# Patient Record
Sex: Male | Born: 1935 | Race: White | Hispanic: No | Smoking: Former smoker
Health system: Southern US, Community
[De-identification: ages and names within clinical notes are randomized; demographics above are authoritative.]

## PROBLEM LIST (undated history)

## (undated) DIAGNOSIS — IMO0001 Reserved for inherently not codable concepts without codable children: Secondary | ICD-10-CM

## (undated) DIAGNOSIS — M545 Low back pain, unspecified: Secondary | ICD-10-CM

## (undated) DIAGNOSIS — F419 Anxiety disorder, unspecified: Secondary | ICD-10-CM

## (undated) DIAGNOSIS — M549 Dorsalgia, unspecified: Secondary | ICD-10-CM

## (undated) DIAGNOSIS — R296 Repeated falls: Secondary | ICD-10-CM

## (undated) DIAGNOSIS — J189 Pneumonia, unspecified organism: Secondary | ICD-10-CM

## (undated) DIAGNOSIS — Z9289 Personal history of other medical treatment: Secondary | ICD-10-CM

## (undated) DIAGNOSIS — E119 Type 2 diabetes mellitus without complications: Secondary | ICD-10-CM

## (undated) DIAGNOSIS — Z7901 Long term (current) use of anticoagulants: Secondary | ICD-10-CM

## (undated) DIAGNOSIS — N183 Chronic kidney disease, stage 3 unspecified: Secondary | ICD-10-CM

## (undated) DIAGNOSIS — H9190 Unspecified hearing loss, unspecified ear: Secondary | ICD-10-CM

## (undated) DIAGNOSIS — R351 Nocturia: Secondary | ICD-10-CM

## (undated) DIAGNOSIS — E785 Hyperlipidemia, unspecified: Secondary | ICD-10-CM

## (undated) DIAGNOSIS — R202 Paresthesia of skin: Secondary | ICD-10-CM

## (undated) DIAGNOSIS — I2510 Atherosclerotic heart disease of native coronary artery without angina pectoris: Secondary | ICD-10-CM

## (undated) DIAGNOSIS — I480 Paroxysmal atrial fibrillation: Secondary | ICD-10-CM

## (undated) DIAGNOSIS — R3915 Urgency of urination: Secondary | ICD-10-CM

## (undated) DIAGNOSIS — J984 Other disorders of lung: Secondary | ICD-10-CM

## (undated) DIAGNOSIS — Z86718 Personal history of other venous thrombosis and embolism: Secondary | ICD-10-CM

## (undated) DIAGNOSIS — W19XXXA Unspecified fall, initial encounter: Secondary | ICD-10-CM

## (undated) DIAGNOSIS — R200 Anesthesia of skin: Secondary | ICD-10-CM

## (undated) DIAGNOSIS — C61 Malignant neoplasm of prostate: Secondary | ICD-10-CM

## (undated) DIAGNOSIS — M79605 Pain in left leg: Secondary | ICD-10-CM

## (undated) DIAGNOSIS — I340 Nonrheumatic mitral (valve) insufficiency: Secondary | ICD-10-CM

## (undated) DIAGNOSIS — R011 Cardiac murmur, unspecified: Secondary | ICD-10-CM

## (undated) DIAGNOSIS — L97909 Non-pressure chronic ulcer of unspecified part of unspecified lower leg with unspecified severity: Secondary | ICD-10-CM

## (undated) DIAGNOSIS — J449 Chronic obstructive pulmonary disease, unspecified: Secondary | ICD-10-CM

## (undated) DIAGNOSIS — M1990 Unspecified osteoarthritis, unspecified site: Secondary | ICD-10-CM

## (undated) DIAGNOSIS — K219 Gastro-esophageal reflux disease without esophagitis: Secondary | ICD-10-CM

## (undated) DIAGNOSIS — I739 Peripheral vascular disease, unspecified: Secondary | ICD-10-CM

## (undated) DIAGNOSIS — G8929 Other chronic pain: Secondary | ICD-10-CM

## (undated) DIAGNOSIS — J8410 Pulmonary fibrosis, unspecified: Secondary | ICD-10-CM

## (undated) DIAGNOSIS — M5430 Sciatica, unspecified side: Secondary | ICD-10-CM

## (undated) DIAGNOSIS — M546 Pain in thoracic spine: Secondary | ICD-10-CM

## (undated) HISTORY — PX: PROSTATE CRYOABLATION: SUR358

## (undated) HISTORY — DX: Chronic kidney disease, stage 3 unspecified: N18.30

## (undated) HISTORY — PX: CARDIAC CATHETERIZATION: SHX172

## (undated) HISTORY — DX: Personal history of other venous thrombosis and embolism: Z86.718

## (undated) HISTORY — DX: Repeated falls: R29.6

## (undated) HISTORY — DX: Chronic kidney disease, stage 3 (moderate): N18.3

## (undated) HISTORY — DX: Unspecified fall, initial encounter: W19.XXXA

## (undated) HISTORY — PX: CATARACT EXTRACTION W/ INTRAOCULAR LENS  IMPLANT, BILATERAL: SHX1307

## (undated) HISTORY — PX: CORONARY ANGIOPLASTY WITH STENT PLACEMENT: SHX49

---

## 1996-01-12 HISTORY — PX: CARDIAC CATHETERIZATION: SHX172

## 1998-03-31 ENCOUNTER — Encounter: Admission: RE | Admit: 1998-03-31 | Discharge: 1998-04-28 | Payer: Self-pay | Admitting: Internal Medicine

## 2001-02-01 ENCOUNTER — Encounter: Admission: RE | Admit: 2001-02-01 | Discharge: 2001-02-01 | Payer: Self-pay | Admitting: Cardiology

## 2001-02-01 ENCOUNTER — Encounter: Payer: Self-pay | Admitting: Cardiology

## 2001-02-07 ENCOUNTER — Ambulatory Visit (HOSPITAL_COMMUNITY): Admission: RE | Admit: 2001-02-07 | Discharge: 2001-02-07 | Payer: Self-pay | Admitting: Cardiology

## 2001-09-05 ENCOUNTER — Encounter: Payer: Self-pay | Admitting: Internal Medicine

## 2001-09-05 ENCOUNTER — Encounter: Admission: RE | Admit: 2001-09-05 | Discharge: 2001-09-05 | Payer: Self-pay | Admitting: Internal Medicine

## 2002-05-11 ENCOUNTER — Ambulatory Visit (HOSPITAL_COMMUNITY): Admission: RE | Admit: 2002-05-11 | Discharge: 2002-05-11 | Payer: Self-pay | Admitting: Gastroenterology

## 2002-05-11 ENCOUNTER — Encounter (INDEPENDENT_AMBULATORY_CARE_PROVIDER_SITE_OTHER): Payer: Self-pay | Admitting: Specialist

## 2003-01-28 ENCOUNTER — Encounter: Admission: RE | Admit: 2003-01-28 | Discharge: 2003-01-28 | Payer: Self-pay | Admitting: Internal Medicine

## 2003-08-12 ENCOUNTER — Encounter: Admission: RE | Admit: 2003-08-12 | Discharge: 2003-08-12 | Payer: Self-pay | Admitting: Cardiology

## 2004-04-09 ENCOUNTER — Ambulatory Visit: Admission: RE | Admit: 2004-04-09 | Discharge: 2004-04-21 | Payer: Self-pay | Admitting: Radiation Oncology

## 2004-06-03 ENCOUNTER — Ambulatory Visit: Admission: RE | Admit: 2004-06-03 | Discharge: 2004-09-11 | Payer: Self-pay | Admitting: Radiation Oncology

## 2004-08-03 ENCOUNTER — Ambulatory Visit (HOSPITAL_BASED_OUTPATIENT_CLINIC_OR_DEPARTMENT_OTHER): Admission: RE | Admit: 2004-08-03 | Discharge: 2004-08-03 | Payer: Self-pay | Admitting: Urology

## 2004-08-03 ENCOUNTER — Ambulatory Visit (HOSPITAL_COMMUNITY): Admission: RE | Admit: 2004-08-03 | Discharge: 2004-08-03 | Payer: Self-pay | Admitting: Urology

## 2004-08-03 HISTORY — PX: INSERTION PROSTATE RADIATION SEED: SUR718

## 2004-10-05 ENCOUNTER — Encounter: Admission: RE | Admit: 2004-10-05 | Discharge: 2004-10-05 | Payer: Self-pay | Admitting: Cardiology

## 2004-10-09 ENCOUNTER — Ambulatory Visit (HOSPITAL_COMMUNITY): Admission: RE | Admit: 2004-10-09 | Discharge: 2004-10-10 | Payer: Self-pay | Admitting: Cardiology

## 2005-07-07 ENCOUNTER — Inpatient Hospital Stay (HOSPITAL_COMMUNITY): Admission: AD | Admit: 2005-07-07 | Discharge: 2005-07-09 | Payer: Self-pay | Admitting: Cardiology

## 2005-09-15 ENCOUNTER — Ambulatory Visit (HOSPITAL_COMMUNITY): Admission: RE | Admit: 2005-09-15 | Discharge: 2005-09-15 | Payer: Self-pay | Admitting: Cardiology

## 2005-09-15 HISTORY — PX: CARDIOVERSION: SHX1299

## 2006-01-13 ENCOUNTER — Encounter: Admission: RE | Admit: 2006-01-13 | Discharge: 2006-01-13 | Payer: Self-pay | Admitting: Family Medicine

## 2006-01-20 ENCOUNTER — Encounter: Admission: RE | Admit: 2006-01-20 | Discharge: 2006-01-20 | Payer: Self-pay | Admitting: Family Medicine

## 2006-02-03 ENCOUNTER — Encounter: Admission: RE | Admit: 2006-02-03 | Discharge: 2006-02-03 | Payer: Self-pay | Admitting: Family Medicine

## 2006-02-17 ENCOUNTER — Encounter: Admission: RE | Admit: 2006-02-17 | Discharge: 2006-02-17 | Payer: Self-pay | Admitting: Family Medicine

## 2006-04-05 ENCOUNTER — Encounter: Admission: RE | Admit: 2006-04-05 | Discharge: 2006-04-05 | Payer: Self-pay | Admitting: Cardiology

## 2006-05-05 ENCOUNTER — Encounter: Admission: RE | Admit: 2006-05-05 | Discharge: 2006-05-05 | Payer: Self-pay | Admitting: Internal Medicine

## 2006-06-01 ENCOUNTER — Encounter: Admission: RE | Admit: 2006-06-01 | Discharge: 2006-06-01 | Payer: Self-pay | Admitting: Orthopedic Surgery

## 2007-06-14 ENCOUNTER — Ambulatory Visit (HOSPITAL_COMMUNITY): Admission: RE | Admit: 2007-06-14 | Discharge: 2007-06-14 | Payer: Self-pay | Admitting: Gastroenterology

## 2007-06-14 ENCOUNTER — Encounter (INDEPENDENT_AMBULATORY_CARE_PROVIDER_SITE_OTHER): Payer: Self-pay | Admitting: Gastroenterology

## 2007-07-26 HISTORY — PX: TRANSTHORACIC ECHOCARDIOGRAM: SHX275

## 2007-11-14 ENCOUNTER — Encounter: Admission: RE | Admit: 2007-11-14 | Discharge: 2007-11-14 | Payer: Self-pay | Admitting: Internal Medicine

## 2008-05-23 ENCOUNTER — Encounter: Admission: RE | Admit: 2008-05-23 | Discharge: 2008-05-23 | Payer: Self-pay | Admitting: Cardiology

## 2008-06-03 HISTORY — PX: CARDIOVASCULAR STRESS TEST: SHX262

## 2008-08-05 ENCOUNTER — Encounter: Admission: RE | Admit: 2008-08-05 | Discharge: 2008-08-05 | Payer: Self-pay | Admitting: Neurosurgery

## 2008-08-06 ENCOUNTER — Ambulatory Visit (HOSPITAL_COMMUNITY): Admission: RE | Admit: 2008-08-06 | Discharge: 2008-08-06 | Payer: Self-pay | Admitting: General Surgery

## 2008-08-06 ENCOUNTER — Encounter (INDEPENDENT_AMBULATORY_CARE_PROVIDER_SITE_OTHER): Payer: Self-pay | Admitting: General Surgery

## 2008-08-06 HISTORY — PX: OTHER SURGICAL HISTORY: SHX169

## 2008-10-28 ENCOUNTER — Ambulatory Visit (HOSPITAL_COMMUNITY): Admission: RE | Admit: 2008-10-28 | Discharge: 2008-10-28 | Payer: Self-pay | Admitting: Neurosurgery

## 2008-10-28 HISTORY — PX: LAMINECTOMY: SHX219

## 2010-03-03 ENCOUNTER — Other Ambulatory Visit: Payer: Self-pay | Admitting: Internal Medicine

## 2010-03-03 ENCOUNTER — Ambulatory Visit
Admission: RE | Admit: 2010-03-03 | Discharge: 2010-03-03 | Disposition: A | Discharge status: Home / Self Care | Payer: Medicare Other | Source: Ambulatory Visit | Attending: Internal Medicine | Admitting: Internal Medicine

## 2010-03-03 DIAGNOSIS — R0602 Shortness of breath: Secondary | ICD-10-CM

## 2010-04-16 LAB — BASIC METABOLIC PANEL
BUN: 14 mg/dL (ref 6–23)
CO2: 23 mEq/L (ref 19–32)
Chloride: 105 mEq/L (ref 96–112)
Glucose, Bld: 175 mg/dL — ABNORMAL HIGH (ref 70–99)
Sodium: 138 mEq/L (ref 135–145)

## 2010-04-16 LAB — DIFFERENTIAL
Basophils Relative: 1 % (ref 0–1)
Eosinophils Absolute: 0.2 10*3/uL (ref 0.0–0.7)
Lymphocytes Relative: 25 % (ref 12–46)
Neutro Abs: 4.7 10*3/uL (ref 1.7–7.7)

## 2010-04-16 LAB — CBC
HCT: 37.8 % — ABNORMAL LOW (ref 39.0–52.0)
RBC: 3.78 MIL/uL — ABNORMAL LOW (ref 4.22–5.81)
RDW: 12.7 % (ref 11.5–15.5)

## 2010-04-16 LAB — TYPE AND SCREEN
ABO/RH(D): A NEG
Antibody Screen: NEGATIVE

## 2010-04-16 LAB — PROTIME-INR
INR: 2.66 — ABNORMAL HIGH (ref 0.00–1.49)
Prothrombin Time: 28.1 seconds — ABNORMAL HIGH (ref 11.6–15.2)

## 2010-04-16 LAB — APTT: aPTT: 45 seconds — ABNORMAL HIGH (ref 24–37)

## 2010-04-16 LAB — ABO/RH: ABO/RH(D): A NEG

## 2010-04-19 LAB — COMPREHENSIVE METABOLIC PANEL
Alkaline Phosphatase: 74 U/L (ref 39–117)
BUN: 21 mg/dL (ref 6–23)
CO2: 24 mEq/L (ref 19–32)
Chloride: 107 mEq/L (ref 96–112)
GFR calc non Af Amer: 55 mL/min — ABNORMAL LOW (ref >60)
Glucose, Bld: 138 mg/dL — ABNORMAL HIGH (ref 70–99)
Potassium: 3.9 mEq/L (ref 3.5–5.1)
Total Bilirubin: 0.7 mg/dL (ref 0.3–1.2)

## 2010-04-19 LAB — CBC
HCT: 38.3 % — ABNORMAL LOW (ref 39.0–52.0)
Hemoglobin: 13.3 g/dL (ref 13.0–17.0)
MCV: 98.5 fL (ref 78.0–100.0)
WBC: 6.6 10*3/uL (ref 4.0–10.5)

## 2010-04-19 LAB — DIFFERENTIAL
Basophils Absolute: 0 10*3/uL (ref 0.0–0.1)
Basophils Relative: 0 % (ref 0–1)
Monocytes Absolute: 0.6 10*3/uL (ref 0.1–1.0)
Neutro Abs: 4.4 10*3/uL (ref 1.7–7.7)
Neutrophils Relative %: 66 % (ref 43–77)

## 2010-04-19 LAB — PROTIME-INR
INR: 2 — ABNORMAL HIGH (ref 0.00–1.49)
Prothrombin Time: 15 seconds (ref 11.6–15.2)
Prothrombin Time: 23.9 seconds — ABNORMAL HIGH (ref 11.6–15.2)

## 2010-05-26 NOTE — Op Note (Signed)
Greg Booker, Greg Booker                ACCOUNT NO.:  [id number]   MEDICAL RECORD NO.:  [id number]          PATIENT TYPE:  AMB   LOCATION:  DAY                          FACILITY:  [facility]   PHYSICIAN:  Adolph Pollack, M.D.DATE OF BIRTH:  05-22-1929   DATE OF PROCEDURE:  08/06/2008  DATE OF DISCHARGE:                               OPERATIVE REPORT   PREOPERATIVE DIAGNOSIS:  Left breast mass.   POSTOPERATIVE DIAGNOSIS:  Left breast mass.   PROCEDURE:  Excision of left breast mass (left partial mastectomy).   SURGEON:  Adolph Pollack, M.D.   ANESTHESIA:  Local (mixture of lidocaine and Marcaine) with MAC.   INDICATIONS:  [name] is a 77 year old male with a left breast mass.  Very likely it is gynecomastia as he had a similar area in his right  breast removed.  However, he does have a sister who has left breast  cancer.  The mass is painful and measures at least 3.3 cm.  It is based  in the superior to the aspect of the breast and the nipple areolar  complex.  He now presents for excision.  We discussed the procedure and  risks preoperatively.  He had been on Coumadin, but he had held this,  and his INR is normal.   TECHNIQUE:  He was seen in the holding area and the left chest wall  marked with my initials.  He was then brought to the operating room,  placed supine on the operating table and intravenous sedation was given.  The hair on the left chest was clipped, and the left chest/breast was  sterilely prepped and draped.  Local anesthetic was infiltrated in the  circumareolar area beginning at approximately the 7 o'clock position and  going clockwise in the 5 o'clock position and radially from the nipple  areolar complex.  I then made circumareolar incision beginning at about  8 o'clock position and going around to the 4 o'clock position.  I raised  skin flaps superiorly and laterally.  I then began dissecting breast  tissue off of the chest wall with electrocautery.  I then  separated  breast tissue from the nipple areolar complex.  I took this down to the  chest wall as well.  I then excised the rest of the mass  off of the  chest wall using electrocautery.  This turned into a partial mastectomy.  I excised normal tissue around the area of the mass.   The specimen sent to pathology.  The wound was inspected, and bleeding  points were controlled with electrocautery.  Once hemostasis was  adequate, I reapproximated the subcutaneous tissues with interrupted 3-0  Vicryl suture.  The skin was closed with a 4-0 Monocryl running  subcuticular stitch.  Steri-Strips and sterile dressings were applied.   He tolerated the procedure well without any apparent complications and  was taken to recovery in satisfactory condition.      Adolph Pollack, M.D.  Electronically Signed     TJR/MEDQ  D:  08/06/2008  T:  08/06/2008  Job:  [id number]

## 2010-05-26 NOTE — Op Note (Signed)
NAME:  Greg Booker, Greg Booker                ACCOUNT NO.:  [id number]   MEDICAL RECORD NO.:  [id number]          PATIENT TYPE:  AMB   LOCATION:  ENDO                         FACILITY:  [facility]   PHYSICIAN:  Anselmo Rod, M.D.  DATE OF BIRTH:  1929-01-16   DATE OF PROCEDURE:  06/14/2007  DATE OF DISCHARGE:                               OPERATIVE REPORT   PROCEDURE PERFORMED:  Colonoscopy with multiple cold biopsies.   ENDOSCOPIST:  Anselmo Rod, M.D.   INSTRUMENT USED:  Pentax video colonoscope.   INDICATIONS FOR PROCEDURE:  A 77 year old white male with multiple  medical problems undergoing colonoscopy.  The patient has a history of  adenomatous polyps removed in 2004, rule out recurrent polyps, masses,  etc.   PREPROCEDURE PREPARATION:  Informed consent was procured from the  patient.  The patient was fasted for 8 hours prior to the procedure and  prepped with a bottle of magnesium citrate and a gallon of NuLYTELY the  night prior to the procedure.  The patient's Coumadin was held for a  week prior to the procedure after procuring permission from his  cardiologist, Dr. Julieanne Manson, to do so.  Risks and benefits of the  procedure including a 10% miss rate of cancer and polyp were discussed  with the patient as well.   PREPROCEDURE PHYSICAL:  VITAL SIGNS:  The patient had stable vital  signs.  The patient had irregular regular rhythm and has a history of  atrial fibrillation.  NECK:  Supple.  CHEST:  Clear to auscultation.  CARDIAC:  S1 and S2 irregular.  ABDOMEN:  Soft with normal bowel sounds.   DESCRIPTION OF PROCEDURE:  The patient was placed in the left lateral  decubitus position and sedated with 75 mcg of Fentanyl and 6 mg of  Versed given intravenously in slow incremental doses. Once the patient  was adequately sedated and maintained on low-flow oxygen, the Pentax  video colonoscope was advanced from the rectum to the cecum and terminal  ileum without difficulty.  There  was some residual stool in the colon.  Multiple washes were done.  Three small sessile polyps were removed by  cold biopsies from the cecum and the appendiceal orifice and ileocecal  valve were clearly visualized and photographed.  The terminal ileum  appeared healthy and without lesions.  Two small sessile polyps were  removed by cold biopsies from the mid right colon.  The transverse colon  and the left colon appeared normal as did the rectosigmoid colon and the  rectum.  Retroflexion in the rectum revealed small internal hemorrhoids.  The patient tolerated the procedure well without immediate  complications.   IMPRESSION:  1. Three small sessile polyps removed by cold biopsies from the cecum      and two small sessile polyps removed by cold biopsies from the mid      right colon.  2. Normal terminal ileum.  3. No evidence of diverticulosis.  4. Small internal hemorrhoids seen on retroflexion.   RECOMMENDATIONS:  1. Await pathology results.  2. Repeat colonoscopy depending on pathology results.  3.  Avoid Coumadin for the next one week and restart Coumadin      thereafter, on day 8.  4. Continue high fiber diet with liberal fluid intake.  5. Outpatient follow-up as need arises in the future.      Anselmo Rod, M.D.  Electronically Signed     JNM/MEDQ  D:  06/14/2007  T:  06/14/2007  Job:  [id number]   cc:   Merlene Laughter. Renae Gloss, M.D.  Fax: [phone]   Thereasa Solo. Little, M.D.  Fax: [phone]

## 2010-05-29 NOTE — Op Note (Signed)
NAME:  CHASON, MCIVER                          ACCOUNT NO.:  [id number]   MEDICAL RECORD NO.:  [id number]                   PATIENT TYPE:  AMB   LOCATION:  ENDO                                 FACILITY:  [facility]   PHYSICIAN:  Anselmo Rod, M.D.               DATE OF BIRTH:  1929/10/11   DATE OF PROCEDURE:  05/11/2002  DATE OF DISCHARGE:                                 OPERATIVE REPORT   PROCEDURE:  Colonoscopy with snare polypectomy x2.   ENDOSCOPIST:  Anselmo Rod, M.D.   INSTRUMENT USED:  Olympus video colonoscope.   INDICATION FOR PROCEDURE:  A 77 year old white gentleman with a history of  guaiac-positive stools.  Rule out colonic polyps, masses, etc.   PREPROCEDURE PREPARATION:  Informed consent was procured from the patient.  The patient fasted for eight hours prior to the procedure and prepped with a  bottle of magnesium citrate and a gallon of GoLYTELY the night prior to the  procedure.   PREPROCEDURE PHYSICAL:  VITAL SIGNS:  The patient had stable vital signs.  NECK:  Supple.  CHEST:  Clear to auscultation.  S1, S2 regular.  ABDOMEN:  Soft with normal bowel sounds.   DESCRIPTION OF PROCEDURE:  The patient was placed in the left lateral  decubitus position and sedated with 60 mg of Demerol and 6 mg of Versed  intravenously.  Once the patient was adequately sedate and maintained on low-  flow oxygen and continuous cardiac monitoring, the Olympus video colonoscope  was advanced from the rectum to the cecum with difficulty.  There was a  large amount of residual stool in the right colon.  Multiple washes were  done, the patient's position was changed from the left lateral to the supine  position to adequately visualize the cecal base.  In spite of that, there  was some residual stool in the cecum.  A small sessile polyp was snared from  the cecum that was lost in stool and could not be retrieved.  A 5-6 mm  sessile polyp was snared from the proximal right colon and  was retrieved.  There was streaky erythema with bleeding in the right colon, probably from  application of pressure while advancing the scope from the left colon to the  right colon; however, no large masses or polyps were seen.  Retroflexion in  the rectum revealed small, nonbleeding internal hemorrhoids.   IMPRESSION:  1. Small, nonbleeding internal hemorrhoid.  2. Small sessile polyp snared from cecum (lost in stool).  3. A 5-6 mm sessile polyp snared from the proximal right colon.  4. Streaky erythema with minimal hemorrhage in the right colon from possible     application of pressure during advancement of the scope.    RECOMMENDATIONS:  1. Await pathology results.  2. Avoid all nonsteroidals, including aspirin, for the next four weeks.  3. Outpatient follow-up in the next two weeks for  further recommendations.                                               Anselmo Rod, M.D.    JNM/MEDQ  D:  05/11/2002  T:  05/11/2002  Job:  [id number]   cc:   Merlene Laughter. Renae Gloss, M.D.  [address]  [address]  [place]  [place] [address]  Fax: [phone]

## 2010-05-29 NOTE — Cardiovascular Report (Signed)
Greg Booker, Greg Booker                ACCOUNT NO.:  [id number]   MEDICAL RECORD NO.:  [id number]          PATIENT TYPE:  OIB   LOCATION:  2854                         FACILITY:  [facility]   PHYSICIAN:  Thereasa Solo. Little, M.D. DATE OF BIRTH:  [birth year]   DATE OF PROCEDURE:  10/09/2004  DATE OF DISCHARGE:                              CARDIAC CATHETERIZATION   INDICATIONS FOR TEST:  This 77 year old male has a history of coronary  disease dating back to 1997; at that time, a stent was placed into his RCA.  He has chronic paroxysmal atrial fibrillation, but has been in a sinus  rhythm lately.   He was diagnosed with prostate cancer the first of 2006 and presented to my  office on September 24, 2004 complaining of increasing tiredness and  fatigue, no stamina.  His EKG shows more lateral T wave changes and because  of these complaints associated with EKG changes, he is brought in for  outpatient cardiac catheterization.  He stopped his Coumadin as an  outpatient.   DESCRIPTION OF PROCEDURE:  After obtaining informed consent, the patient was  prepped and draped in the usual sterile fashion, exposing the right groin.  Following local anesthetic with 1% Xylocaine, the Seldinger technique was  employed and a 5-French introducer sheath was placed into the right femoral  artery.  Left and right coronary arteriography and ventriculography in the  RAO projection were performed.   COMPLICATIONS:  None.   EQUIPMENT:  The 5-French Judkins configurations.  It should be pointed out  that the x-ray equipment before the ventriculogram and immediately after the  ventriculogram went down as result to what appears to be a power surge.  The data was retrieved.   TOTAL CONTRAST:  110 mL.   HEMODYNAMIC MONITORING:  Central aortic pressure was 168/76.  Left  ventricular pressure was 167/8.  At the time of pullback. there was no  significant gradient noted.   VENTRICULOGRAPHY:  Ventriculography in the RAO  projection revealed an  ejection fraction of approximately 50%.  There was posterior basilar segment  hypokinesis; +2 mitral regurgitation was seen.  The left ventricular end-  diastolic pressure was 13.   CORONARY ARTERIOGRAPHY:  On fluoroscopy, dense calcification was seen in the  proximal portion of the LAD and in the left main.  1.  Left main normal; it bifurcated.  2.  LAD:  The LAD extended down and crossed the apex of the heart.  It gave      rise to a large left-to-right collateral bed.  The proximal LAD had      minor irregularities of less than 40%.  The ostium of the first diagonal      had minor narrowing.  The remainder showed no significant disease.  3.  Circumflex:  The circumflex gave rise to 1 large OM vessel and the OM      vessel itself was free of disease.  Proximal to the OM vessel was a      focal area of narrowing that was less than 50%.  4.  Right coronary artery:  On fluoroscopy, a  stent was noted in what would      be the midportion of the RCA.  The vessel, however, was occluded 100%      proximally.  There were left-to-right collaterals filling the entire      posterior descending artery.   CONCLUSION:  1.  Ejection fraction 50% with posterobasilar hypokinesis.  2.  Mitral regurgitation, +2.  3.  One hundred percent occlusion of the right coronary artery with mild      disease in both the left anterior descending and circumflex, but no high-      grade flow-limiting obstructions.   I cannot clearly explain his fatigue based on his catheterization.  There  are no high-grade stenoses and large collaterals to the right coronary  artery should not substantially affect his stamina.  His left ventricular  function at 50% should be adequate.  I plan to add low-dose ACE inhibitors  to his current medications and because his paroxysmal atrial fibrillation,  it was plan to restart his Coumadin on October 10, 2004.  He will be  checked in the office on October 12, 2004 and at that time the ACE inhibitor  will be started (lisinopril 10 mg).           ______________________________  Thereasa Solo Little, M.D.     ABL/MEDQ  D:  10/09/2004  T:  10/09/2004  Job:  [id number]   cc:   Merlene Laughter. Renae Gloss, M.D.  Fax: [phone]   [facility]

## 2010-05-29 NOTE — Cardiovascular Report (Signed)
[facility]  Patient:    Greg Booker, Greg Booker Visit Number: [id number] MRN: [id number]          Service Type: CAT Location: [facility] 2855 [id number] Attending Physician:  Loreli Dollar Dictated by:   Julieanne Manson, M.D. Proc. Date: 02/07/01 Admit Date:  02/07/2001   CC:         Cala Bradford R. Renae Gloss, M.D.             Cardiac Catheterization Lab                        Cardiac Catheterization  INDICATIONS:  [name] is a 77 year old male who had a stent placed to his RCA in 1997, and had in-stent restenosis and basically subtotal occlusion of the entire RCA (documented by catheterization in 1998).  He has had intermittent paroxysmal atrial fibrillation and of recent has had more atrial fibrillation.  He underwent a stress Cardiolite study because of increasing dyspnea on exertion, that showed inferior ischemia; because of this is brought to the catheterization lab for outpatient cardiac catheterization.  DESCRIPTION OF PROCEDURE:  The patient was prepped and draped in the usual sterile fashion, exposing the right groin.  Following local anesthetic with 1% Xylocaine, the Seldinger technique was employed and a 5-French introducer sheath was placed into the right femoral artery.  Selective left and right coronary arteriography and ventriculography in the RAO projection was performed.  RESULTS:  HEMODYNAMIC MONITORING: 1. Central aortic pressure:  163/69. 2. Left ventricular pressure:  163/53, with no significant aortic valve    gradient noted at time of pullback.  VENTRICULOGRAPHY:  Performed in the RAO projection, revealed normal left ventricular systolic function.  There was mitral regurgitation -- mild.  The end-diastolic pressure was 20.  CORONARY ARTERIOGRAPHY:  On fluoroscopy there was dense calcification in the left main and proximal LAD. 1. LEFT MAIN:  Normal. 2. LEFT ANTERIOR DESCENDING:  Had mild to moderate proximal irregularities,    all  less than 50%.  The distal vessel was small, with mild irregularities.    There was a small first diagonal branch. 3. CIRCUMFLEX:  Gave rise to one large OM vessel, with mild irregularities.    The AV groove circumflex was small. 4. RIGHT CORONARY ARTERY:  Had a long 80-90% area of obstruction from the mid    to the distal portion of the vessel, with bidirectional flow of the distal    portion of the vessel.  There was a large left-to-right collateral bed    noted.  ASSESSMENT:  In comparison to the 1998 cardiac catheterization, the RCA has not changed.  There is slightly worsening irregularities in the LAD, but nothing that is high grade and that warrants intervention at this time.Dictated by:   Julieanne Manson, M.D. Attending Physician:  Loreli Dollar DD:  02/07/01 TD:  02/07/01 Job: [id number]

## 2010-05-29 NOTE — Op Note (Signed)
Greg Booker, Greg Booker                ACCOUNT NO.:  [id number]   MEDICAL RECORD NO.:  [id number]          PATIENT TYPE:  AMB   LOCATION:  [facility]                         FACILITY:  [facility]   PHYSICIAN:  Bertram Millard. Dahlstedt, M.D.DATE OF BIRTH:  [birth year]   DATE OF PROCEDURE:  08/03/2004  DATE OF DISCHARGE:                                 OPERATIVE REPORT   PREOPERATIVE DIAGNOSIS:  Adenocarcinoma the prostate,  clinical stage TIC,  Gleason score 4+4.   POSTOPERATIVE DIAGNOSIS:  Adenocarcinoma the prostate,  clinical stage TIC,  Gleason score 4+4.   PROCEDURE:  Placement of I-125 seeds within prostate.   SURGEON:  Bertram Millard. Dahlstedt, M.D.   RADIATION ONCOLOGIST:  Artist Pais. Kathrynn Running, M.D.   ANESTHESIA:  General.   COMPLICATIONS:  None.   TOTAL NUMBER OF NEEDLES:  19   TOTAL NUMBER OF SEEDS:  61.   ACTIVITY:  0.443 mCi per seed.   BRIEF HISTORY:  This 77 year old gentleman presents at this time for  brachytherapy. He was diagnosed this spring with adenocarcinoma of the  prostate. He had a PSA that rose from 3.2 to over 7 in a period of a year.  Biopsy was performed which revealed a Gleason's 4+4, and 40% of bilateral  biopsies were positive for adenocarcinoma.   He has received [facility] agonist, and has completed a course of external beam  radiotherapy as part of his combination treatment. He presents at this time  for completion of radiation therapy with brachytherapy. He is aware of the  risks and complications of the procedure including but not limited to  retention, blood in the urine, blood in the stool, secondary malignancies,  among others. He consented to proceed.   DESCRIPTION OF PROCEDURE:  The patient was administered preoperative IV  antibiotics and was taken to the operating room where general anesthetic was  administered. He was placed in the dorsal lithotomy position. The Foley  catheter was placed in the bladder with contrast in the balloon. His  perineum was then  prepped and draped. The transrectal ultrasound probe was  then introduced. The prostate was scanned by the Nucletron device. The grid  was placed against the patient's perineum, hooked to the Nucletron device.  Fixation needles were placed in prechosen locations. The urethra and rectum  were carefully measured, and the prostate also measured with the program.  Urethra and rectum were omitted from the treatment. Based on the planned  grid designed by the software, the prostate seeds were then placed. Again,  19 needles were used to deliver 61 seeds using ultrasonographic guidance.  Following placement of all seeds, the catheter was removed. Cystoscopic view  of the urethra and bladder revealed no seeds evident. At this point a Foley  catheter was replaced.   At this point, the procedure was terminated. The patient was awakened and  taken to PACU in stable condition.   He will follow with doctors Kathrynn Running and Dahlstedt in three weeks. He was  discharged on Keflex 500 mg one p.o. t.i.d. for 5 days and Vicodin one p.o.  q.4 h p.r.n. pain. He was given  instructions on catheter removal.       SMD/MEDQ  D:  08/03/2004  T:  08/03/2004  Job:  [id number]   cc:   Merlene Laughter. Renae Gloss, M.D.  [address]  [address]  [place]  [place] [address]  Fax: [phone]   Artist Pais. Kathrynn Running, M.D.  [address] [facility]  [place]  [place] [address]  Fax: [phone]

## 2010-05-29 NOTE — Discharge Summary (Signed)
Greg Booker, Greg Booker                ACCOUNT NO.:  [id number]   MEDICAL RECORD NO.:  [id number]          PATIENT TYPE:  INP   LOCATION:  2018                         FACILITY:  [facility]   PHYSICIAN:  Thereasa Solo. Little, M.D. DATE OF BIRTH:  1929/07/25   DATE OF ADMISSION:  07/07/2005  DATE OF DISCHARGE:  07/09/2005                                 DISCHARGE SUMMARY   DISCHARGE DIAGNOSES:  1.  Paroxysmal atrial fibrillation, admitted for institution of sotalol      therapy, now in sinus rhythm at the time of discharge.  2.  Coumadin anticoagulation therapy - therapeutic.  3.  Known coronary artery disease, with last catheterization in September      2006 with normal ejection fraction.  4.  Hypertension.  5.  Hyperlipidemia with STATIN intolerance.   HPI/HOSPITAL COURSE:  This is a 77 year old gentleman, patient of Dr.  Clarene Duke, who has known history of paroxysmal atrial fibrillation that dates  back at least 15 years, and he was taking quinidine for rhythm control and  had only occasional breakthrough episodes, but last Saturday prior to his  admission the patient experienced severe episode of atrial palpitations when  he had felt like his heart beating exceptionally fast, and he had some chest  discomfort with radiation to both arms, which required taking nitroglycerin.  Eventually, the pain dissipated when his rate subsided.   Dr. Clarene Duke admitted the patient to [facility] for initiation of  sotalol therapy.  His Coumadin was discontinued, and we started him on  sotalol 40 mg twice a day.   His INR remained therapeutic.  At the time of discharge, was 2.3.  Digitalis  level was 2.5.  Hemoglobin 11.4, hematocrit 33.3, white blood cell count  5.5, platelets 196.  Sodium 138, potassium 4.6, BUN 25, creatinine 1.4.   On the day of discharge, Dr. Clarene Duke saw patient, and he was stable from  cardiovascular standpoint, maintained sinus rhythm.  Vital signs were  normal.   DISCHARGE  MEDICATIONS:  1.  Lisinopril 5 mg daily.  2.  Zoloft 50 mg daily.  3.  Coumadin 5 mg daily, except 2.5 Monday, Wednesday, Friday.  4.  Imdur 60 mg daily.  5.  Protonix 40 mg daily.  6.  Flomax 0.4 mg daily.  7.  Betapace 40 mg b.i.d.   DISCHARGE INSTRUCTIONS:  The patient will have blood work to check pro time,  INR in 2 weeks after discharge, and he was also instructed to discontinue  Lanoxin.   DISCHARGE FOLLOWUP:  Dr. Clarene Duke will see patient in the office on July 30, 2005, at 12 p.m.      Raymon Mutton, P.A.    ______________________________  Thereasa Solo. Little, M.D.    MK/MEDQ  D:  07/09/2005  T:  07/09/2005  Job:  [id number]   cc:   Merlene Laughter. Renae Gloss, M.D.  Fax: [phone]

## 2010-05-29 NOTE — Cardiovascular Report (Signed)
Greg Booker, Greg Booker                ACCOUNT NO.:  [id number]   MEDICAL RECORD NO.:  [id number]          PATIENT TYPE:  OIB   LOCATION:  2899                         FACILITY:  [facility]   PHYSICIAN:  Thereasa Solo. Little, M.D. DATE OF BIRTH:  1929-11-28   DATE OF PROCEDURE:  09/15/2005  DATE OF DISCHARGE:                              CARDIAC CATHETERIZATION   CARDIOVERSION NOTE:   HISTORY OF PRESENT ILLNESS:  This 77 year old male has a history of PAF.  He  had been on quinidine for years and was brought in in June and switched over  from quinidine to Betapace.  On the Betapace, he felt dramatically better,  but in July presented back in atrial fibrillation.  He has maintained a  therapeutic INR and has had well-controlled heart rates of less than 100 on  Betapace.  He is currently on 80 mg a day.  He has an INR of 3.8 and a  potassium of 4.1.  His left atrial dimension on an echo 2 years ago was 5.5,  making the chances of cardioversion being successful approximately 50/50.   After obtaining informed consent, the patient was brought to [facility]  for outpatient elective cardioversion.  After receiving 325 mg of IV  Pentothal by anesthesia, an appropriate level of anesthesia was obtained.  He was initially cardioverted with 100 watt seconds.  He went from atrial  fibrillation to sinus rhythm for about a minute and then converted back into  atrial fibrillation.  Three additional attempts at cardioversion using 200  watt seconds were unsuccessful.  We even repositioned the pads in an attempt  to have better success in conversion.  Despite this, he was never able to  maintain sinus rhythm and is in atrial fibrillation after the end of the  procedure.   He will be discharged home later this morning, and I will plan to see him  later this week in the office, and his medications will remain the same.   CONCLUSION:  Unsuccessful elective cardioversion.     ______________________________  Thereasa Solo Little, M.D.     ABL/MEDQ  D:  09/15/2005  T:  09/15/2005  Job:  [id number]   cc:   Merlene Laughter. Renae Gloss, M.D.

## 2010-08-20 ENCOUNTER — Other Ambulatory Visit: Payer: Self-pay | Admitting: Neurosurgery

## 2010-08-20 DIAGNOSIS — M545 Low back pain: Secondary | ICD-10-CM

## 2010-08-21 ENCOUNTER — Ambulatory Visit
Admission: RE | Admit: 2010-08-21 | Discharge: 2010-08-21 | Disposition: A | Discharge status: Home / Self Care | Payer: Medicare Other | Source: Ambulatory Visit | Attending: Neurosurgery | Admitting: Neurosurgery

## 2010-08-21 DIAGNOSIS — M545 Low back pain: Secondary | ICD-10-CM

## 2010-08-21 MED ORDER — GADOBENATE DIMEGLUMINE 529 MG/ML IV SOLN
18.0000 mL | Freq: Once | INTRAVENOUS | Status: AC | PRN
  Administered 2010-08-21: 18 mL via INTRAVENOUS

## 2010-12-22 ENCOUNTER — Other Ambulatory Visit: Payer: Self-pay | Admitting: Urology

## 2010-12-22 DIAGNOSIS — C61 Malignant neoplasm of prostate: Secondary | ICD-10-CM

## 2011-01-01 ENCOUNTER — Encounter (HOSPITAL_COMMUNITY)
Admission: RE | Admit: 2011-01-01 | Discharge: 2011-01-01 | Disposition: A | Discharge status: Home / Self Care | Payer: Medicare Other | Source: Ambulatory Visit | Attending: Urology | Admitting: Urology

## 2011-01-01 DIAGNOSIS — C61 Malignant neoplasm of prostate: Secondary | ICD-10-CM | POA: Insufficient documentation

## 2011-01-01 MED ORDER — TECHNETIUM TC 99M MEDRONATE IV KIT
26.0000 | PACK | Freq: Once | INTRAVENOUS | Status: AC | PRN
  Administered 2011-01-01: 26 via INTRAVENOUS

## 2011-02-01 ENCOUNTER — Other Ambulatory Visit: Payer: Self-pay | Admitting: Urology

## 2011-02-23 ENCOUNTER — Encounter (HOSPITAL_BASED_OUTPATIENT_CLINIC_OR_DEPARTMENT_OTHER): Payer: Self-pay | Admitting: *Deleted

## 2011-02-23 NOTE — Progress Notes (Signed)
NPO AFTER MN. ARRIVES AT 0830. NEEDS PT/PTT, HH, BMET.  CURRENT EKG, NOTE, STRESS TEST AND ECHO TO BE FAXED FROM DR AL LITTLE.  CURRENT CXR 03-03-2010 IN EPIC AND IN CHART. WILL TAKE METOPROLOL, ZANTAC, IMDUR, DILTIAZEM AM OF SURG. W/ SIPS OF WATER. PT TO DO LABS ON Friday 02-26-11 AT [facility]. STOPPED COUMADIN 02-22-11.

## 2011-02-26 ENCOUNTER — Encounter (HOSPITAL_BASED_OUTPATIENT_CLINIC_OR_DEPARTMENT_OTHER): Payer: Self-pay | Admitting: *Deleted

## 2011-02-26 LAB — PROTIME-INR: Prothrombin Time: 16.3 seconds — ABNORMAL HIGH (ref 11.6–15.2)

## 2011-02-26 LAB — BASIC METABOLIC PANEL
BUN: 33 mg/dL — ABNORMAL HIGH (ref 6–23)
Calcium: 9.4 mg/dL (ref 8.4–10.5)
Creatinine, Ser: 1.53 mg/dL — ABNORMAL HIGH (ref 0.50–1.35)
GFR calc non Af Amer: 42 mL/min — ABNORMAL LOW (ref >90)
Glucose, Bld: 147 mg/dL — ABNORMAL HIGH (ref 70–99)
Potassium: 4.3 mEq/L (ref 3.5–5.1)

## 2011-02-26 NOTE — Progress Notes (Signed)
Labs done at [facility]  Results pending

## 2011-03-01 ENCOUNTER — Encounter (HOSPITAL_BASED_OUTPATIENT_CLINIC_OR_DEPARTMENT_OTHER): Payer: Self-pay | Admitting: *Deleted

## 2011-03-01 ENCOUNTER — Encounter (HOSPITAL_BASED_OUTPATIENT_CLINIC_OR_DEPARTMENT_OTHER): Payer: Self-pay | Admitting: Anesthesiology

## 2011-03-01 ENCOUNTER — Ambulatory Visit (HOSPITAL_BASED_OUTPATIENT_CLINIC_OR_DEPARTMENT_OTHER)
Admission: RE | Admit: 2011-03-01 | Discharge: 2011-03-01 | Disposition: A | Discharge status: Home / Self Care | Payer: Medicare Other | Source: Ambulatory Visit | Attending: Urology | Admitting: Urology

## 2011-03-01 ENCOUNTER — Encounter (HOSPITAL_BASED_OUTPATIENT_CLINIC_OR_DEPARTMENT_OTHER)
Admission: RE | Disposition: A | Discharge status: Home / Self Care | Payer: Self-pay | Source: Ambulatory Visit | Attending: Urology

## 2011-03-01 ENCOUNTER — Ambulatory Visit (HOSPITAL_BASED_OUTPATIENT_CLINIC_OR_DEPARTMENT_OTHER): Payer: Medicare Other | Admitting: Anesthesiology

## 2011-03-01 DIAGNOSIS — C61 Malignant neoplasm of prostate: Secondary | ICD-10-CM | POA: Diagnosis present

## 2011-03-01 DIAGNOSIS — Z79899 Other long term (current) drug therapy: Secondary | ICD-10-CM | POA: Insufficient documentation

## 2011-03-01 DIAGNOSIS — Z7901 Long term (current) use of anticoagulants: Secondary | ICD-10-CM | POA: Insufficient documentation

## 2011-03-01 DIAGNOSIS — Z01812 Encounter for preprocedural laboratory examination: Secondary | ICD-10-CM | POA: Insufficient documentation

## 2011-03-01 DIAGNOSIS — I519 Heart disease, unspecified: Secondary | ICD-10-CM | POA: Insufficient documentation

## 2011-03-01 DIAGNOSIS — K219 Gastro-esophageal reflux disease without esophagitis: Secondary | ICD-10-CM | POA: Insufficient documentation

## 2011-03-01 DIAGNOSIS — G4730 Sleep apnea, unspecified: Secondary | ICD-10-CM | POA: Insufficient documentation

## 2011-03-01 DIAGNOSIS — N529 Male erectile dysfunction, unspecified: Secondary | ICD-10-CM | POA: Insufficient documentation

## 2011-03-01 HISTORY — DX: Reserved for inherently not codable concepts without codable children: IMO0001

## 2011-03-01 HISTORY — DX: Long term (current) use of anticoagulants: Z79.01

## 2011-03-01 HISTORY — DX: Low back pain: M54.5

## 2011-03-01 HISTORY — DX: Paresthesia of skin: R20.2

## 2011-03-01 HISTORY — PX: CRYOABLATION: SHX1415

## 2011-03-01 HISTORY — DX: Hyperlipidemia, unspecified: E78.5

## 2011-03-01 HISTORY — DX: Unspecified hearing loss, unspecified ear: H91.90

## 2011-03-01 HISTORY — DX: Nonrheumatic mitral (valve) insufficiency: I34.0

## 2011-03-01 HISTORY — DX: Paroxysmal atrial fibrillation: I48.0

## 2011-03-01 HISTORY — DX: Nocturia: R35.1

## 2011-03-01 HISTORY — DX: Pulmonary fibrosis, unspecified: J84.10

## 2011-03-01 HISTORY — DX: Pain in left leg: M79.605

## 2011-03-01 HISTORY — DX: Gastro-esophageal reflux disease without esophagitis: K21.9

## 2011-03-01 HISTORY — DX: Atherosclerotic heart disease of native coronary artery without angina pectoris: I25.10

## 2011-03-01 HISTORY — DX: Sciatica, unspecified side: M54.30

## 2011-03-01 HISTORY — DX: Paresthesia of skin: R20.0

## 2011-03-01 HISTORY — DX: Low back pain, unspecified: M54.50

## 2011-03-01 HISTORY — DX: Malignant neoplasm of prostate: C61

## 2011-03-01 HISTORY — DX: Other disorders of lung: J98.4

## 2011-03-01 SURGERY — CRYOABLATION, PROSTATE
Anesthesia: General | Site: Prostate | Wound class: Clean Contaminated

## 2011-03-01 MED ORDER — ONDANSETRON HCL 4 MG/2ML IJ SOLN
INTRAMUSCULAR | Status: DC | PRN
  Administered 2011-03-01: 4 mg via INTRAVENOUS

## 2011-03-01 MED ORDER — GLYCOPYRROLATE 0.2 MG/ML IJ SOLN
INTRAMUSCULAR | Status: DC | PRN
  Administered 2011-03-01: 0.2 mg via INTRAVENOUS

## 2011-03-01 MED ORDER — PROPOFOL 10 MG/ML IV EMUL
INTRAVENOUS | Status: DC | PRN
  Administered 2011-03-01: 50 mg via INTRAVENOUS
  Administered 2011-03-01: 200 mg via INTRAVENOUS

## 2011-03-01 MED ORDER — FENTANYL CITRATE 0.05 MG/ML IJ SOLN: 25.0000 ug | INTRAMUSCULAR | Status: DC | PRN

## 2011-03-01 MED ORDER — STERILE WATER FOR IRRIGATION IR SOLN
Status: DC | PRN
  Administered 2011-03-01: 6000 mL

## 2011-03-01 MED ORDER — DEXAMETHASONE SODIUM PHOSPHATE 4 MG/ML IJ SOLN
INTRAMUSCULAR | Status: DC | PRN
  Administered 2011-03-01: 4 mg via INTRAVENOUS

## 2011-03-01 MED ORDER — ACETAMINOPHEN 10 MG/ML IV SOLN
1000.0000 mg | Freq: Four times a day (QID) | INTRAVENOUS | Status: DC
  Administered 2011-03-01: 1000 mg via INTRAVENOUS

## 2011-03-01 MED ORDER — CIPROFLOXACIN HCL 500 MG PO TABS: 500.0000 mg | ORAL_TABLET | Freq: Two times a day (BID) | ORAL | Status: DC

## 2011-03-01 MED ORDER — BELLADONNA ALKALOIDS-OPIUM 16.2-60 MG RE SUPP
RECTAL | Status: DC | PRN
Start: 2011-03-01 — End: 2011-03-01
  Administered 2011-03-01: 1 via RECTAL

## 2011-03-01 MED ORDER — CEFAZOLIN SODIUM 1-5 GM-% IV SOLN
INTRAVENOUS | Status: DC | PRN
  Administered 2011-03-01: 2 g via INTRAVENOUS

## 2011-03-01 MED ORDER — CEFAZOLIN SODIUM-DEXTROSE 2-3 GM-% IV SOLR: 2.0000 g | INTRAVENOUS | Status: DC

## 2011-03-01 MED ORDER — TRIMETHOPRIM 100 MG PO TABS: 100.0000 mg | ORAL_TABLET | ORAL | Status: AC

## 2011-03-01 MED ORDER — KETOROLAC TROMETHAMINE 30 MG/ML IJ SOLN
INTRAMUSCULAR | Status: DC | PRN
  Administered 2011-03-01: 30 mg via INTRAVENOUS

## 2011-03-01 MED ORDER — URELLE 81 MG PO TABS: 1.0000 | ORAL_TABLET | Freq: Three times a day (TID) | ORAL | Status: DC

## 2011-03-01 MED ORDER — FENTANYL CITRATE 0.05 MG/ML IJ SOLN
INTRAMUSCULAR | Status: DC | PRN
  Administered 2011-03-01: 50 ug via INTRAVENOUS
  Administered 2011-03-01: 25 ug via INTRAVENOUS
  Administered 2011-03-01 (×2): 50 ug via INTRAVENOUS
  Administered 2011-03-01 (×9): 25 ug via INTRAVENOUS

## 2011-03-01 MED ORDER — LIDOCAINE HCL (CARDIAC) 20 MG/ML IV SOLN
INTRAVENOUS | Status: DC | PRN
  Administered 2011-03-01: 80 mg via INTRAVENOUS

## 2011-03-01 MED ORDER — LACTATED RINGERS IV SOLN
INTRAVENOUS | Status: DC | PRN
  Administered 2011-03-01 (×2): via INTRAVENOUS

## 2011-03-01 MED ORDER — PROMETHAZINE HCL 25 MG/ML IJ SOLN: 6.2500 mg | INTRAMUSCULAR | Status: DC | PRN

## 2011-03-01 MED ORDER — CEFAZOLIN SODIUM 1-5 GM-% IV SOLN: 1.0000 g | INTRAVENOUS | Status: DC

## 2011-03-01 MED ORDER — LACTATED RINGERS IV SOLN
INTRAVENOUS | Status: DC
  Administered 2011-03-01: 100 mL/h via INTRAVENOUS

## 2011-03-01 MED ORDER — HYDROCODONE-ACETAMINOPHEN 7.5-650 MG PO TABS: 1.0000 | ORAL_TABLET | Freq: Four times a day (QID) | ORAL | Status: DC | PRN

## 2011-03-01 SURGICAL SUPPLY — 37 items
BAG DRN ANRFLXCHMBR STRAP LEK (BAG)
BAG URINE DRAINAGE (UROLOGICAL SUPPLIES) IMPLANT
BAG URINE LEG 19OZ MD ST LTX (BAG) IMPLANT
BANDAGE CONFORM 3  STR LF (GAUZE/BANDAGES/DRESSINGS) IMPLANT
BLADE SURG ROTATE 9660 (MISCELLANEOUS) ×2 IMPLANT
BOOTIES KNEE HIGH SLOAN (MISCELLANEOUS) ×1 IMPLANT
CANISTER SUCTION 2500CC (MISCELLANEOUS) IMPLANT
CATH FOLEY 2WAY SLVR  5CC 18FR (CATHETERS) ×1
CATH FOLEY 2WAY SLVR 5CC 18FR (CATHETERS) ×1 IMPLANT
CHARGE TECH PROCEDURE ONCURA (LABOR (TRAVEL & OVERTIME)) ×2 IMPLANT
CLOTH BEACON ORANGE TIMEOUT ST (SAFETY) ×2 IMPLANT
COVER MAYO STAND STRL (DRAPES) ×2 IMPLANT
DRAPE CAMERA CLOSED 9X96 (DRAPES) ×2 IMPLANT
DRAPE INCISE IOBAN 66X45 STRL (DRAPES) ×2 IMPLANT
DRAPE UNDERBUTTOCKS STRL (DRAPE) ×2 IMPLANT
DRSG TEGADERM 4X4.75 (GAUZE/BANDAGES/DRESSINGS) ×2 IMPLANT
DRSG TEGADERM 8X12 (GAUZE/BANDAGES/DRESSINGS) ×2 IMPLANT
GAS ARGON HIGH PRESSURE (MEDICAL GASES) ×2 IMPLANT
GAS HELIUM HIGH PRESSURE (MEDICAL GASES) ×1 IMPLANT
GLOVE BIO SURGEON STRL SZ7.5 (GLOVE) ×4 IMPLANT
GLOVE BIOGEL M 6.5 STRL (GLOVE) ×1 IMPLANT
GLOVE ECLIPSE 6.0 STRL STRAW (GLOVE) ×2 IMPLANT
GOWN SURGICAL LARGE (GOWNS) ×1 IMPLANT
GOWN SURGICAL XLG (GOWNS) ×1 IMPLANT
GUIDEWIRE SUPER STIFF (WIRE) ×2 IMPLANT
HOLDER FOLEY CATH W/STRAP (MISCELLANEOUS) ×2 IMPLANT
ICEROD HE-THAW PROSTATE CRYOABLATION KIT ×1 IMPLANT
KIT PROSTATE PRESICE I (KITS) IMPLANT
NDL SPNL 18GX3.5 QUINCKE PK (NEEDLE) IMPLANT
NEEDLE SPNL 18GX3.5 QUINCKE PK (NEEDLE) IMPLANT
PACK CYSTOSCOPY (CUSTOM PROCEDURE TRAY) ×2 IMPLANT
PLUG CATH AND CAP STER (CATHETERS) ×2 IMPLANT
SPONGE GAUZE 4X4 12PLY (GAUZE/BANDAGES/DRESSINGS) ×1 IMPLANT
SYRINGE 10CC LL (SYRINGE) ×2 IMPLANT
SYRINGE IRR TOOMEY STRL 70CC (SYRINGE) IMPLANT
UNDERPAD 30X30 INCONTINENT (UNDERPADS AND DIAPERS) ×2 IMPLANT
WATER STERILE IRR 500ML POUR (IV SOLUTION) ×2 IMPLANT

## 2011-03-01 NOTE — Anesthesia Postprocedure Evaluation (Signed)
  Anesthesia Post-op Note  Patient: Greg Booker  Procedure(s) Performed: Procedure(s) (LRB): CRYO ABLATION PROSTATE (N/A)  Patient Location: PACU  Anesthesia Type: General  Level of Consciousness: awake and alert   Airway and Oxygen Therapy: Patient Spontanous Breathing  Post-op Pain: mild  Post-op Assessment: Post-op Vital signs reviewed, Patient's Cardiovascular Status Stable, Respiratory Function Stable, Patent Airway and No signs of Nausea or vomiting  Post-op Vital Signs: stable  Complications: No apparent anesthesia complications

## 2011-03-01 NOTE — Op Note (Signed)
Pre-operative diagnosis: Localized, Castrate-resistantT2 a, Gleason 7 adenocarcinoma prostate, recurrent,  post I-125 seed Rx and hormone therapy.   Postop diagnosis: Same  Operation: Cryotherapy of T2a recurrent prostate cancer, now CRPC,post radiation and hormone therapy.  Surgeon: Doshia Dalia  Anesthesia: LMA  Complications: None  Preparation: After appropriate preanesthesia, the patient was brought to the operating room, and placed upon the operating table in the dorsal supine position, where general LMA anesthesia was introduced. He was then replaced in the dorsal lithotomy position, where the pubis was prepped with Betadine solution and draped in usual fashion. Foley catheter was placed, and the scrotum was taped to the abdominal wall. "Timeout" was observed. The patient had been given IV antibiotic,  and IV Toradol. History: 78 yo male post I-125 seed therapy in 2006 for stage IIa, Nx Mx CaP, with pretreatment PSA=7.1, and Gleason grade 4+4=8.  He had PSA recurrence 2 yrs later, and was treated with Trelstar, complicated by ED. Currently, he has psa 1.21, PUS=42cc gland, and Pbx showing recurrent Gleason 4+3=7 prostate cancer, in L apex biopsies. Cysto negative, and bone scan and CT evaluation negative. He was counseled regarding treatment alternatives, and has chosen cryotherapy for treatment of his recurrent, CRPC.   Procedure: The patient underwent 2 cycles of cryotherapy, with active thaw on the first freeze/thaw cycle, and passive thaw on the second freeze/thaw cycle. Cystoscopy at the beginning of the procedure was accomplished, and showed normal urethra, with no evidence of needles within the prostate, the urethra, or the bladder. The prostate was 2+ enlarged, measuring 41 cc by ultrasound volume. The bladder neck was normal. The bladder had no trabeculation, no cellules, no bladder stones, tumors, or diverticula. Clear reflux was seen from both cortices. A guidewire was placed in the  bladder, and the cystoscope removed, and a urethral warming catheter was placed, and left in place for the entire procedure, and not removed for 20 minutes after the second fall was begun. At that point, the catheter was removed, and a Foley catheter was placed to straight drainage. The patient was given IV Toradol at the end of the procedure. He was awakened and taken to recovery room in good condition. He was given a B&O suppository.

## 2011-03-01 NOTE — Transfer of Care (Signed)
Immediate Anesthesia Transfer of Care Note Immediate Anesthesia Transfer of Care Note  Patient: Greg Booker  Procedure(s) Performed: Procedure(s) (LRB): CRYO ABLATION PROSTATE (N/A)  Patient Location: PACU  Anesthesia Type: General  Level of Consciousness: awake, sedated, patient cooperative and responds to stimulation  Airway & Oxygen Therapy: Patient Spontanous Breathing and Patient connected to face mask oxygen  Post-op Assessment: Report given to PACU RN, Post -op Vital signs reviewed and stable and Patient moving all extremities  Post vital signs: Reviewed and stable  Complications: No apparent anesthesia complications

## 2011-03-01 NOTE — Anesthesia Procedure Notes (Signed)
Procedure Name: LMA Insertion Date/Time: 03/01/2011 10:08 AM Performed by: Iline Oven Pre-anesthesia Checklist: Patient identified, Emergency Drugs available, Suction available and Patient being monitored Patient Re-evaluated:Patient Re-evaluated prior to inductionOxygen Delivery Method: Circle System Utilized Preoxygenation: Pre-oxygenation with 100% oxygen Intubation Type: IV induction Ventilation: Mask ventilation without difficulty LMA: LMA inserted LMA Size: 4.0 Number of attempts: 1 Airway Equipment and Method: bite block Placement Confirmation: positive ETCO2 Tube secured with: Tape Dental Injury: Teeth and Oropharynx as per pre-operative assessment

## 2011-03-01 NOTE — Discharge Instructions (Addendum)
Brachytherapy for Prostate Cancer. Cryotherapy also.  Brachytherapy for prostate cancer is radiation treatment. It uses ultrasound to guide the insertion of tiny radioactive seeds into the prostate. This allows additional radiation to be given to the tumor and less radiation to the surrounding normal tissues. The seeds are about the thickness of a pencil lead and just over ? inch long. Brachytherapy is associated with fewer side effects than external beam radiation therapy alone. Usually, there is a delay of about 2 months after the seed implants before external radiation is begun, if external radiation is also used. Cryotherapy freezed the prostatic tissue, while not affecting the urethra. It is often used for recurrent prostate cancer which fails Radiation. LET YOUR CAREGIVER KNOW ABOUT:   Any allergies.   All medicines you are taking including vitamins, herbs, eyedrops, and over-the-counter medicines and creams.   Use of steroids (through mouth or as creams).   Previous problems with medicines that make a person sleep (general anesthetics) or numbing medicines.   History of bleeding or blood problems.   Previous surgery.   Any health problems.  RISKS AND COMPLICATIONS   Erectile dysfunction (impotence).   Bleeding.   Infection.   Reaction to anesthesia.   Involuntary leakage of urine (urinary incontinence).  BEFORE THE PROCEDURE  Ask your caregiver about changing or stopping your regular medicines. PROCEDURE   The procedure is performed under a general or spinal anesthetic.   An ultrasound is used to guide the long, thin metal tubes into the prostate gland. These tubes help place the seeds.   The metal tubes are then removed.   The seeds can be left in place permanently.  AFTER THE PROCEDURE   You will have a catheter in your bladder. The catheter will be removed later in your urologist's office.   Avoid being around children and pregnant women. Over time, the radiation  will decrease and become inactive.   X-rays and CT scans may be taken to show the location of the seeds.   A specialist can calculate the exact amount of radiation received.  What to expect:  A little blood in the urine for a couple days is normal.   Your urine stream may be weaker for a couple weeks.  Document Released: 06/07/2005 Document Revised: 09/09/2010 Document Reviewed: 06/21/2005 [employer] Patient Information 2012 Greg Booker Anesthetic, Adult A doctor specialized in giving anesthesia (anesthesiologist) or a nurse specialized in giving anesthesia (nurse anesthetist) gives medicine that makes you sleep while a procedure is performed (general anesthetic). Once the general anesthetic has been administered, you will be in a sleeplike state in which you feel no pain. After having a general anestheticyou may feel:   Dizzy.   Weak.   Drowsy.   Confused.  These feelings are normal and can be expected to last for up to 24 hours after the procedure is completed.  LET YOUR CAREGIVER KNOW ABOUT:  Allergies you have.   Medications you are taking, including herbs, eye drops, over the counter medications, dietary supplements, and creams.   Previous problems you have had with anesthetics or numbing medicines.   Use of cigarettes, alcohol, or illicit drugs.   Possibility of pregnancy, if this applies.   History of bleeding or blood disorders, including blood clots and clotting disorders.   Previous surgeries you have had and types of anesthetics you have received.   Family medical history, especially anesthetic problems.   Other health problems.  BEFORE THE PROCEDURE  You may brush your teeth  on the morning of surgery but you should have no solid food or non-clear liquids for a minimum of 8 hours prior to your procedure. Clear liquids (water, apple juice, black coffee, and tea) are acceptable in small amounts until 2 hours prior to your procedure.   You may take  your regular medications the morning of your procedure unless your caregiver indicates otherwise.  AFTER THE PROCEDURE  After surgery, you will be taken to the recovery area where a nurse will monitor your progress. You will be allowed to go home when you are awake, stable, taking fluids well, and without serious pain or complications.   For the first 24 hours following an anesthetic:   Have a responsible person with you.   Do not drive a car. If you are alone, do not take public transportation.   Do not engage in strenuous activity. You may usually resume normal activities the next day, or as advised by your caregiver.   Do not drink alcohol.   Do not take medicine that has not been prescribed by your caregiver.   Do not sign important papers or make important decisions as your judgement may be impaired.   You may resume a normal diet as directed.   Change bandages (dressings) as directed.   Only take over-the-counter or prescription medicines for pain, discomfort, or fever as directed by your caregiver.  If you have questions or problems that seem related to the anesthetic, call the hospital and ask for the anesthetist, anesthesiologist, or anesthesia department. SEEK IMMEDIATE MEDICAL CARE IF:   You develop a rash.   You have difficulty breathing.   You have chest pain.   You have allergic problems.   You have uncontrolled nausea.   You have uncontrolled vomiting.   You develop any serious bleeding, especially from the incision site.  Document Released: 04/06/2007 Document Revised: 09/09/2010 Document Reviewed: 04/30/2010 [facility] Patient Information 2012 [place], [place].

## 2011-03-01 NOTE — H&P (Signed)
Active Problems Problems  1. Balanitis 607.1 2. 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 Prostate Cancer 185  History of Present Illness  78 yo married male, patient of Dr. Lenoria Chime,. With recent prostate biopsy on 12/14/10 with pathology showing Gleason 4+3 involving 5% of one core in the Lt apex area.  All other areas showed BPH with radiation atypia.  Patient had a CT & Bone scan on 01/01/11 which both were negative for mets. PUS=42.57cc gland.  10/29/10  PSA - 1.21, 06/19/10  PSA - 0.75 and 03/26/09  PSA - 0.06  He was originally diagnosed in 2006. He had a clinical stage TII A. NX MX, with a pretreatment PSA of 7.1. Gleason score 4+4. He was treated with combination therapy of I-125 seeds on 08/03/04 and 2 years of Trelstar , with his last injection being in January of 2008.  He has moderate erectile dysfunction. He used Levitra last year, but it really didn't help. He is not really interested in more these medications.             Past Medical History Problems  1. History of  Adult Sleep Apnea 780.57 2. History of  Anxiety (Symptom) 300.00 3. History of  Arthritis V13.4 4. History of  Atrial Fibrillation 427.31 5. History of  Cath Stent Placement 6. History of  Esophageal Reflux 530.81 7. History of  Heart Disease 429.9 8. History of  Reported Prior Back Trouble V13.5  Surgical History Problems  1. History of  Back Surgery 2. History of  Colonoscopy (Fiberoptic) 3. History of  Knee Surgery 4. History of  Prostate Surgery  Current Meds 1. Acetaminophen 500 MG Oral Tablet; Therapy: (Recorded:18Oct2012) to 2. Coumadin 5 MG Oral Tablet; Therapy: (Recorded:17Jan2008) to 3. Digoxin 0.125 MG Oral Tablet; Therapy: (Recorded:18Oct2012) to 4. Diltiazem HCl ER Coated Beads 240 MG Oral Capsule Extended Release 24 Hour; Therapy:  (Recorded:18Oct2012) to 5. Fish Oil CAPS; Therapy: (Recorded:18Oct2012) to 6. Furosemide 40 MG Oral Tablet; Therapy: (Recorded:18Oct2012)  to 7. Glucosamine Chondr 1500 Complx CAPS; Therapy: (Recorded:18Oct2012) to 8. Isosorbide Mononitrate ER 60 MG Oral Tablet Extended Release 24 Hour; Therapy:  (Recorded:18Oct2012) to 9. Lisinopril 10 MG Oral Tablet; Therapy: (Recorded:18Oct2012) to 10. LORazepam 0.5 MG Oral Tablet; Therapy: (Recorded:18Oct2012) to 11. Metoprolol Succinate ER 50 MG Oral Tablet Extended Release 24 Hour; Therapy:   (Recorded:18Oct2012) to 12. Niacin 500 MG Oral Tablet; Therapy: (Recorded:18Oct2012) to 13. Super B Complex TABS; Therapy: (Recorded:18Oct2012) to 14. Tamsulosin HCl 0.4 MG Oral Capsule; Therapy: (Recorded:18Oct2012) to 15. Vitamin C TABS; Therapy: (Recorded:12May2010) to 16. Vitamin D TABS; Therapy: (Recorded:12May2010) to  Allergies Medication  1. Codeine Derivatives  Family History Problems  1. Paternal history of  Death In The Family Father Heart ProblemsAge of death was 108 2. Maternal history of  Death In The Family Mother Lenn Cal of death was 74 3. Paternal history of  Reported Previous Cardiac Problems  Social History Problems  1. Caffeine Use 4 cups 2. Former Smoker V15.82 smoked 1 ppd for 25 yrs & quit in 1976 3. Marital History - Widowed 4. Retired From Work Denied  5. History of  Alcohol Use  Review of Systems Genitourinary, constitutional, skin, eye, otolaryngeal, hematologic/lymphatic, cardiovascular, pulmonary, endocrine, musculoskeletal, gastrointestinal, neurological and psychiatric system(s) were reviewed and pertinent findings if present are noted.    Vitals Vital Signs [Data Includes: Last 1 Day]  25Jan2013 11:21AM  Blood Pressure: 109 / 60 Temperature: 97.9 F Heart Rate: 92  Results/Data  05 Feb 2011 10:14  AM   UA With REFLEX       COLOR YELLOW       APPEARANCE CLEAR       SPECIFIC GRAVITY 1.010       pH 5.0       GLUCOSE NEG       BILIRUBIN NEG       KETONE NEG       BLOOD NEG       PROTEIN NEG       UROBILINOGEN 0.2       NITRITE NEG        LEUKOCYTE ESTERASE NEG      Procedure  PVR - 75.92cc   Procedure: Cystoscopy  Indication: Lower Urinary Tract Symptoms. Prostate cancer.  Informed Consent: Risks, benefits, and potential adverse events were discussed and informed consent was obtained from the patient.  Prep: The patient was prepped with betadine.  Anesthesia:. Local anesthesia was administered intraurethrally with 2% lidocaine jelly.  Antibiotic prophylaxis: Ciprofloxacin.  Procedure Note:  Urethral meatus:. No abnormalities.  Anterior urethra: No abnormalities.  Prostatic urethra: No abnormalities.  Bladder: Visulization was clear. The ureteral orifices were in the normal anatomic position bilaterally and had clear efflux of urine. A systematic survey of the bladder demonstrated no bladder tumors or stones. The mucosa was smooth without abnormalities. The patient tolerated the procedure well.  Complications: None.    Antibiotic prophylaxis: Ciprofloxacin     Assessment Assessed  1. 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 Prostate Cancer 185   Recurrent CaP, with cysto showing no median lobe. He is a good candidate for cryotherapy.   Plan  Prostate Cancer (185)  1. Complex Uroflowmetry  Done: 25Jan2013 2. Cysto  Done: 25Jan2013 3. PVR U/S  Done: 25Jan2013  UA With REFLEX  Status: Resulted - Requires Verification  Done: 01Jan0001 12:00AM Ordered Today; For: Health Maintenance (V70.0); Ordered By: Jethro Bolus  Due: 27Jan2013 Marked Important; Last Updated By: Marlene Bast   Amendment  Plan for surgery: cryotherapy.  Amended By: Jethro Bolus; 02/19/2011 4:07 PMEST   Signatures Electronically signed by : Jethro Bolus, M.D.; Feb 19 2011  4:07PM

## 2011-03-01 NOTE — Addendum Note (Signed)
Addendum  created 03/01/11 1227 by Iline Oven, CRNA   Modules edited:Anesthesia Medication Administration

## 2011-03-01 NOTE — Addendum Note (Signed)
Addendum  created 03/01/11 1227 by Nicoya Friel, CRNA   Modules edited:Anesthesia Medication Administration    

## 2011-03-01 NOTE — Interval H&P Note (Signed)
History and Physical Interval Note:  03/01/2011 9:52 AM  Greg Booker  has presented today for surgery, with the diagnosis of PROSTATE CANCER  The various methods of treatment have been discussed with the patient and family. After consideration of risks, benefits and other options for treatment, the patient has consented to  Procedure(s) (LRB): CRYO ABLATION PROSTATE (N/A) as a surgical intervention .  The patients' history has been reviewed, patient examined, no change in status, stable for surgery.  I have reviewed the patients' chart and labs.  Questions were answered to the patient's satisfaction.     Jethro Bolus I

## 2011-03-01 NOTE — Anesthesia Preprocedure Evaluation (Addendum)
Anesthesia Evaluation  Patient identified by MRN, date of birth, ID band Patient awake    Reviewed: Allergy & Precautions, H&P , NPO status , Patient's Chart, lab work & pertinent test results  Airway Mallampati: II TM Distance: <3 FB Neck ROM: Full    Dental No notable dental hx.    Pulmonary neg pulmonary ROS,  clear to auscultation  Pulmonary exam normal       Cardiovascular + CAD and + Cardiac Stents + dysrhythmias Atrial Fibrillation Irregular Normal    Neuro/Psych Negative Neurological ROS  Negative Psych ROS   GI/Hepatic Neg liver ROS, GERD-  Medicated,  Endo/Other  Negative Endocrine ROS  Renal/GU negative Renal ROS  Genitourinary negative   Musculoskeletal negative musculoskeletal ROS (+)   Abdominal   Peds negative pediatric ROS (+)  Hematology negative hematology ROS (+)   Anesthesia Other Findings   Reproductive/Obstetrics negative OB ROS                          Anesthesia Physical Anesthesia Plan  ASA: III  Anesthesia Plan: General   Post-op Pain Management:    Induction: Intravenous  Airway Management Planned: LMA  Additional Equipment:   Intra-op Plan:   Post-operative Plan:   Informed Consent: I have reviewed the patients History and Physical, chart, labs and discussed the procedure including the risks, benefits and alternatives for the proposed anesthesia with the patient or authorized representative who has indicated his/her understanding and acceptance.   Dental advisory given  Plan Discussed with: CRNA  Anesthesia Plan Comments:         Anesthesia Quick Evaluation

## 2011-03-01 NOTE — Progress Notes (Signed)
DENTURES RETURNED TO PT 

## 2011-03-01 NOTE — Addendum Note (Signed)
Addendum  created 03/01/11 1217 by Riesa Pope, MD   Modules edited:Orders, PRL Based Order Sets

## 2011-03-08 ENCOUNTER — Ambulatory Visit (HOSPITAL_COMMUNITY)
Admission: RE | Admit: 2011-03-08 | Discharge: 2011-03-08 | Disposition: A | Discharge status: Home / Self Care | Payer: Medicare Other | Source: Ambulatory Visit | Attending: Urology | Admitting: Urology

## 2011-03-08 ENCOUNTER — Encounter (HOSPITAL_COMMUNITY): Payer: Self-pay | Admitting: *Deleted

## 2011-03-08 ENCOUNTER — Emergency Department (HOSPITAL_COMMUNITY)
Admission: EM | Admit: 2011-03-08 | Discharge: 2011-03-08 | Disposition: A | Discharge status: Home Health | Payer: Medicare Other | Attending: Emergency Medicine | Admitting: Emergency Medicine

## 2011-03-08 DIAGNOSIS — M79609 Pain in unspecified limb: Secondary | ICD-10-CM | POA: Insufficient documentation

## 2011-03-08 DIAGNOSIS — Z9889 Other specified postprocedural states: Secondary | ICD-10-CM | POA: Insufficient documentation

## 2011-03-08 DIAGNOSIS — K219 Gastro-esophageal reflux disease without esophagitis: Secondary | ICD-10-CM | POA: Insufficient documentation

## 2011-03-08 DIAGNOSIS — Z7901 Long term (current) use of anticoagulants: Secondary | ICD-10-CM | POA: Insufficient documentation

## 2011-03-08 DIAGNOSIS — R52 Pain, unspecified: Secondary | ICD-10-CM

## 2011-03-08 DIAGNOSIS — Z8546 Personal history of malignant neoplasm of prostate: Secondary | ICD-10-CM | POA: Insufficient documentation

## 2011-03-08 DIAGNOSIS — I4891 Unspecified atrial fibrillation: Secondary | ICD-10-CM | POA: Insufficient documentation

## 2011-03-08 DIAGNOSIS — I2510 Atherosclerotic heart disease of native coronary artery without angina pectoris: Secondary | ICD-10-CM | POA: Insufficient documentation

## 2011-03-08 DIAGNOSIS — Z79899 Other long term (current) drug therapy: Secondary | ICD-10-CM | POA: Insufficient documentation

## 2011-03-08 DIAGNOSIS — I82409 Acute embolism and thrombosis of unspecified deep veins of unspecified lower extremity: Secondary | ICD-10-CM

## 2011-03-08 DIAGNOSIS — E785 Hyperlipidemia, unspecified: Secondary | ICD-10-CM | POA: Insufficient documentation

## 2011-03-08 LAB — PROTIME-INR
INR: 1.67 — ABNORMAL HIGH (ref 0.00–1.49)
Prothrombin Time: 20 seconds — ABNORMAL HIGH (ref 11.6–15.2)

## 2011-03-08 MED ORDER — TRAMADOL HCL 50 MG PO TABS: 50.0000 mg | ORAL_TABLET | Freq: Four times a day (QID) | ORAL | Status: AC | PRN

## 2011-03-08 MED ORDER — ENOXAPARIN SODIUM 100 MG/ML ~~LOC~~ SOLN: 80.0000 mg | Freq: Two times a day (BID) | SUBCUTANEOUS | Status: DC

## 2011-03-08 MED ORDER — ENOXAPARIN SODIUM 80 MG/0.8ML ~~LOC~~ SOLN
80.0000 mg | Freq: Once | SUBCUTANEOUS | Status: AC
  Administered 2011-03-08: 80 mg via SUBCUTANEOUS
  Filled 2011-03-08: qty 0.8

## 2011-03-08 NOTE — ED Provider Notes (Signed)
Medical screening examination/treatment/procedure(s) were performed by non-physician practitioner and as supervising physician I was immediately available for consultation/collaboration.   Gwyneth Sprout, MD 03/08/11 [phone]

## 2011-03-08 NOTE — Progress Notes (Signed)
Left lower extremity venous duplex completed.  Preliminary report is positive for DVT in the mid posterior calf.

## 2011-03-08 NOTE — ED Provider Notes (Addendum)
History     CSN: [id number]  Arrival date & time 03/08/11  1538   First MD Initiated Contact with Patient 03/08/11 1617      Chief Complaint  Patient presents with  . DVT    (Consider location/radiation/quality/duration/timing/severity/associated sxs/prior treatment) HPI History provided by pt.   Pt had a prostate procedure 7 days ago.  Had to hold his coumadin, which he takes for atrial fibrillation, for 5 days and restarted it the day after the procedure.  Has been compliant with it since then. Has had mild, non-radiating pain in his left calf since the procedure and acutely worsened 2 days ago.  No associated edema, chest pain or shortness of breath.  No trauma.  Pt was sent for venous duplex this morning and positive for DVT of unknown vein at left mid-calf.  Referred to ED by his PCP for treatment because there were no available appointments.    Past Medical History  Diagnosis Date  . GERD (gastroesophageal reflux disease)   . Coronary artery disease CARDIOLOGIST - DR LITTLE-- LAST VISIT 2 MON AGO    DENIES S & S  . Hyperlipidemia   . PAF (paroxysmal atrial fibrillation)   . S/P primary angioplasty with coronary stent   . Calcified granuloma of lung RIGHT LUNG BASE - STABLE PER CXR 03-03-2010  . Mitral regurgitation   . Chronic back pain   . Sciatic nerve pain LEFT LEG PAIN--  EPI INJECTIONS  . Low back pain radiating to left leg   . Numbness and tingling of left leg   . Impaired hearing BILATERAL AIDS  . Nocturia   . Prostate cancer RECUR    S/P RADIATIVE SEEDS 2006  . Anticoagulated on Coumadin     Past Surgical History  Procedure Date  . Laminectomy 10-28-2008    L3 - 4  . Excision benign breast mass 08-06-2008    LEFT  . Cardioversion 09-15-2005    UNSUCCESSFUL  . Radioactive seed implant 08-03-2004    PROSTATE  . Coronary angioplasty with stent placement 1997- DR AL LITTLE    X1 STENT RCA  . Cardiac catheterization 1998    IN-STENT RESTENOSIS-- LARGE  COLLATERALS  . Cardiac catheterization 2003  &  2006    2006 ---  MITRAL REGURG/ 100% OCCULSION RCA WITH LARGE COLLATERALS/ MILD DISEASE LAD & CIRCUMFLEX  . Cardiovascular stress test 06-03-2008    NORMAL STUDY/ LV NORMAL / COMPARED TO 12-26-2000 INFERIOR WALL ISCHEMIA IS NO LONGER PRESENT.  . Transthoracic echocardiogram 07-26-2007    LVSF NORMAL/ EF 55%/ LEFT ATRIUM 4.5CM MILD TO MOD. DILATED/ MILD  MITRAL & TRICUSPID REGURG    History reviewed. No pertinent family history.  History  Substance Use Topics  . Smoking status: Former Smoker -- 25 years    Types: Cigarettes, Pipe    Quit date: 02/23/1972  . Smokeless tobacco: Never Used  . Alcohol Use: No      Review of Systems  All other systems reviewed and are negative.    Allergies  Lovastatin and Codeine  Home Medications   Current Outpatient Rx  Name Route Sig Dispense Refill  . ACETAMINOPHEN 500 MG PO TABS Oral Take 500 mg by mouth every 6 (six) hours as needed.    . B COMPLEX PO TABS Oral Take 1 tablet by mouth daily.    Marland Kitchen VITAMIN D3 1000 UNITS PO CAPS Oral Take 1 capsule by mouth daily.    Marland Kitchen CIPROFLOXACIN HCL 500 MG PO TABS Oral Take  500 mg by mouth 2 (two) times daily. Start date 03/01/11-03/11/11    . DIGOXIN 0.125 MG PO TABS Oral Take 125 mcg by mouth every evening.    Marland Kitchen DILTIAZEM HCL ER 240 MG PO CP24 Oral Take 240 mg by mouth every morning.    Marland Kitchen OMEGA-3 FATTY ACIDS 1000 MG PO CAPS Oral Take 1 g by mouth daily.    . FUROSEMIDE 40 MG PO TABS Oral Take 40 mg by mouth daily.    Marland Kitchen GLUCOSAMINE-CHONDROITIN 500-400 MG PO TABS Oral Take 1 tablet by mouth daily.    Marland Kitchen HYDROCODONE-ACETAMINOPHEN 7.5-650 MG PO TABS Oral Take 1 tablet by mouth every 6 (six) hours as needed. For pain    . ISOSORBIDE MONONITRATE ER 60 MG PO TB24 Oral Take 60 mg by mouth every morning.    Marland Kitchen LISINOPRIL 10 MG PO TABS Oral Take 10 mg by mouth daily.    Marland Kitchen METOPROLOL SUCCINATE ER 50 MG PO TB24 Oral Take 50 mg by mouth every morning. Take with or  immediately following a meal.    . NIACIN 500 MG PO TABS Oral Take 500 mg by mouth daily with breakfast.    . ZANTAC PO Oral Take by mouth every morning.    Marland Kitchen TAMSULOSIN HCL 0.4 MG PO CAPS Oral Take 0.4 mg by mouth daily after supper.    Marland Kitchen TRIMETHOPRIM 100 MG PO TABS Oral Take 1 tablet (100 mg total) by mouth 1 day or 1 dose. 30 tablet 1  . URELLE 81 MG PO TABS Oral Take 1 tablet (81 mg total) by mouth 3 (three) times daily. 30 each 2  . VITAMIN C 500 MG PO TABS Oral Take 500 mg by mouth daily.    . WARFARIN SODIUM 5 MG PO TABS Oral Take 5 mg by mouth every evening.      BP 118/55  Pulse 77  Temp(Src) 97.4 F (36.3 C) (Oral)  Resp 18  SpO2 96%  Physical Exam  Nursing note and vitals reviewed. Constitutional: He is oriented to person, place, and time. He appears well-developed and well-nourished. No distress.  HENT:  Head: Normocephalic and atraumatic.  Eyes:       Normal appearance  Neck: Normal range of motion.  Cardiovascular: Normal rate and intact distal pulses.        Irregular rhythm  Pulmonary/Chest: Effort normal and breath sounds normal. No respiratory distress.  Musculoskeletal:       LLE w/out edema or skin changes.  Mild tenderness of left calf.   Neurological: He is alert and oriented to person, place, and time.  Skin: Skin is warm and dry. No rash noted.  Psychiatric: He has a normal mood and affect. His behavior is normal.    ED Course  Procedures (including critical care time)  Labs Reviewed  PROTIME-INR - Abnormal; Notable for the following:    Prothrombin Time 20.0 (*)    INR 1.67 (*)    All other components within normal limits   No results found.   1. DVT (deep venous thrombosis)       MDM  Pt referred to ED for treatment of DVT LLE diagnosed via venous duplex this morning.  He is on chronic coumadin therapy but was off of medication for 5 days for recent prostate procedure.  No s/sx concerning for PE.  INR 1.67 today.  Consulted Bayard Hugger,  Pharm D and she recommends that pt continue coumadin but take 1mg /kg/d of lovenox until INR therapeutic.  Pt  received 80mg  lovenox in ED and d/c'd home w/ prescription for same.  Return precautions discussed.         Otilio Miu, PA 03/08/11 1841  Otilio Miu, PA 03/08/11 [phone]

## 2011-03-08 NOTE — ED Notes (Signed)
Pt sent from vascular lab for treatment of dvt. Pt states his left lower leg has been painful for the past 2 days.

## 2011-03-10 ENCOUNTER — Encounter (HOSPITAL_BASED_OUTPATIENT_CLINIC_OR_DEPARTMENT_OTHER): Payer: Self-pay | Admitting: Urology

## 2011-03-11 NOTE — ED Provider Notes (Signed)
Medical screening examination/treatment/procedure(s) were performed by non-physician practitioner and as supervising physician I was immediately available for consultation/collaboration.   Gwyneth Sprout, MD 03/11/11 [phone]

## 2011-07-30 ENCOUNTER — Other Ambulatory Visit: Payer: Self-pay | Admitting: Orthopaedic Surgery

## 2011-07-30 DIAGNOSIS — M549 Dorsalgia, unspecified: Secondary | ICD-10-CM

## 2011-08-05 ENCOUNTER — Ambulatory Visit
Admission: RE | Admit: 2011-08-05 | Discharge: 2011-08-05 | Disposition: A | Discharge status: Home / Self Care | Payer: Medicare Other | Source: Ambulatory Visit | Attending: Orthopaedic Surgery | Admitting: Orthopaedic Surgery

## 2011-08-05 DIAGNOSIS — M549 Dorsalgia, unspecified: Secondary | ICD-10-CM

## 2011-09-14 ENCOUNTER — Other Ambulatory Visit: Payer: Self-pay | Admitting: Neurosurgery

## 2011-09-14 DIAGNOSIS — M25559 Pain in unspecified hip: Secondary | ICD-10-CM

## 2011-09-17 ENCOUNTER — Ambulatory Visit
Admission: RE | Admit: 2011-09-17 | Discharge: 2011-09-17 | Disposition: A | Discharge status: Home / Self Care | Payer: Medicare Other | Source: Ambulatory Visit | Attending: Neurosurgery | Admitting: Neurosurgery

## 2011-09-17 DIAGNOSIS — M25559 Pain in unspecified hip: Secondary | ICD-10-CM

## 2011-09-27 ENCOUNTER — Other Ambulatory Visit: Payer: Self-pay | Admitting: Orthopaedic Surgery

## 2011-09-27 DIAGNOSIS — M25552 Pain in left hip: Secondary | ICD-10-CM

## 2011-10-01 ENCOUNTER — Ambulatory Visit
Admission: RE | Admit: 2011-10-01 | Discharge: 2011-10-01 | Disposition: A | Discharge status: Home / Self Care | Payer: Medicare Other | Source: Ambulatory Visit | Attending: Orthopaedic Surgery | Admitting: Orthopaedic Surgery

## 2011-10-01 DIAGNOSIS — M25552 Pain in left hip: Secondary | ICD-10-CM

## 2011-10-01 MED ORDER — IOHEXOL 180 MG/ML  SOLN
2.0000 mL | Freq: Once | INTRAMUSCULAR | Status: AC | PRN
  Administered 2011-10-01: 2 mL via INTRA_ARTICULAR

## 2011-10-04 LAB — BODY FLUID CULTURE
Gram Stain: NONE SEEN
Gram Stain: NONE SEEN
Gram Stain: NONE SEEN

## 2011-10-06 LAB — ANAEROBIC CULTURE: Gram Stain: NONE SEEN

## 2011-10-20 ENCOUNTER — Other Ambulatory Visit: Payer: Self-pay | Admitting: Orthopaedic Surgery

## 2011-10-20 ENCOUNTER — Ambulatory Visit: Payer: Medicare Other

## 2011-10-20 ENCOUNTER — Ambulatory Visit
Admission: RE | Admit: 2011-10-20 | Discharge: 2011-10-20 | Disposition: A | Discharge status: Home / Self Care | Payer: Medicare Other | Source: Ambulatory Visit | Attending: Orthopaedic Surgery | Admitting: Orthopaedic Surgery

## 2011-10-20 DIAGNOSIS — M8700 Idiopathic aseptic necrosis of unspecified bone: Secondary | ICD-10-CM

## 2011-10-28 ENCOUNTER — Encounter (HOSPITAL_COMMUNITY): Payer: Self-pay | Admitting: Pharmacy Technician

## 2011-10-28 DIAGNOSIS — M87059 Idiopathic aseptic necrosis of unspecified femur: Secondary | ICD-10-CM | POA: Diagnosis present

## 2011-10-28 DIAGNOSIS — I482 Chronic atrial fibrillation, unspecified: Secondary | ICD-10-CM | POA: Diagnosis present

## 2011-10-28 NOTE — Pre-Procedure Instructions (Addendum)
[address] [place]  10/28/2011   Your procedure is scheduled on:  Tuesday, October 22  Report to [facility] at 0815 AM.  Call this number if you have problems the morning of surgery: [phone]   Remember:   Do not eat food or drink liquids After Midnight.      Take these medicines the morning of surgery with A SIP OF WATER: *Digoxin,Diltiazem,Isosorbide,Metoprolol,Pantoprazole,Oxybutynin,  hydrocodone  if needed**   Do not wear jewelry, .  Do not wear lotions, powders, or perfumes.   Do not shave 48 hours prior to surgery. Men may shave face and neck.  Do not bring valuables to the hospital.  Contacts, dentures or bridgework may not be worn into surgery.  Leave suitcase in the car. After surgery it may be brought to your room.  For patients admitted to the hospital, checkout time is 11:00 AM the day of discharge.     Special Instructions: Incentive Spirometry - Practice and bring it with you on the day of surgery. Shower using CHG 2 nights before surgery and the night before surgery.  If you shower the day of surgery use CHG.  Use special wash - you have one bottle of CHG for all showers.  You should use approximately 1/3 of the bottle for each shower.   Please read over the following fact sheets that you were given: Pain Booklet, Coughing and Deep Breathing, Blood Transfusion Information, Total Joint Packet, MRSA Information and Surgical Site Infection Prevention

## 2011-10-29 ENCOUNTER — Encounter (HOSPITAL_COMMUNITY): Payer: Self-pay

## 2011-10-29 ENCOUNTER — Encounter (HOSPITAL_COMMUNITY)
Admission: RE | Admit: 2011-10-29 | Discharge: 2011-10-29 | Disposition: A | Discharge status: Home / Self Care | Payer: Medicare Other | Source: Ambulatory Visit | Attending: Orthopaedic Surgery | Admitting: Orthopaedic Surgery

## 2011-10-29 ENCOUNTER — Encounter (HOSPITAL_COMMUNITY)
Admission: RE | Admit: 2011-10-29 | Discharge: 2011-10-29 | Disposition: A | Discharge status: Home / Self Care | Payer: Medicare Other | Source: Ambulatory Visit | Attending: Orthopedic Surgery | Admitting: Orthopedic Surgery

## 2011-10-29 HISTORY — DX: Cardiac murmur, unspecified: R01.1

## 2011-10-29 HISTORY — DX: Unspecified osteoarthritis, unspecified site: M19.90

## 2011-10-29 LAB — URINALYSIS, ROUTINE W REFLEX MICROSCOPIC
Nitrite: NEGATIVE
Protein, ur: NEGATIVE mg/dL
Specific Gravity, Urine: 1.009 (ref 1.005–1.030)
Urobilinogen, UA: 0.2 mg/dL (ref 0.0–1.0)

## 2011-10-29 LAB — COMPREHENSIVE METABOLIC PANEL
ALT: 16 U/L (ref 0–53)
AST: 17 U/L (ref 0–37)
Albumin: 3.8 g/dL (ref 3.5–5.2)
Alkaline Phosphatase: 81 U/L (ref 39–117)
Glucose, Bld: 144 mg/dL — ABNORMAL HIGH (ref 70–99)
Potassium: 3.8 mEq/L (ref 3.5–5.1)
Sodium: 140 mEq/L (ref 135–145)
Total Protein: 7.9 g/dL (ref 6.0–8.3)

## 2011-10-29 LAB — CBC
Hemoglobin: 12.6 g/dL — ABNORMAL LOW (ref 13.0–17.0)
MCHC: 34.6 g/dL (ref 30.0–36.0)
Platelets: 253 10*3/uL (ref 150–400)
RDW: 13.4 % (ref 11.5–15.5)

## 2011-10-29 LAB — TYPE AND SCREEN
ABO/RH(D): A NEG
Antibody Screen: NEGATIVE

## 2011-10-29 LAB — SURGICAL PCR SCREEN: Staphylococcus aureus: NEGATIVE

## 2011-10-29 LAB — APTT: aPTT: 48 seconds — ABNORMAL HIGH (ref 24–37)

## 2011-10-29 MED ORDER — CHLORHEXIDINE GLUCONATE 4 % EX LIQD: 60.0000 mL | Freq: Every day | CUTANEOUS | Status: DC

## 2011-10-29 MED ORDER — CHLORHEXIDINE GLUCONATE 4 % EX LIQD: 60.0000 mL | Freq: Once | CUTANEOUS | Status: DC

## 2011-10-29 NOTE — Progress Notes (Addendum)
req'd notes, tests from sehv recent. Coumadin level coags to be done 10/21 13  req'd results Cath 07 epic Chart left for anesthesia review.

## 2011-10-30 LAB — URINE CULTURE

## 2011-11-01 MED ORDER — DEXTROSE 5 % IV SOLN
3.0000 g | INTRAVENOUS | Status: AC
  Administered 2011-11-02: 2 g via INTRAVENOUS
  Filled 2011-11-01: qty 3000

## 2011-11-01 NOTE — Consult Note (Signed)
Anesthesia chart review: Patient is an 78 year old male scheduled for left total hip replacement by Dr. Cleophas Dunker on 11/02/2011. History includes former smoker, CAD status post RCA stent '97, chronic atrial fibrillation s/p unsuccessful cardioversion 09/2005, DVT, hyperlipidemia, HLD, arthritis, GERD, recurrent prostate cancer status post radiation, hormone therapy and is s/p cryotherapy on 03/01/11.    He was seen by Cardiologist Dr. Zoila Shutter on 10/06/11.  EKG then showed afib @ 68 bpm, cannot rule out anterior infarct (age undetermined), inferolateral ST/T wave abnormality.  By notes, he was felt to be "intermediate risk for surgery."  He did recommend a heparin/Lovenox bridge while off Coumadin since patient had a previous DVT when his INR was subtherapeutic.  Nuclear stress test on 06/03/08 [facility]) showed: Normal myocardial perfusion.  Echo on 07/26/07 [facility]) showed mild concentric LVH, EF greater than 55%, left ventricular systolic function was normal, LAE is mild to moderately dilated, RA is mildly dilated, mild mitral regurgitation, mild tricuspid regurgitation, trace aortic regurgitation.  Cardiac cath on 10/09/04 showed:  VENTRICULOGRAPHY: Ventriculography in the RAO projection revealed an ejection fraction of approximately 50%. There was posterior basilar segment hypokinesis; +2 mitral regurgitation was seen. The left ventricular end-  diastolic pressure was 13.  CORONARY ARTERIOGRAPHY: On fluoroscopy, dense calcification was seen in the proximal portion of the LAD and in the left main.  1. Left main normal; it bifurcated.  2. LAD: The LAD extended down and crossed the apex of the heart. It gave rise to a large left-to-right collateral bed. The proximal LAD had minor irregularities of less than 40%. The ostium of the first diagonal had minor narrowing. The remainder showed no significant disease.  3. Circumflex: The circumflex gave rise to 1 large OM vessel and the OM vessel itself was free of  disease. Proximal to the OM vessel was a focal area of narrowing that was less than 50%.  4. Right coronary artery: On fluoroscopy, a stent was noted in what would be the midportion of the RCA. The vessel, however, was occluded 100% proximally. There were left-to-right collaterals filling the entire posterior descending artery.   CXR on 10/29/2011 showed stable mild chronic bronchitic changes. Stable calcified granulomata.  No acute abnormality.  Labs noted.  BUN/Cr 30/1.64 (33/1.53 on 02/26/11).  Glucose 144.  H/H 12.6/36.4.  PT/PTT elevated.  (He just stopped his Coumadin on 10/28/11.)  Reportedly he is getting these repeated at [facility] on 11/01/11.  If results remain elevated, he will need them repeated on the day of surgery.  Shonna Chock, PA-C

## 2011-11-01 NOTE — Progress Notes (Signed)
Notified pt of time change;to arrive at 0730 and to follow all other previous instructions given

## 2011-11-02 ENCOUNTER — Encounter (HOSPITAL_COMMUNITY)
Admission: RE | Disposition: A | Discharge status: Skilled Nursing Facility | Payer: Self-pay | Source: Ambulatory Visit | Attending: Orthopaedic Surgery

## 2011-11-02 ENCOUNTER — Encounter (HOSPITAL_COMMUNITY): Payer: Self-pay | Admitting: Vascular Surgery

## 2011-11-02 ENCOUNTER — Inpatient Hospital Stay (HOSPITAL_COMMUNITY): Payer: Medicare Other

## 2011-11-02 ENCOUNTER — Encounter (HOSPITAL_COMMUNITY): Payer: Self-pay | Admitting: *Deleted

## 2011-11-02 ENCOUNTER — Inpatient Hospital Stay (HOSPITAL_COMMUNITY)
Admission: RE | Admit: 2011-11-02 | Discharge: 2011-11-04 | DRG: 470 | Disposition: A | Discharge status: Skilled Nursing Facility | Payer: Medicare Other | Source: Ambulatory Visit | Attending: Orthopaedic Surgery | Admitting: Orthopaedic Surgery

## 2011-11-02 ENCOUNTER — Inpatient Hospital Stay (HOSPITAL_COMMUNITY): Payer: Medicare Other | Admitting: Vascular Surgery

## 2011-11-02 DIAGNOSIS — I482 Chronic atrial fibrillation, unspecified: Secondary | ICD-10-CM | POA: Diagnosis present

## 2011-11-02 DIAGNOSIS — Z79899 Other long term (current) drug therapy: Secondary | ICD-10-CM

## 2011-11-02 DIAGNOSIS — Z87891 Personal history of nicotine dependence: Secondary | ICD-10-CM

## 2011-11-02 DIAGNOSIS — I2510 Atherosclerotic heart disease of native coronary artery without angina pectoris: Secondary | ICD-10-CM | POA: Diagnosis present

## 2011-11-02 DIAGNOSIS — Z86718 Personal history of other venous thrombosis and embolism: Secondary | ICD-10-CM

## 2011-11-02 DIAGNOSIS — I129 Hypertensive chronic kidney disease with stage 1 through stage 4 chronic kidney disease, or unspecified chronic kidney disease: Secondary | ICD-10-CM | POA: Diagnosis present

## 2011-11-02 DIAGNOSIS — Z7901 Long term (current) use of anticoagulants: Secondary | ICD-10-CM

## 2011-11-02 DIAGNOSIS — I10 Essential (primary) hypertension: Secondary | ICD-10-CM | POA: Diagnosis present

## 2011-11-02 DIAGNOSIS — D62 Acute posthemorrhagic anemia: Secondary | ICD-10-CM | POA: Diagnosis not present

## 2011-11-02 DIAGNOSIS — Z8546 Personal history of malignant neoplasm of prostate: Secondary | ICD-10-CM

## 2011-11-02 DIAGNOSIS — R7309 Other abnormal glucose: Secondary | ICD-10-CM | POA: Diagnosis present

## 2011-11-02 DIAGNOSIS — I82409 Acute embolism and thrombosis of unspecified deep veins of unspecified lower extremity: Secondary | ICD-10-CM | POA: Diagnosis present

## 2011-11-02 DIAGNOSIS — Z9861 Coronary angioplasty status: Secondary | ICD-10-CM

## 2011-11-02 DIAGNOSIS — N183 Chronic kidney disease, stage 3 unspecified: Secondary | ICD-10-CM | POA: Diagnosis present

## 2011-11-02 DIAGNOSIS — E785 Hyperlipidemia, unspecified: Secondary | ICD-10-CM | POA: Diagnosis present

## 2011-11-02 DIAGNOSIS — M87059 Idiopathic aseptic necrosis of unspecified femur: Principal | ICD-10-CM | POA: Diagnosis present

## 2011-11-02 DIAGNOSIS — E876 Hypokalemia: Secondary | ICD-10-CM | POA: Diagnosis not present

## 2011-11-02 DIAGNOSIS — I4891 Unspecified atrial fibrillation: Secondary | ICD-10-CM | POA: Diagnosis present

## 2011-11-02 DIAGNOSIS — N189 Chronic kidney disease, unspecified: Secondary | ICD-10-CM | POA: Diagnosis present

## 2011-11-02 DIAGNOSIS — C61 Malignant neoplasm of prostate: Secondary | ICD-10-CM | POA: Diagnosis present

## 2011-11-02 DIAGNOSIS — K219 Gastro-esophageal reflux disease without esophagitis: Secondary | ICD-10-CM | POA: Diagnosis present

## 2011-11-02 HISTORY — PX: TOTAL HIP ARTHROPLASTY: SHX124

## 2011-11-02 LAB — PROTIME-INR: Prothrombin Time: 17.6 seconds — ABNORMAL HIGH (ref 11.6–15.2)

## 2011-11-02 LAB — APTT: aPTT: 36 seconds (ref 24–37)

## 2011-11-02 SURGERY — ARTHROPLASTY, HIP, TOTAL,POSTERIOR APPROACH
Anesthesia: General | Site: Hip | Laterality: Left | Wound class: Clean

## 2011-11-02 MED ORDER — FENTANYL CITRATE 0.05 MG/ML IJ SOLN
INTRAMUSCULAR | Status: DC | PRN
  Administered 2011-11-02 (×2): 50 ug via INTRAVENOUS
  Administered 2011-11-02: 150 ug via INTRAVENOUS

## 2011-11-02 MED ORDER — WARFARIN - PHARMACIST DOSING INPATIENT
Freq: Every day | Status: DC
  Administered 2011-11-02: 19:00:00

## 2011-11-02 MED ORDER — ACETAMINOPHEN 10 MG/ML IV SOLN
1000.0000 mg | Freq: Four times a day (QID) | INTRAVENOUS | Status: DC
  Administered 2011-11-02: 1000 mg via INTRAVENOUS

## 2011-11-02 MED ORDER — BISACODYL 10 MG RE SUPP: 10.0000 mg | Freq: Every day | RECTAL | Status: DC | PRN

## 2011-11-02 MED ORDER — BUPIVACAINE-EPINEPHRINE PF 0.25-1:200000 % IJ SOLN
INTRAMUSCULAR | Status: DC | PRN
  Administered 2011-11-02: 30 mL

## 2011-11-02 MED ORDER — HYDROMORPHONE HCL PF 1 MG/ML IJ SOLN
INTRAMUSCULAR | Status: AC
  Filled 2011-11-02: qty 1

## 2011-11-02 MED ORDER — METOCLOPRAMIDE HCL 10 MG PO TABS: 5.0000 mg | ORAL_TABLET | Freq: Three times a day (TID) | ORAL | Status: DC | PRN

## 2011-11-02 MED ORDER — PANTOPRAZOLE SODIUM 40 MG PO TBEC
40.0000 mg | DELAYED_RELEASE_TABLET | Freq: Every day | ORAL | Status: DC
  Administered 2011-11-03 – 2011-11-04 (×2): 40 mg via ORAL
  Filled 2011-11-02 (×2): qty 1

## 2011-11-02 MED ORDER — ALUM & MAG HYDROXIDE-SIMETH 200-200-20 MG/5ML PO SUSP: 30.0000 mL | ORAL | Status: DC | PRN

## 2011-11-02 MED ORDER — EPHEDRINE SULFATE 50 MG/ML IJ SOLN
INTRAMUSCULAR | Status: DC | PRN
  Administered 2011-11-02 (×2): 10 mg via INTRAVENOUS

## 2011-11-02 MED ORDER — LACTATED RINGERS IV SOLN
INTRAVENOUS | Status: DC | PRN
  Administered 2011-11-02 (×3): via INTRAVENOUS

## 2011-11-02 MED ORDER — ENOXAPARIN SODIUM 30 MG/0.3ML ~~LOC~~ SOLN
30.0000 mg | Freq: Two times a day (BID) | SUBCUTANEOUS | Status: DC
  Administered 2011-11-03 – 2011-11-04 (×3): 30 mg via SUBCUTANEOUS
  Filled 2011-11-02 (×5): qty 0.3

## 2011-11-02 MED ORDER — TAMSULOSIN HCL 0.4 MG PO CAPS
0.4000 mg | ORAL_CAPSULE | Freq: Every day | ORAL | Status: DC
  Administered 2011-11-02 – 2011-11-03 (×2): 0.4 mg via ORAL
  Filled 2011-11-02 (×3): qty 1

## 2011-11-02 MED ORDER — LORAZEPAM 0.5 MG PO TABS
0.5000 mg | ORAL_TABLET | Freq: Every day | ORAL | Status: DC
  Administered 2011-11-02 – 2011-11-03 (×2): 0.5 mg via ORAL
  Filled 2011-11-02 (×2): qty 1

## 2011-11-02 MED ORDER — ONDANSETRON HCL 4 MG/2ML IJ SOLN
4.0000 mg | Freq: Once | INTRAMUSCULAR | Status: AC | PRN
  Administered 2011-11-02: 4 mg via INTRAVENOUS

## 2011-11-02 MED ORDER — THROMBIN 20000 UNITS EX KIT
PACK | CUTANEOUS | Status: DC | PRN
  Administered 2011-11-02: 20000 [IU] via TOPICAL

## 2011-11-02 MED ORDER — KETOROLAC TROMETHAMINE 15 MG/ML IJ SOLN
15.0000 mg | Freq: Four times a day (QID) | INTRAMUSCULAR | Status: DC
  Administered 2011-11-02 – 2011-11-03 (×3): 15 mg via INTRAVENOUS
  Filled 2011-11-02 (×4): qty 1

## 2011-11-02 MED ORDER — ONDANSETRON HCL 4 MG/2ML IJ SOLN: 4.0000 mg | Freq: Four times a day (QID) | INTRAMUSCULAR | Status: DC | PRN

## 2011-11-02 MED ORDER — FUROSEMIDE 40 MG PO TABS
40.0000 mg | ORAL_TABLET | Freq: Every day | ORAL | Status: DC
  Administered 2011-11-02 – 2011-11-04 (×3): 40 mg via ORAL
  Filled 2011-11-02 (×3): qty 1

## 2011-11-02 MED ORDER — SODIUM CHLORIDE 0.9 % IR SOLN
Status: DC | PRN
  Administered 2011-11-02: 1000 mL

## 2011-11-02 MED ORDER — METOCLOPRAMIDE HCL 5 MG/ML IJ SOLN: 5.0000 mg | Freq: Three times a day (TID) | INTRAMUSCULAR | Status: DC | PRN

## 2011-11-02 MED ORDER — HYDROMORPHONE HCL PF 1 MG/ML IJ SOLN
0.2500 mg | INTRAMUSCULAR | Status: DC | PRN
  Administered 2011-11-02 (×4): 0.5 mg via INTRAVENOUS

## 2011-11-02 MED ORDER — DOCUSATE SODIUM 100 MG PO CAPS
100.0000 mg | ORAL_CAPSULE | Freq: Two times a day (BID) | ORAL | Status: DC
  Administered 2011-11-02 – 2011-11-04 (×4): 100 mg via ORAL
  Filled 2011-11-02 (×4): qty 1

## 2011-11-02 MED ORDER — THROMBIN 20000 UNITS EX KIT
PACK | CUTANEOUS | Status: AC
  Filled 2011-11-02: qty 1

## 2011-11-02 MED ORDER — CEFAZOLIN SODIUM-DEXTROSE 2-3 GM-% IV SOLR
2.0000 g | Freq: Four times a day (QID) | INTRAVENOUS | Status: AC
  Administered 2011-11-02 (×2): 2 g via INTRAVENOUS
  Filled 2011-11-02 (×2): qty 50

## 2011-11-02 MED ORDER — SODIUM CHLORIDE 0.9 % IV SOLN
75.0000 mL/h | INTRAVENOUS | Status: DC
  Administered 2011-11-02 – 2011-11-03 (×2): 75 mL/h via INTRAVENOUS

## 2011-11-02 MED ORDER — ARTIFICIAL TEARS OP OINT
TOPICAL_OINTMENT | OPHTHALMIC | Status: DC | PRN
  Administered 2011-11-02: 1 via OPHTHALMIC

## 2011-11-02 MED ORDER — BUPIVACAINE-EPINEPHRINE PF 0.25-1:200000 % IJ SOLN
INTRAMUSCULAR | Status: AC
  Filled 2011-11-02: qty 30

## 2011-11-02 MED ORDER — PHENOL 1.4 % MT LIQD: 1.0000 | OROMUCOSAL | Status: DC | PRN

## 2011-11-02 MED ORDER — ONDANSETRON HCL 4 MG PO TABS: 4.0000 mg | ORAL_TABLET | Freq: Four times a day (QID) | ORAL | Status: DC | PRN

## 2011-11-02 MED ORDER — OXYCODONE HCL 5 MG PO TABS
5.0000 mg | ORAL_TABLET | ORAL | Status: DC | PRN
  Administered 2011-11-02 – 2011-11-03 (×3): 5 mg via ORAL
  Filled 2011-11-02 (×3): qty 1

## 2011-11-02 MED ORDER — PROPOFOL 10 MG/ML IV BOLUS
INTRAVENOUS | Status: DC | PRN
  Administered 2011-11-02: 150 mg via INTRAVENOUS

## 2011-11-02 MED ORDER — OXYBUTYNIN CHLORIDE 5 MG PO TABS
5.0000 mg | ORAL_TABLET | Freq: Every day | ORAL | Status: DC
Start: 2011-11-02 — End: 2011-11-04
  Administered 2011-11-02 – 2011-11-04 (×3): 5 mg via ORAL
  Filled 2011-11-02 (×3): qty 1

## 2011-11-02 MED ORDER — ONDANSETRON HCL 4 MG/2ML IJ SOLN
INTRAMUSCULAR | Status: DC | PRN
  Administered 2011-11-02: 4 mg via INTRAVENOUS

## 2011-11-02 MED ORDER — ACETAMINOPHEN 10 MG/ML IV SOLN
INTRAVENOUS | Status: AC
  Filled 2011-11-02: qty 100

## 2011-11-02 MED ORDER — ACETAMINOPHEN 10 MG/ML IV SOLN
1000.0000 mg | Freq: Four times a day (QID) | INTRAVENOUS | Status: AC
  Administered 2011-11-02 – 2011-11-03 (×4): 1000 mg via INTRAVENOUS
  Filled 2011-11-02 (×4): qty 100

## 2011-11-02 MED ORDER — DIGOXIN 125 MCG PO TABS
125.0000 ug | ORAL_TABLET | Freq: Every evening | ORAL | Status: DC
  Administered 2011-11-02: 125 ug via ORAL
  Filled 2011-11-02 (×2): qty 1

## 2011-11-02 MED ORDER — METHOCARBAMOL 100 MG/ML IJ SOLN
500.0000 mg | Freq: Three times a day (TID) | INTRAVENOUS | Status: DC | PRN
  Administered 2011-11-02: 500 mg via INTRAVENOUS
  Filled 2011-11-02: qty 5

## 2011-11-02 MED ORDER — SODIUM CHLORIDE 0.9 % IV SOLN: INTRAVENOUS | Status: DC

## 2011-11-02 MED ORDER — NEOSTIGMINE METHYLSULFATE 1 MG/ML IJ SOLN
INTRAMUSCULAR | Status: DC | PRN
  Administered 2011-11-02: 4 mg via INTRAVENOUS

## 2011-11-02 MED ORDER — MENTHOL 3 MG MT LOZG: 1.0000 | LOZENGE | OROMUCOSAL | Status: DC | PRN

## 2011-11-02 MED ORDER — GLYCOPYRROLATE 0.2 MG/ML IJ SOLN
INTRAMUSCULAR | Status: DC | PRN
  Administered 2011-11-02: .8 mg via INTRAVENOUS

## 2011-11-02 MED ORDER — LISINOPRIL 10 MG PO TABS
10.0000 mg | ORAL_TABLET | Freq: Every day | ORAL | Status: DC
  Administered 2011-11-02: 10 mg via ORAL
  Filled 2011-11-02 (×3): qty 1

## 2011-11-02 MED ORDER — MAGNESIUM HYDROXIDE 400 MG/5ML PO SUSP: 30.0000 mL | Freq: Every day | ORAL | Status: DC | PRN

## 2011-11-02 MED ORDER — DILTIAZEM HCL ER 240 MG PO CP24
240.0000 mg | ORAL_CAPSULE | Freq: Every morning | ORAL | Status: DC
  Administered 2011-11-03: 240 mg via ORAL
  Filled 2011-11-02 (×2): qty 1

## 2011-11-02 MED ORDER — HYDROMORPHONE HCL PF 1 MG/ML IJ SOLN: 0.5000 mg | INTRAMUSCULAR | Status: DC | PRN

## 2011-11-02 MED ORDER — FLEET ENEMA 7-19 GM/118ML RE ENEM: 1.0000 | ENEMA | Freq: Once | RECTAL | Status: AC | PRN

## 2011-11-02 MED ORDER — ONDANSETRON HCL 4 MG/2ML IJ SOLN
INTRAMUSCULAR | Status: AC
  Filled 2011-11-02: qty 2

## 2011-11-02 MED ORDER — WARFARIN SODIUM 5 MG PO TABS
5.0000 mg | ORAL_TABLET | Freq: Once | ORAL | Status: AC
  Administered 2011-11-02: 5 mg via ORAL
  Filled 2011-11-02: qty 1

## 2011-11-02 MED ORDER — VECURONIUM BROMIDE 10 MG IV SOLR
INTRAVENOUS | Status: DC | PRN
  Administered 2011-11-02: 7 mg via INTRAVENOUS

## 2011-11-02 MED ORDER — METHOCARBAMOL 500 MG PO TABS
500.0000 mg | ORAL_TABLET | Freq: Three times a day (TID) | ORAL | Status: DC | PRN
  Administered 2011-11-02 – 2011-11-03 (×2): 500 mg via ORAL
  Filled 2011-11-02 (×3): qty 1

## 2011-11-02 MED ORDER — LACTATED RINGERS IV SOLN
INTRAVENOUS | Status: DC
  Administered 2011-11-02: 09:00:00 via INTRAVENOUS

## 2011-11-02 MED ORDER — ISOSORBIDE MONONITRATE ER 60 MG PO TB24
60.0000 mg | ORAL_TABLET | Freq: Every morning | ORAL | Status: DC
  Administered 2011-11-03 – 2011-11-04 (×2): 60 mg via ORAL
  Filled 2011-11-02 (×2): qty 1

## 2011-11-02 MED ORDER — METOPROLOL SUCCINATE ER 50 MG PO TB24
50.0000 mg | ORAL_TABLET | Freq: Every morning | ORAL | Status: DC
  Administered 2011-11-03: 50 mg via ORAL
  Filled 2011-11-02 (×3): qty 1

## 2011-11-02 SURGICAL SUPPLY — 56 items
BLADE SAW SAG 73X25 THK (BLADE) ×1
BLADE SAW SGTL 73X25 THK (BLADE) ×1 IMPLANT
BRUSH FEMORAL CANAL (MISCELLANEOUS) IMPLANT
CLOTH BEACON ORANGE TIMEOUT ST (SAFETY) ×2 IMPLANT
COVER BACK TABLE 24X17X13 BIG (DRAPES) IMPLANT
COVER SURGICAL LIGHT HANDLE (MISCELLANEOUS) ×2 IMPLANT
DRAPE INCISE IOBAN 66X45 STRL (DRAPES) IMPLANT
DRAPE ORTHO SPLIT 77X108 STRL (DRAPES) ×4
DRAPE SURG ORHT 6 SPLT 77X108 (DRAPES) ×2 IMPLANT
DRSG MEPILEX BORDER 4X12 (GAUZE/BANDAGES/DRESSINGS) ×2 IMPLANT
DURAPREP 26ML APPLICATOR (WOUND CARE) ×2 IMPLANT
ELECT BLADE 6.5 EXT (BLADE) IMPLANT
ELECT REM PT RETURN 9FT ADLT (ELECTROSURGICAL) ×2
ELECTRODE REM PT RTRN 9FT ADLT (ELECTROSURGICAL) ×1 IMPLANT
EVACUATOR 1/8 PVC DRAIN (DRAIN) IMPLANT
FACESHIELD LNG OPTICON STERILE (SAFETY) ×6 IMPLANT
GLOVE BIOGEL PI IND STRL 8 (GLOVE) ×2 IMPLANT
GLOVE BIOGEL PI IND STRL 8.5 (GLOVE) ×1 IMPLANT
GLOVE BIOGEL PI INDICATOR 8 (GLOVE) ×2
GLOVE BIOGEL PI INDICATOR 8.5 (GLOVE) ×1
GLOVE ECLIPSE 8.0 STRL XLNG CF (GLOVE) ×2 IMPLANT
GLOVE SURG ORTHO 8.5 STRL (GLOVE) ×4 IMPLANT
GOWN PREVENTION PLUS XXLARGE (GOWN DISPOSABLE) ×4 IMPLANT
GOWN STRL NON-REIN LRG LVL3 (GOWN DISPOSABLE) ×4 IMPLANT
HANDPIECE INTERPULSE COAX TIP (DISPOSABLE)
IMMOBILIZER KNEE 20 (SOFTGOODS)
IMMOBILIZER KNEE 20 THIGH 36 (SOFTGOODS) IMPLANT
IMMOBILIZER KNEE 22 UNIV (SOFTGOODS) ×1 IMPLANT
IMMOBILIZER KNEE 24 THIGH 36 (MISCELLANEOUS) IMPLANT
IMMOBILIZER KNEE 24 UNIV (MISCELLANEOUS)
KIT BASIN OR (CUSTOM PROCEDURE TRAY) ×2 IMPLANT
KIT ROOM TURNOVER OR (KITS) ×2 IMPLANT
MANIFOLD NEPTUNE II (INSTRUMENTS) ×2 IMPLANT
NEEDLE 22X1 1/2 (OR ONLY) (NEEDLE) ×2 IMPLANT
NS IRRIG 1000ML POUR BTL (IV SOLUTION) ×2 IMPLANT
PACK TOTAL JOINT (CUSTOM PROCEDURE TRAY) ×2 IMPLANT
PAD ARMBOARD 7.5X6 YLW CONV (MISCELLANEOUS) ×4 IMPLANT
PRESSURIZER FEMORAL UNIV (MISCELLANEOUS) IMPLANT
SET HNDPC FAN SPRY TIP SCT (DISPOSABLE) IMPLANT
STAPLER VISISTAT 35W (STAPLE) ×2 IMPLANT
SUCTION FRAZIER TIP 10 FR DISP (SUCTIONS) ×2 IMPLANT
SUT BONE WAX W31G (SUTURE) ×1 IMPLANT
SUT ETHIBOND NAB CT1 #1 30IN (SUTURE) ×6 IMPLANT
SUT MNCRL AB 3-0 PS2 18 (SUTURE) IMPLANT
SUT VIC AB 0 CT1 27 (SUTURE) ×6
SUT VIC AB 0 CT1 27XBRD ANBCTR (SUTURE) ×3 IMPLANT
SUT VIC AB 1 CT1 27 (SUTURE) ×4
SUT VIC AB 1 CT1 27XBRD ANBCTR (SUTURE) ×2 IMPLANT
SUT VIC AB 2-0 CT1 27 (SUTURE) ×2
SUT VIC AB 2-0 CT1 TAPERPNT 27 (SUTURE) ×1 IMPLANT
SYR CONTROL 10ML LL (SYRINGE) ×2 IMPLANT
TOWEL OR 17X24 6PK STRL BLUE (TOWEL DISPOSABLE) ×2 IMPLANT
TOWEL OR 17X26 10 PK STRL BLUE (TOWEL DISPOSABLE) ×2 IMPLANT
TOWER CARTRIDGE SMART MIX (DISPOSABLE) IMPLANT
TRAY FOLEY CATH 14FR (SET/KITS/TRAYS/PACK) ×2 IMPLANT
WATER STERILE IRR 1000ML POUR (IV SOLUTION) ×8 IMPLANT

## 2011-11-02 NOTE — Progress Notes (Signed)
UR COMPLETED  

## 2011-11-02 NOTE — Progress Notes (Addendum)
ANTICOAGULATION CONSULT NOTE - Initial Consult  Pharmacy Consult for Coumadin Indication: VTE prophylaxis / PAF  Allergies  Allergen Reactions  . Lovastatin Other (See Comments)    Leg weakness  . Codeine Rash    Patient Measurements: Weight = 82 kg  Labs:  Basename 11/02/11 0705  HGB --  HCT --  PLT --  APTT 36  LABPROT 17.6*  INR 1.49  HEPARINUNFRC --  CREATININE --  CKTOTAL --  CKMB --  TROPONINI --    CrCl is unknown because there is no height on file for the current visit.   Medical History: Past Medical History  Diagnosis Date  . GERD (gastroesophageal reflux disease)   . Coronary artery disease CARDIOLOGIST - DR LITTLE-- LAST VISIT 2 MON AGO    DENIES S & S  . Hyperlipidemia   . S/P primary angioplasty with coronary stent   . Calcified granuloma of lung RIGHT LUNG BASE - STABLE PER CXR 03-03-2010  . Mitral regurgitation   . Chronic back pain   . Sciatic nerve pain LEFT LEG PAIN--  EPI INJECTIONS  . Low back pain radiating to left leg   . Numbness and tingling of left leg   . Impaired hearing BILATERAL AIDS  . Nocturia   . Prostate cancer RECUR    S/P RADIATIVE SEEDS 2006  . Anticoagulated on Coumadin   . PAF (paroxysmal atrial fibrillation)   . Heart murmur   . Peripheral vascular disease     dvt lft leg 10 yrs ago  . Arthritis    Assessment: On Coumadin PTA for PAF, alternating 2.5 with 5 mg doses every other day  Now s/p left THA  Goal of Therapy:  INR 2-3 Monitor platelets by anticoagulation protocol: Yes   Plan:  1) Coumadin 5 mg po x 1 dose tonight 2) Daily INR  Thank you. Okey Regal, PharmD [phone]  11/02/2011,2:01 PM

## 2011-11-02 NOTE — Preoperative (Signed)
Beta Blockers   Reason not to administer Beta Blockers:Not Applicable 

## 2011-11-02 NOTE — Op Note (Signed)
PATIENT ID:      Greg Booker  MRN:     [id number] DOB/AGE:    1929/12/07 / 78 y.o.       OPERATIVE REPORT    DATE OF PROCEDURE:  11/02/2011       PREOPERATIVE DIAGNOSIS:   AVN Left Hip                                                       There is no height on file to calculate BMI.     POSTOPERATIVE DIAGNOSIS:   AVN Left Hip                                                                     There is no height on file to calculate BMI.   2  PROCEDURE:  Procedure(s): TOTAL HIP ARTHROPLASTY left     SURGEON:  [name], MD    ASSISTANT:   Jacqualine Code, PA-C   (Present and scrubbed throughout the case, critical for assistance with exposure, retraction, instrumentation, and closure.)          ANESTHESIA: none     DRAINS: none :      TOURNIQUET TIME: * No tourniquets in log *    COMPLICATIONS:  None   CONDITION:  stable  PROCEDURE IN DETAIL: 119147   [name] 11/02/2011, 11:51 AM

## 2011-11-02 NOTE — H&P (Signed)
CHIEF COMPLAINT:  Painful left hip.   HISTORY OF PRESENT ILLNESS:  Greg Booker is seen today for evaluation of his left hip.  He has had about a 10 year history of pain in the left hip and groin area and has been treated for many years for this with epidurals etc., thinking that the pain was all from his back.  However, over the past year, his pain had worsened.  He states his pain is now developed more of a severe, excruciating, aching and throbbing pain with occasional sharpness to it.  He has had to use a walker now for many months.  He is to the point now where his pain wakes him up at night time and he is unable to do his activities of daily living at all secondary to his pain and discomfort.  He did have an MRI scan performed and it was noted that he had avascular necrosis of both hips, the left which has certainly been degraded to the point where he has flattened out his head and there is debris in the acetabulum.  The right one, however, has actually begin to revascularize without collapse or deformity.  His pain is now to the point that he is seen today for re-evaluation.   PAST MEDICAL HISTORY:  In general, his health is good.   HOSPITALIZATIONS:  He has had prostate carcinoma and initially had seed placement and then 6 years later the prostate was frozen.  He has also had a stent placed.  He has also had back surgery.   MEDICATIONS:   1.  Lorazepam 0.5 mg daily.  2.  Lisinopril daily.   3.  Furosemide 40 mg daily. 4.  Flomax 0.4 mg nightly. 5.  Digoxin 0.125 mg. 6.  Metoprolol 50 mg. 7.  Isosorbide 60 mg. 8.  Warfarin 5 mg at bedtime. 9.  Acetaminophen 2 daily.  10.  Niacin 500 mg at bedtime. 11.  Super B-complex.   ALLERGIES:  CODEINE which gives him a rash.   REVIEW OF SYSTEMS:  A 14-point review of systems reveals decreased hearing.  He does have heart disease and has had a stent placed.  He also has atrial fibrillation and is presently on Coumadin which he states is about 10 years  in duration.  He also has prostate cancer which has been dormant.   FAMILY HISTORY:  Positive for a mother who died at 34 years of age.  His father died but he is not sure exactly what age or from what disease.  He has 1 brother who is alive at 45, one deceased.  He has 1 sister alive at 55 and 1 deceased.   SOCIAL HISTORY:  Greg Booker is an 78 year old white widowed male.  Retired from [employer].  He has smoked cigarettes, 1 pack a day, for 32 years and he quit in 1975.  He denies the use of alcohol.    PHYSICAL EXAMINATION:  Reveals a pleasant 78 year old white male well developed, well nourished, alert, cooperative, in moderate distress secondary to left hip pain.  He is 5 feet 10 inches, weighs 183 pounds.  His respiration rate is normal.   Head:  Normocephalic.   Eyes:  Reveal pupils equal, round, and reactive to light and accommodation with extraocular movements intact.  Ear, nose, and throat:  Benign.  Neck:  Supple.  No carotid bruits noted. Chest:  Good expansion.  Lungs:  Decreased breath sounds but were clear.  Cardiac:  No regular or irregular heart rate with  a grade 2-3/6 systolic murmur. Pulses:  Trace posterior tib bilaterally. Abdomen:  Obese, soft, nontender.  No masses palpable.  Normal bowel sounds present.  Genital, rectal and breast exam:  Not performed and not indicated for orthopedic evaluation.  CNS:  He is oriented x3 and cranial nerves II-XII grossly intact. Musculoskeletal:  He has minimal motion of the left hip secondary to pain.  He is ambulating today with a walker.  He is neurovascularly intact distally.    RADIOGRAPHS:  Studies reviewed.  Reveals marked degradation of the femoral head with flattening and debris within the acetabulum.  It is quite shortened.    CLINICAL IMPRESSION:   1.  AVN left hip. 2.  AVN right hip. 3.  History of prostate cancer. 4.  History of atrial fibrillation. 5.  Coronary artery disease with stent.   RECOMMENDATIONS:  At this time, I have  reviewed recent clearance forms from his cardiologist as well as his medical doctor.  Because of his Coumadin, it was suggested that we stop his Coumadin 5 days prior and begin him on Lovenox and then wean him back onto his Coumadin post procedure.  They feel that he is an acceptable risk though he does have a risk.  Therefore, I have explained to him the procedure, risks and benefits in detail showing him models and I explained his risks and benefits and he is understanding.  He would like to proceed with this.  We will send him to [facility] for laboratory studies as well as EKG and chest x-ray.  Once these are completed, we will review them and if they are acceptable, then we will proceed with total joint replacement in the near future.

## 2011-11-02 NOTE — Transfer of Care (Signed)
Immediate Anesthesia Transfer of Care Note  Patient: Greg Booker  Procedure(s) Performed: Procedure(s) (LRB) with comments: TOTAL HIP ARTHROPLASTY (Left) - Left Total Hip Replacement  Patient Location: PACU  Anesthesia Type: General  Level of Consciousness: sedated  Airway & Oxygen Therapy: Patient Spontanous Breathing and Patient connected to nasal cannula oxygen  Post-op Assessment: Report given to PACU RN, Post -op Vital signs reviewed and stable and Patient moving all extremities X 4  Post vital signs: Reviewed and stable  Complications: No apparent anesthesia complications

## 2011-11-02 NOTE — Anesthesia Postprocedure Evaluation (Signed)
  Anesthesia Post-op Note  Patient: Greg Booker  Procedure(s) Performed: Procedure(s) (LRB) with comments: TOTAL HIP ARTHROPLASTY (Left) - Left Total Hip Replacement  Patient Location: PACU  Anesthesia Type: General  Level of Consciousness: awake, alert , oriented and patient cooperative  Airway and Oxygen Therapy: Patient Spontanous Breathing and Patient connected to nasal cannula oxygen  Post-op Pain: mild  Post-op Assessment: Post-op Vital signs reviewed, Patient's Cardiovascular Status Stable, Respiratory Function Stable, Patent Airway, No signs of Nausea or vomiting and Pain level controlled  Post-op Vital Signs: stable  Complications: No apparent anesthesia complications

## 2011-11-02 NOTE — Anesthesia Preprocedure Evaluation (Addendum)
Anesthesia Evaluation  Patient identified by MRN, date of birth, ID band Patient awake    Reviewed: Allergy & Precautions, H&P , NPO status , Patient's Chart, lab work & pertinent test results  Airway Mallampati: I TM Distance: >3 FB Neck ROM: full    Dental  (+) Dental Advidsory Given, Partial Lower, Edentulous Upper and Poor Dentition   Pulmonary former smoker,          Cardiovascular + CAD and + Cardiac Stents + dysrhythmias Atrial Fibrillation Rhythm:irregular Rate:Normal     Neuro/Psych  Neuromuscular disease    GI/Hepatic GERD-  ,  Endo/Other    Renal/GU      Musculoskeletal   Abdominal   Peds  Hematology   Anesthesia Other Findings   Reproductive/Obstetrics                         Anesthesia Physical Anesthesia Plan  ASA: III  Anesthesia Plan: General   Post-op Pain Management:    Induction: Intravenous  Airway Management Planned: Oral ETT  Additional Equipment:   Intra-op Plan:   Post-operative Plan: Extubation in OR  Informed Consent: I have reviewed the patients History and Physical, chart, labs and discussed the procedure including the risks, benefits and alternatives for the proposed anesthesia with the patient or authorized representative who has indicated his/her understanding and acceptance.   Dental Advisory Given  Plan Discussed with: CRNA, Anesthesiologist and Surgeon  Anesthesia Plan Comments:        Anesthesia Quick Evaluation

## 2011-11-02 NOTE — Progress Notes (Signed)
Charting done on wrong patient record

## 2011-11-02 NOTE — Progress Notes (Signed)
Patient ID: Greg Booker, male   DOB: February 20, 1929, 78 y.o.   MRN: [id number] The recent History & Physical has been reviewed. I have personally examined the patient today. There is no interval change to the documented History & Physical. The patient would like to proceed with the procedure.  [name] W 11/02/2011,  9:38 AM

## 2011-11-03 ENCOUNTER — Encounter (HOSPITAL_COMMUNITY): Payer: Self-pay | Admitting: Orthopaedic Surgery

## 2011-11-03 DIAGNOSIS — Z7901 Long term (current) use of anticoagulants: Secondary | ICD-10-CM

## 2011-11-03 DIAGNOSIS — I2510 Atherosclerotic heart disease of native coronary artery without angina pectoris: Secondary | ICD-10-CM | POA: Diagnosis present

## 2011-11-03 DIAGNOSIS — I82409 Acute embolism and thrombosis of unspecified deep veins of unspecified lower extremity: Secondary | ICD-10-CM | POA: Diagnosis present

## 2011-11-03 DIAGNOSIS — I10 Essential (primary) hypertension: Secondary | ICD-10-CM | POA: Diagnosis present

## 2011-11-03 DIAGNOSIS — N189 Chronic kidney disease, unspecified: Secondary | ICD-10-CM | POA: Diagnosis present

## 2011-11-03 LAB — CBC
Platelets: 188 10*3/uL (ref 150–400)
RBC: 3.01 MIL/uL — ABNORMAL LOW (ref 4.22–5.81)
RDW: 13.6 % (ref 11.5–15.5)
WBC: 9.3 10*3/uL (ref 4.0–10.5)

## 2011-11-03 LAB — PROTIME-INR
INR: 1.63 — ABNORMAL HIGH (ref 0.00–1.49)
Prothrombin Time: 18.8 seconds — ABNORMAL HIGH (ref 11.6–15.2)

## 2011-11-03 LAB — BASIC METABOLIC PANEL
CO2: 24 mEq/L (ref 19–32)
Chloride: 105 mEq/L (ref 96–112)
GFR calc Af Amer: 49 mL/min — ABNORMAL LOW (ref >90)
Potassium: 4.2 mEq/L (ref 3.5–5.1)
Sodium: 140 mEq/L (ref 135–145)

## 2011-11-03 MED ORDER — WARFARIN SODIUM 5 MG PO TABS
5.0000 mg | ORAL_TABLET | Freq: Once | ORAL | Status: AC
  Administered 2011-11-03: 5 mg via ORAL
  Filled 2011-11-03: qty 1

## 2011-11-03 MED ORDER — ACETAMINOPHEN 10 MG/ML IV SOLN: 1000.0000 mg | Freq: Four times a day (QID) | INTRAVENOUS | Status: DC

## 2011-11-03 MED ORDER — ACETAMINOPHEN 500 MG PO TABS
1000.0000 mg | ORAL_TABLET | Freq: Four times a day (QID) | ORAL | Status: DC
  Administered 2011-11-04 (×2): 1000 mg via ORAL
  Filled 2011-11-03 (×3): qty 2

## 2011-11-03 NOTE — Consult Note (Signed)
Reason for Consult: Notification of surgery  Requesting Physician: Dr Cleophas Dunker  HPI: This is a 78 y.o. male with a past medical history significant for CAD, s/p RCA BMS in 1997, later noted to be occluded in 2006. At that time he had minor LAD disease and 50% CFX narrowing. He has a long history of AF, going back 86yrs. He is now in chronic AF. He saw Dr Rennis Golden in the office 10/06/11 for pre op clearance, see his off ice note for details. The pt has had a recent DVT and is on Coumadin. Myoview was low risk in 2010 and the pt did not want this repeated. He is stable S/P LTHR today. Coumadin has been ordered.  PMHx:  Past Medical History  Diagnosis Date  . GERD (gastroesophageal reflux disease)   . Coronary artery disease CARDIOLOGIST - DR LITTLE-- LAST VISIT 2 MON AGO    DENIES S & S  . Hyperlipidemia   . S/P primary angioplasty with coronary stent   . Calcified granuloma of lung RIGHT LUNG BASE - STABLE PER CXR 03-03-2010  . Mitral regurgitation   . Chronic back pain   . Sciatic nerve pain LEFT LEG PAIN--  EPI INJECTIONS  . Low back pain radiating to left leg   . Numbness and tingling of left leg   . Impaired hearing BILATERAL AIDS  . Nocturia   . Prostate cancer RECUR    S/P RADIATIVE SEEDS 2006  . Anticoagulated on Coumadin   . PAF (paroxysmal atrial fibrillation)   . Heart murmur   . Peripheral vascular disease     dvt lft leg 10 yrs ago  . Arthritis    Past Surgical History  Procedure Date  . Laminectomy 10-28-2008    L3 - 4  . Excision benign breast mass 08-06-2008    LEFT  . Cardioversion 09-15-2005    UNSUCCESSFUL  . Radioactive seed implant 08-03-2004    PROSTATE  . Cardiovascular stress test 06-03-2008    NORMAL STUDY/ LV NORMAL / COMPARED TO 12-26-2000 INFERIOR WALL ISCHEMIA IS NO LONGER PRESENT.  . Transthoracic echocardiogram 07-26-2007    LVSF NORMAL/ EF 55%/ LEFT ATRIUM 4.5CM MILD TO MOD. DILATED/ MILD  MITRAL & TRICUSPID REGURG  . Cryoablation 03/01/2011      Procedure: CRYO ABLATION PROSTATE;  Surgeon: Kathi Ludwig, MD;  Location: [facility];  Service: Urology;  Laterality: N/A;  . Coronary angioplasty with stent placement 1997- DR AL LITTLE    X1 STENT RCA  . Cardiac catheterization 1998    IN-STENT RESTENOSIS-- LARGE COLLATERALS  . Cardiac catheterization 2003  &  2006    2006 ---  MITRAL REGURG/ 100% OCCULSION RCA WITH LARGE COLLATERALS/ MILD DISEASE LAD & CIRCUMFLEX  . Total hip arthroplasty 11/02/2011    Procedure: TOTAL HIP ARTHROPLASTY;  Surgeon: Valeria Batman, MD;  Location: [facility] OR;  Service: Orthopedics;  Laterality: Left;  Left Total Hip Replacement    FAMHx: History reviewed. No pertinent family history.  SOCHx:  reports that he quit smoking about 39 years ago. His smoking use included Cigarettes and Pipe. He has a 25 pack-year smoking history. He has never used smokeless tobacco. He reports that he does not drink alcohol or use illicit drugs.  ALLERGIES: Allergies  Allergen Reactions  . Lovastatin Other (See Comments)    Leg weakness  . Codeine Rash    ROS: Pertinent items are noted in HPI.  HOME MEDICATIONS: Prescriptions prior to admission  Medication Sig Dispense Refill  .  b complex vitamins tablet Take 1 tablet by mouth daily.      . Cholecalciferol (VITAMIN D3) 1000 UNITS CAPS Take 1,000 Units by mouth daily.       . digoxin (LANOXIN) 0.125 MG tablet Take 125 mcg by mouth every evening.      . diltiazem (DILACOR XR) 240 MG 24 hr capsule Take 240 mg by mouth every morning.      . enoxaparin (LOVENOX) 80 MG/0.8ML injection Inject 80 mg into the skin every 12 (twelve) hours.      . furosemide (LASIX) 40 MG tablet Take 40 mg by mouth daily.      Marland Kitchen HYDROcodone-acetaminophen (NORCO) 10-325 MG per tablet Take 1-2 tablets by mouth every 6 (six) hours as needed. For pain      . isosorbide mononitrate (IMDUR) 60 MG 24 hr tablet Take 60 mg by mouth every morning.      Marland Kitchen lisinopril  (PRINIVIL,ZESTRIL) 10 MG tablet Take 10 mg by mouth daily.      Marland Kitchen LORazepam (ATIVAN) 0.5 MG tablet Take 0.5 mg by mouth at bedtime.      . metoprolol succinate (TOPROL-XL) 50 MG 24 hr tablet Take 50 mg by mouth every morning. Take with or immediately following a meal.      . niacin 500 MG tablet Take 500 mg by mouth daily with breakfast.      . oxybutynin (DITROPAN) 5 MG tablet Take 5 mg by mouth daily.      . pantoprazole (PROTONIX) 40 MG tablet Take 40 mg by mouth daily.      . Tamsulosin HCl (FLOMAX) 0.4 MG CAPS Take 0.4 mg by mouth daily after supper.      . warfarin (COUMADIN) 5 MG tablet Take 2.5-5 mg by mouth every evening. Alternates days takes 1/2 tab one day then 1 tab the next        HOSPITAL MEDICATIONS: I have reviewed the patient's current medications.  VITALS: Blood pressure 113/48, pulse 87, temperature 99.3 F (37.4 C), temperature source Oral, resp. rate 16, SpO2 98.00%.  PHYSICAL EXAM: General appearance: alert, cooperative and no distress Neck: no carotid bruit and no JVD Lungs: decreased breath sounds Lt base Heart: irregularly irregular rhythm Abdomen: obese Extremities: no edema Pulses: 2+ and symmetric Skin: cool and dry Neurologic: Grossly normal  LABS: Results for orders placed during the hospital encounter of 11/02/11 (from the past 48 hour(s))  APTT     Status: Normal   Collection Time   11/02/11  7:05 AM      Component Value Range Comment   aPTT 36  24 - 37 seconds   PROTIME-INR     Status: Abnormal   Collection Time   11/02/11  7:05 AM      Component Value Range Comment   Prothrombin Time 17.6 (*) 11.6 - 15.2 seconds    INR 1.49  0.00 - 1.49   CBC     Status: Abnormal   Collection Time   11/03/11  6:05 AM      Component Value Range Comment   WBC 9.3  4.0 - 10.5 K/uL    RBC 3.01 (*) 4.22 - 5.81 MIL/uL    Hemoglobin 9.8 (*) 13.0 - 17.0 g/dL    HCT 16.1 (*) 09.6 - 52.0 %    MCV 95.0  78.0 - 100.0 fL    MCH 32.6  26.0 - 34.0 pg    MCHC 34.3   30.0 - 36.0 g/dL    RDW 13.6  11.5 - 15.5 %    Platelets 188  150 - 400 K/uL   BASIC METABOLIC PANEL     Status: Abnormal   Collection Time   11/03/11  6:05 AM      Component Value Range Comment   Sodium 140  135 - 145 mEq/L    Potassium 4.2  3.5 - 5.1 mEq/L    Chloride 105  96 - 112 mEq/L    CO2 24  19 - 32 mEq/L    Glucose, Bld 137 (*) 70 - 99 mg/dL    BUN 17  6 - 23 mg/dL    Creatinine, Ser 2.13 (*) 0.50 - 1.35 mg/dL    Calcium 8.4  8.4 - 08.6 mg/dL    GFR calc non Af Amer 42 (*) >90 mL/min    GFR calc Af Amer 49 (*) >90 mL/min   PROTIME-INR     Status: Abnormal   Collection Time   11/03/11  6:05 AM      Component Value Range Comment   Prothrombin Time 18.8 (*) 11.6 - 15.2 seconds    INR 1.63 (*) 0.00 - 1.49     IMAGING: Dg Pelvis Portable  11/02/2011  *RADIOLOGY REPORT*  Clinical Data: Postop left hip.  PORTABLE PELVIS,PORTABLE LEFT HIP - 1 VIEW  Comparison: 10/20/2011 CT.  Findings: Post total left hip replacement which appears in satisfactory position without complication noted.  Radiation seed implants prostate region.  No surrounding sclerotic focus.  IMPRESSION: Post total left hip replacement which appears in satisfactory position without complication noted.   Original Report Authenticated By: Fuller Canada, M.D.    Dg Hip Portable 1 View Left  11/02/2011  *RADIOLOGY REPORT*  Clinical Data: Postop left hip.  PORTABLE PELVIS,PORTABLE LEFT HIP - 1 VIEW  Comparison: 10/20/2011 CT.  Findings: Post total left hip replacement which appears in satisfactory position without complication noted.  Radiation seed implants prostate region.  No surrounding sclerotic focus.  IMPRESSION: Post total left hip replacement which appears in satisfactory position without complication noted.   Original Report Authenticated By: Fuller Canada, M.D.     IMPRESSION: Principal Problem:  *Avascular necrosis of femoral head, s/p LTHR Active Problems:  Chronic a-fib  DVT (deep venous  thrombosis) Jan 2013  Chronic anticoagulation  Chronic renal insufficiency, stage III (moderate)  Prostate cancer  CAD, BMS to RCA in '97, occluded RCA and 50% CFX at cath 9/06  HTN (hypertension)   RECOMMENDATION: He was a little bradycardic on exam. Will stop Lanoxin, he has NL LVF and renal insufficiency and he is already on Diltiazem 240 and Toprol 50mg .  Check EKG and BMP in am.  Time Spent Directly with Patient: 45 minutes  KILROY,LUKE K 11/03/2011, 4:47 PM     Patient seen and examined. Agree with assessment and plan. Very pleasant 78 yo WM s/p left hip replacement yesterday.  He has known CAD, s/p BMS in 1997, h/o AF and recent DVT for which he has been on chronic coumadin.  Agree with holding Digoxin, and continue rate control with diltiazem and Toprol.  Coumadin to be restarted today. Rec f/u ECG in am  Lennette Bihari, MD, [facility] 11/03/2011 5:31 PM

## 2011-11-03 NOTE — Progress Notes (Signed)
Physical Therapy Progress Note   11/03/11 1500  PT Visit Information  Last PT Received On 11/03/11  Assistance Needed +1  PT Time Calculation  PT Start Time 1446  PT Stop Time 1505  PT Time Calculation (min) 19 min  Subjective Data  Subjective I am not in any pain  Patient Stated Goal To get better  Precautions  Precautions Posterior Hip  Required Braces or Orthoses Knee Immobilizer - Left  Restrictions  Weight Bearing Restrictions Yes  LLE Weight Bearing WBAT  Cognition  Overall Cognitive Status Appears within functional limits for tasks assessed/performed  Arousal/Alertness Awake/alert  Orientation Level Appears intact for tasks assessed  Behavior During Session [facility] for tasks performed  Bed Mobility  Bed Mobility Not assessed  Transfers  Transfers Sit to Stand;Stand to Sit  Sit to Stand 4: Min guard;With upper extremity assist;From chair/3-in-1  Stand to Sit 4: Min guard;To chair/3-in-1;With upper extremity assist  Details for Transfer Assistance Cues for safety and proper technique.  Ambulation/Gait  Ambulation/Gait Assistance 4: Min assist  Ambulation Distance (Feet) 150 Feet  Assistive device Rolling walker  Ambulation/Gait Assistance Details (A) with balance and RW placement.  Cues for proper technique, posture, and safety.  Stairs No  Engineering geologist No  Balance  Balance Assessed No  Exercises  Exercises Total Joint  Total Joint Exercises  Ankle Circles/Pumps AROM;Both;10 reps  Quad Sets AROM;Left;5 reps  Hip ABduction/ADduction AAROM;Left;5 reps  PT - End of Session  Equipment Utilized During Treatment Gait belt;Left knee immobilizer  Activity Tolerance Patient tolerated treatment well  Patient left in chair;with call bell/phone within reach;with family/visitor present  Nurse Communication Mobility status  PT - Assessment/Plan  Comments on Treatment Session Pt seemed frustrated with therapy.  Pt is wanting to do more but did not  want to over exert self.  Pt perforned several exercises but did not completely understand the workout handout.  Plan for next session is to increase exercises and gait quality.  PT Plan Discharge plan remains appropriate;Frequency remains appropriate  PT Frequency 7X/week  Recommendations for Other Services Other (comment) (None)  Follow Up Recommendations Post acute inpatient  Does the patient have the potential to tolerate intense rehabilitation? No, Recommend SNF  Equipment Recommended None recommended by PT  Acute Rehab PT Goals  PT Goal Formulation With patient  Time For Goal Achievement 11/10/11  Potential to Achieve Goals Fair  Pt will go Sit to Stand with supervision;with upper extremity assist  PT Goal: Sit to Stand - Progress Progressing toward goal  Pt will go Stand to Sit with supervision;with upper extremity assist  PT Goal: Stand to Sit - Progress Progressing toward goal  Pt will Ambulate 51 - 150 feet;with supervision;with least restrictive assistive device  PT Goal: Ambulate - Progress Met  Pt will Perform Home Exercise Program with supervision, verbal cues required/provided  PT Goal: Perform Home Exercise Program - Progress Not met    Reported no pain.  Greg Booker, SPT

## 2011-11-03 NOTE — Progress Notes (Signed)
ANTICOAGULATION CONSULT NOTE - Follow-up Consult  Pharmacy Consult for Coumadin and Lovenox Indication: VTE prophylaxis / PAF  Allergies  Allergen Reactions  . Lovastatin Other (See Comments)    Leg weakness  . Codeine Rash    Patient Measurements: Weight = 82 kg  Labs:  Basename 11/03/11 0605 11/02/11 0705  HGB 9.8* --  HCT 28.6* --  PLT 188 --  APTT -- 36  LABPROT 18.8* 17.6*  INR 1.63* 1.49  HEPARINUNFRC -- --  CREATININE 1.51* --  CKTOTAL -- --  CKMB -- --  TROPONINI -- --    CrCl is unknown because there is no height on file for the current visit.   Medical History: Past Medical History  Diagnosis Date  . GERD (gastroesophageal reflux disease)   . Coronary artery disease CARDIOLOGIST - DR LITTLE-- LAST VISIT 2 MON AGO    DENIES S & S  . Hyperlipidemia   . S/P primary angioplasty with coronary stent   . Calcified granuloma of lung RIGHT LUNG BASE - STABLE PER CXR 03-03-2010  . Mitral regurgitation   . Chronic back pain   . Sciatic nerve pain LEFT LEG PAIN--  EPI INJECTIONS  . Low back pain radiating to left leg   . Numbness and tingling of left leg   . Impaired hearing BILATERAL AIDS  . Nocturia   . Prostate cancer RECUR    S/P RADIATIVE SEEDS 2006  . Anticoagulated on Coumadin   . PAF (paroxysmal atrial fibrillation)   . Heart murmur   . Peripheral vascular disease     dvt lft leg 10 yrs ago  . Arthritis    Assessment: 78 yo male here with avascular necrosis and s/p left THA. He is on  Coumadin PTA for PAF, alternating 2.5 with 5 mg doses every other day (CHADS=2), bridged w/ lovenox pre-op (post-op DVT 02/2011). Patient to continue coumadin post-op for VTE prophylaxis and afib (INR today=1.63)  Goal of Therapy:  INR 2-3 Monitor platelets by anticoagulation protocol: Yes   Plan:  1) Coumadin 5 mg po x 1 dose 10/23; consider 2.5 mg 10/24 2) Daily INR 3) Could consider full bridge (80 mg BID) if renal function improves  Thank you, Bernadene Person PharmD Candidate  11/03/2011,1:27 PM  I have reviewed the above and agree with the plan noted.  Harland German, Pharm D 11/03/2011 1:33 PM

## 2011-11-03 NOTE — Progress Notes (Signed)
Agree with PT treatment note.  Duilio Heritage, PT DPT [phone]  

## 2011-11-03 NOTE — Clinical Social Work Psychosocial (Signed)
     Clinical Social Work Department BRIEF PSYCHOSOCIAL ASSESSMENT 11/03/2011  Patient:  Greg Booker,Greg Booker     Account Number:  [id number]     Admit date:  11/02/2011  Clinical Social Worker:  Tiburcio Pea  Date/Time:  11/03/2011 01:18 PM  Referred by:  Physician  Date Referred:  11/02/2011 Referred for  SNF Placement   Other Referral:   Interview type:  Other - See comment Other interview type:   Met with patient and his friend Venita Sheffield    PSYCHOSOCIAL DATA Living Status:  ALONE Admitted from facility:   Level of care:   Primary support name:  Jeremyah, Jelley.  (Nephew) [phone] Primary support relationship to patient:  FAMILY Degree of support available:   Good support    CURRENT CONCERNS Current Concerns  Post-Acute Placement   Other Concerns:    SOCIAL WORK ASSESSMENT / PLAN Met with patient and his friend this a.m. to discuss d/c planning needs. Patient states that he has prearranged for short term SNF with [facility] and only wants to consider this facility for his rehab. Spoke with Ulice Dash- Admissions Director who confirmed that she has spoken to patient and anticipates accepting him for rehab. Fl2 and referral sent to facility for review. Fl2 place on chart for MD's signature.   Assessment/plan status:  Psychosocial Support/Ongoing Assessment of Needs Other assessment/ plan:   Information/referral to community resources:   SNF list offered but deferred as he has prechosen [facility].    Discussed possible aftercare needs- HH/DME to be arranged by SNF after completion of rehab.    PATIENTS/FAMILYS RESPONSE TO PLAN OF CARE: Patient is alert, oriented and very pleasant.  He has arranged for his aftercare and feels very confident in this plan. He is asking to be transported to facililty by car and feels that this will work. Note left on chart for MD to confirm that this is acceptable vs transport via EMS.

## 2011-11-03 NOTE — Progress Notes (Signed)
Physical Therapy Evaluation Patient Details Name: Greg Booker MRN: [id number] DOB: Apr 19, 1929 Today's Date: 11/03/2011 Time: 5784-6962 PT Time Calculation (min): 23 min  PT Assessment / Plan / Recommendation Clinical Impression  Pt is an 78 y.o. M s/p L THA.  Pt will benefit from acute physical therapy services to increase independence, strength, safety awareness, and gait quality.    PT Assessment  Patient needs continued PT services    Follow Up Recommendations  Post acute inpatient    Does the patient have the potential to tolerate intense rehabilitation   No, Recommend SNF  Barriers to Discharge None      Equipment Recommendations  None recommended by PT    Recommendations for Other Services Other (comment) (None)   Frequency 7X/week    Precautions / Restrictions Precautions Precautions: Posterior Hip Required Braces or Orthoses: Knee Immobilizer - Left Restrictions Weight Bearing Restrictions: Yes LLE Weight Bearing: Weight bearing as tolerated   Pertinent Vitals/Pain No pain reported.      Mobility  Bed Mobility Bed Mobility: Supine to Sit;Sitting - Scoot to Edge of Bed Supine to Sit: 3: Mod assist Sitting - Scoot to Edge of Bed: 4: Min assist Details for Bed Mobility Assistance: (A) for lifting L LE OOB and supporting upper body. Cues for proper technique, hand placement, and safety. Transfers Transfers: Sit to Stand;Stand to [employer] Transfers Sit to Stand: 3: Mod assist;With upper extremity assist;From bed Stand to Sit: 4: Min assist;With upper extremity assist;To chair/3-in-1 Stand Pivot Transfers: 4: Min assist Details for Transfer Assistance: (A) RW placement, proper technique, and proper stepping sequence.  Cues for safety and maintaining hip precautions. Ambulation/Gait Ambulation/Gait Assistance: Not tested (comment) Wheelchair Mobility Wheelchair Mobility: No    Shoulder Instructions     Exercises     PT Diagnosis: Difficulty  walking;Abnormality of gait;Generalized weakness;Acute pain  PT Problem List: Decreased strength;Decreased range of motion;Decreased activity tolerance;Decreased balance;Decreased mobility;Decreased coordination;Decreased knowledge of use of DME;Pain PT Treatment Interventions:     PT Goals Acute Rehab PT Goals PT Goal Formulation: With patient Time For Goal Achievement: 11/10/11 Potential to Achieve Goals: Fair Pt will go Supine/Side to Sit: with supervision;with HOB 0 degrees PT Goal: Supine/Side to Sit - Progress: Goal set today Pt will go Sit to Supine/Side: with supervision;with HOB 0 degrees PT Goal: Sit to Supine/Side - Progress: Goal set today Pt will go Sit to Stand: with supervision;with upper extremity assist PT Goal: Sit to Stand - Progress: Goal set today Pt will go Stand to Sit: with supervision;with upper extremity assist PT Goal: Stand to Sit - Progress: Goal set today Pt will Transfer Bed to Chair/Chair to Bed: with supervision PT Transfer Goal: Bed to Chair/Chair to Bed - Progress: Goal set today Pt will Ambulate: 51 - 150 feet;with supervision;with least restrictive assistive device PT Goal: Ambulate - Progress: Goal set today Pt will Perform Home Exercise Program: with supervision, verbal cues required/provided PT Goal: Perform Home Exercise Program - Progress: Goal set today  Visit Information  Last PT Received On: 11/03/11 Assistance Needed: +1    Subjective Data  Subjective: I am feeling pretty good. Patient Stated Goal: To get better   Prior Functioning  Home Living Type of Home: House Home Layout: One level Home Adaptive Equipment: Walker - rolling Prior Function Level of Independence: Independent with assistive device(s) Able to Take Stairs?: Yes Dominant Hand: Right    Cognition  Overall Cognitive Status: Appears within functional limits for tasks assessed/performed Arousal/Alertness: Awake/alert Orientation Level:  Appears intact for tasks  assessed Behavior During Session: [facility] for tasks performed    Extremity/Trunk Assessment     Balance Balance Balance Assessed: No  End of Session PT - End of Session Equipment Utilized During Treatment: Gait belt;Left knee immobilizer Activity Tolerance: Patient tolerated treatment well Patient left: in chair;with call bell/phone within reach;with family/visitor present Nurse Communication: Mobility status  GP     Arlene Genova 11/03/2011, 12:32 PM

## 2011-11-03 NOTE — Progress Notes (Signed)
Agree with PT evaluation.  Trachelle Low, PT DPT [phone]  

## 2011-11-03 NOTE — Clinical Social Work Placement (Addendum)
    Clinical Social Work Department CLINICAL SOCIAL WORK PLACEMENT NOTE 11/03/2011  Patient:  Greg Booker,Greg Booker  Account Number:  [id number] Admit date:  11/02/2011  Clinical Social Worker:  Lupita Leash Roan [place]uk, LCSWA  Date/time:  11/03/2011 01:27 PM  Clinical Social Work is seeking post-discharge placement for this patient at the following level of care:   SKILLED NURSING   (*CSW will update this form in Epic as items are completed)     Patient/family provided with [facility] Department of Clinical Social Work's list of facilities offering this level of care within the geographic area requested by the patient (or if unable, by the patient's family).    Patient/family informed of their freedom to choose among providers that offer the needed level of care, that participate in Medicare, Medicaid or managed care program needed by the patient, have an available bed and are willing to accept the patient.    Patient/family informed of [facility]' ownership interest in [facility], as well as of the fact that they are under no obligation to receive care at this facility.  PASARR submitted to EDS on 11/03/2011 PASARR number received from EDS on 11/03/2011  FL2 transmitted to all facilities in geographic area requested by pt/family on  11/03/2011 FL2 transmitted to all facilities within larger geographic area on   Patient informed that his/her managed care company has contracts with or will negotiate with  certain facilities, including the following:   [facility]- Medicare Complete  .  Pt deferred SNF list as he has prechosen SNF for rehab     Patient/family informed of bed offers received: 11/03/2011  Patient chooses bed at [facility]  Physician recommends and patient chooses bed at    Patient to be transferred to [facility] on  11/04/11 Patient to be transferred to facility by ambulance [facility])  The following physician request were entered in Epic:   Additional  Comments: OK per pt for d/c today to SNF. Notified facility and pt's nurse- Chari Manning of d/c plan. Lorri Frederick. [place]  [phone]

## 2011-11-03 NOTE — Progress Notes (Signed)
Patient ID: Greg Booker, male   DOB: 09-May-1929, 78 y.o.   MRN: [id number] PATIENT ID: Greg Booker        MRN:  [id number]          DOB/AGE: 12/28/1929 / 78 y.o.    Greg Campbell, MD   Jacqualine Code, PA-C [address] [place], [place]  [address]                             [phone]   PROGRESS NOTE  Subjective:  negative for Chest Pain  negative for Shortness of Breath  negative for Nausea/Vomiting   negative for Calf Pain    Tolerating Diet: yes         Patient reports pain as mild.     Doing well.  States pain is controlled.  Was OOB for 40 minutes yesterday.  Again no chest pain.  Objective: Vital signs in last 24 hours:   Patient Vitals for the past 24 hrs:  BP Temp Temp src Pulse Resp SpO2  11/03/11 0700 108/54 mmHg 98.9 F (37.2 C) Oral 80  18  98 %  11/03/11 0444 - - - - 18  99 %  11/03/11 0256 112/49 mmHg 99.3 F (37.4 C) Oral 64  18  100 %  11/03/11 0023 - - - - 18  99 %  11/02/11 2201 132/62 mmHg 98.6 F (37 C) Oral 67  16  100 %  11/02/11 1340 129/63 mmHg 97.3 F (36.3 C) Oral 82  16  100 %  11/02/11 1318 123/82 mmHg 97.7 F (36.5 C) - 84  16  99 %  11/02/11 1315 142/69 mmHg - - 80  6  99 %  11/02/11 1300 136/51 mmHg - - 68  11  100 %  11/02/11 1245 142/60 mmHg - - 90  10  100 %  11/02/11 1230 123/62 mmHg - - 87  13  100 %  11/02/11 1218 123/64 mmHg 97.7 F (36.5 C) - 89  11  99 %      Intake/Output from previous day:   10/22 0701 - 10/23 0700 In: 2055 [I.V.:2000] Out: 1290 [Urine:890]   Intake/Output this shift:       Intake/Output      10/22 0701 - 10/23 0700 10/23 0701 - 10/24 0700   I.V. 2000    IV Piggyback 55    Total Intake 2055    Urine 890    Blood 400    Total Output 1290    Net +765            LABORATORY DATA:  Basename 11/03/11 0605 10/29/11 0946  WBC 9.3 10.1  HGB 9.8* 12.6*  HCT 28.6* 36.4*  PLT 188 253    Basename 11/03/11 0605 10/29/11 0946  NA 140 140  K 4.2 3.8  CL 105 102  CO2 24 25  BUN 17 30*    CREATININE 1.51* 1.64*  GLUCOSE 137* 144*  CALCIUM 8.4 9.8   Lab Results  Component Value Date   INR 1.63* 11/03/2011   INR 1.49 11/02/2011   INR 2.77* 10/29/2011    Recent Radiographic Studies :   Chest 2 View  10/29/2011  *RADIOLOGY REPORT*  Clinical Data: Left total hip replacement.  Ex-smoker.  CHEST - 2 VIEW  Comparison: 03/03/2010.  Findings: Normal sized heart.  Clear lungs.  Stable mild central peribronchial thickening.  Stable calcified granulomata. Unremarkable bones.  IMPRESSION: Stable  mild chronic bronchitic changes.  No acute abnormality.   Original Report Authenticated By: Darrol Angel, M.D.    Dg Pelvis Portable  11/02/2011  *RADIOLOGY REPORT*  Clinical Data: Postop left hip.  PORTABLE PELVIS,PORTABLE LEFT HIP - 1 VIEW  Comparison: 10/20/2011 CT.  Findings: Post total left hip replacement which appears in satisfactory position without complication noted.  Radiation seed implants prostate region.  No surrounding sclerotic focus.  IMPRESSION: Post total left hip replacement which appears in satisfactory position without complication noted.   Original Report Authenticated By: Fuller Canada, M.D.    Ct Hip Left Wo Contrast  10/20/2011  *RADIOLOGY REPORT*  Clinical Data: Avascular necrosis.  Hip pain.  CT OF THE LEFT HIP WITHOUT CONTRAST  Technique:  Multidetector CT imaging was performed according to the standard protocol. Multiplanar CT image reconstructions were also generated.  Comparison: 09/17/2011  Findings: Extensive collapse of the left femoral head noted with associated cortical irregularities and erosions in both the acetabulum and the remaining femoral head, and several small ossific fragments along these erosive cavities.  Considerable fluid space noted, as on the prior MRI.  Irregular sclerosis along the acetabulum and tracking into the adjacent left superior pubic ramus noted.  Infection not excluded.  However, given the contralateral AVN, much of the appearance  may be secondary to AV and with underlying erosive arthropathy.  There is also muscular in joint capsule calcification favoring heterotopic ossification,  involving the gluteus minimus, biceps femoris, iliopsoas, and joint capsule.  This heterotopic ossification has a ground-glass or osteoid. Tumoral calcinosis in the setting of renal failure might have a similar appearance although heterotopic ossification is favored.  IMPRESSION:  1.  Prominent collapse of the femoral head and erosion of the acetabulum, with small erosive cavities along the bony surfaces and several fragments loose within the joint. 2.  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 3.  Overall the degree of collapse appears similar to prior. 4.  Given the right at any irregular bony surfaces, especially along the acetabulum, infection is not readily excluded, although reportedly cultures have been negative.   Original Report Authenticated By: Dellia Cloud, M.D.    Dg Hip Portable 1 View Left  11/02/2011  *RADIOLOGY REPORT*  Clinical Data: Postop left hip.  PORTABLE PELVIS,PORTABLE LEFT HIP - 1 VIEW  Comparison: 10/20/2011 CT.  Findings: Post total left hip replacement which appears in satisfactory position without complication noted.  Radiation seed implants prostate region.  No surrounding sclerotic focus.  IMPRESSION: Post total left hip replacement which appears in satisfactory position without complication noted.   Original Report Authenticated By: Fuller Canada, M.D.      Examination:  General appearance: alert, cooperative and mild distress Resp: diminished breath sounds bilaterally Cardio: irregularly irregular rhythm and systolic murmur: systolic ejection 3/6, blowing throughout the precordium GI: normal findings: bowel sounds normal  Wound Exam: clean, dry, intact   Drainage:  None: wound tissue dry  Motor Exam: EHL, FHL, Anterior Tibial and Posterior Tibial  Intact  Sensory Exam: Superficial Peroneal, Deep Peroneal and Tibial normal  Vascular Exam: Left posterior tibial artery has 1+ (weak) pulse  Left hip films reveals excellent position without occult fracture.  Assessment:    1 Day Post-Op  Procedure(s) (LRB): TOTAL HIP ARTHROPLASTY (Left)  ADDITIONAL DIAGNOSIS:  Principal Problem:  *Avascular necrosis of femoral head Active Problems:  A-fib  Acute Blood Loss Anemia, Hyperglycemia, Cardiac Arrythmia Atrial fibrillation and Renal Insufficiency Chronic  Plan: Physical Therapy as ordered Weight Bearing as Tolerated (WBAT)  DVT Prophylaxis:  Lovenox, Coumadin, Foot Pumps and TED hose  DISCHARGE PLAN: Skilled Nursing Facility/Rehab Family prefers [facility]  DISCHARGE NEEDS: HHPT, Walker and 3-in-1 comode seat         PETRARCA,BRIAN 11/03/2011 7:58 AM

## 2011-11-03 NOTE — Op Note (Signed)
NAMEMARCELLO, Greg Booker NO.:  [id number]  MEDICAL RECORD NO.:  [id number]  LOCATION:  [id number]                        FACILITY:  [facility]  PHYSICIAN:  Claude Manges. Camara Rosander, M.D.DATE OF BIRTH:  26-Apr-1929  DATE OF PROCEDURE:  11/02/2011 DATE OF DISCHARGE:                              OPERATIVE REPORT   PREOPERATIVE DIAGNOSIS:  Avascular necrosis, left femoral head with complete collapse.  POSTOPERATIVE DIAGNOSIS:  Avascular necrosis, left femoral head with complete collapse.  PROCEDURE:  Left total hip replacement.  SURGEON:  Claude Manges. Cleophas Dunker, M.D.  ASSISTANT:  Arlys John D. Petrarca, PA-C, was present throughout the operative procedure to ensure its timely completion.  ANESTHESIA:  General.  COMPLICATIONS:  None.  COMPONENTS:  DePuy AML 56 mm outer diameter Gription acetabular shell with a single 3 cm x 6.5 mm cancellous bone screw, Pinnacle marathon polyethylene acetabular liner +4 with a 10-degree posterior lip.  AML large stature 16.5 mm femoral component, a 36 mm outer diameter femoral head with a +1.5 mm neck.  All components were press-fit.  PROCEDURE:  Mr. Eaddy was met in the holding area, identified his left hip as the appropriate operative site and marked it at the bedside.  The patient was then transported to room #4 and placed under general anesthesia without difficulty.  The nursing staff inserted a Foley catheter without difficulty.  Urine was clear.  The patient was then placed in the lateral decubitus position with the left side up and secured to the operating room table with the Innomed hip system.  The left hip was then prepped from the iliac crest to the midcalf with Betadine scrub and then DuraPrep.  Sterile draping was performed.  Routine Southern incision was utilized via sharp dissection, carried down to subcutaneous tissue.  Gross bleeders were Bovie coagulated. Adipose tissue was incised to the level of the iliotibial bands,  small bleeders were Bovie coagulated.  Self-retaining retractor was then inserted.  The iliotibial band was identified and incised along length of the skin incision.  Retractors were placed more deeply.  The short external rotators were visualized with some difficulty as the patient had significant contractures about the hip with minimal internal rotation.  Short external rotators were then incised.  The capsule was identified and incised along the femoral neck and head.  The joint was then entered through the capsule along the femoral neck and head.  There was minimal effusion.  We were then able to dislocate the remaining femoral head posteriorly.  It was significantly deformed with considerable avascular necrosis.  Using the calcar guide, the femoral neck was osteotomized about a fingerbreadth proximal to the lesser trochanter.  Reaming was performed sequentially to 14.5 mm to accept a 15 mm component.  I then impacted sequentially up to a 15 mm large stature femoral component.  Retractors were then placed about the acetabulum, a very degenerative labrum was excised.  There was considerable debris within the acetabulum consistent with soft tissue, extraneous labrum, and dissolved femoral head.  This was just removed, retractors in place, about the stem so that I could visualize the joint.  The acetabulum was deep, it was irregular, but otherwise intact from a bony  standpoint.  Reaming was then performed sequentially to 55 mm to accept a 56 mm component.  I was careful not to deepen the acetabulum, just enlarge it.  I then trialed a 54 and 56 component, the 56 fit very nicely.  The acetabulum was irrigated, it looked like he had nice bleeding bone, and I had bone circumferentially.  I then elected to use the Gription 56 mm outer diameter acetabular shell with 3 central holes with insertion of acetabular screw.  This was gently impacted using the acetabulum guide.  The trialed  polyethylene bearing was then inserted, and then we inserted the 15 mm large stature femoral component, and it looked like it was too loose, so then I re-reamed the canal to 16 and sequentially inserted a 16.5 mm large stature femoral component that was considerably tighter.  We then trialed several neck lengths.  We actually could not reduce the 5 mm neck length, so we impacted the 1.5 mm neck length, 36 mm outer diameter hip ball.  His entire construct was then reduced through a full range of motion until we had perfect stability and no toggling and I felt leg lengths had been reestablished, he was about half an inch short preoperatively.  Trial components were removed.  The joint was copiously irrigated with saline solution.  I did ream 1 of the central 3 holes in the acetabulum and felt that I was complete within bone and then inserted a 3 cm x 6.5 mm diameter acetabular titanium screw.  The apex hole eliminator was then inserted.  I re-irrigated the acetabulum and then inserted the Marathon polyethylene liner, 10-degree posterior lip +4.  The wound was then irrigated.  Then we impacted the final femoral stem 16.5 mm large stature, fit nicely on the calcar and was nice and tight. We cleaned the [facility] taper neck and applied the final +1.5 mm neck length, 36 mm diameter hip ball.  Checked the acetabulum was clear and then we reduced the hip and again through a full range of motion, we appeared perfect stability, leg lengths appeared to be equal, and there was no toggling.  Wound was again irrigated saline solution.  We then injected the deep capsule with 0.25% Marcaine with epinephrine.  Capsule was closed anatomically with #1 Ethibond suture.  The short external rotators closed with similar material.  We then injected other soft tissue with 0.25% Marcaine with epinephrine and sprayed thrombin in the deep capsule in the short external rotators and the wound.  The iliotibial band was  then closed with a running #1 Vicryl.  Subcu closed with 3-0 Monocryl.  Skin with skin clips.  Sterile bulky dressing was applied.  The patient was then awoke and placed in the supine position, transferred to the operating room stretcher, and returned to the postanesthesia recovery room in a satisfactory condition, tolerated the procedure well.     Claude Manges. Cleophas Dunker, M.D.     PWW/MEDQ  D:  11/02/2011  T:  11/03/2011  Job:  [id number]

## 2011-11-03 NOTE — Progress Notes (Signed)
CARE MANAGEMENT NOTE 11/03/2011  Patient:  Greg Booker,Greg Booker   Account Number:  [id number]  Date Initiated:  11/02/2011  Documentation initiated by:  Anette Guarneri  Subjective/Objective Assessment:   THA  family request SNF/[facility]     Action/Plan:   SNF   Anticipated DC Date:  11/05/2011   Anticipated DC Plan:  SKILLED NURSING FACILITY  In-house referral  Clinical Social Worker         Choice offered to / List presented to:             Status of service:  Completed, signed off Medicare Important Message given?   (If response is "NO", the following Medicare IM given date fields will be blank) Date Medicare IM given:   Date Additional Medicare IM given:    Discharge Disposition:  SKILLED NURSING FACILITY  Per UR Regulation:  Reviewed for med. necessity/level of care/duration of stay  If discussed at Long Length of Stay Meetings, dates discussed:    Comments:

## 2011-11-04 ENCOUNTER — Encounter (HOSPITAL_COMMUNITY): Payer: Self-pay | Admitting: General Practice

## 2011-11-04 DIAGNOSIS — E876 Hypokalemia: Secondary | ICD-10-CM | POA: Diagnosis not present

## 2011-11-04 LAB — BASIC METABOLIC PANEL
BUN: 15 mg/dL (ref 6–23)
Calcium: 8.3 mg/dL — ABNORMAL LOW (ref 8.4–10.5)
Creatinine, Ser: 1.46 mg/dL — ABNORMAL HIGH (ref 0.50–1.35)
GFR calc non Af Amer: 44 mL/min — ABNORMAL LOW (ref >90)
Glucose, Bld: 131 mg/dL — ABNORMAL HIGH (ref 70–99)
Sodium: 137 mEq/L (ref 135–145)

## 2011-11-04 LAB — PROTIME-INR: Prothrombin Time: 20.7 seconds — ABNORMAL HIGH (ref 11.6–15.2)

## 2011-11-04 LAB — CBC
Hemoglobin: 9.5 g/dL — ABNORMAL LOW (ref 13.0–17.0)
MCH: 32.5 pg (ref 26.0–34.0)
MCHC: 34.5 g/dL (ref 30.0–36.0)

## 2011-11-04 MED ORDER — WARFARIN SODIUM 2.5 MG PO TABS: 2.5000 mg | ORAL_TABLET | Freq: Once | ORAL | Status: DC

## 2011-11-04 MED ORDER — DSS 100 MG PO CAPS: 100.0000 mg | ORAL_CAPSULE | Freq: Two times a day (BID) | ORAL | Status: DC

## 2011-11-04 MED ORDER — ENOXAPARIN SODIUM 30 MG/0.3ML ~~LOC~~ SOLN: 30.0000 mg | Freq: Two times a day (BID) | SUBCUTANEOUS | Status: DC

## 2011-11-04 MED ORDER — HYDROCODONE-ACETAMINOPHEN 5-325 MG PO TABS: 1.0000 | ORAL_TABLET | Freq: Four times a day (QID) | ORAL | Status: DC | PRN

## 2011-11-04 MED ORDER — POTASSIUM CHLORIDE CRYS ER 20 MEQ PO TBCR
40.0000 meq | EXTENDED_RELEASE_TABLET | Freq: Once | ORAL | Status: AC
  Administered 2011-11-04: 40 meq via ORAL
  Filled 2011-11-04: qty 2

## 2011-11-04 MED ORDER — WARFARIN SODIUM 2.5 MG PO TABS
2.5000 mg | ORAL_TABLET | Freq: Once | ORAL | Status: DC
  Filled 2011-11-04: qty 1

## 2011-11-04 NOTE — Evaluation (Signed)
I agree with the following treatment note after reviewing documentation.   Johnston, Vincente Asbridge Brynn   OTR/L Pager: [phone] Office: [phone] .   

## 2011-11-04 NOTE — Progress Notes (Signed)
Pt. Seen and examined. Agree with the NP/PA-C note as written.  Call if needed. Will sign-off.  Chrystie Nose, MD, [facility] Attending Cardiologist [facility]

## 2011-11-04 NOTE — Progress Notes (Signed)
Pt discharged to [facility] in [place]. Pt left unit in stable condition with EMS. Report called to nurse at [facility] in [place].

## 2011-11-04 NOTE — Discharge Summary (Signed)
Skilled nursing facility [name], MD   Jacqualine Code, PA-C [address], [place], [place]  [address]                             [phone]  PATIENT ID: Greg Booker        MRN:  [id number]          DOB/AGE: 1929/10/01 / 78 y.o.    DISCHARGE SUMMARY  ADMISSION DATE:    11/02/2011 DISCHARGE DATE:   11/04/2011   ADMISSION DIAGNOSIS: AVN Left Hip    DISCHARGE DIAGNOSIS:  AVN Left Hip    ADDITIONAL DIAGNOSIS: Principal Problem:  *Avascular necrosis of femoral head, s/p LTHR Active Problems:  Prostate cancer  Chronic a-fib  DVT (deep venous thrombosis) Jan 2013  Chronic anticoagulation  CAD, BMS to RCA in '97, occluded RCA and 50% CFX at cath 9/06  HTN (hypertension)  Chronic renal insufficiency, stage III (moderate)  Hypokalemia  Past Medical History  Diagnosis Date  . GERD (gastroesophageal reflux disease)   . Coronary artery disease CARDIOLOGIST - DR LITTLE-- LAST VISIT 2 MON AGO    DENIES S & S  . Hyperlipidemia   . S/P primary angioplasty with coronary stent   . Calcified granuloma of lung RIGHT LUNG BASE - STABLE PER CXR 03-03-2010  . Mitral regurgitation   . Chronic back pain   . Sciatic nerve pain LEFT LEG PAIN--  EPI INJECTIONS  . Low back pain radiating to left leg   . Numbness and tingling of left leg   . Impaired hearing BILATERAL AIDS  . Nocturia   . Prostate cancer RECUR    S/P RADIATIVE SEEDS 2006  . Anticoagulated on Coumadin   . PAF (paroxysmal atrial fibrillation)   . Heart murmur   . Peripheral vascular disease     dvt lft leg 10 yrs ago  . Arthritis     PROCEDURE: Procedure(s): TOTAL HIP ARTHROPLASTY Lefton 11/02/2011  CONSULTS: Cardiology     HISTORY: Greg Booker is seen today for evaluation of his left hip. He has had about a 10 year history of pain in the left hip and groin area and has been treated for many years for this with epidurals etc., thinking that the pain was all from his back. However, over the past year, his  pain had worsened. He states his pain is now developed more of a severe, excruciating, aching and throbbing pain with occasional sharpness to it. He has had to use a walker now for many months. He is to the point now where his pain wakes him up at night time and he is unable to do his activities of daily living at all secondary to his pain and discomfort. He did have an MRI scan performed and it was noted that he had avascular necrosis of both hips, the left which has certainly been degraded to the point where he has flattened out his head and there is debris in the acetabulum. The right one, however, has actually begin to revascularize without collapse or deformity.    HOSPITAL COURSE:  Greg Booker is a 78 y.o. admitted on 11/02/2011 and found to have a diagnosis of AVN Left Hip.  After appropriate laboratory studies were obtained  they were taken to the operating room on 11/02/2011 and underwent  Procedure(s): TOTAL HIP ARTHROPLASTY Left.   They were given perioperative antibiotics:  Anti-infectives     Start  Dose/Rate Route Frequency Ordered Stop   11/02/11 1600   ceFAZolin (ANCEF) IVPB 2 g/50 mL premix        2 g 100 mL/hr over 30 Minutes Intravenous Every 6 hours 11/02/11 1353 11/02/11 2217   11/01/11 1605   ceFAZolin (ANCEF) 3 g in dextrose 5 % 50 mL IVPB        3 g 160 mL/hr over 30 Minutes Intravenous 60 min pre-op 11/01/11 1605 11/02/11 0950        .  Tolerated the procedure well.  Placed with a foley intraoperatively.  Given Ofirmev at induction and for 48 hours.    POD #1, allowed out of bed to a chair.  PT for ambulation and exercise program.  Foley D/C'd in morning.  IV saline locked.  O2 discontionued.  Cardiology consult ordered.  POD #2, continued PT and ambulation.  Cardiology gave KCL and wrote may be d/c'd today to SNF off digoxin  The remainder of the hospital course was dedicated to ambulation and strengthening.   The patient was discharged on 2 Days Post-Op in   Stable condition.  Blood products given:none  DIAGNOSTIC STUDIES: Recent vital signs: Patient Vitals for the past 24 hrs:  BP Temp Temp src Pulse Resp SpO2  11/04/11 1200 - - - - 18  98 %  11/04/11 0800 - - - - 18  98 %  11/04/11 0525 112/47 mmHg 98.7 F (37.1 C) - 66  18  98 %  11/09/11 2131 113/53 mmHg 101.9 F (38.8 C) Oral 92  20  96 %  Nov 09, 2011 1345 113/48 mmHg 99.3 F (37.4 C) Oral 87  16  98 %       Recent laboratory studies:  Basename 11/04/11 0635 11-09-11 0605 10/29/11 0946  WBC 11.0* 9.3 10.1  HGB 9.5* 9.8* 12.6*  HCT 27.5* 28.6* 36.4*  PLT 199 188 253    Basename 11/04/11 0635 2011-11-09 0605 10/29/11 0946  NA 137 140 140  K 3.3* 4.2 3.8  CL 103 105 102  CO2 25 24 25   BUN 15 17 30*  CREATININE 1.46* 1.51* 1.64*  GLUCOSE 131* 137* 144*  CALCIUM 8.3* 8.4 9.8   Lab Results  Component Value Date   INR 1.85* 11/04/2011   INR 1.63* 11/09/2011   INR 1.49 11/02/2011     Recent Radiographic Studies :   Chest 2 View  10/29/2011  *RADIOLOGY REPORT*  Clinical Data: Left total hip replacement.  Ex-smoker.  CHEST - 2 VIEW  Comparison: 03/03/2010.  Findings: Normal sized heart.  Clear lungs.  Stable mild central peribronchial thickening.  Stable calcified granulomata. Unremarkable bones.  IMPRESSION: Stable mild chronic bronchitic changes.  No acute abnormality.   Original Report Authenticated By: Darrol Angel, M.D.    Dg Pelvis Portable  11/02/2011  *RADIOLOGY REPORT*  Clinical Data: Postop left hip.  PORTABLE PELVIS,PORTABLE LEFT HIP - 1 VIEW  Comparison: 10/20/2011 CT.  Findings: Post total left hip replacement which appears in satisfactory position without complication noted.  Radiation seed implants prostate region.  No surrounding sclerotic focus.  IMPRESSION: Post total left hip replacement which appears in satisfactory position without complication noted.   Original Report Authenticated By: Fuller Canada, M.D.    Ct Hip Left Wo Contrast  10/20/2011   *RADIOLOGY REPORT*  Clinical Data: Avascular necrosis.  Hip pain.  CT OF THE LEFT HIP WITHOUT CONTRAST  Technique:  Multidetector CT imaging was performed according to the standard protocol. Multiplanar CT image reconstructions were  also generated.  Comparison: 09/17/2011  Findings: Extensive collapse of the left femoral head noted with associated cortical irregularities and erosions in both the acetabulum and the remaining femoral head, and several small ossific fragments along these erosive cavities.  Considerable fluid space noted, as on the prior MRI.  Irregular sclerosis along the acetabulum and tracking into the adjacent left superior pubic ramus noted.  Infection not excluded.  However, given the contralateral AVN, much of the appearance may be secondary to AV and with underlying erosive arthropathy.  There is also muscular in joint capsule calcification favoring heterotopic ossification,  involving the gluteus minimus, biceps femoris, iliopsoas, and joint capsule.  This heterotopic ossification has a ground-glass or osteoid. Tumoral calcinosis in the setting of renal failure might have a similar appearance although heterotopic ossification is favored.  IMPRESSION:  1.  Prominent collapse of the femoral head and erosion of the acetabulum, with small erosive cavities along the bony surfaces and several fragments loose within the joint. 2.  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 3.  Overall the degree of collapse appears similar to prior. 4.  Given the right at any irregular bony surfaces, especially along the acetabulum, infection is not readily excluded, although reportedly cultures have been negative.   Original Report Authenticated By: Dellia Cloud, M.D.    Dg Hip Portable 1 View Left  11/02/2011  *RADIOLOGY REPORT*  Clinical Data: Postop left hip.  PORTABLE PELVIS,PORTABLE LEFT HIP - 1 VIEW  Comparison: 10/20/2011 CT.  Findings: Post total  left hip replacement which appears in satisfactory position without complication noted.  Radiation seed implants prostate region.  No surrounding sclerotic focus.  IMPRESSION: Post total left hip replacement which appears in satisfactory position without complication noted.   Original Report Authenticated By: Fuller Canada, M.D.     DISCHARGE INSTRUCTIONS: Discharge Orders    Future Orders Please Complete By Expires   Diet - low sodium heart healthy      Call MD / Call 911      Comments:   If you experience chest pain or shortness of breath, CALL 911 and be transported to the hospital emergency room.  If you develope a fever above 101 F, pus (white drainage) or increased drainage or redness at the wound, or calf pain, call your surgeon's office.   Constipation Prevention      Comments:   Drink plenty of fluids.  Prune juice may be helpful.  You may use a stool softener, such as Colace (over the counter) 100 mg twice a day.  Use MiraLax (over the counter) for constipation as needed.   Increase activity slowly as tolerated      Patient may shower      Comments:   You may shower without a dressing once there is no drainage.  Do not wash over the wound.  If drainage remains, cover wound with plastic wrap and then shower.   Weight bearing as tolerated      Driving restrictions      Comments:   No driving for 6 weeks   Lifting restrictions      Comments:   No lifting for 6 weeks   Discharge instructions      Comments:   Pharmacist for dosing of Coumadin.  Will need to continue lovenox till INR is therapeutic.   Follow the hip precautions as taught in Physical Therapy      Change dressing      Comments:  You may change your dressing on Sunday, then change the dressing daily with sterile 4 x 4 inch gauze dressing and paper tape.  You may clean the incision with alcohol prior to redressing   TED hose      Comments:   Use stockings (TED hose) for 3 weeks on operative leg(s).  You may remove  them at night for sleeping.      DISCHARGE MEDICATIONS:     Medication List     As of 11/04/2011  1:20 PM    STOP taking these medications         digoxin 0.125 MG tablet   Commonly known as: LANOXIN      HYDROcodone-acetaminophen 10-325 MG per tablet   Commonly known as: NORCO      TAKE these medications         b complex vitamins tablet   Take 1 tablet by mouth daily.      diltiazem 240 MG 24 hr capsule   Commonly known as: DILACOR XR   Take 240 mg by mouth every morning.      DSS 100 MG Caps   Take 100 mg by mouth 2 (two) times daily.      enoxaparin 30 MG/0.3ML injection   Commonly known as: LOVENOX   Inject 0.3 mLs (30 mg total) into the skin every 12 (twelve) hours. Until INR is Greater that 2.0 then D/C lovenox      furosemide 40 MG tablet   Commonly known as: LASIX   Take 40 mg by mouth daily.      HYDROcodone-acetaminophen 5-325 MG per tablet   Commonly known as: NORCO/VICODIN   Take 1-2 tablets by mouth every 6 (six) hours as needed for pain.      isosorbide mononitrate 60 MG 24 hr tablet   Commonly known as: IMDUR   Take 60 mg by mouth every morning.      lisinopril 10 MG tablet   Commonly known as: PRINIVIL,ZESTRIL   Take 10 mg by mouth daily.      LORazepam 0.5 MG tablet   Commonly known as: ATIVAN   Take 0.5 mg by mouth at bedtime.      metoprolol succinate 50 MG 24 hr tablet   Commonly known as: TOPROL-XL   Take 50 mg by mouth every morning. Take with or immediately following a meal.      niacin 500 MG tablet   Take 500 mg by mouth daily with breakfast.      oxybutynin 5 MG tablet   Commonly known as: DITROPAN   Take 5 mg by mouth daily.      pantoprazole 40 MG tablet   Commonly known as: PROTONIX   Take 40 mg by mouth daily.      Tamsulosin HCl 0.4 MG Caps   Commonly known as: FLOMAX   Take 0.4 mg by mouth daily after supper.      Vitamin D3 1000 UNITS Caps   Take 1,000 Units by mouth daily.      warfarin 2.5 MG tablet    Commonly known as: COUMADIN   Take 1 tablet (2.5 mg total) by mouth one time only at 6 PM. Pharmacist to monitor INR and prescribe Coumadin as appropriate.        FOLLOW UP VISIT:       Follow-up Information    Follow up with [facility] On 11/17/2011.   Contact information:   [address] [place] [address] [phone]  DISPOSITION:  SNF  CONDITION:  {Stable  Hallie Ertl 11/04/2011, 1:20 PM

## 2011-11-04 NOTE — Progress Notes (Signed)
Agree with PT cancellation.  [name], PT DPT [phone]  

## 2011-11-04 NOTE — Progress Notes (Signed)
Patient ID: Greg Booker, male   DOB: 02-May-1929, 78 y.o.   MRN: [id number] PATIENT ID: Greg Booker        MRN:  [id number]          DOB/AGE: 1929-10-19 / 78 y.o.    Greg Campbell, MD   Greg Code, PA-C [address] [place], [place]  [address]                             [phone]   PROGRESS NOTE  Subjective:  negative for Chest Pain  negative for Shortness of Breath  negative for Nausea/Vomiting   negative for Calf Pain    Tolerating Diet: yes         Patient reports pain as 3 on 0-10 scale.     Able to get from bed to chair comfortably  Objective: Vital signs in last 24 hours:   Patient Vitals for the past 24 hrs:  BP Temp Temp src Pulse Resp SpO2  11/04/11 0800 - - - - 18  98 %  11/04/11 0525 112/47 mmHg 98.7 F (37.1 C) - 66  18  98 %  11/03/11 2131 113/53 mmHg 101.9 F (38.8 C) Oral 92  20  96 %  11/03/11 1345 113/48 mmHg 99.3 F (37.4 C) Oral 87  16  98 %      Intake/Output from previous day:   10/23 0701 - 10/24 0700 In: 390 [P.O.:390] Out: 1400 [Urine:1400]   Intake/Output this shift:       Intake/Output      10/23 0701 - 10/24 0700 10/24 0701 - 10/25 0700   P.O. 390    I.V.     IV Piggyback     Total Intake 390    Urine 1400    Blood     Total Output 1400    Net -1010            LABORATORY DATA:  Basename 11/04/11 0635 11/03/11 0605 10/29/11 0946  WBC 11.0* 9.3 10.1  HGB 9.5* 9.8* 12.6*  HCT 27.5* 28.6* 36.4*  PLT 199 188 253    Basename 11/04/11 0635 11/03/11 0605 10/29/11 0946  NA 137 140 140  K 3.3* 4.2 3.8  CL 103 105 102  CO2 25 24 25   BUN 15 17 30*  CREATININE 1.46* 1.51* 1.64*  GLUCOSE 131* 137* 144*  CALCIUM 8.3* 8.4 9.8   Lab Results  Component Value Date   INR 1.85* 11/04/2011   INR 1.63* 11/03/2011   INR 1.49 11/02/2011    Recent Radiographic Studies :   Chest 2 View  10/29/2011  *RADIOLOGY REPORT*  Clinical Data: Left total hip replacement.  Ex-smoker.  CHEST - 2 VIEW  Comparison: 03/03/2010.   Findings: Normal sized heart.  Clear lungs.  Stable mild central peribronchial thickening.  Stable calcified granulomata. Unremarkable bones.  IMPRESSION: Stable mild chronic bronchitic changes.  No acute abnormality.   Original Report Authenticated By: Darrol Angel, M.D.    Dg Pelvis Portable  11/02/2011  *RADIOLOGY REPORT*  Clinical Data: Postop left hip.  PORTABLE PELVIS,PORTABLE LEFT HIP - 1 VIEW  Comparison: 10/20/2011 CT.  Findings: Post total left hip replacement which appears in satisfactory position without complication noted.  Radiation seed implants prostate region.  No surrounding sclerotic focus.  IMPRESSION: Post total left hip replacement which appears in satisfactory position without complication noted.   Original Report Authenticated By: Fuller Canada, M.D.  Ct Hip Left Wo Contrast  10/20/2011  *RADIOLOGY REPORT*  Clinical Data: Avascular necrosis.  Hip pain.  CT OF THE LEFT HIP WITHOUT CONTRAST  Technique:  Multidetector CT imaging was performed according to the standard protocol. Multiplanar CT image reconstructions were also generated.  Comparison: 09/17/2011  Findings: Extensive collapse of the left femoral head noted with associated cortical irregularities and erosions in both the acetabulum and the remaining femoral head, and several small ossific fragments along these erosive cavities.  Considerable fluid space noted, as on the prior MRI.  Irregular sclerosis along the acetabulum and tracking into the adjacent left superior pubic ramus noted.  Infection not excluded.  However, given the contralateral AVN, much of the appearance may be secondary to AV and with underlying erosive arthropathy.  There is also muscular in joint capsule calcification favoring heterotopic ossification,  involving the gluteus minimus, biceps femoris, iliopsoas, and joint capsule.  This heterotopic ossification has a ground-glass or osteoid. Tumoral calcinosis in the setting of renal failure might have a  similar appearance although heterotopic ossification is favored.  IMPRESSION:  1.  Prominent collapse of the femoral head and erosion of the acetabulum, with small erosive cavities along the bony surfaces and several fragments loose within the joint. 2.  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 3.  Overall the degree of collapse appears similar to prior. 4.  Given the right at any irregular bony surfaces, especially along the acetabulum, infection is not readily excluded, although reportedly cultures have been negative.   Original Report Authenticated By: Dellia Cloud, M.D.    Dg Hip Portable 1 View Left  11/02/2011  *RADIOLOGY REPORT*  Clinical Data: Postop left hip.  PORTABLE PELVIS,PORTABLE LEFT HIP - 1 VIEW  Comparison: 10/20/2011 CT.  Findings: Post total left hip replacement which appears in satisfactory position without complication noted.  Radiation seed implants prostate region.  No surrounding sclerotic focus.  IMPRESSION: Post total left hip replacement which appears in satisfactory position without complication noted.   Original Report Authenticated By: Fuller Canada, M.D.      Examination:  General appearance: alert, cooperative and mild distress Resp: diminished breath sounds bilaterally Cardio: irregularly irregular rhythm GI: normal findings: bowel sounds normal  Wound Exam: clean, dry, intact   Drainage:  Scant/small amount Serosanguinous exudate  Motor Exam: EHL, FHL, Anterior Tibial and Posterior Tibial Intact  Sensory Exam: Superficial Peroneal, Deep Peroneal and Tibial normal  Vascular Exam: Left posterior tibial artery has trace pulse  Assessment:    2 Days Post-Op  Procedure(s) (LRB): TOTAL HIP ARTHROPLASTY (Left)  ADDITIONAL DIAGNOSIS:  Principal Problem:  *Avascular necrosis of femoral head, s/p LTHR Active Problems:  Prostate cancer  Chronic a-fib  DVT (deep venous thrombosis) Jan 2013   Chronic anticoagulation  CAD, BMS to RCA in '97, occluded RCA and 50% CFX at cath 9/06  HTN (hypertension)  Chronic renal insufficiency, stage III (moderate)  Hypokalemia  Acute Blood Loss Anemia, Hypokalemia and Cardiac Arrythmia AFIB and chronic renal insufficiency   Plan: Physical Therapy as ordered Weight Bearing as Tolerated (WBAT)  DVT Prophylaxis:  Lovenox, Coumadin, Foot Pumps and TED hose  DISCHARGE PLAN: Skilled Nursing Facility/Rehab today if ok with Cardiology.  DISCHARGE NEEDS: HHPT, Walker and 3-in-1 comode seat         Flavius Repsher 11/04/2011 12:58 PM

## 2011-11-04 NOTE — Evaluation (Signed)
Occupational Therapy Evaluation Patient Details Name: Greg Booker MRN: [id number] DOB: 1929/04/26 Today's Date: 11/04/2011 Time: 1308-6578 OT Time Calculation (min): 17 min  OT Assessment / Plan / Recommendation Clinical Impression  Pt. 78 yo male s.p left THA. Pt. plan to d/c to [facility] today, if still here tomorrow OT will follow acutely to address independence with ADL's     OT Assessment  Patient needs continued OT Services    Follow Up Recommendations  Skilled nursing facility    Barriers to Discharge      Equipment Recommendations  None recommended by OT    Recommendations for Other Services    Frequency  Min 2X/week    Precautions / Restrictions Precautions Precautions: Posterior Hip Precaution Booklet Issued: Yes (comment) Precaution Comments: Able to recall 1/3 precautions Required Braces or Orthoses: Knee Immobilizer - Left Restrictions Weight Bearing Restrictions: Yes LLE Weight Bearing: Weight bearing as tolerated   Pertinent Vitals/Pain None reported    ADL  Grooming: Performed;Wash/dry hands;Supervision/safety Where Assessed - Grooming: Unsupported standing Upper Body Bathing: Simulated;Modified independent Where Assessed - Upper Body Bathing: Unsupported sitting Lower Body Bathing: Simulated;Minimal assistance Where Assessed - Lower Body Bathing: Supported sit to stand Upper Body Dressing: Performed;Independent Where Assessed - Upper Body Dressing: Supported sitting Lower Body Dressing: Performed;Minimal assistance Where Assessed - Lower Body Dressing: Supported sit to Pharmacist, hospital: [employer] engineer Method: Sit to Barista: Raised toilet seat without arms Toileting - Clothing Manipulation and Hygiene: Performed;Independent Where Assessed - Toileting Clothing Manipulation and Hygiene: Standing Equipment Used: Gait belt;Rolling walker Transfers/Ambulation Related to ADLs: Pt. min (A) for  transfers due to decrease balance. Min guard for ambulation  ADL Comments: Pt. educated on AE to assist with LB bathing and dressing. Able to complete LB dressing with min (A) using a reacher. Pt. able to complete grooming tasks at sink level with supervision. Pt. able to recall 1/3 precautions, re-educated on posterior hip precautions and given handout.     OT Diagnosis: Generalized weakness;Acute pain  OT Problem List: Decreased strength;Decreased safety awareness;Decreased knowledge of use of DME or AE;Decreased knowledge of precautions;Pain OT Treatment Interventions: Therapeutic exercise;Self-care/ADL training;Therapeutic activities;Patient/family education   OT Goals Acute Rehab OT Goals OT Goal Formulation: With patient Time For Goal Achievement: 11/18/11 Potential to Achieve Goals: Good ADL Goals Pt Will Perform Lower Body Bathing: with modified independence;Sit to stand from chair;with 2+ total assist;with adaptive equipment ADL Goal: Lower Body Bathing - Progress: Goal set today Pt Will Perform Lower Body Dressing: with modified independence;with adaptive equipment;Sit to stand from chair ADL Goal: Lower Body Dressing - Progress: Goal set today Miscellaneous OT Goals Miscellaneous OT Goal #1: Pt. will be mod independence with bed mobility as precurser to EOB ADL's.  OT Goal: Miscellaneous Goal #1 - Progress: Goal set today  Visit Information  Last OT Received On: 11/04/11 Assistance Needed: +1    Subjective Data  Subjective: I am leaving today for [facility].  Patient Stated Goal: To not be so sore   Prior Functioning     Home Living Type of Home: House Home Layout: One level Home Adaptive Equipment: Walker - rolling Prior Function Level of Independence: Independent with assistive device(s) Able to Take Stairs?: Yes Communication Communication: No difficulties Dominant Hand: Right         Vision/Perception     Cognition  Overall Cognitive Status: Appears  within functional limits for tasks assessed/performed Arousal/Alertness: Awake/alert Orientation Level: Appears intact for tasks assessed Behavior During Session:  WFL for tasks performed    Extremity/Trunk Assessment Right Upper Extremity Assessment RUE ROM/Strength/Tone: [facility] for tasks assessed Left Upper Extremity Assessment LUE ROM/Strength/Tone: [facility] for tasks assessed     Mobility Bed Mobility Bed Mobility: Supine to Sit;Sitting - Scoot to Edge of Bed Supine to Sit: 2: Max assist Sitting - Scoot to [place] of Bed: 4: Min assist Details for Bed Mobility Assistance: (A) to bring shoulder OOB and to support LLE. Max verbal cues for sequencing and hand placement.  Transfers Transfers: Sit to Stand;Stand to Sit Sit to Stand: 4: Min assist;With upper extremity assist;From bed Stand to Sit: 4: Min assist;With upper extremity assist;To chair/3-in-1 Details for Transfer Assistance: Cues for technique and hand placement.                     End of Session OT - End of Session Equipment Utilized During Treatment: Gait belt;Left knee immobilizer Activity Tolerance: Patient tolerated treatment well Patient left: in chair;with call bell/phone within reach;with family/visitor present Nurse Communication: Mobility status  GO     Cleora Fleet 11/04/2011, 2:21 PM

## 2011-11-04 NOTE — Progress Notes (Signed)
Agree with PT treatment note.  Anthoni Geerts, PT DPT [phone]  

## 2011-11-04 NOTE — Progress Notes (Signed)
Subjective:  No chest pain or SOB  Objective:  Vital Signs in the last 24 hours: Temp:  [98.7 F (37.1 C)-101.9 F (38.8 C)] 98.7 F (37.1 C) (10/24 0525) Pulse Rate:  [66-92] 66  (10/24 0525) Resp:  [16-20] 18  (10/24 0800) BP: (112-113)/(47-53) 112/47 mmHg (10/24 0525) SpO2:  [96 %-98 %] 98 % (10/24 0800)  Intake/Output from previous day:  Intake/Output Summary (Last 24 hours) at 11/04/11 1008 Last data filed at 11/04/11 0530  Gross per 24 hour  Intake    150 ml  Output   1400 ml  Net  -1250 ml    Physical Exam: General appearance: alert, cooperative and no distress Lungs: clear to auscultation bilaterally Heart: irregularly irregular rhythm   Rate: 68  Rhythm: atrial fibrillation  Lab Results:  Basename 11/04/11 0635 11/03/11 0605  WBC 11.0* 9.3  HGB 9.5* 9.8*  PLT 199 188    Basename 11/04/11 0635 11/03/11 0605  NA 137 140  K 3.3* 4.2  CL 103 105  CO2 25 24  GLUCOSE 131* 137*  BUN 15 17  CREATININE 1.46* 1.51*   No results found for this basename: TROPONINI:2,CK,MB:2 in the last 72 hours Hepatic Function Panel No results found for this basename: PROT,ALBUMIN,AST,ALT,ALKPHOS,BILITOT,BILIDIR,IBILI in the last 72 hours No results found for this basename: CHOL in the last 72 hours  Basename 11/04/11 0635  INR 1.85*    Imaging: Imaging results have been reviewed  Cardiac Studies:  Assessment/Plan:   Principal Problem:  *Avascular necrosis of femoral head, s/p LTHR Active Problems:  Chronic a-fib  DVT (deep venous thrombosis) Jan 2013  Chronic anticoagulation  Chronic renal insufficiency, stage III (moderate)  Hypokalemia  Prostate cancer  CAD, BMS to RCA in '97, occluded RCA and 50% CFX at cath 9/06  HTN (hypertension)   Plan- EKG pending. He is back on Coumadin. HR stable off Lanoxin. OK to go to rehab from our standpoint. We will be available as needed.   [employer] PA-C 11/04/2011, 10:08 AM

## 2011-11-04 NOTE — Progress Notes (Signed)
PT Cancellation Note  Patient Details Name: KAZIMIR HARTNETT MRN: [id number] DOB: August 30, 1929   Cancelled Treatment:    Reason Eval/Treat Not Completed: Other (comment) (Being transported to SNF).  Pt was preparing for transfer to SNF and preferred to wait for transport instead of participating in therapy.   Zayaan Kozak 11/04/2011, 2:15 PM

## 2011-11-04 NOTE — Progress Notes (Signed)
ANTICOAGULATION CONSULT NOTE - Follow-up Consult  Pharmacy Consult for Coumadin and Lovenox Indication: VTE prophylaxis / PAF  Allergies  Allergen Reactions  . Lovastatin Other (See Comments)    Leg weakness  . Codeine Rash    Patient Measurements: Weight = 82 kg  Labs:  Basename 11/04/11 0635 11/03/11 0605 11/02/11 0705  HGB 9.5* 9.8* --  HCT 27.5* 28.6* --  PLT 199 188 --  APTT -- -- 36  LABPROT 20.7* 18.8* 17.6*  INR 1.85* 1.63* 1.49  HEPARINUNFRC -- -- --  CREATININE 1.46* 1.51* --  CKTOTAL -- -- --  CKMB -- -- --  TROPONINI -- -- --    CrCl is unknown because there is no height on file for the current visit.   Medical History: Past Medical History  Diagnosis Date  . GERD (gastroesophageal reflux disease)   . Coronary artery disease CARDIOLOGIST - DR LITTLE-- LAST VISIT 2 MON AGO    DENIES S & S  . Hyperlipidemia   . S/P primary angioplasty with coronary stent   . Calcified granuloma of lung RIGHT LUNG BASE - STABLE PER CXR 03-03-2010  . Mitral regurgitation   . Chronic back pain   . Sciatic nerve pain LEFT LEG PAIN--  EPI INJECTIONS  . Low back pain radiating to left leg   . Numbness and tingling of left leg   . Impaired hearing BILATERAL AIDS  . Nocturia   . Prostate cancer RECUR    S/P RADIATIVE SEEDS 2006  . Anticoagulated on Coumadin   . PAF (paroxysmal atrial fibrillation)   . Heart murmur   . Peripheral vascular disease     dvt lft leg 10 yrs ago  . Arthritis    Assessment: 78 yo male here with avascular necrosis and s/p left THA. He is on  Coumadin PTA for PAF, alternating 2.5 with 5 mg doses every other day (CHADS=2), bridged w/ lovenox pre-op (post-op DVT 02/2011). Patient to continue coumadin post-op for VTE prophylaxis and A-fib.  Day 3 of coumadin restart. INR today 1.85, H/H stable, platelets WNL; no signs of current bleeding.  Goal of Therapy:  INR 2-3 Monitor platelets by anticoagulation protocol: Yes   Plan:  1) Coumadin 2.5 mg po  x 1 2) Daily INR  Bernadene Person PharmD Candidate 11/04/2011 8:18 AM  I have reviewed the above and agree with the plan.  Harland German, Pharm D 11/04/2011 9:17 AM

## 2011-11-04 NOTE — Progress Notes (Signed)
Physical Therapy Treatment Patient Details Name: Greg Booker MRN: [id number] DOB: 01-10-1930 Today's Date: 11/04/2011 Time: 6213-0865 PT Time Calculation (min): 42 min  PT Assessment / Plan / Recommendation Comments on Treatment Session  Pt required more (A) with bed mobility and transfers today compared to yesterday.  Due to decreased care giver support and inablity to rise OOB pt would benefit from SNF placement prior to d/c home.    Follow Up Recommendations  Post acute inpatient     Does the patient have the potential to tolerate intense rehabilitation  No, Recommend SNF  Barriers to Discharge        Equipment Recommendations  None recommended by PT    Recommendations for Other Services Other (comment) (None)  Frequency 7X/week   Plan Discharge plan remains appropriate;Frequency remains appropriate    Precautions / Restrictions Precautions Precautions: Posterior Hip Required Braces or Orthoses: Knee Immobilizer - Left Restrictions Weight Bearing Restrictions: Yes LLE Weight Bearing: Weight bearing as tolerated   Pertinent Vitals/Pain Did not rate pain but stated pain in L LE.    Mobility  Bed Mobility Bed Mobility: Supine to Sit;Sitting - Scoot to Edge of Bed Supine to Sit: 2: Max assist Sitting - Scoot to [place] of Bed: 4: Min assist Details for Bed Mobility Assistance: (A) for lifting LE OOB and supporting the upper body to an upright position.  Max cues for hand placement, LE placement,proper technique, and safety. Transfers Transfers: Sit to Stand;Stand to Sit Sit to Stand: 4: Min assist;With upper extremity assist;From bed Stand to Sit: 4: Min assist;With upper extremity assist;To chair/3-in-1 Details for Transfer Assistance: (A) with initiating mvt and slowing descent.  Cues for safety and proper technique. Ambulation/Gait Ambulation/Gait Assistance: 4: Min guard Ambulation Distance (Feet): 180 Feet Assistive device: Rolling walker Ambulation/Gait  Assistance Details: Cues for proper technique, safety, and posture. Gait Pattern: Step-to pattern;Antalgic;Decreased stride length Gait velocity: decreased Stairs: No Wheelchair Mobility Wheelchair Mobility: No    Exercises Total Joint Exercises Ankle Circles/Pumps: AROM;Both;10 reps Quad Sets: AROM;Left;10 reps Heel Slides: AAROM;Left;10 reps Hip ABduction/ADduction: AAROM;Left;5 reps   PT Diagnosis:    PT Problem List:   PT Treatment Interventions:     PT Goals Acute Rehab PT Goals PT Goal Formulation: With patient Time For Goal Achievement: 11/10/11 Potential to Achieve Goals: Fair Pt will go Supine/Side to Sit: with supervision;with HOB 0 degrees PT Goal: Supine/Side to Sit - Progress: Not met Pt will go Sit to Stand: with supervision;with upper extremity assist PT Goal: Sit to Stand - Progress: Progressing toward goal Pt will go Stand to Sit: with supervision;with upper extremity assist PT Goal: Stand to Sit - Progress: Progressing toward goal Pt will Ambulate: 51 - 150 feet;with supervision;with least restrictive assistive device PT Goal: Ambulate - Progress: Met Pt will Perform Home Exercise Program: with supervision, verbal cues required/provided PT Goal: Perform Home Exercise Program - Progress: Progressing toward goal  Visit Information  Last PT Received On: 11/04/11 Assistance Needed: +1    Subjective Data  Subjective: I did not get a very good sleep last night. Patient Stated Goal: To get better.   Cognition  Overall Cognitive Status: Appears within functional limits for tasks assessed/performed Arousal/Alertness: Awake/alert Orientation Level: Appears intact for tasks assessed Behavior During Session: [facility] for tasks performed    Balance  Balance Balance Assessed: No  End of Session PT - End of Session Equipment Utilized During Treatment: Gait belt;Left knee immobilizer Activity Tolerance: Patient tolerated treatment well Patient left: in chair;with  call bell/phone within reach Nurse Communication: Mobility status   GP     Kile Kabler 11/04/2011, 9:43 AM

## 2011-11-23 NOTE — Progress Notes (Signed)
Reviewed

## 2012-03-27 ENCOUNTER — Ambulatory Visit: Payer: Self-pay | Admitting: Internal Medicine

## 2012-06-15 ENCOUNTER — Ambulatory Visit (INDEPENDENT_AMBULATORY_CARE_PROVIDER_SITE_OTHER): Payer: Medicare Other | Admitting: Pharmacist Clinician (PhC)/ Clinical Pharmacy Specialist

## 2012-06-15 VITALS — BP 138/80 | HR 72

## 2012-06-15 DIAGNOSIS — I482 Chronic atrial fibrillation, unspecified: Secondary | ICD-10-CM

## 2012-06-15 DIAGNOSIS — I4891 Unspecified atrial fibrillation: Secondary | ICD-10-CM

## 2012-06-15 DIAGNOSIS — Z7901 Long term (current) use of anticoagulants: Secondary | ICD-10-CM

## 2012-07-13 ENCOUNTER — Ambulatory Visit (INDEPENDENT_AMBULATORY_CARE_PROVIDER_SITE_OTHER): Payer: Medicare Other | Admitting: Pharmacist Clinician (PhC)/ Clinical Pharmacy Specialist

## 2012-07-13 VITALS — BP 122/56 | HR 68

## 2012-07-13 DIAGNOSIS — I4891 Unspecified atrial fibrillation: Secondary | ICD-10-CM

## 2012-07-13 DIAGNOSIS — Z7901 Long term (current) use of anticoagulants: Secondary | ICD-10-CM

## 2012-07-13 DIAGNOSIS — I482 Chronic atrial fibrillation, unspecified: Secondary | ICD-10-CM

## 2012-07-13 LAB — POCT INR: INR: 2.6

## 2012-08-01 ENCOUNTER — Ambulatory Visit (INDEPENDENT_AMBULATORY_CARE_PROVIDER_SITE_OTHER): Payer: Medicare Other | Admitting: Pharmacist Clinician (PhC)/ Clinical Pharmacy Specialist

## 2012-08-01 VITALS — BP 130/60 | HR 64

## 2012-08-01 DIAGNOSIS — I4891 Unspecified atrial fibrillation: Secondary | ICD-10-CM

## 2012-08-01 DIAGNOSIS — I482 Chronic atrial fibrillation, unspecified: Secondary | ICD-10-CM

## 2012-08-01 DIAGNOSIS — Z7901 Long term (current) use of anticoagulants: Secondary | ICD-10-CM

## 2012-08-01 LAB — POCT INR: INR: 2.7

## 2012-08-04 ENCOUNTER — Encounter: Payer: Self-pay | Admitting: *Deleted

## 2012-08-07 ENCOUNTER — Ambulatory Visit (INDEPENDENT_AMBULATORY_CARE_PROVIDER_SITE_OTHER): Payer: Medicare Other | Admitting: Internal Medicine

## 2012-08-07 ENCOUNTER — Encounter: Payer: Self-pay | Admitting: Internal Medicine

## 2012-08-07 VITALS — BP 126/56 | HR 54 | Ht 66.0 in | Wt 181.6 lb

## 2012-08-07 DIAGNOSIS — I4891 Unspecified atrial fibrillation: Secondary | ICD-10-CM

## 2012-08-07 DIAGNOSIS — I482 Chronic atrial fibrillation, unspecified: Secondary | ICD-10-CM

## 2012-08-07 DIAGNOSIS — I82409 Acute embolism and thrombosis of unspecified deep veins of unspecified lower extremity: Secondary | ICD-10-CM

## 2012-08-07 DIAGNOSIS — I2510 Atherosclerotic heart disease of native coronary artery without angina pectoris: Secondary | ICD-10-CM

## 2012-08-07 NOTE — Progress Notes (Signed)
OFFICE NOTE  Chief Complaint:  Routine follow-up  Primary Care Physician: Alva Garnet., MD  HPI:  Greg Booker is an 79 year old gentleman followed by Dr. Clarene Duke with history of coronary disease in the past. Cardiac catheterization in 2006 showed an occluded right coronary with collaterals, EF 50%. A stress test in 2010 most recently was negative for ischemia. He has had trouble with DVT and has been off Coumadin for periods of time. Recently he had hip surgery, which he underwent uneventfully and has done well and is back on his Coumadin. Unfortunately, his levels were too high; as of January 3 his INR was 6.9.  It now seems to be more regulated. He is describing no symptoms of bleeding, shortness of breath, palpitations, presyncope, syncopal symptoms, chest pain, or any associated cardiac findings.   PMHx:  Past Medical History  Diagnosis Date  . GERD (gastroesophageal reflux disease)   . Coronary artery disease CARDIOLOGIST - DR LITTLE-- LAST VISIT 2 MON AGO    DENIES S & S  . Hyperlipidemia   . S/P primary angioplasty with coronary stent   . Calcified granuloma of lung RIGHT LUNG BASE - STABLE PER CXR 03-03-2010  . Mitral regurgitation   . Chronic back pain   . Sciatic nerve pain LEFT LEG PAIN--  EPI INJECTIONS  . Low back pain radiating to left leg   . Numbness and tingling of left leg   . Impaired hearing BILATERAL AIDS  . Nocturia   . Prostate cancer RECUR    S/P RADIATIVE SEEDS 2006  . Anticoagulated on Coumadin   . PAF (paroxysmal atrial fibrillation)   . Heart murmur   . Peripheral vascular disease     dvt lft leg 10 yrs ago  . Arthritis     Past Surgical History  Procedure Laterality Date  . Laminectomy  10-28-2008    L3 - 4  . Excision benign breast mass  08-06-2008    LEFT  . Cardioversion  09-15-2005    UNSUCCESSFUL  . Radioactive seed implant  08-03-2004    PROSTATE  . Cardiovascular stress test  06-03-2008    NORMAL STUDY/ LV NORMAL /  COMPARED TO 12-26-2000 INFERIOR WALL ISCHEMIA IS NO LONGER PRESENT.  . Transthoracic echocardiogram  07-26-2007    LVSF NORMAL/ EF 55%/ LEFT ATRIUM 4.5CM MILD TO MOD. DILATED/ MILD  MITRAL & TRICUSPID REGURG  . Cryoablation  03/01/2011    Procedure: CRYO ABLATION PROSTATE;  Surgeon: Kathi Ludwig, MD;  Location: [facility];  Service: Urology;  Laterality: N/A;  . Coronary angioplasty with stent placement  1997- DR AL LITTLE    X1 STENT RCA  . Cardiac catheterization  1998    IN-STENT RESTENOSIS-- LARGE COLLATERALS  . Cardiac catheterization  2003  &  2006    2006 ---  MITRAL REGURG/ 100% OCCULSION RCA WITH LARGE COLLATERALS/ MILD DISEASE LAD & CIRCUMFLEX  . Total hip arthroplasty  11/02/2011    Procedure: TOTAL HIP ARTHROPLASTY;  Surgeon: Valeria Batman, MD;  Location: [facility] OR;  Service: Orthopedics;  Laterality: Left;  Left Total Hip Replacement    FAMHx:  Family History  Problem Relation Age of Onset  . Cancer Sister   . Heart Problems Father   . Heart Problems Brother     SOCHx:   reports that he quit smoking about 40 years ago. His smoking use included Cigarettes and Pipe. He has a 25 pack-year smoking history. He has never used smokeless tobacco. He reports  that he does not drink alcohol or use illicit drugs.  ALLERGIES:  Allergies  Allergen Reactions  . Lovastatin Other (See Comments)    Leg weakness  . Pravastatin Other (See Comments)    myalgias  . Codeine Rash    ROS: A comprehensive review of systems was negative except for: Musculoskeletal: positive for stiff joints  HOME MEDS: Current Outpatient Prescriptions  Medication Sig Dispense Refill  . acetaminophen (TYLENOL) 500 MG tablet Take 500 mg by mouth every 6 (six) hours as needed for pain.      Marland Kitchen b complex vitamins tablet Take 1 tablet by mouth daily.      . bicalutamide (CASODEX) 50 MG tablet Take 50 mg by mouth 3 (three) times daily.      . Cholecalciferol (VITAMIN D3) 1000 UNITS CAPS  Take 1,000 Units by mouth daily.       . digoxin (LANOXIN) 0.125 MG tablet Take 0.125 mg by mouth daily.      Marland Kitchen diltiazem (DILACOR XR) 240 MG 24 hr capsule Take 240 mg by mouth every morning.      . furosemide (LASIX) 40 MG tablet Take 40 mg by mouth daily.      . Glucosamine-Chondroit-Vit C-Mn (GLUCOSAMINE 1500 COMPLEX PO) Take by mouth daily.      . isosorbide mononitrate (IMDUR) 60 MG 24 hr tablet Take 60 mg by mouth every morning.      Marland Kitchen lisinopril (PRINIVIL,ZESTRIL) 10 MG tablet Take 10 mg by mouth daily.      . metoprolol succinate (TOPROL-XL) 50 MG 24 hr tablet Take 50 mg by mouth every morning. Take with or immediately following a meal.      . niacin 500 MG tablet Take 500 mg by mouth daily with breakfast.      . omeprazole (PRILOSEC) 20 MG capsule Take 20 mg by mouth daily.      Marland Kitchen oxybutynin (DITROPAN) 5 MG tablet Take 5 mg by mouth 2 (two) times daily.       . Sennosides 8.6 MG CAPS Take 1 capsule by mouth daily.      . Tamsulosin HCl (FLOMAX) 0.4 MG CAPS Take 0.4 mg by mouth daily after supper.      . vitamin C (ASCORBIC ACID) 500 MG tablet Take 500 mg by mouth daily.      Marland Kitchen warfarin (COUMADIN) 2.5 MG tablet Take 1 tablet (2.5 mg total) by mouth one time only at 6 PM. Pharmacist to monitor INR and prescribe Coumadin as appropriate.  30 tablet  0   No current facility-administered medications for this visit.    LABS/IMAGING: No results found for this or any previous visit (from the past 48 hour(s)). No results found.  VITALS: BP 126/56  Pulse 54  Ht 5\' 6"  (1.676 m)  Wt 181 lb 9.6 oz (82.373 kg)  BMI 29.32 kg/m2  EXAM: General appearance: alert and no distress Neck: no adenopathy, no carotid bruit, no JVD, supple, symmetrical, trachea midline and thyroid not enlarged, symmetric, no tenderness/mass/nodules Lungs: clear to auscultation bilaterally Heart: irregularly irregular rhythm Abdomen: soft, non-tender; bowel sounds normal; no masses,  no organomegaly Extremities:  extremities normal, atraumatic, no cyanosis or edema Pulses: 2+ and symmetric Skin: Skin color, texture, turgor normal. No rashes or lesions Neurologic: Grossly normal  EKG: Atrial fibrillation with slow ventricular response of 54  ASSESSMENT: 1. Chronic atrial fibrillation 2. Coronary artery disease with occluded right coronary left to right collaterals, EF 50% 3. Recent hip replacement 4. Hypertension 5. Dyslipidemia  6. Overweight  PLAN: 1.   Mr. Lindon is doing fairly well after his hip replacement. He has chronic AF and his rate is somewhat slow today on a number of medications including high-dose diltiazem metoprolol and digoxin. He may be able to wean some of those down if his heart rate starts to get lower and he is symptomatic, but for now I would continue on his current medications. His INR is fairly stable. He denies any chest pain and is fairly active. Plan to see him back in 6 months or sooner as necessary.  Chrystie Nose, MD, [facility] Attending Cardiologist [facility]  Jakaiden Fill C 08/07/2012, 12:46 PM

## 2012-08-07 NOTE — Patient Instructions (Addendum)
Your physician wants you to follow-up in:  6 months. You will receive a reminder letter in the mail two months in advance. If you don't receive a letter, please call our office to schedule the follow-up appointment.   

## 2012-08-10 ENCOUNTER — Telehealth: Payer: Self-pay | Admitting: *Deleted

## 2012-08-10 ENCOUNTER — Encounter: Payer: Self-pay | Admitting: Internal Medicine

## 2012-08-10 NOTE — Telephone Encounter (Signed)
Faxed signed documentation from Dr. Rennis Golden r/t holding Warfarin x 5 days prior to procedure to [facility] on 08/10/12

## 2012-08-29 ENCOUNTER — Other Ambulatory Visit: Payer: Self-pay | Admitting: Urology

## 2012-08-29 ENCOUNTER — Ambulatory Visit (INDEPENDENT_AMBULATORY_CARE_PROVIDER_SITE_OTHER): Payer: Medicare Other | Admitting: Pharmacist Clinician (PhC)/ Clinical Pharmacy Specialist

## 2012-08-29 VITALS — BP 130/62 | HR 68

## 2012-08-29 DIAGNOSIS — I482 Chronic atrial fibrillation, unspecified: Secondary | ICD-10-CM

## 2012-08-29 DIAGNOSIS — I4891 Unspecified atrial fibrillation: Secondary | ICD-10-CM

## 2012-08-29 DIAGNOSIS — IMO0002 Reserved for concepts with insufficient information to code with codable children: Secondary | ICD-10-CM

## 2012-08-29 DIAGNOSIS — N644 Mastodynia: Secondary | ICD-10-CM

## 2012-08-29 DIAGNOSIS — Z7901 Long term (current) use of anticoagulants: Secondary | ICD-10-CM

## 2012-08-29 LAB — POCT INR: INR: 2.6

## 2012-09-14 ENCOUNTER — Ambulatory Visit
Admission: RE | Admit: 2012-09-14 | Discharge: 2012-09-14 | Disposition: A | Discharge status: Home / Self Care | Payer: Medicare Other | Source: Ambulatory Visit | Attending: Urology | Admitting: Urology

## 2012-09-14 DIAGNOSIS — IMO0002 Reserved for concepts with insufficient information to code with codable children: Secondary | ICD-10-CM

## 2012-09-14 DIAGNOSIS — N644 Mastodynia: Secondary | ICD-10-CM

## 2012-09-26 ENCOUNTER — Ambulatory Visit (INDEPENDENT_AMBULATORY_CARE_PROVIDER_SITE_OTHER): Payer: Medicare Other | Admitting: Pharmacist Clinician (PhC)/ Clinical Pharmacy Specialist

## 2012-09-26 VITALS — BP 156/62 | HR 76

## 2012-09-26 DIAGNOSIS — I4891 Unspecified atrial fibrillation: Secondary | ICD-10-CM

## 2012-09-26 DIAGNOSIS — Z7901 Long term (current) use of anticoagulants: Secondary | ICD-10-CM

## 2012-09-26 DIAGNOSIS — I482 Chronic atrial fibrillation, unspecified: Secondary | ICD-10-CM

## 2012-09-27 ENCOUNTER — Other Ambulatory Visit: Payer: Self-pay | Admitting: Pharmacist Clinician (PhC)/ Clinical Pharmacy Specialist

## 2012-09-27 ENCOUNTER — Telehealth: Payer: Self-pay | Admitting: Pharmacist Clinician (PhC)/ Clinical Pharmacy Specialist

## 2012-09-27 MED ORDER — ENOXAPARIN SODIUM 80 MG/0.8ML ~~LOC~~ SOLN: 80.0000 mg | Freq: Two times a day (BID) | SUBCUTANEOUS | Status: DC

## 2012-09-27 NOTE — Telephone Encounter (Signed)
lovenox bridge set for upcoming colonoscopy on 10-13

## 2012-10-13 ENCOUNTER — Encounter: Payer: Self-pay | Admitting: Radiation Oncology

## 2012-10-13 NOTE — Progress Notes (Signed)
Histology and Location of Primary Cancer: Adenocarcinoma of prostate  Location(s) of Symptomatic tumor(s): bilateral breast  Past/Anticipated chemotherapy by medical oncology, if any: N/A  Patient's main complaints related to symptomatic tumor(s) are: bilateral breast tenderness, bilateral breast enlargement, denies nipple discharge  Pain on a scale of 0-10 is: bilateral breast tenderness    If Spine Met(s), symptoms, if any, include:  Bowel/Bladder retention or incontinence (please describe): urinary frequency, urinary urgency, and nocturia  Numbness or weakness in extremities (please describe): None noted  Current Decadron regimen, if applicable: N/A  Ambulatory status? Walker? Wheelchair?: Ambulatory  SAFETY ISSUES:  Prior radiation? Yes  06/11/2004-07/16/2004 External beam radiotherapy prior to seed implant boost. Prostate and periprostatic tissues  using a 4 field treatment arrangement was treated to 45 Gy in 25 fractions of 1.8 Gy.  Pacemaker/ICD? NO  Possible current pregnancy? NO  Is the patient on methotrexate? NO  Additional Complaints / other details:  79 year old male. Widowed. Retired. Referred back by Dr. Patsi Sears to be evaluated for breast irradiation.

## 2012-10-16 ENCOUNTER — Ambulatory Visit
Admission: RE | Admit: 2012-10-16 | Discharge: 2012-10-16 | Disposition: A | Discharge status: Home / Self Care | Payer: Medicare Other | Source: Ambulatory Visit | Attending: Radiation Oncology | Admitting: Radiation Oncology

## 2012-10-16 ENCOUNTER — Ambulatory Visit: Payer: Medicare Other | Admitting: Radiation Oncology

## 2012-10-16 ENCOUNTER — Ambulatory Visit: Payer: Medicare Other

## 2012-10-16 ENCOUNTER — Encounter: Payer: Self-pay | Admitting: Radiation Oncology

## 2012-10-16 VITALS — BP 124/54 | HR 54 | Temp 98.1°F | Ht 69.0 in | Wt 180.3 lb

## 2012-10-16 DIAGNOSIS — E785 Hyperlipidemia, unspecified: Secondary | ICD-10-CM | POA: Insufficient documentation

## 2012-10-16 DIAGNOSIS — N62 Hypertrophy of breast: Secondary | ICD-10-CM | POA: Insufficient documentation

## 2012-10-16 DIAGNOSIS — K219 Gastro-esophageal reflux disease without esophagitis: Secondary | ICD-10-CM | POA: Insufficient documentation

## 2012-10-16 DIAGNOSIS — Z79899 Other long term (current) drug therapy: Secondary | ICD-10-CM | POA: Insufficient documentation

## 2012-10-16 DIAGNOSIS — I059 Rheumatic mitral valve disease, unspecified: Secondary | ICD-10-CM | POA: Insufficient documentation

## 2012-10-16 DIAGNOSIS — I2510 Atherosclerotic heart disease of native coronary artery without angina pectoris: Secondary | ICD-10-CM | POA: Insufficient documentation

## 2012-10-16 DIAGNOSIS — I4891 Unspecified atrial fibrillation: Secondary | ICD-10-CM | POA: Insufficient documentation

## 2012-10-16 DIAGNOSIS — Z7901 Long term (current) use of anticoagulants: Secondary | ICD-10-CM | POA: Insufficient documentation

## 2012-10-16 DIAGNOSIS — C61 Malignant neoplasm of prostate: Secondary | ICD-10-CM

## 2012-10-16 NOTE — Progress Notes (Signed)
Patient widowed. Here today with his male friend and niece for consult to discuss tender and enlarged breast from Casodex. Stopped taking casodex approximately 3 months ago. Reports intermittent weakness in his leg. Colonoscopy scheduled for 10/13 therefore he is taking lovenox to prepare for going of digoxin. Reports soreness around nipple. Denies fever in breast. Denies nipple discharge. Denies nausea, vomiting, headache, night sweats or dizziness. Reports that he remain active riding his three wheeled motorcycle with his lady friend and traveling.

## 2012-10-16 NOTE — Progress Notes (Signed)
See progress note under physician encounter. 

## 2012-10-16 NOTE — Progress Notes (Signed)
Please see the Nurse Progress Note in the MD Initial Consult Encounter for this patient. 

## 2012-10-16 NOTE — Progress Notes (Signed)
Complete PATIENT MEASURE OF DISTRESS worksheet with a score of 0 submitted to social work.  

## 2012-10-17 ENCOUNTER — Encounter: Payer: Self-pay | Admitting: Radiation Oncology

## 2012-10-17 ENCOUNTER — Ambulatory Visit: Payer: Medicare Other | Admitting: Radiation Oncology

## 2012-10-17 ENCOUNTER — Ambulatory Visit: Payer: Medicare Other

## 2012-10-17 ENCOUNTER — Ambulatory Visit
Admission: RE | Admit: 2012-10-17 | Discharge: 2012-10-17 | Disposition: A | Discharge status: Home / Self Care | Payer: Medicare Other | Source: Ambulatory Visit | Attending: Radiation Oncology | Admitting: Radiation Oncology

## 2012-10-17 DIAGNOSIS — Z510 Encounter for antineoplastic radiation therapy: Secondary | ICD-10-CM | POA: Insufficient documentation

## 2012-10-17 DIAGNOSIS — N62 Hypertrophy of breast: Secondary | ICD-10-CM | POA: Insufficient documentation

## 2012-10-17 DIAGNOSIS — T50905A Adverse effect of unspecified drugs, medicaments and biological substances, initial encounter: Secondary | ICD-10-CM

## 2012-10-17 DIAGNOSIS — C61 Malignant neoplasm of prostate: Secondary | ICD-10-CM

## 2012-10-17 NOTE — Progress Notes (Signed)
  Radiation Oncology         [phone] ________________________________  Name: Greg Booker MRN: [id number]  Date: 10/17/2012  DOB: [birth year]  SIMULATION AND TREATMENT PLANNING NOTE  DIAGNOSIS:  79 yo gentleman with castration sensitive recurrent prostate cancer and painful gynecomastia  NARRATIVE:  The patient was brought to the CT Simulation planning suite.  Identity was confirmed.  All relevant records and images related to the planned course of therapy were reviewed.  The patient freely provided informed written consent to proceed with treatment after reviewing the details related to the planned course of therapy. The consent form was witnessed and verified by the simulation staff.  Then, the patient was set-up in a stable reproducible  supine position for radiation therapy.  CT images were obtained.  Surface markings were placed.  The CT images were loaded into the planning software.  Then the target and avoidance structures were contoured.  Treatment planning then occurred.  The radiation prescription was entered and confirmed.  Then, I designed and supervised the construction of a total of 2 medically necessary complex treatment devices to shape radiation around the breast tissue bilaterally while shielding critical structures.  I have requested : special port plans for electron dosimetry.    PLAN:  The patient will receive 12 Gy in 3 fractions.  ________________________________  Artist Pais Kathrynn Running, M.D.

## 2012-10-17 NOTE — Progress Notes (Signed)
Radiation Oncology         (336) [phone] ________________________________  Initial outpatient Consultation  Name: Greg Booker MRN: [id number]  Date: 10/16/2012  DOB: 1929/01/19  YQ:MVHQION,GEXBMWUX R., MD  Alva Garnet., MD   REFERRING PHYSICIAN: Alva Garnet., MD  DIAGNOSIS: 79 yo gentleman with castration sensitive recurrent prostate cancer and painful gynecomastia  HISTORY OF PRESENT ILLNESS::Greg Booker is a 79 y.o. male who was treated with radiotherapy and neoadjuvant/concurrent androgen deprivation in 2006 for high risk localized T2a adenocarcinoma of the prostate with Gleason's 8 and PSA 7.11.  He had experienced biochemical failure.  Re-biopsy of the prostate showed one positive core with Gleason's 4+3 and the patient underwent salvage cryotherapy on 03/01/11.  His PSA has continued to rise and he was started casodex 06/21/12.  He developed breast pain and swelling in 8/14, and casodex was discontinued.  Mammography on 09/14/12 was benign.  He has been referred today to discuss possible radiotherapy for gynecomastia.  PREVIOUS RADIATION THERAPY: Yes - Prostate treated to 45 Gy in 25 fractions 06/11/04-07/16/04 followed by I-125 seed implant with post implant D90 = 99% reflecting good quality implant.  PAST MEDICAL HISTORY:  has a past medical history of GERD (gastroesophageal reflux disease); Coronary artery disease (CARDIOLOGIST - DR LITTLE-- LAST VISIT 2 MON AGO); Hyperlipidemia; S/P primary angioplasty with coronary stent; Calcified granuloma of lung (RIGHT LUNG BASE - STABLE PER CXR 03-03-2010); Mitral regurgitation; Chronic back pain; Sciatic nerve pain (LEFT LEG PAIN--  EPI INJECTIONS); Low back pain radiating to left leg; Numbness and tingling of left leg; Impaired hearing (BILATERAL AIDS); Nocturia; Prostate cancer (RECUR); Anticoagulated on Coumadin; PAF (paroxysmal atrial fibrillation); Heart murmur; Peripheral vascular disease; Arthritis; and S/P radiation therapy.      PAST SURGICAL HISTORY: Past Surgical History  Procedure Laterality Date  . Laminectomy  10-28-2008    L3 - 4  . Excision benign breast mass  08-06-2008    LEFT  . Cardioversion  09-15-2005    UNSUCCESSFUL  . Radioactive seed implant  08-03-2004    PROSTATE  . Cardiovascular stress test  06-03-2008    NORMAL STUDY/ LV NORMAL / COMPARED TO 12-26-2000 INFERIOR WALL ISCHEMIA IS NO LONGER PRESENT.  . Transthoracic echocardiogram  07-26-2007    LVSF NORMAL/ EF 55%/ LEFT ATRIUM 4.5CM MILD TO MOD. DILATED/ MILD  MITRAL & TRICUSPID REGURG  . Cryoablation  03/01/2011    Procedure: CRYO ABLATION PROSTATE;  Surgeon: Kathi Ludwig, MD;  Location: [facility];  Service: Urology;  Laterality: N/A;  . Coronary angioplasty with stent placement  1997- DR AL LITTLE    X1 STENT RCA  . Cardiac catheterization  1998    IN-STENT RESTENOSIS-- LARGE COLLATERALS  . Cardiac catheterization  2003  &  2006    2006 ---  MITRAL REGURG/ 100% OCCULSION RCA WITH LARGE COLLATERALS/ MILD DISEASE LAD & CIRCUMFLEX  . Total hip arthroplasty  11/02/2011    Procedure: TOTAL HIP ARTHROPLASTY;  Surgeon: Valeria Batman, MD;  Location: [facility] OR;  Service: Orthopedics;  Laterality: Left;  Left Total Hip Replacement    FAMILY HISTORY: family history includes Cancer in his sister; Heart Problems in his brother and father.  SOCIAL HISTORY:  reports that he quit smoking about 40 years ago. His smoking use included Cigarettes and Pipe. He has a 25 pack-year smoking history. He has never used smokeless tobacco. He reports that he does not drink alcohol or use illicit drugs.  ALLERGIES: Bicalutamide; Lovastatin; Pravastatin;  and Codeine  MEDICATIONS:  Current Outpatient Prescriptions  Medication Sig Dispense Refill  . acetaminophen (TYLENOL) 500 MG tablet Take 500 mg by mouth every 6 (six) hours as needed for pain.      Marland Kitchen b complex vitamins tablet Take 1 tablet by mouth daily.      . Cholecalciferol  (VITAMIN D3) 1000 UNITS CAPS Take 1,000 Units by mouth daily.       . digoxin (LANOXIN) 0.125 MG tablet Take 0.125 mg by mouth daily.      Marland Kitchen diltiazem (DILACOR XR) 240 MG 24 hr capsule Take 240 mg by mouth every morning.      . enoxaparin (LOVENOX) 80 MG/0.8ML injection Inject 0.8 mLs (80 mg total) into the skin every 12 (twelve) hours.  16 Syringe  0  . furosemide (LASIX) 40 MG tablet Take 40 mg by mouth daily.      . Glucosamine-Chondroit-Vit C-Mn (GLUCOSAMINE 1500 COMPLEX PO) Take by mouth daily.      . isosorbide mononitrate (IMDUR) 60 MG 24 hr tablet Take 60 mg by mouth every morning.      Marland Kitchen lisinopril (PRINIVIL,ZESTRIL) 10 MG tablet Take 10 mg by mouth daily.      . metoprolol succinate (TOPROL-XL) 50 MG 24 hr tablet Take 50 mg by mouth every morning. Take with or immediately following a meal.      . niacin 500 MG tablet Take 500 mg by mouth daily with breakfast.      . omeprazole (PRILOSEC) 20 MG capsule Take 20 mg by mouth daily.      Marland Kitchen oxybutynin (DITROPAN) 5 MG tablet Take 5 mg by mouth 2 (two) times daily.       . Sennosides 8.6 MG CAPS Take 1 capsule by mouth daily.      . Tamsulosin HCl (FLOMAX) 0.4 MG CAPS Take 0.4 mg by mouth daily after supper.      . vitamin C (ASCORBIC ACID) 500 MG tablet Take 500 mg by mouth daily.      Marland Kitchen warfarin (COUMADIN) 2.5 MG tablet Take 1 tablet (2.5 mg total) by mouth one time only at 6 PM. Pharmacist to monitor INR and prescribe Coumadin as appropriate.  30 tablet  0  . bicalutamide (CASODEX) 50 MG tablet Take 50 mg by mouth 3 (three) times daily.      . fish oil-omega-3 fatty acids 1000 MG capsule Take 2 g by mouth daily.      . pantoprazole (PROTONIX) 40 MG tablet Take 40 mg by mouth daily.       No current facility-administered medications for this encounter.    REVIEW OF SYSTEMS:  A 15 point review of systems is documented in the electronic medical record. This was obtained by the nursing staff. However, I reviewed this with the patient to  discuss relevant findings and make appropriate changes.  Pertinent items are noted in HPI.   PHYSICAL EXAM:  height is 5\' 9"  (1.753 m) and weight is 180 lb 4.8 oz (81.784 kg). His temperature is 98.1 F (36.7 C). His blood pressure is 124/54 and his pulse is 54. His oxygen saturation is 98%.   NAD, A&Ox3.  breast exam deferred to tomorrow planning.  KPS = 90  LABORATORY DATA:  Lab Results  Component Value Date   WBC 11.0* 11/04/2011   HGB 9.5* 11/04/2011   HCT 27.5* 11/04/2011   MCV 94.2 11/04/2011   PLT 199 11/04/2011   Lab Results  Component Value Date   NA 137  11/04/2011   K 3.3* 11/04/2011   CL 103 11/04/2011   CO2 25 11/04/2011   Lab Results  Component Value Date   ALT 16 10/29/2011   AST 17 10/29/2011   ALKPHOS 81 10/29/2011   BILITOT 0.6 10/29/2011     RADIOGRAPHY:  Clinical Data: Diffuse bilateral breast tenderness, enlargement and pain. The patient had a benign excisional biopsy of the left breast in 2010.   DIGITAL DIAGNOSTIC BILATERAL MAMMOGRAM WITH CAD Comparison: Left diagnostic mammogram dated 05/23/2008. Findings: ACR Breast Density Category b: There are scattered areas of  fibroglandular density. Scattered fibroglandular tissue are seen in both breast. Excisional biopsy changes are seen in the left breast. There are no suspicious masses or malignant-type microcalcifications in either breast. Mammographic images were processed with CAD.  IMPRESSION:  No evidence of malignancy in either breast.  RECOMMENDATION:  No follow-up is needed if the clinical exam remains benign/stable. I have discussed the findings and recommendations with the patient. Results were also provided in writing at the conclusion of the  visit. If applicable, a reminder letter will be sent to the patient regarding HIS next appointment.     IMPRESSION: This gentleman is suffering from painful enlargement of breast tissue with Casodex.  He would like benefit from Casodex therapy for his  prostate cancer.  Under these circumstances low dose breast irradiation is medically necessary.  PLAN:  Today, I talked to the patient and family about the findings and work-up thus far.  We discussed the natural history of hormone therapy related gynecomastia and general treatment, highlighting the role or radiotherapy in the management.  We discussed the available radiation techniques, and focused on the details of logistics and delivery.  We reviewed the anticipated acute and late sequelae associated with radiation in this setting.  I would anticipate transient epliation and erythema in the treatment field.  The patient was encouraged to ask questions that I answered to the best of my ability. The patient would like to proceed with radiation and will be scheduled for CT simulation.  I spent 25 minutes minutes face to face with the patient and more than 50% of that time was spent in counseling and/or coordination of care.   ------------------------------------------------  Artist Pais. Kathrynn Running, M.D.

## 2012-10-25 ENCOUNTER — Ambulatory Visit
Admission: RE | Admit: 2012-10-25 | Discharge: 2012-10-25 | Disposition: A | Discharge status: Home / Self Care | Payer: Medicare Other | Source: Ambulatory Visit | Attending: Radiation Oncology | Admitting: Radiation Oncology

## 2012-10-26 ENCOUNTER — Ambulatory Visit: Payer: Medicare Other | Admitting: Pharmacist Clinician (PhC)/ Clinical Pharmacy Specialist

## 2012-10-27 ENCOUNTER — Ambulatory Visit
Admission: RE | Admit: 2012-10-27 | Discharge: 2012-10-27 | Disposition: A | Discharge status: Home / Self Care | Payer: Medicare Other | Source: Ambulatory Visit | Attending: Radiation Oncology | Admitting: Radiation Oncology

## 2012-10-30 ENCOUNTER — Ambulatory Visit
Admission: RE | Admit: 2012-10-30 | Discharge: 2012-10-30 | Disposition: A | Discharge status: Home / Self Care | Payer: Medicare Other | Source: Ambulatory Visit | Attending: Radiation Oncology | Admitting: Radiation Oncology

## 2012-10-30 ENCOUNTER — Encounter: Payer: Self-pay | Admitting: Radiation Oncology

## 2012-10-30 VITALS — BP 116/55 | HR 54 | Temp 98.0°F | Resp 16 | Wt 182.7 lb

## 2012-10-30 DIAGNOSIS — N62 Hypertrophy of breast: Secondary | ICD-10-CM

## 2012-10-30 NOTE — Progress Notes (Signed)
  Radiation Oncology         [phone]) [phone] ________________________________  Name: Greg Booker MRN: [id number]  Date: 10/30/2012  DOB: 10-30-1929  Weekly Radiation Therapy Management  Current Dose: 12 Gy     Planned Dose:  12 Gy  Narrative . . . . . . . . The patient presents for routine under treatment assessment.                                                     The patient is without complaint. He still has some breast tenderness.                                 Set-up films were reviewed.                                 The chart was checked. Physical Findings. . .  weight is 182 lb 11.2 oz (82.872 kg). His oral temperature is 98 F (36.7 C). His blood pressure is 116/55 and his pulse is 54. His respiration is 16 and oxygen saturation is 100%. . Weight essentially stable.  No significant changes. Impression . . . . . . . The patient is tolerating radiation. Plan . . . . . . . . . . . . Follow-up in 1 month  ________________________________  Artist Pais. Kathrynn Running, M.D.

## 2012-10-30 NOTE — Progress Notes (Signed)
Reports mild breast tenderness continues. No skin changes noted to radiated chest. Denies fatigue or weakness. Without complaints. One month follow up appointment card given.

## 2012-10-31 ENCOUNTER — Ambulatory Visit (INDEPENDENT_AMBULATORY_CARE_PROVIDER_SITE_OTHER): Payer: Medicare Other | Admitting: Pharmacist Clinician (PhC)/ Clinical Pharmacy Specialist

## 2012-10-31 VITALS — BP 140/70

## 2012-10-31 DIAGNOSIS — Z7901 Long term (current) use of anticoagulants: Secondary | ICD-10-CM

## 2012-10-31 DIAGNOSIS — I482 Chronic atrial fibrillation, unspecified: Secondary | ICD-10-CM

## 2012-10-31 DIAGNOSIS — I4891 Unspecified atrial fibrillation: Secondary | ICD-10-CM

## 2012-11-05 NOTE — Progress Notes (Signed)
  Radiation Oncology         (336) [phone] ________________________________  Name: RODELL MARRS MRN: [id number]  Date: 10/30/2012  DOB: [birth year]  End of Treatment Note  Diagnosis:   79 yo gentleman with castration sensitive recurrent prostate cancer and painful gynecomastia  Indication for treatment:  Palliation of breast pain and enlargement       Radiation treatment dates:  10/25/2012-10/30/2012  Site/dose:    1.  The left breast was treated to 12 Gy in 3 fractions of 4 Gy 2.  The right breast was treated to 12 Gy in 3 fractions of 4 Gy  Beams/energy:    1.  The left breast was treated using 9 MeV electrons to a depth of 90% dose with a 10 cm round collimator 2.  The right breast was treated using 9 MeV electrons to a depth of 90% dose with a 10 cm round collimator  Narrative: The patient tolerated radiation treatment relatively well.   No acute effects occurred.  Plan: The patient has completed radiation treatment. The patient will return to radiation oncology clinic for routine followup in one month. I advised them to call or return sooner if they have any questions or concerns related to their recovery or treatment. ________________________________  Artist Pais. Kathrynn Running, M.D.

## 2012-12-05 ENCOUNTER — Ambulatory Visit (INDEPENDENT_AMBULATORY_CARE_PROVIDER_SITE_OTHER): Payer: Medicare Other | Admitting: Pharmacist Clinician (PhC)/ Clinical Pharmacy Specialist

## 2012-12-05 VITALS — BP 138/66 | HR 72

## 2012-12-05 DIAGNOSIS — Z7901 Long term (current) use of anticoagulants: Secondary | ICD-10-CM

## 2012-12-05 DIAGNOSIS — I482 Chronic atrial fibrillation, unspecified: Secondary | ICD-10-CM

## 2012-12-05 DIAGNOSIS — I4891 Unspecified atrial fibrillation: Secondary | ICD-10-CM

## 2012-12-21 ENCOUNTER — Encounter: Payer: Self-pay | Admitting: Radiation Oncology

## 2012-12-21 ENCOUNTER — Ambulatory Visit
Admission: RE | Admit: 2012-12-21 | Discharge: 2012-12-21 | Disposition: A | Discharge status: Home / Self Care | Payer: Medicare Other | Source: Ambulatory Visit | Attending: Radiation Oncology | Admitting: Radiation Oncology

## 2012-12-21 VITALS — BP 138/59 | HR 61 | Temp 98.1°F | Resp 18 | Wt 180.8 lb

## 2012-12-21 DIAGNOSIS — N62 Hypertrophy of breast: Secondary | ICD-10-CM

## 2012-12-21 NOTE — Progress Notes (Signed)
Reports bilateral breast remain sore but, denies pain. Denies nipple discharge. Reports faint hyperpigmentation despite applying lotion daily. Reports energy is slowly improving.

## 2012-12-21 NOTE — Progress Notes (Signed)
Radiation Oncology         [phone] ________________________________  Name: Greg Booker MRN: [id number]  Date: 12/21/2012  DOB: 1929/06/11  Follow-Up Visit Note  CC: Alva Garnet., MD  Kathi Ludwig, *  Diagnosis:   79 yo gentleman with castration sensitive recurrent prostate cancer and painful gynecomastia s/p Palliation of breast pain and enlargement 10/25/2012-10/30/2012 with bilateral breast irradiation to 12 Gy in 3 fractions of 4 Gy  Interval Since Last Radiation:  4  weeks  Narrative:  The patient returns today for routine follow-up.   Reports bilateral breast remain sore but, denies pain. Denies nipple discharge. Reports faint hyperpigmentation despite applying lotion daily. Reports energy is slowly improving                              ALLERGIES:  is allergic to bicalutamide; lovastatin; pravastatin; and codeine.  Meds: Current Outpatient Prescriptions  Medication Sig Dispense Refill  . acetaminophen (TYLENOL) 500 MG tablet Take 500 mg by mouth every 6 (six) hours as needed for pain.      Marland Kitchen b complex vitamins tablet Take 1 tablet by mouth daily.      . bicalutamide (CASODEX) 50 MG tablet Take 50 mg by mouth 3 (three) times daily.      . Cholecalciferol (VITAMIN D3) 1000 UNITS CAPS Take 1,000 Units by mouth daily.       . digoxin (LANOXIN) 0.125 MG tablet Take 0.125 mg by mouth daily.      Marland Kitchen diltiazem (CARDIZEM CD) 240 MG 24 hr capsule       . diltiazem (DILACOR XR) 240 MG 24 hr capsule Take 240 mg by mouth every morning.      . enoxaparin (LOVENOX) 80 MG/0.8ML injection Inject 0.8 mLs (80 mg total) into the skin every 12 (twelve) hours.  16 Syringe  0  . fish oil-omega-3 fatty acids 1000 MG capsule Take 2 g by mouth daily.      . furosemide (LASIX) 40 MG tablet Take 40 mg by mouth daily.      Marland Kitchen GAVILYTE-G 236 G solution       . Glucosamine-Chondroit-Vit C-Mn (GLUCOSAMINE 1500 COMPLEX PO) Take by mouth daily.      . isosorbide mononitrate (IMDUR) 60  MG 24 hr tablet Take 60 mg by mouth every morning.      Marland Kitchen lisinopril (PRINIVIL,ZESTRIL) 10 MG tablet Take 10 mg by mouth daily.      . metoprolol succinate (TOPROL-XL) 50 MG 24 hr tablet Take 50 mg by mouth every morning. Take with or immediately following a meal.      . niacin 500 MG tablet Take 500 mg by mouth daily with breakfast.      . omeprazole (PRILOSEC) 20 MG capsule Take 20 mg by mouth daily.      Marland Kitchen oxybutynin (DITROPAN) 5 MG tablet Take 5 mg by mouth 2 (two) times daily.       . pantoprazole (PROTONIX) 40 MG tablet Take 40 mg by mouth daily.      . Sennosides 8.6 MG CAPS Take 1 capsule by mouth daily.      . Tamsulosin HCl (FLOMAX) 0.4 MG CAPS Take 0.4 mg by mouth daily after supper.      . vitamin C (ASCORBIC ACID) 500 MG tablet Take 500 mg by mouth daily.      Marland Kitchen warfarin (COUMADIN) 2.5 MG tablet Take 1 tablet (2.5 mg total) by mouth  one time only at 6 PM. Pharmacist to monitor INR and prescribe Coumadin as appropriate.  30 tablet  0   No current facility-administered medications for this encounter.    Physical Findings: The patient is in no acute distress. Patient is alert and oriented.  weight is 180 lb 12.8 oz (82.01 kg). His oral temperature is 98.1 F (36.7 C). His blood pressure is 138/59 and his pulse is 61. His respiration is 18. . Mild hyperpigmentation in fields. No significant changes.  Impression:  The patient is recovering from the effects of radiation.  Plan:  I am hopeful his breast discomfort and enlargement will continue to improve allowing hormonal therapy.  _____________________________________  Artist Pais Kathrynn Running, M.D.

## 2013-01-02 ENCOUNTER — Ambulatory Visit (INDEPENDENT_AMBULATORY_CARE_PROVIDER_SITE_OTHER): Payer: Medicare Other | Admitting: Pharmacist Clinician (PhC)/ Clinical Pharmacy Specialist

## 2013-01-02 VITALS — BP 120/62 | HR 64

## 2013-01-02 DIAGNOSIS — I482 Chronic atrial fibrillation, unspecified: Secondary | ICD-10-CM

## 2013-01-02 DIAGNOSIS — I4891 Unspecified atrial fibrillation: Secondary | ICD-10-CM

## 2013-01-02 DIAGNOSIS — Z7901 Long term (current) use of anticoagulants: Secondary | ICD-10-CM

## 2013-01-02 LAB — POCT INR: INR: 2.2

## 2013-01-10 ENCOUNTER — Encounter: Payer: Self-pay | Admitting: *Deleted

## 2013-02-02 ENCOUNTER — Ambulatory Visit (INDEPENDENT_AMBULATORY_CARE_PROVIDER_SITE_OTHER): Payer: Medicare Other | Admitting: Internal Medicine

## 2013-02-02 ENCOUNTER — Ambulatory Visit (INDEPENDENT_AMBULATORY_CARE_PROVIDER_SITE_OTHER): Payer: Medicare Other | Admitting: Pharmacist Clinician (PhC)/ Clinical Pharmacy Specialist

## 2013-02-02 ENCOUNTER — Encounter: Payer: Self-pay | Admitting: Internal Medicine

## 2013-02-02 VITALS — BP 136/60 | HR 61 | Ht 68.0 in | Wt 181.1 lb

## 2013-02-02 DIAGNOSIS — I82409 Acute embolism and thrombosis of unspecified deep veins of unspecified lower extremity: Secondary | ICD-10-CM

## 2013-02-02 DIAGNOSIS — I482 Chronic atrial fibrillation, unspecified: Secondary | ICD-10-CM

## 2013-02-02 DIAGNOSIS — I2510 Atherosclerotic heart disease of native coronary artery without angina pectoris: Secondary | ICD-10-CM

## 2013-02-02 DIAGNOSIS — Z7901 Long term (current) use of anticoagulants: Secondary | ICD-10-CM

## 2013-02-02 DIAGNOSIS — I4891 Unspecified atrial fibrillation: Secondary | ICD-10-CM

## 2013-02-02 DIAGNOSIS — I10 Essential (primary) hypertension: Secondary | ICD-10-CM

## 2013-02-02 LAB — POCT INR: INR: 2.7

## 2013-02-02 NOTE — Patient Instructions (Signed)
Your physician wants you to follow-up in:  6 months. You will receive a reminder letter in the mail two months in advance. If you don't receive a letter, please call our office to schedule the follow-up appointment.   

## 2013-02-04 ENCOUNTER — Encounter: Payer: Self-pay | Admitting: Internal Medicine

## 2013-02-04 NOTE — Progress Notes (Signed)
OFFICE NOTE  Chief Complaint:  Routine follow-up  Primary Care Physician: Salena Saner., MD  HPI:  Greg Booker is an 80 year old gentleman followed by Dr. Rex Booker with history of coronary disease in the past. Cardiac catheterization in 2006 showed an occluded right coronary with collaterals, EF 50%. A stress test in 2010 most recently was negative for ischemia. He has had trouble with DVT and has been off Coumadin for periods of time. Recently he had hip surgery, which he underwent uneventfully and has done well and is back on his Coumadin. Unfortunately, his levels were too high; as of January 3 his INR was 6.9.  It now seems to be more regulated. He is describing no symptoms of bleeding, shortness of breath, palpitations, presyncope, syncopal symptoms, chest pain, or any associated cardiac findings.   Greg Booker returns today and is feeling quite well. Unfortunately a few weeks ago he developed a pneumonia and was treated for this. He is now almost completely recovered. Fortunately, his warfarin levels have remained therapeutic. His gotten back to his activities and is looking forward to going on a cruise for the next week.  PMHx:  Past Medical History  Diagnosis Date  . GERD (gastroesophageal reflux disease)   . Coronary artery disease CARDIOLOGIST - DR LITTLE-- LAST VISIT 2 MON AGO    DENIES S & S  . Hyperlipidemia   . S/P primary angioplasty with coronary stent   . Calcified granuloma of lung RIGHT LUNG BASE - STABLE PER CXR 03-03-2010  . Mitral regurgitation   . Chronic back pain   . Sciatic nerve pain LEFT LEG PAIN--  EPI INJECTIONS  . Low back pain radiating to left leg   . Numbness and tingling of left leg   . Impaired hearing BILATERAL AIDS  . Nocturia   . Prostate cancer RECUR    S/P RADIATIVE SEEDS 2006  . Anticoagulated on Coumadin   . PAF (paroxysmal atrial fibrillation)   . Heart murmur   . Peripheral vascular disease     dvt lft leg 10 yrs ago  .  Arthritis   . S/P radiation therapy     Past Surgical History  Procedure Laterality Date  . Laminectomy  10-28-2008    L3 - 4  . Excision benign breast mass  08-06-2008    LEFT  . Cardioversion  09-15-2005    UNSUCCESSFUL  . Radioactive seed implant  08-03-2004    PROSTATE  . Cardiovascular stress test  06-03-2008    NORMAL STUDY/ LV NORMAL / COMPARED TO 12-26-2000 INFERIOR WALL ISCHEMIA IS NO LONGER PRESENT.  . Transthoracic echocardiogram  07-26-2007    LVSF NORMAL/ EF 55%/ LEFT ATRIUM 4.5CM MILD TO MOD. DILATED/ MILD  MITRAL & TRICUSPID REGURG  . Cryoablation  03/01/2011    Procedure: CRYO ABLATION PROSTATE;  Surgeon: Ailene Rud, MD;  Location: [facility];  Service: Urology;  Laterality: N/A;  . Coronary angioplasty with stent placement  1997- DR AL LITTLE    X1 STENT RCA  . Cardiac catheterization  1998    IN-STENT RESTENOSIS-- LARGE COLLATERALS  . Cardiac catheterization  2003  &  2006    2006 ---  MITRAL REGURG/ 100% OCCULSION RCA WITH LARGE COLLATERALS/ MILD DISEASE LAD & CIRCUMFLEX  . Total hip arthroplasty  11/02/2011    Procedure: TOTAL HIP ARTHROPLASTY;  Surgeon: Garald Balding, MD;  Location: [facility];  Service: Orthopedics;  Laterality: Left;  Left Total Hip Replacement    FAMHx:  Family History  Problem Relation Age of Onset  . Cancer Sister   . Heart Problems Father   . Heart Problems Brother     SOCHx:   reports that he quit smoking about 40 years ago. His smoking use included Cigarettes and Pipe. He has a 25 pack-year smoking history. He has never used smokeless tobacco. He reports that he does not drink alcohol or use illicit drugs.  ALLERGIES:  Allergies  Allergen Reactions  . Bicalutamide   . Lovastatin Other (See Comments)    Leg weakness  . Pravastatin Other (See Comments)    myalgias  . Codeine Rash    ROS: A comprehensive review of systems was negative except for: Respiratory: positive for dyspnea on  exertion Musculoskeletal: positive for stiff joints  HOME MEDS: Current Outpatient Prescriptions  Medication Sig Dispense Refill  . acetaminophen (TYLENOL) 500 MG tablet Take 500 mg by mouth every 6 (six) hours as needed for pain.      Marland Kitchen b complex vitamins tablet Take 1 tablet by mouth daily.      . benzonatate (TESSALON) 100 MG capsule Take 100 mg by mouth 4 (four) times daily as needed.       . bicalutamide (CASODEX) 50 MG tablet Take 50 mg by mouth 3 (three) times daily.      . Cholecalciferol (VITAMIN D3) 1000 UNITS CAPS Take 1,000 Units by mouth daily.       . digoxin (LANOXIN) 0.125 MG tablet Take 0.125 mg by mouth daily.      Marland Kitchen diltiazem (CARDIZEM CD) 240 MG 24 hr capsule Take 240 mg by mouth daily.       Marland Kitchen enoxaparin (LOVENOX) 80 MG/0.8ML injection Inject 80 mg into the skin every 12 (twelve) hours. When having procedure      . fish oil-omega-3 fatty acids 1000 MG capsule Take 2 g by mouth daily.      . furosemide (LASIX) 40 MG tablet Take 40 mg by mouth daily.      . Glucosamine-Chondroit-Vit C-Mn (GLUCOSAMINE 1500 COMPLEX PO) Take by mouth daily.      . isosorbide mononitrate (IMDUR) 60 MG 24 hr tablet Take 60 mg by mouth every morning.      Marland Kitchen lisinopril (PRINIVIL,ZESTRIL) 10 MG tablet Take 10 mg by mouth daily.      . metoprolol succinate (TOPROL-XL) 50 MG 24 hr tablet Take 50 mg by mouth every morning. Take with or immediately following a meal.      . niacin 500 MG tablet Take 500 mg by mouth daily with breakfast.      . omeprazole (PRILOSEC) 20 MG capsule Take 20 mg by mouth daily.      Marland Kitchen oxybutynin (DITROPAN) 5 MG tablet Take 5 mg by mouth daily.       . pantoprazole (PROTONIX) 40 MG tablet Take 40 mg by mouth daily.      . Sennosides 8.6 MG CAPS Take 1 capsule by mouth daily.      . Tamsulosin HCl (FLOMAX) 0.4 MG CAPS Take 0.4 mg by mouth daily after supper.      . vitamin C (ASCORBIC ACID) 500 MG tablet Take 500 mg by mouth daily.      Marland Kitchen warfarin (COUMADIN) 2.5 MG tablet  Take 1 tablet (2.5 mg total) by mouth one time only at 6 PM. Pharmacist to monitor INR and prescribe Coumadin as appropriate.  30 tablet  0   No current facility-administered medications for this visit.    LABS/IMAGING:  No results found for this or any previous visit (from the past 48 hour(s)). No results found.  VITALS: BP 136/60  Pulse 61  Ht 5\' 8"  (1.727 m)  Wt 181 lb 1.6 oz (82.146 kg)  BMI 27.54 kg/m2  EXAM: General appearance: alert and no distress Neck: no adenopathy, no carotid bruit, no JVD, supple, symmetrical, trachea midline and thyroid not enlarged, symmetric, no tenderness/mass/nodules Lungs: clear to auscultation bilaterally Heart: irregularly irregular rhythm Abdomen: soft, non-tender; bowel sounds normal; no masses,  no organomegaly Extremities: extremities normal, atraumatic, no cyanosis or edema Pulses: 2+ and symmetric Skin: Skin color, texture, turgor normal. No rashes or lesions Neurologic: Grossly normal  EKG: Atrial fibrillation with controlled ventricular response of 61  ASSESSMENT: 1. Chronic atrial fibrillation 2. Coronary artery disease with occluded right coronary left to right collaterals, EF 50% 3. Recent pneumonia 4. Hypertension 5. Dyslipidemia 6. Overweight  PLAN: 1.   Mr. Harnage is doing fairly well. He unfortunately had the pneumonia since the last visit but is improving from that. His blood pressure is well-controlled today and his A. fib his rate controlled. His INR is therapeutic. We'll plan to see him back in 6 months or sooner as necessary. No changes to his medication today.  Pixie Casino, MD, [facility] Attending Cardiologist [facility] 02/04/2013, 2:23 PM

## 2013-02-20 ENCOUNTER — Telehealth: Payer: Self-pay | Admitting: Internal Medicine

## 2013-02-20 ENCOUNTER — Other Ambulatory Visit: Payer: Self-pay | Admitting: Pharmacist Clinician (PhC)/ Clinical Pharmacy Specialist

## 2013-02-20 ENCOUNTER — Telehealth: Payer: Self-pay | Admitting: *Deleted

## 2013-02-20 MED ORDER — WARFARIN SODIUM 2.5 MG PO TABS: ORAL_TABLET | ORAL | Status: DC

## 2013-02-20 NOTE — Telephone Encounter (Signed)
Latisha from [facility] in [place] was calling in regards to pt's Warfarin filled for a 30 day supply because he was visiting the state.  [place]

## 2013-02-20 NOTE — Telephone Encounter (Signed)
See phone encounter.

## 2013-02-20 NOTE — Telephone Encounter (Signed)
Pt in [place] for several days, left meds at home.  Wanted to know if ok to miss coumadin that many days.  Called pt back, reminded him that he stopped once before and developed a blood clot.  Sent 30 day supply to [facility] in [place].  Pt stated he would pick up.

## 2013-02-20 NOTE — Telephone Encounter (Signed)
Down in [place] , Left all medication at home and wants to know if there is any problem with him taking his coumadin on Wednesday .Marland Kitchen Will there be any issue with that..Please call at [phone]

## 2013-02-20 NOTE — Telephone Encounter (Signed)
Spoke with pharmacy, rx sent again.

## 2013-03-02 ENCOUNTER — Ambulatory Visit (INDEPENDENT_AMBULATORY_CARE_PROVIDER_SITE_OTHER): Payer: Medicare Other | Admitting: Pharmacist Clinician (PhC)/ Clinical Pharmacy Specialist

## 2013-03-02 VITALS — BP 160/66 | HR 72

## 2013-03-02 DIAGNOSIS — Z7901 Long term (current) use of anticoagulants: Secondary | ICD-10-CM

## 2013-03-02 DIAGNOSIS — I482 Chronic atrial fibrillation, unspecified: Secondary | ICD-10-CM

## 2013-03-02 DIAGNOSIS — I4891 Unspecified atrial fibrillation: Secondary | ICD-10-CM

## 2013-03-02 LAB — POCT INR: INR: 2.1

## 2013-03-26 ENCOUNTER — Telehealth: Payer: Self-pay | Admitting: *Deleted

## 2013-03-26 NOTE — Telephone Encounter (Signed)
Faxed to [facility], Attn: Dr. Earnie Larsson - clearance to hold warfarin 5 days prior to laminectomy and restart after.

## 2013-03-28 ENCOUNTER — Other Ambulatory Visit: Payer: Self-pay | Admitting: Neurosurgery

## 2013-03-29 ENCOUNTER — Encounter (HOSPITAL_COMMUNITY): Payer: Self-pay | Admitting: Pharmacy Technician

## 2013-03-30 ENCOUNTER — Ambulatory Visit (INDEPENDENT_AMBULATORY_CARE_PROVIDER_SITE_OTHER): Payer: Medicare Other | Admitting: Pharmacist Clinician (PhC)/ Clinical Pharmacy Specialist

## 2013-03-30 ENCOUNTER — Other Ambulatory Visit: Payer: Self-pay | Admitting: Pharmacist Clinician (PhC)/ Clinical Pharmacy Specialist

## 2013-03-30 ENCOUNTER — Ambulatory Visit: Payer: Medicare Other | Admitting: Pharmacist Clinician (PhC)/ Clinical Pharmacy Specialist

## 2013-03-30 DIAGNOSIS — Z7901 Long term (current) use of anticoagulants: Secondary | ICD-10-CM

## 2013-03-30 DIAGNOSIS — I4891 Unspecified atrial fibrillation: Secondary | ICD-10-CM

## 2013-03-30 DIAGNOSIS — I482 Chronic atrial fibrillation, unspecified: Secondary | ICD-10-CM

## 2013-03-30 LAB — POCT INR: INR: 4.2

## 2013-03-30 MED ORDER — ENOXAPARIN SODIUM 80 MG/0.8ML ~~LOC~~ SOLN: 80.0000 mg | Freq: Two times a day (BID) | SUBCUTANEOUS | Status: DC

## 2013-04-02 ENCOUNTER — Ambulatory Visit: Payer: Medicare Other | Admitting: Pharmacist Clinician (PhC)/ Clinical Pharmacy Specialist

## 2013-04-02 NOTE — Pre-Procedure Instructions (Addendum)
Greg Booker  [birth year]   Your procedure is scheduled on:  04/05/13  Report to [facility] cone short stay admitting at 530 AM.  Call this number if you have problems the morning of surgery: [phone]   Remember:   Do not eat food or drink liquids after midnight.   Take these medicines the morning of surgery with A SIP OF WATER: digoxin, imdur, metoprolol,omeprazole or protonix,ditropan        STOP all herbel meds, nsaids (aleve,naproxen,advil,ibuprofen)now including vitamins, niacin     COUMADIN 5 DAYS per dr     Carron Curie per dr      Rayne Du DIABETIC MEDS AM OF SURGERY   Do not wear jewelry, make-up or nail polish.  Do not wear lotions, powders, or perfumes. You may wear deodorant.  Do not shave 48 hours prior to surgery. Men may shave face and neck.  Do not bring valuables to the hospital.  [facility] is not responsible                  for any belongings or valuables.               Contacts, dentures or bridgework may not be worn into surgery.  Leave suitcase in the car. After surgery it may be brought to your room.  For patients admitted to the hospital, discharge time is determined by your                treatment team.               Patients discharged the day of surgery will not be allowed to drive  home.  Name and phone number of your driver:   Special Instructions:  Special Instructions: [place] - Preparing for Surgery  Before surgery, you can play an important role.  Because skin is not sterile, your skin needs to be as free of germs as possible.  You can reduce the number of germs on you skin by washing with CHG (chlorahexidine gluconate) soap before surgery.  CHG is an antiseptic cleaner which kills germs and bonds with the skin to continue killing germs even after washing.  Please DO NOT use if you have an allergy to CHG or antibacterial soaps.  If your skin becomes reddened/irritated stop using the CHG and inform your nurse when you arrive at Short Stay.  Do not shave  (including legs and underarms) for at least 48 hours prior to the first CHG shower.  You may shave your face.  Please follow these instructions carefully:   1.  Shower with CHG Soap the night before surgery and the morning of Surgery.  2.  If you choose to wash your hair, wash your hair first as usual with your normal shampoo.  3.  After you shampoo, rinse your hair and body thoroughly to remove the Shampoo.  4.  Use CHG as you would any other liquid soap.  You can apply chg directly  to the skin and wash gently with scrungie or a clean washcloth.  5.  Apply the CHG Soap to your body ONLY FROM THE NECK DOWN.  Do not use on open wounds or open sores.  Avoid contact with your eyes ears, mouth and genitals (private parts).  Wash genitals (private parts)       with your normal soap.  6.  Wash thoroughly, paying special attention to the area where your surgery will be performed.  7.  Thoroughly rinse your body with  warm water from the neck down.  8.  DO NOT shower/wash with your normal soap after using and rinsing off the CHG Soap.  9.  Pat yourself dry with a clean towel.            10.  Wear clean pajamas.            11.  Place clean sheets on your bed the night of your first shower and do not sleep with pets.  Day of Surgery  Do not apply any lotions/deodorants the morning of surgery.  Please wear clean clothes to the hospital/surgery center.   Please read over the following fact sheets that you were given: Pain Booklet, Coughing and Deep Breathing, MRSA Information and Surgical Site Infection Prevention

## 2013-04-03 ENCOUNTER — Encounter (HOSPITAL_COMMUNITY)
Admission: RE | Admit: 2013-04-03 | Discharge: 2013-04-03 | Disposition: A | Discharge status: Home / Self Care | Payer: Medicare Other | Source: Ambulatory Visit | Attending: Neurosurgery | Admitting: Neurosurgery

## 2013-04-03 ENCOUNTER — Encounter (HOSPITAL_COMMUNITY): Payer: Self-pay

## 2013-04-03 ENCOUNTER — Encounter (HOSPITAL_COMMUNITY)
Admission: RE | Admit: 2013-04-03 | Discharge: 2013-04-03 | Disposition: A | Discharge status: Home / Self Care | Payer: Medicare Other | Source: Ambulatory Visit | Attending: Anesthesiology | Admitting: Anesthesiology

## 2013-04-03 HISTORY — DX: Pneumonia, unspecified organism: J18.9

## 2013-04-03 LAB — BASIC METABOLIC PANEL
BUN: 21 mg/dL (ref 6–23)
CALCIUM: 9.2 mg/dL (ref 8.4–10.5)
CO2: 25 mEq/L (ref 19–32)
CREATININE: 1.24 mg/dL (ref 0.50–1.35)
Chloride: 106 mEq/L (ref 96–112)
GFR, EST AFRICAN AMERICAN: 61 mL/min — AB (ref >90)
GFR, EST NON AFRICAN AMERICAN: 53 mL/min — AB (ref >90)
Glucose, Bld: 102 mg/dL — ABNORMAL HIGH (ref 70–99)
Potassium: 3.5 mEq/L — ABNORMAL LOW (ref 3.7–5.3)
SODIUM: 146 meq/L (ref 137–147)

## 2013-04-03 LAB — CBC WITH DIFFERENTIAL/PLATELET
Basophils Absolute: 0 10*3/uL (ref 0.0–0.1)
Basophils Relative: 0 % (ref 0–1)
Eosinophils Absolute: 0.1 10*3/uL (ref 0.0–0.7)
Eosinophils Relative: 1 % (ref 0–5)
HCT: 33.2 % — ABNORMAL LOW (ref 39.0–52.0)
Hemoglobin: 11.4 g/dL — ABNORMAL LOW (ref 13.0–17.0)
LYMPHS ABS: 2.1 10*3/uL (ref 0.7–4.0)
LYMPHS PCT: 22 % (ref 12–46)
MCH: 33.3 pg (ref 26.0–34.0)
MCHC: 34.3 g/dL (ref 30.0–36.0)
MCV: 97.1 fL (ref 78.0–100.0)
Monocytes Absolute: 0.7 10*3/uL (ref 0.1–1.0)
Monocytes Relative: 7 % (ref 3–12)
NEUTROS PCT: 70 % (ref 43–77)
Neutro Abs: 6.6 10*3/uL (ref 1.7–7.7)
Platelets: 274 10*3/uL (ref 150–400)
RBC: 3.42 MIL/uL — AB (ref 4.22–5.81)
RDW: 14 % (ref 11.5–15.5)
WBC: 9.5 10*3/uL (ref 4.0–10.5)

## 2013-04-03 LAB — PROTIME-INR
INR: 1.47 (ref 0.00–1.49)
Prothrombin Time: 17.4 seconds — ABNORMAL HIGH (ref 11.6–15.2)

## 2013-04-03 LAB — SURGICAL PCR SCREEN
MRSA, PCR: NEGATIVE
STAPHYLOCOCCUS AUREUS: POSITIVE — AB

## 2013-04-03 LAB — APTT: aPTT: 51 seconds — ABNORMAL HIGH (ref 24–37)

## 2013-04-03 NOTE — Progress Notes (Signed)
Called in Prescription for Mupriocin ointment to [facility] at [phone], Patient made aware and verbalized understanding of instructions.

## 2013-04-04 ENCOUNTER — Encounter (HOSPITAL_COMMUNITY): Payer: Self-pay

## 2013-04-04 MED ORDER — CEFAZOLIN SODIUM-DEXTROSE 2-3 GM-% IV SOLR
2.0000 g | INTRAVENOUS | Status: AC
  Administered 2013-04-05: 2 g via INTRAVENOUS
  Filled 2013-04-04: qty 50

## 2013-04-04 MED ORDER — DEXAMETHASONE SODIUM PHOSPHATE 10 MG/ML IJ SOLN
10.0000 mg | INTRAMUSCULAR | Status: AC
  Administered 2013-04-05: 10 mg via INTRAVENOUS
  Filled 2013-04-04: qty 1

## 2013-04-04 NOTE — Progress Notes (Addendum)
Anesthesia chart review: Patient is an 80 year old male scheduled for right L3-4 laminectomy on 04/05/2013 by Dr. Annette Stable.   History includes former smoker CAD s/p RCA stent '97, chronic afib s/p unsuccessful cardioversion 09/2005, GERD, prior PNA, hyperlipidemia, impaired hearing, prostate cancer s/p radioactive seed implant '06 and cryotherapy '13, diabetes mellitus type 2, LLE DVT 10 years ago, left THA '13. PCP is listed as Dr. Willey Blade. Cardiologist is Dr. Debara Pickett who cleared patient for surgery with permission to hold warfarin for 5 days preoperatively. He is on a Lovenox bridge.  EKG on 02/02/13 showed afib, septal infarct (age undetermined). HR was 74 bpm at PAT.  Echo on 07/26/2007 showed mild concentric left ventricular hypertrophy, EF 55%, left ventricular systolic function is normal. LA is mildly to moderately dilated. RA is mildly dilated. Mild MR/TR. Trace AR.  Nuclear stress test on 06/03/08 showed: Normal myocardial perfusion study. No significant wall motion abnormalities.  Cardiac cath on 10/09/04 showed:  VENTRICULOGRAPHY: Ventriculography in the RAO projection revealed an ejection fraction of approximately 50%. There was posterior basilar segment hypokinesis; +2 mitral regurgitation was seen. The left ventricular end-diastolic pressure was 13.  CORONARY ARTERIOGRAPHY: On fluoroscopy, dense calcification was seen in the proximal portion of the LAD and in the left main.  1. Left main normal; it bifurcated.  2. LAD: The LAD extended down and crossed the apex of the heart. It gave rise to a large left-to-right collateral bed. The proximal LAD had minor irregularities of less than 40%. The ostium of the first diagonal had minor narrowing. The remainder showed no significant disease.  3. Circumflex: The circumflex gave rise to 1 large OM vessel and the OM vessel itself was free of disease. Proximal to the OM vessel was a focal area of narrowing that was less than 50%.  4. Right coronary  artery: On fluoroscopy, a stent was noted in what would be the midportion of the RCA. The vessel, however, was occluded 100% proximally. There were left-to-right collaterals filling the entire posterior descending artery.   CXR on 04/03/13 showed: 1. Pleural parenchymal thickening particularly in the left lung base. These findings are most consistent scarring. Mild pneumonia left lower lobe cannot be excluded. Similar findings noted on prior study from 10/29/11.  2. Stable calcified pulmonary nodules right lung base consistent with old granulomas.  Preoperative labs notes.  Repeat PT/PTT on arrival.  Patient has been cleared by cardiology.  CXR findings were similar to study from 10/2011.  Further evaluation by his assigned anesthesiologist on the day of surgery. If no acute cardiopulmonary changes and followup labs are acceptable then I anticipate he can proceed as planned.  George Hugh [facility] Short Stay Center/Anesthesiology Phone [phone] 04/04/2013 9:34 AM

## 2013-04-05 ENCOUNTER — Inpatient Hospital Stay (HOSPITAL_COMMUNITY): Payer: Medicare Other | Admitting: Anesthesiology

## 2013-04-05 ENCOUNTER — Ambulatory Visit (HOSPITAL_COMMUNITY)
Admission: RE | Admit: 2013-04-05 | Discharge: 2013-04-05 | Disposition: A | Discharge status: Home / Self Care | Payer: Medicare Other | Source: Ambulatory Visit | Attending: Neurosurgery | Admitting: Neurosurgery

## 2013-04-05 ENCOUNTER — Encounter (HOSPITAL_COMMUNITY)
Admission: RE | Disposition: A | Discharge status: Home / Self Care | Payer: Self-pay | Source: Ambulatory Visit | Attending: Neurosurgery

## 2013-04-05 ENCOUNTER — Encounter (HOSPITAL_COMMUNITY): Payer: Medicare Other | Admitting: Vascular Surgery

## 2013-04-05 ENCOUNTER — Inpatient Hospital Stay (HOSPITAL_COMMUNITY): Payer: Medicare Other

## 2013-04-05 ENCOUNTER — Encounter (HOSPITAL_COMMUNITY): Payer: Self-pay | Admitting: *Deleted

## 2013-04-05 DIAGNOSIS — R011 Cardiac murmur, unspecified: Secondary | ICD-10-CM | POA: Insufficient documentation

## 2013-04-05 DIAGNOSIS — M47816 Spondylosis without myelopathy or radiculopathy, lumbar region: Secondary | ICD-10-CM | POA: Diagnosis present

## 2013-04-05 DIAGNOSIS — Z9861 Coronary angioplasty status: Secondary | ICD-10-CM | POA: Insufficient documentation

## 2013-04-05 DIAGNOSIS — I10 Essential (primary) hypertension: Secondary | ICD-10-CM | POA: Insufficient documentation

## 2013-04-05 DIAGNOSIS — I4891 Unspecified atrial fibrillation: Secondary | ICD-10-CM | POA: Insufficient documentation

## 2013-04-05 DIAGNOSIS — E119 Type 2 diabetes mellitus without complications: Secondary | ICD-10-CM | POA: Insufficient documentation

## 2013-04-05 DIAGNOSIS — E785 Hyperlipidemia, unspecified: Secondary | ICD-10-CM | POA: Insufficient documentation

## 2013-04-05 DIAGNOSIS — Z01812 Encounter for preprocedural laboratory examination: Secondary | ICD-10-CM | POA: Insufficient documentation

## 2013-04-05 DIAGNOSIS — Z8546 Personal history of malignant neoplasm of prostate: Secondary | ICD-10-CM | POA: Insufficient documentation

## 2013-04-05 DIAGNOSIS — Z86718 Personal history of other venous thrombosis and embolism: Secondary | ICD-10-CM | POA: Insufficient documentation

## 2013-04-05 DIAGNOSIS — Z87891 Personal history of nicotine dependence: Secondary | ICD-10-CM | POA: Insufficient documentation

## 2013-04-05 DIAGNOSIS — I2510 Atherosclerotic heart disease of native coronary artery without angina pectoris: Secondary | ICD-10-CM | POA: Insufficient documentation

## 2013-04-05 DIAGNOSIS — G4730 Sleep apnea, unspecified: Secondary | ICD-10-CM | POA: Insufficient documentation

## 2013-04-05 DIAGNOSIS — Z96649 Presence of unspecified artificial hip joint: Secondary | ICD-10-CM | POA: Insufficient documentation

## 2013-04-05 DIAGNOSIS — K219 Gastro-esophageal reflux disease without esophagitis: Secondary | ICD-10-CM | POA: Insufficient documentation

## 2013-04-05 DIAGNOSIS — Z7901 Long term (current) use of anticoagulants: Secondary | ICD-10-CM | POA: Insufficient documentation

## 2013-04-05 DIAGNOSIS — M47817 Spondylosis without myelopathy or radiculopathy, lumbosacral region: Principal | ICD-10-CM

## 2013-04-05 DIAGNOSIS — I509 Heart failure, unspecified: Secondary | ICD-10-CM | POA: Insufficient documentation

## 2013-04-05 DIAGNOSIS — Z79899 Other long term (current) drug therapy: Secondary | ICD-10-CM | POA: Insufficient documentation

## 2013-04-05 DIAGNOSIS — Z01818 Encounter for other preprocedural examination: Secondary | ICD-10-CM | POA: Insufficient documentation

## 2013-04-05 HISTORY — PX: LUMBAR LAMINECTOMY/DECOMPRESSION MICRODISCECTOMY: SHX5026

## 2013-04-05 LAB — GLUCOSE, CAPILLARY
GLUCOSE-CAPILLARY: 107 mg/dL — AB (ref 70–99)
Glucose-Capillary: 105 mg/dL — ABNORMAL HIGH (ref 70–99)
Glucose-Capillary: 127 mg/dL — ABNORMAL HIGH (ref 70–99)
Glucose-Capillary: 218 mg/dL — ABNORMAL HIGH (ref 70–99)

## 2013-04-05 LAB — PROTIME-INR
INR: 1.21 (ref 0.00–1.49)
PROTHROMBIN TIME: 15 s (ref 11.6–15.2)

## 2013-04-05 LAB — APTT: APTT: 36 s (ref 24–37)

## 2013-04-05 SURGERY — LUMBAR LAMINECTOMY/DECOMPRESSION MICRODISCECTOMY 1 LEVEL
Anesthesia: General | Site: Back | Laterality: Right

## 2013-04-05 MED ORDER — SUCCINYLCHOLINE CHLORIDE 20 MG/ML IJ SOLN
INTRAMUSCULAR | Status: AC
  Filled 2013-04-05: qty 1

## 2013-04-05 MED ORDER — SODIUM CHLORIDE 0.9 % IJ SOLN: 3.0000 mL | INTRAMUSCULAR | Status: DC | PRN

## 2013-04-05 MED ORDER — FENTANYL CITRATE 0.05 MG/ML IJ SOLN
INTRAMUSCULAR | Status: AC
  Filled 2013-04-05: qty 5

## 2013-04-05 MED ORDER — OXYBUTYNIN CHLORIDE 5 MG PO TABS: 5.0000 mg | ORAL_TABLET | Freq: Every day | ORAL | Status: DC

## 2013-04-05 MED ORDER — NEOSTIGMINE METHYLSULFATE 1 MG/ML IJ SOLN
INTRAMUSCULAR | Status: AC
  Filled 2013-04-05: qty 10

## 2013-04-05 MED ORDER — PANTOPRAZOLE SODIUM 40 MG PO TBEC: 40.0000 mg | DELAYED_RELEASE_TABLET | Freq: Every day | ORAL | Status: DC

## 2013-04-05 MED ORDER — OXYCODONE-ACETAMINOPHEN 5-325 MG PO TABS: 1.0000 | ORAL_TABLET | ORAL | Status: DC | PRN

## 2013-04-05 MED ORDER — MENTHOL 3 MG MT LOZG: 1.0000 | LOZENGE | OROMUCOSAL | Status: DC | PRN

## 2013-04-05 MED ORDER — HEMOSTATIC AGENTS (NO CHARGE) OPTIME
TOPICAL | Status: DC | PRN
  Administered 2013-04-05: 1 via TOPICAL

## 2013-04-05 MED ORDER — TAMSULOSIN HCL 0.4 MG PO CAPS
0.4000 mg | ORAL_CAPSULE | Freq: Every day | ORAL | Status: DC
  Administered 2013-04-05: 0.4 mg via ORAL
  Filled 2013-04-05: qty 1

## 2013-04-05 MED ORDER — ONDANSETRON HCL 4 MG/2ML IJ SOLN
INTRAMUSCULAR | Status: DC | PRN
  Administered 2013-04-05: 4 mg via INTRAVENOUS

## 2013-04-05 MED ORDER — SODIUM CHLORIDE 0.9 % IR SOLN
Status: DC | PRN
  Administered 2013-04-05: 08:00:00

## 2013-04-05 MED ORDER — ARTIFICIAL TEARS OP OINT
TOPICAL_OINTMENT | OPHTHALMIC | Status: AC
  Filled 2013-04-05: qty 3.5

## 2013-04-05 MED ORDER — HYDROCODONE-ACETAMINOPHEN 5-325 MG PO TABS: 1.0000 | ORAL_TABLET | ORAL | Status: DC | PRN

## 2013-04-05 MED ORDER — GLYCOPYRROLATE 0.2 MG/ML IJ SOLN
INTRAMUSCULAR | Status: AC
  Filled 2013-04-05: qty 2

## 2013-04-05 MED ORDER — EPHEDRINE SULFATE 50 MG/ML IJ SOLN
INTRAMUSCULAR | Status: AC
  Filled 2013-04-05: qty 1

## 2013-04-05 MED ORDER — ISOSORBIDE MONONITRATE ER 60 MG PO TB24: 60.0000 mg | ORAL_TABLET | Freq: Every morning | ORAL | Status: DC

## 2013-04-05 MED ORDER — DIGOXIN 125 MCG PO TABS
0.1250 mg | ORAL_TABLET | Freq: Every day | ORAL | Status: DC
  Filled 2013-04-05: qty 1

## 2013-04-05 MED ORDER — ACETAMINOPHEN 650 MG RE SUPP: 650.0000 mg | RECTAL | Status: DC | PRN

## 2013-04-05 MED ORDER — NIACIN 500 MG PO TABS
500.0000 mg | ORAL_TABLET | Freq: Every day | ORAL | Status: DC
  Filled 2013-04-05: qty 1

## 2013-04-05 MED ORDER — VITAMIN C 500 MG PO TABS
500.0000 mg | ORAL_TABLET | Freq: Every day | ORAL | Status: DC
  Administered 2013-04-05: 500 mg via ORAL
  Filled 2013-04-05: qty 1

## 2013-04-05 MED ORDER — KETOROLAC TROMETHAMINE 15 MG/ML IJ SOLN
INTRAMUSCULAR | Status: DC | PRN
  Administered 2013-04-05: 15 mg via INTRAVENOUS

## 2013-04-05 MED ORDER — LISINOPRIL 10 MG PO TABS
10.0000 mg | ORAL_TABLET | Freq: Every day | ORAL | Status: DC
Start: 2013-04-05 — End: 2013-04-05
  Administered 2013-04-05: 10 mg via ORAL
  Filled 2013-04-05: qty 1

## 2013-04-05 MED ORDER — KETOROLAC TROMETHAMINE 30 MG/ML IJ SOLN
15.0000 mg | Freq: Four times a day (QID) | INTRAMUSCULAR | Status: DC
  Administered 2013-04-05: 15 mg via INTRAVENOUS

## 2013-04-05 MED ORDER — ROCURONIUM BROMIDE 100 MG/10ML IV SOLN
INTRAVENOUS | Status: DC | PRN
  Administered 2013-04-05: 50 mg via INTRAVENOUS

## 2013-04-05 MED ORDER — ONDANSETRON HCL 4 MG/2ML IJ SOLN: 4.0000 mg | INTRAMUSCULAR | Status: DC | PRN

## 2013-04-05 MED ORDER — PHENOL 1.4 % MT LIQD: 1.0000 | OROMUCOSAL | Status: DC | PRN

## 2013-04-05 MED ORDER — GLIMEPIRIDE 1 MG PO TABS
1.0000 mg | ORAL_TABLET | Freq: Every day | ORAL | Status: DC
  Filled 2013-04-05: qty 1

## 2013-04-05 MED ORDER — THROMBIN 5000 UNITS EX SOLR
CUTANEOUS | Status: DC | PRN
  Administered 2013-04-05 (×2): 5000 [IU] via TOPICAL

## 2013-04-05 MED ORDER — CYCLOBENZAPRINE HCL 10 MG PO TABS: 10.0000 mg | ORAL_TABLET | Freq: Three times a day (TID) | ORAL | Status: DC | PRN

## 2013-04-05 MED ORDER — HYDROMORPHONE HCL PF 1 MG/ML IJ SOLN: 0.2500 mg | INTRAMUSCULAR | Status: DC | PRN

## 2013-04-05 MED ORDER — ROCURONIUM BROMIDE 50 MG/5ML IV SOLN
INTRAVENOUS | Status: AC
  Filled 2013-04-05: qty 1

## 2013-04-05 MED ORDER — FUROSEMIDE 40 MG PO TABS
40.0000 mg | ORAL_TABLET | Freq: Every day | ORAL | Status: DC
  Administered 2013-04-05: 40 mg via ORAL
  Filled 2013-04-05: qty 1

## 2013-04-05 MED ORDER — METOPROLOL SUCCINATE ER 50 MG PO TB24: 50.0000 mg | ORAL_TABLET | Freq: Every morning | ORAL | Status: DC

## 2013-04-05 MED ORDER — PROPOFOL 10 MG/ML IV BOLUS
INTRAVENOUS | Status: AC
Start: 2013-04-05 — End: 2013-04-05
  Filled 2013-04-05: qty 20

## 2013-04-05 MED ORDER — ONDANSETRON HCL 4 MG/2ML IJ SOLN: 4.0000 mg | Freq: Once | INTRAMUSCULAR | Status: DC | PRN

## 2013-04-05 MED ORDER — FENTANYL CITRATE 0.05 MG/ML IJ SOLN
INTRAMUSCULAR | Status: DC | PRN
  Administered 2013-04-05: 150 ug via INTRAVENOUS

## 2013-04-05 MED ORDER — 0.9 % SODIUM CHLORIDE (POUR BTL) OPTIME
TOPICAL | Status: DC | PRN
  Administered 2013-04-05: 1000 mL

## 2013-04-05 MED ORDER — ONDANSETRON HCL 4 MG/2ML IJ SOLN
INTRAMUSCULAR | Status: AC
  Filled 2013-04-05: qty 2

## 2013-04-05 MED ORDER — ALUM & MAG HYDROXIDE-SIMETH 200-200-20 MG/5ML PO SUSP: 30.0000 mL | Freq: Four times a day (QID) | ORAL | Status: DC | PRN

## 2013-04-05 MED ORDER — NEOSTIGMINE METHYLSULFATE 1 MG/ML IJ SOLN
INTRAMUSCULAR | Status: DC | PRN
  Administered 2013-04-05: 4.7 mg via INTRAVENOUS

## 2013-04-05 MED ORDER — DILTIAZEM HCL ER COATED BEADS 240 MG PO CP24
240.0000 mg | ORAL_CAPSULE | Freq: Every day | ORAL | Status: DC
  Administered 2013-04-05: 240 mg via ORAL
  Filled 2013-04-05: qty 1

## 2013-04-05 MED ORDER — SODIUM CHLORIDE 0.9 % IJ SOLN
3.0000 mL | Freq: Two times a day (BID) | INTRAMUSCULAR | Status: DC
  Administered 2013-04-05: 3 mL via INTRAVENOUS

## 2013-04-05 MED ORDER — ACETAMINOPHEN 160 MG/5ML PO SOLN
325.0000 mg | ORAL | Status: DC | PRN
  Filled 2013-04-05: qty 20.3

## 2013-04-05 MED ORDER — SENNA 8.6 MG PO TABS: 1.0000 | ORAL_TABLET | Freq: Two times a day (BID) | ORAL | Status: DC

## 2013-04-05 MED ORDER — LIDOCAINE HCL (CARDIAC) 20 MG/ML IV SOLN
INTRAVENOUS | Status: DC | PRN
  Administered 2013-04-05: 80 mg via INTRAVENOUS

## 2013-04-05 MED ORDER — HYDROMORPHONE HCL PF 1 MG/ML IJ SOLN: 0.5000 mg | INTRAMUSCULAR | Status: DC | PRN

## 2013-04-05 MED ORDER — PHENYLEPHRINE 40 MCG/ML (10ML) SYRINGE FOR IV PUSH (FOR BLOOD PRESSURE SUPPORT)
PREFILLED_SYRINGE | INTRAVENOUS | Status: AC
  Filled 2013-04-05: qty 10

## 2013-04-05 MED ORDER — GLYCOPYRROLATE 0.2 MG/ML IJ SOLN
INTRAMUSCULAR | Status: DC | PRN
  Administered 2013-04-05: 0.2 mg via INTRAVENOUS
  Administered 2013-04-05: .8 mg via INTRAVENOUS

## 2013-04-05 MED ORDER — PANTOPRAZOLE SODIUM 40 MG PO TBEC
80.0000 mg | DELAYED_RELEASE_TABLET | Freq: Every day | ORAL | Status: DC
  Administered 2013-04-05: 80 mg via ORAL
  Filled 2013-04-05: qty 2

## 2013-04-05 MED ORDER — ACETAMINOPHEN 325 MG PO TABS: 325.0000 mg | ORAL_TABLET | ORAL | Status: DC | PRN

## 2013-04-05 MED ORDER — BUPIVACAINE HCL (PF) 0.25 % IJ SOLN
INTRAMUSCULAR | Status: DC | PRN
  Administered 2013-04-05: 20 mL

## 2013-04-05 MED ORDER — OXYCODONE HCL 5 MG/5ML PO SOLN: 5.0000 mg | Freq: Once | ORAL | Status: DC | PRN

## 2013-04-05 MED ORDER — ACETAMINOPHEN 325 MG PO TABS: 650.0000 mg | ORAL_TABLET | ORAL | Status: DC | PRN

## 2013-04-05 MED ORDER — LACTATED RINGERS IV SOLN
INTRAVENOUS | Status: DC | PRN
Start: 2013-04-05 — End: 2013-04-05
  Administered 2013-04-05 (×2): via INTRAVENOUS

## 2013-04-05 MED ORDER — PROPOFOL 10 MG/ML IV BOLUS
INTRAVENOUS | Status: DC | PRN
  Administered 2013-04-05: 130 mg via INTRAVENOUS

## 2013-04-05 MED ORDER — CEFAZOLIN SODIUM 1-5 GM-% IV SOLN
1.0000 g | Freq: Three times a day (TID) | INTRAVENOUS | Status: DC
  Administered 2013-04-05: 1 g via INTRAVENOUS

## 2013-04-05 MED ORDER — OXYCODONE HCL 5 MG PO TABS: 5.0000 mg | ORAL_TABLET | Freq: Once | ORAL | Status: DC | PRN

## 2013-04-05 SURGICAL SUPPLY — 61 items
ADH SKN CLS APL DERMABOND .7 (GAUZE/BANDAGES/DRESSINGS)
ADH SKN CLS LQ APL DERMABOND (GAUZE/BANDAGES/DRESSINGS) ×1
APL SKNCLS STERI-STRIP NONHPOA (GAUZE/BANDAGES/DRESSINGS) ×1
BAG DECANTER FOR FLEXI CONT (MISCELLANEOUS) ×3 IMPLANT
BENZOIN TINCTURE PRP APPL 2/3 (GAUZE/BANDAGES/DRESSINGS) ×3 IMPLANT
BLADE SURG ROTATE 9660 (MISCELLANEOUS) IMPLANT
BRUSH SCRUB EZ PLAIN DRY (MISCELLANEOUS) ×3 IMPLANT
BUR CUTTER 7.0 ROUND (BURR) ×3 IMPLANT
CANISTER SUCT 3000ML (MISCELLANEOUS) ×3 IMPLANT
CLOSURE WOUND 1/2 X4 (GAUZE/BANDAGES/DRESSINGS) ×1
CONT SPEC 4OZ CLIKSEAL STRL BL (MISCELLANEOUS) ×3 IMPLANT
DECANTER SPIKE VIAL GLASS SM (MISCELLANEOUS) ×3 IMPLANT
DERMABOND ADHESIVE PROPEN (GAUZE/BANDAGES/DRESSINGS) ×2
DERMABOND ADVANCED (GAUZE/BANDAGES/DRESSINGS)
DERMABOND ADVANCED .7 DNX12 (GAUZE/BANDAGES/DRESSINGS) ×1 IMPLANT
DERMABOND ADVANCED .7 DNX6 (GAUZE/BANDAGES/DRESSINGS) IMPLANT
DRAPE LAPAROTOMY 100X72X124 (DRAPES) ×3 IMPLANT
DRAPE MICROSCOPE LEICA (MISCELLANEOUS) ×2 IMPLANT
DRAPE MICROSCOPE ZEISS OPMI (DRAPES) ×1 IMPLANT
DRAPE POUCH INSTRU U-SHP 10X18 (DRAPES) ×3 IMPLANT
DRAPE PROXIMA HALF (DRAPES) IMPLANT
DRAPE SURG 17X23 STRL (DRAPES) ×6 IMPLANT
DURAPREP 26ML APPLICATOR (WOUND CARE) ×3 IMPLANT
ELECT REM PT RETURN 9FT ADLT (ELECTROSURGICAL) ×3
ELECTRODE REM PT RTRN 9FT ADLT (ELECTROSURGICAL) ×1 IMPLANT
GAUZE SPONGE 4X4 16PLY XRAY LF (GAUZE/BANDAGES/DRESSINGS) IMPLANT
GLOVE BIOGEL PI IND STRL 7.0 (GLOVE) IMPLANT
GLOVE BIOGEL PI IND STRL 8 (GLOVE) IMPLANT
GLOVE BIOGEL PI INDICATOR 7.0 (GLOVE) ×4
GLOVE BIOGEL PI INDICATOR 8 (GLOVE) ×2
GLOVE ECLIPSE 7.5 STRL STRAW (GLOVE) ×2 IMPLANT
GLOVE ECLIPSE 8.5 STRL (GLOVE) ×3 IMPLANT
GLOVE EXAM NITRILE LRG STRL (GLOVE) IMPLANT
GLOVE EXAM NITRILE MD LF STRL (GLOVE) ×2 IMPLANT
GLOVE EXAM NITRILE XL STR (GLOVE) IMPLANT
GLOVE EXAM NITRILE XS STR PU (GLOVE) IMPLANT
GLOVE SURG SS PI 7.0 STRL IVOR (GLOVE) ×6 IMPLANT
GOWN BRE IMP SLV AUR LG STRL (GOWN DISPOSABLE) IMPLANT
GOWN BRE IMP SLV AUR XL STRL (GOWN DISPOSABLE) ×1 IMPLANT
GOWN STRL REIN 2XL LVL4 (GOWN DISPOSABLE) IMPLANT
GOWN STRL REUS W/ TWL XL LVL3 (GOWN DISPOSABLE) IMPLANT
GOWN STRL REUS W/TWL XL LVL3 (GOWN DISPOSABLE) ×12
KIT BASIN OR (CUSTOM PROCEDURE TRAY) ×3 IMPLANT
KIT ROOM TURNOVER OR (KITS) ×3 IMPLANT
NDL SPNL 22GX3.5 QUINCKE BK (NEEDLE) ×1 IMPLANT
NEEDLE HYPO 22GX1.5 SAFETY (NEEDLE) ×3 IMPLANT
NEEDLE SPNL 22GX3.5 QUINCKE BK (NEEDLE) ×3 IMPLANT
NS IRRIG 1000ML POUR BTL (IV SOLUTION) ×3 IMPLANT
PACK LAMINECTOMY NEURO (CUSTOM PROCEDURE TRAY) ×3 IMPLANT
PAD ARMBOARD 7.5X6 YLW CONV (MISCELLANEOUS) ×9 IMPLANT
RUBBERBAND STERILE (MISCELLANEOUS) ×6 IMPLANT
SPONGE GAUZE 4X4 12PLY (GAUZE/BANDAGES/DRESSINGS) ×3 IMPLANT
SPONGE SURGIFOAM ABS GEL SZ50 (HEMOSTASIS) ×3 IMPLANT
STRIP CLOSURE SKIN 1/2X4 (GAUZE/BANDAGES/DRESSINGS) ×2 IMPLANT
SUT VIC AB 2-0 CT1 18 (SUTURE) ×3 IMPLANT
SUT VIC AB 3-0 SH 8-18 (SUTURE) ×3 IMPLANT
SYR 20ML ECCENTRIC (SYRINGE) ×3 IMPLANT
TAPE CLOTH SURG 4X10 WHT LF (GAUZE/BANDAGES/DRESSINGS) ×2 IMPLANT
TOWEL OR 17X24 6PK STRL BLUE (TOWEL DISPOSABLE) ×3 IMPLANT
TOWEL OR 17X26 10 PK STRL BLUE (TOWEL DISPOSABLE) ×3 IMPLANT
WATER STERILE IRR 1000ML POUR (IV SOLUTION) ×3 IMPLANT

## 2013-04-05 NOTE — Preoperative (Signed)
Beta Blockers   Reason not to administer Beta Blockers:Not Applicable 

## 2013-04-05 NOTE — Anesthesia Preprocedure Evaluation (Addendum)
Anesthesia Evaluation  Patient identified by MRN, date of birth, ID band Patient awake    Reviewed: Allergy & Precautions, H&P , NPO status , Patient's Chart, lab work & pertinent test results, reviewed documented beta blocker date and time   History of Anesthesia Complications Negative for: history of anesthetic complications  Airway Mallampati: II TM Distance: >3 FB Neck ROM: Full    Dental  (+) Edentulous Upper, Partial Lower,    Pulmonary neg sleep apnea, neg COPDformer smoker,  breath sounds clear to auscultation        Cardiovascular hypertension, Pt. on medications and Pt. on home beta blockers + CAD, + Cardiac Stents, + Peripheral Vascular Disease and +CHF + dysrhythmias Atrial Fibrillation + Valvular Problems/Murmurs Rhythm:Irregular Rate:Normal + Systolic murmurs Ef 16%, posterior basal hypokinesis   Neuro/Psych Lower back pain with numbness down right leg  Neuromuscular disease negative psych ROS   GI/Hepatic Neg liver ROS, GERD-  Medicated and Controlled,  Endo/Other  diabetes, Type 2  Renal/GU Renal InsufficiencyRenal disease     Musculoskeletal   Abdominal   Peds  Hematology  (+) anemia , Chronic anticoagulation   Anesthesia Other Findings   Reproductive/Obstetrics                          Anesthesia Physical Anesthesia Plan  ASA: III  Anesthesia Plan: General   Post-op Pain Management:    Induction: Intravenous  Airway Management Planned: Oral ETT  Additional Equipment: None  Intra-op Plan:   Post-operative Plan: Extubation in OR  Informed Consent: I have reviewed the patients History and Physical, chart, labs and discussed the procedure including the risks, benefits and alternatives for the proposed anesthesia with the patient or authorized representative who has indicated his/her understanding and acceptance.   Dental advisory given  Plan Discussed with: CRNA,  Anesthesiologist and Surgeon  Anesthesia Plan Comments:        Anesthesia Quick Evaluation

## 2013-04-05 NOTE — Discharge Instructions (Signed)

## 2013-04-05 NOTE — H&P (Signed)
Greg Booker is an 80 y.o. male.   Chief Complaint: Right leg pain HPI: Patient is an 81 year old male with back and right lower extremity pain consistent with a right-sided L4 radiculopathy and failed conservative management workup demonstrates evidence of significant spondylosis and stenosis on the right L3-4. Patient status post decompressive surgery on the left at L3-4 with good results. He presents now for right-sided L3 for decompressive surgery.  Past Medical History  Diagnosis Date  . GERD (gastroesophageal reflux disease)   . Hyperlipidemia   . S/P primary angioplasty with coronary stent   . Calcified granuloma of lung RIGHT LUNG BASE - STABLE PER CXR 03-03-2010  . Mitral regurgitation   . Chronic back pain   . Sciatic nerve pain LEFT LEG PAIN--  EPI INJECTIONS  . Low back pain radiating to left leg   . Numbness and tingling of left leg   . Impaired hearing BILATERAL AIDS  . Nocturia   . Prostate cancer RECUR    S/P RADIATIVE SEEDS 2006  . Anticoagulated on Coumadin   . PAF (paroxysmal atrial fibrillation)   . Heart murmur   . Peripheral vascular disease     dvt lft leg 10 yrs ago  . Arthritis   . S/P radiation therapy   . Dysrhythmia   . Pneumonia 01/2013  . Diabetes mellitus without complication   . Coronary artery disease     Cardiologist Dr. Debara Pickett    Past Surgical History  Procedure Laterality Date  . Laminectomy  10-28-2008    L3 - 4  . Excision benign breast mass  08-06-2008    LEFT  . Cardioversion  09-15-2005    UNSUCCESSFUL  . Radioactive seed implant  08-03-2004    PROSTATE  . Cardiovascular stress test  06-03-2008    NORMAL STUDY/ LV NORMAL / COMPARED TO 12-26-2000 INFERIOR WALL ISCHEMIA IS NO LONGER PRESENT.  . Transthoracic echocardiogram  07-26-2007    LVSF NORMAL/ EF 55%/ LEFT ATRIUM 4.5CM MILD TO MOD. DILATED/ MILD  MITRAL & TRICUSPID REGURG  . Cryoablation  03/01/2011    Procedure: CRYO ABLATION PROSTATE;  Surgeon: Ailene Rud, MD;   Location: [facility];  Service: Urology;  Laterality: N/A;  . Coronary angioplasty with stent placement  1997- DR AL LITTLE    X1 STENT RCA  . Cardiac catheterization  1998    IN-STENT RESTENOSIS-- LARGE COLLATERALS  . Cardiac catheterization  2003  &  2006    2006 ---  MITRAL REGURG/ 100% OCCULSION RCA WITH LARGE COLLATERALS/ MILD DISEASE LAD & CIRCUMFLEX  . Total hip arthroplasty  11/02/2011    Procedure: TOTAL HIP ARTHROPLASTY;  Surgeon: Garald Balding, MD;  Location: [facility];  Service: Orthopedics;  Laterality: Left;  Left Total Hip Replacement  . Joint replacement      Family History  Problem Relation Age of Onset  . Cancer Sister   . Heart Problems Father   . Heart Problems Brother    Social History:  reports that he quit smoking about 41 years ago. His smoking use included Cigarettes and Pipe. He has a 25 pack-year smoking history. He has never used smokeless tobacco. He reports that he does not drink alcohol or use illicit drugs.  Allergies:  Allergies  Allergen Reactions  . Bicalutamide   . Lovastatin Other (See Comments)    Leg weakness  . Pravastatin Other (See Comments)    myalgias  . Codeine Itching and Rash    Medications Prior to  Admission  Medication Sig Dispense Refill  . b complex vitamins tablet Take 1 tablet by mouth daily.      . Cholecalciferol (VITAMIN D3) 1000 UNITS CAPS Take 1,000 Units by mouth daily.       . digoxin (LANOXIN) 0.125 MG tablet Take 0.125 mg by mouth daily.      Marland Kitchen diltiazem (CARDIZEM CD) 240 MG 24 hr capsule Take 240 mg by mouth daily.       Marland Kitchen enoxaparin (LOVENOX) 80 MG/0.8ML injection Inject 80 mg into the skin daily.      . furosemide (LASIX) 40 MG tablet Take 40 mg by mouth daily.      Marland Kitchen glimepiride (AMARYL) 1 MG tablet Take 1 mg by mouth daily.      . isosorbide mononitrate (IMDUR) 60 MG 24 hr tablet Take 60 mg by mouth every morning.      Marland Kitchen lisinopril (PRINIVIL,ZESTRIL) 10 MG tablet Take 10 mg by mouth daily.       . metoprolol succinate (TOPROL-XL) 50 MG 24 hr tablet Take 50 mg by mouth every morning. Take with or immediately following a meal.      . niacin 500 MG tablet Take 500 mg by mouth daily with breakfast.      . omeprazole (PRILOSEC) 20 MG capsule Take 20 mg by mouth daily.      Marland Kitchen oxybutynin (DITROPAN) 5 MG tablet Take 5 mg by mouth daily.       . Tamsulosin HCl (FLOMAX) 0.4 MG CAPS Take 0.4 mg by mouth daily after supper.      . vitamin C (ASCORBIC ACID) 500 MG tablet Take 500 mg by mouth daily.      Marland Kitchen warfarin (COUMADIN) 5 MG tablet Take 2.5-5 mg by mouth daily. 2.5 mg on all days except Wednesday when he takes 5mg       . acetaminophen (TYLENOL) 500 MG tablet Take 500 mg by mouth every 6 (six) hours as needed for pain.      Marland Kitchen enoxaparin (LOVENOX) 80 MG/0.8ML injection Inject 0.8 mLs (80 mg total) into the skin every 12 (twelve) hours.  16 Syringe  0  . pantoprazole (PROTONIX) 40 MG tablet Take 40 mg by mouth daily.        Results for orders placed during the hospital encounter of 04/05/13 (from the past 48 hour(s))  APTT     Status: None   Collection Time    04/05/13  6:31 AM      Result Value Ref Range   aPTT 36  24 - 37 seconds  PROTIME-INR     Status: None   Collection Time    04/05/13  6:31 AM      Result Value Ref Range   Prothrombin Time 15.0  11.6 - 15.2 seconds   INR 1.21  0.00 - 1.49  GLUCOSE, CAPILLARY     Status: Abnormal   Collection Time    04/05/13  6:34 AM      Result Value Ref Range   Glucose-Capillary 107 (*) 70 - 99 mg/dL   Dg Chest 2 View  04/03/2013   CLINICAL DATA:  Cough.  EXAM: CHEST  2 VIEW  COMPARISON:  DG CHEST 2 VIEW dated 10/29/2011  FINDINGS: Mediastinum and hilar structures are normal. The lungs are clear of acute infiltrates. Pleural parenchymal thickening is noted bilaterally particularly in the left lower lobe. These findings are most consistent with scarring. Mild left lower lobe infiltrate cannot be excluded. Similar findings noted on prior study.  Calcified pulmonary nodules right lung base consistent with granulomas. These are stable. No pleural effusion or pneumothorax. No acute bony abnormality. Degenerative changes thoracic spine.  IMPRESSION: 1. Pleural parenchymal thickening particularly in the left lung base. These findings are most consistent scarring. Mild pneumonia left lower lobe cannot be excluded. Similar findings noted on prior study. 2. Stable calcified pulmonary nodules right lung base consistent with old granulomas.   Electronically Signed   By: Marcello Moores  Register   On: 04/03/2013 10:08    Review of Systems  Constitutional: Negative.   HENT: Negative.   Eyes: Negative.   Respiratory: Negative.   Cardiovascular: Negative.   Gastrointestinal: Negative.   Genitourinary: Negative.   Musculoskeletal: Negative.   Skin: Negative.   Neurological: Negative.   Endo/Heme/Allergies: Negative.   Psychiatric/Behavioral: Negative.     Blood pressure 146/83, pulse 62, temperature 98.7 F (37.1 C), temperature source Oral, weight 78.926 kg (174 lb), SpO2 97.00%. Physical Exam  Constitutional: He is oriented to person, place, and time. He appears well-developed and well-nourished. No distress.  HENT:  Head: Normocephalic and atraumatic.  Right Ear: External ear normal.  Left Ear: External ear normal.  Nose: Nose normal.  Mouth/Throat: Oropharynx is clear and moist. No oropharyngeal exudate.  Eyes: Conjunctivae and EOM are normal. Pupils are equal, round, and reactive to light.  Neck: Normal range of motion. Neck supple. No tracheal deviation present. No thyromegaly present.  Cardiovascular: Normal rate, regular rhythm, normal heart sounds and intact distal pulses.  Exam reveals no friction rub.   No murmur heard. Respiratory: Effort normal and breath sounds normal. No respiratory distress. He has no wheezes.  GI: Soft. Bowel sounds are normal. He exhibits no distension. There is no tenderness.  Neurological: He is alert and  oriented to person, place, and time. He has normal reflexes. He displays normal reflexes. No cranial nerve deficit. He exhibits normal muscle tone. Coordination normal.  Skin: Skin is warm and dry. No rash noted. He is not diaphoretic. No erythema.  Psychiatric: He has a normal mood and affect. His behavior is normal. Judgment and thought content normal.     Assessment/Plan Right L3-4 spondylosis with stenosis and radiculopathy. Plan right L3 for laminotomy and foraminotomies. Risks and benefits have been explained. Patient wishes to proceed.  Arty Lantzy A 04/05/2013, 7:53 AM

## 2013-04-05 NOTE — Brief Op Note (Signed)
04/05/2013  9:05 AM  PATIENT:  Greg Booker  80 y.o. male  PRE-OPERATIVE DIAGNOSIS:  spondylosis  POST-OPERATIVE DIAGNOSIS:  spondylosis  PROCEDURE:  Procedure(s): Right Lumbar three-four Laminectomy  (Right)  SURGEON:  Surgeon(s) and Role:    * Charlie Pitter, MD - Primary    * Hosie Spangle, MD - Assisting  PHYSICIAN ASSISTANT:   ASSISTANTS:    ANESTHESIA:   general  EBL:  Total I/O In: 1200 [I.V.:1200] Out: 25 [Blood:25]  BLOOD ADMINISTERED:none  DRAINS: none   LOCAL MEDICATIONS USED:  MARCAINE     SPECIMEN:  No Specimen  DISPOSITION OF SPECIMEN:  N/A  COUNTS:  YES  TOURNIQUET:  * No tourniquets in log *  DICTATION: .Dragon Dictation  PLAN OF CARE: Admit for overnight observation  PATIENT DISPOSITION:  PACU - hemodynamically stable.   Delay start of Pharmacological VTE agent (>24hrs) due to surgical blood loss or risk of bleeding: yes

## 2013-04-05 NOTE — Progress Notes (Signed)
PT. UP AMBULATING WITH STEADY GAIT, TOLERATING DIET, DENIES PAIN. VITAL SIGNS WITHIN NORMAL LIMITS. PT. VERBALIZED UNDERSTANDING OF DISCHARGE ORDERS. PT. DISCHARGED VIA WHEELCHAIR WITH STAFF AND SPOUSE ASSISTING PT. TO CAR.  Many RN

## 2013-04-05 NOTE — Anesthesia Postprocedure Evaluation (Signed)
  Anesthesia Post-op Note  Patient: Greg Booker  Procedure(s) Performed: Procedure(s): Right Lumbar three-four Laminectomy  (Right)  Patient Location: PACU  Anesthesia Type:General  Level of Consciousness: awake, alert  and oriented  Airway and Oxygen Therapy: Patient Spontanous Breathing  Post-op Pain: mild  Post-op Assessment: Post-op Vital signs reviewed, Patient's Cardiovascular Status Stable, Respiratory Function Stable, Patent Airway, No signs of Nausea or vomiting and Pain level controlled  Post-op Vital Signs: Reviewed and stable  Complications: No apparent anesthesia complications

## 2013-04-05 NOTE — Discharge Summary (Signed)
Physician Discharge Summary  Patient ID: Greg Booker MRN: [id number] DOB/AGE: [id number] 78 y.o.  Admit date: 04/05/2013 Discharge date: 04/05/2013  Admission Diagnoses:  Discharge Diagnoses:  Active Problems:   Lumbosacral spondylosis without myelopathy   Spondylosis of lumbar joint   Discharged Condition: good  Hospital Course: Patient admitted to the hospital where he underwent an uncomplicated lumbar decompressive laminotomy and foraminotomies. Postoperatively he is doing well. Back and leg pain much better. Strength sensation intact. Ambulating without difficulty and ready for discharge home.  Consults:   Significant Diagnostic Studies:   Treatments:   Discharge Exam: Blood pressure 163/69, pulse 66, temperature 98.7 F (37.1 C), temperature source Oral, resp. rate 16, weight 78.926 kg (174 lb), SpO2 94.00%. Awake and alert. Oriented and appropriate. Motor and sensory function intact. Wound clean and dry. Chest and abdomen benign.  Disposition: 03-Skilled Nursing Facility     Medication List         acetaminophen 500 MG tablet  Commonly known as:  TYLENOL  Take 500 mg by mouth every 6 (six) hours as needed for pain.     b complex vitamins tablet  Take 1 tablet by mouth daily.     digoxin 0.125 MG tablet  Commonly known as:  LANOXIN  Take 0.125 mg by mouth daily.     diltiazem 240 MG 24 hr capsule  Commonly known as:  CARDIZEM CD  Take 240 mg by mouth daily.     enoxaparin 80 MG/0.8ML injection  Commonly known as:  LOVENOX  Inject 80 mg into the skin daily.     enoxaparin 80 MG/0.8ML injection  Commonly known as:  LOVENOX  Inject 0.8 mLs (80 mg total) into the skin every 12 (twelve) hours.     furosemide 40 MG tablet  Commonly known as:  LASIX  Take 40 mg by mouth daily.     glimepiride 1 MG tablet  Commonly known as:  AMARYL  Take 1 mg by mouth daily.     HYDROcodone-acetaminophen 5-325 MG per tablet  Commonly known as:  NORCO/VICODIN  Take  1-2 tablets by mouth every 4 (four) hours as needed for moderate pain.     isosorbide mononitrate 60 MG 24 hr tablet  Commonly known as:  IMDUR  Take 60 mg by mouth every morning.     lisinopril 10 MG tablet  Commonly known as:  PRINIVIL,ZESTRIL  Take 10 mg by mouth daily.     metoprolol succinate 50 MG 24 hr tablet  Commonly known as:  TOPROL-XL  Take 50 mg by mouth every morning. Take with or immediately following a meal.     niacin 500 MG tablet  Take 500 mg by mouth daily with breakfast.     omeprazole 20 MG capsule  Commonly known as:  PRILOSEC  Take 20 mg by mouth daily.     oxybutynin 5 MG tablet  Commonly known as:  DITROPAN  Take 5 mg by mouth daily.     pantoprazole 40 MG tablet  Commonly known as:  PROTONIX  Take 40 mg by mouth daily.     tamsulosin 0.4 MG Caps capsule  Commonly known as:  FLOMAX  Take 0.4 mg by mouth daily after supper.     vitamin C 500 MG tablet  Commonly known as:  ASCORBIC ACID  Take 500 mg by mouth daily.     Vitamin D3 1000 UNITS Caps  Take 1,000 Units by mouth daily.     warfarin 5 MG tablet  Commonly known as:  COUMADIN  Take 2.5-5 mg by mouth daily. 2.5 mg on all days except Wednesday when he takes 5mg          Signed: Deirdra Heumann A 04/05/2013, 7:32 PM

## 2013-04-05 NOTE — Plan of Care (Signed)
Problem: Consults Goal: Diagnosis - Spinal Surgery Outcome: Completed/Met Date Met:  04/05/13 Lumbar Laminectomy (Complex)

## 2013-04-05 NOTE — Op Note (Signed)
Date of procedure: 04/05/2013  Date of dictation: Same  Service: Neurosurgery  Preoperative diagnosis: Right L3-4 spondylosis with stenosis and radiculopathy  Postoperative diagnosis: Same  Procedure Name: Right L3-4 hemilaminectomy with right L3 and right L4 decompressive foraminotomies  Surgeon:Wendi Lastra A.Jaysie Benthall, M.D.  Asst. Surgeon: Sherwood Gambler  Anesthesia: General  Indication: 80 year old male with right lower chamois pain paresthesias and weakness consistent with a right L3 and right L4 radiculopathy failing conservative Psychologist, sport and exercise. Workup demonstrates evidence of spondylosis and stenosis on the right L3-4. Patient presents now for decompressive surgery in hopes of improving his symptoms.  Operative note: After induction anesthesia, patient positioned prone onto Wilson frame and appropriately padded. Lumbar region prepped and draped. Incision made overlying L3-4. Dissection performed the right side. Retractor placed. X-ray taken. L2-3 level exposed. Dissection was redirected 1 level caudally. Retractor replaced. Laminotomy performed using high-speed drill and Kerrison rongeurs. I the entire right L3 hemilamina was removed. Epidural venous plexus was coagulated and cut. Ligament flavum elevated and resected so fashion. Decompressive foraminotomies form of course exiting L3 and L4 nerve root. At this point a very 30 depression achieved. There is no his injury to thecal sac or nerve roots. Wound is then irrigated and bike solution. Gelfoam was placed topically for hemostasis then the beaded. Microscope necrosis and were removed. Hemostasis musculature for was and close in layers with Vicryl sutures. Steri-Strips and sterile dressing were applied. There were no apparent complications. Patient tolerated the procedure well and he returns to the recovery postop.

## 2013-04-05 NOTE — Transfer of Care (Signed)
Immediate Anesthesia Transfer of Care Note  Patient: Greg Booker  Procedure(s) Performed: Procedure(s): Right Lumbar three-four Laminectomy  (Right)  Patient Location: PACU  Anesthesia Type:General  Level of Consciousness: awake, alert  and oriented  Airway & Oxygen Therapy: Patient Spontanous Breathing and Patient connected to face mask oxygen  Post-op Assessment: Report given to PACU RN  Post vital signs: Reviewed and stable  Complications: No apparent anesthesia complications

## 2013-04-06 ENCOUNTER — Encounter (HOSPITAL_COMMUNITY): Payer: Self-pay | Admitting: Neurosurgery

## 2013-04-11 ENCOUNTER — Ambulatory Visit (INDEPENDENT_AMBULATORY_CARE_PROVIDER_SITE_OTHER): Payer: Medicare Other | Admitting: Pharmacist Clinician (PhC)/ Clinical Pharmacy Specialist

## 2013-04-11 ENCOUNTER — Telehealth: Payer: Self-pay | Admitting: Internal Medicine

## 2013-04-11 DIAGNOSIS — I482 Chronic atrial fibrillation, unspecified: Secondary | ICD-10-CM

## 2013-04-11 DIAGNOSIS — I4891 Unspecified atrial fibrillation: Secondary | ICD-10-CM

## 2013-04-11 DIAGNOSIS — Z7901 Long term (current) use of anticoagulants: Secondary | ICD-10-CM

## 2013-04-11 LAB — POCT INR: INR: 1.9

## 2013-04-11 NOTE — Telephone Encounter (Signed)
Call from Dr. Rudene Anda office.  Stated pt is there and he wants to use Voltaren gel on pt's knee.  Wants to know if it is okay to use w/ pt being on warfarin.    Tommy Medal, PharmD notified and advised it can be used as it will not go through the liver and will not affect warfarin.  Informed and verbalized understanding.

## 2013-04-11 NOTE — Telephone Encounter (Signed)
message

## 2013-05-02 ENCOUNTER — Ambulatory Visit (INDEPENDENT_AMBULATORY_CARE_PROVIDER_SITE_OTHER): Payer: Medicare Other | Admitting: Pharmacist Clinician (PhC)/ Clinical Pharmacy Specialist

## 2013-05-02 DIAGNOSIS — Z7901 Long term (current) use of anticoagulants: Secondary | ICD-10-CM

## 2013-05-02 DIAGNOSIS — I482 Chronic atrial fibrillation, unspecified: Secondary | ICD-10-CM

## 2013-05-02 DIAGNOSIS — I4891 Unspecified atrial fibrillation: Secondary | ICD-10-CM

## 2013-05-02 LAB — POCT INR: INR: 1.7

## 2013-05-16 ENCOUNTER — Ambulatory Visit (INDEPENDENT_AMBULATORY_CARE_PROVIDER_SITE_OTHER): Payer: Medicare Other | Admitting: Pharmacist Clinician (PhC)/ Clinical Pharmacy Specialist

## 2013-05-16 DIAGNOSIS — Z7901 Long term (current) use of anticoagulants: Secondary | ICD-10-CM

## 2013-05-16 DIAGNOSIS — I4891 Unspecified atrial fibrillation: Secondary | ICD-10-CM

## 2013-05-16 DIAGNOSIS — I482 Chronic atrial fibrillation, unspecified: Secondary | ICD-10-CM

## 2013-05-16 LAB — POCT INR: INR: 2.4

## 2013-06-08 ENCOUNTER — Other Ambulatory Visit: Payer: Self-pay | Admitting: *Deleted

## 2013-06-08 ENCOUNTER — Encounter (HOSPITAL_COMMUNITY): Payer: Self-pay | Admitting: *Deleted

## 2013-06-08 DIAGNOSIS — I25810 Atherosclerosis of coronary artery bypass graft(s) without angina pectoris: Secondary | ICD-10-CM

## 2013-06-13 ENCOUNTER — Ambulatory Visit (INDEPENDENT_AMBULATORY_CARE_PROVIDER_SITE_OTHER): Payer: Medicare Other | Admitting: Pharmacist Clinician (PhC)/ Clinical Pharmacy Specialist

## 2013-06-13 ENCOUNTER — Other Ambulatory Visit: Payer: Self-pay | Admitting: Pharmacist Clinician (PhC)/ Clinical Pharmacy Specialist

## 2013-06-13 DIAGNOSIS — I4891 Unspecified atrial fibrillation: Secondary | ICD-10-CM

## 2013-06-13 DIAGNOSIS — Z7901 Long term (current) use of anticoagulants: Secondary | ICD-10-CM

## 2013-06-13 DIAGNOSIS — I482 Chronic atrial fibrillation, unspecified: Secondary | ICD-10-CM

## 2013-06-13 LAB — POCT INR: INR: 4.5

## 2013-06-13 MED ORDER — ENOXAPARIN SODIUM 80 MG/0.8ML ~~LOC~~ SOLN: 80.0000 mg | Freq: Two times a day (BID) | SUBCUTANEOUS | Status: DC

## 2013-06-13 NOTE — Patient Instructions (Signed)
Enoxaprin Dosing Schedule  Enoxparin dose:  Date  Warfarin Dose (evenings) Enoxaprin Dose  6-10 6 1/2 tablet   6-11 5 0   6-12 4 0 8am    8pm  6-13 3 0 8am    8pm  6-14 2 0 8am    8pm  6-15 1 0 8am  6-16 Procedure Hospital to dose    1     2     3     4     5      6

## 2013-06-14 ENCOUNTER — Telehealth (HOSPITAL_COMMUNITY): Payer: Self-pay

## 2013-06-15 ENCOUNTER — Encounter (HOSPITAL_COMMUNITY): Payer: Self-pay | Admitting: Pharmacy Technician

## 2013-06-15 NOTE — Pre-Procedure Instructions (Signed)
Greg Booker  [birth year]   Your procedure is scheduled on:  Tuesday June 26, 2013 at 10:15 AM.  Report to [facility] Admitting at 8:15 AM.  Call this number if you have problems the morning of surgery: [phone]   Remember:   Do not eat food or drink liquids after midnight.   Take these medicines the morning of surgery with A SIP OF WATER: Acetaminophen (Tylenol) if needed, Diltiazem (Cardizem), Hydrocodone if needed, Isosorbide (Imdur), Metoprolol (Toprol XL), Omeprazole (Prilosec), Oxybutynin (Ditropan), Pantoprazole (Protonix)   Do NOT take any diabetic medications the morning of your surgery   Do not wear jewelry.  Do not wear lotions, powders, or colognes.   Men may shave face and neck.  Do not bring valuables to the hospital.  [facility] is not responsible for any belongings or valuables.               Contacts, dentures or bridgework may not be worn into surgery.  Leave suitcase in the car. After surgery it may be brought to your room.  For patients admitted to the hospital, discharge time is determined by your treatment team.               Patients discharged the day of surgery will not be allowed to drive home.  Name and phone number of your driver: Family/Friend  Special Instructions: Shower the night before and the morning of your surgery using CHG soap   Please read over the following fact sheets that you were given: Pain Booklet, Coughing and Deep Breathing, Blood Transfusion Information, Total Joint Packet, MRSA Information and Surgical Site Infection Prevention

## 2013-06-18 ENCOUNTER — Encounter (HOSPITAL_COMMUNITY): Payer: Self-pay

## 2013-06-18 ENCOUNTER — Encounter (HOSPITAL_COMMUNITY)
Admission: RE | Admit: 2013-06-18 | Discharge: 2013-06-18 | Disposition: A | Discharge status: Home / Self Care | Payer: Medicare Other | Source: Ambulatory Visit | Attending: Orthopaedic Surgery | Admitting: Orthopaedic Surgery

## 2013-06-18 HISTORY — DX: Urgency of urination: R39.15

## 2013-06-18 LAB — TYPE AND SCREEN
ABO/RH(D): A NEG
Antibody Screen: NEGATIVE

## 2013-06-18 LAB — CBC
HCT: 38.2 % — ABNORMAL LOW (ref 39.0–52.0)
Hemoglobin: 12.9 g/dL — ABNORMAL LOW (ref 13.0–17.0)
MCH: 32.2 pg (ref 26.0–34.0)
MCHC: 33.8 g/dL (ref 30.0–36.0)
MCV: 95.3 fL (ref 78.0–100.0)
PLATELETS: 236 10*3/uL (ref 150–400)
RBC: 4.01 MIL/uL — ABNORMAL LOW (ref 4.22–5.81)
RDW: 14.5 % (ref 11.5–15.5)
WBC: 10 10*3/uL (ref 4.0–10.5)

## 2013-06-18 LAB — BASIC METABOLIC PANEL
BUN: 28 mg/dL — ABNORMAL HIGH (ref 6–23)
CALCIUM: 9.6 mg/dL (ref 8.4–10.5)
CO2: 24 mEq/L (ref 19–32)
CREATININE: 1.31 mg/dL (ref 0.50–1.35)
Chloride: 102 mEq/L (ref 96–112)
GFR calc Af Amer: 57 mL/min — ABNORMAL LOW (ref >90)
GFR calc non Af Amer: 49 mL/min — ABNORMAL LOW (ref >90)
Glucose, Bld: 112 mg/dL — ABNORMAL HIGH (ref 70–99)
Potassium: 4.1 mEq/L (ref 3.7–5.3)
Sodium: 141 mEq/L (ref 137–147)

## 2013-06-18 LAB — PROTIME-INR
INR: 2.12 — ABNORMAL HIGH (ref 0.00–1.49)
Prothrombin Time: 23.1 seconds — ABNORMAL HIGH (ref 11.6–15.2)

## 2013-06-18 LAB — SURGICAL PCR SCREEN
MRSA, PCR: NEGATIVE
STAPHYLOCOCCUS AUREUS: NEGATIVE

## 2013-06-18 LAB — APTT: APTT: 45 s — AB (ref 24–37)

## 2013-06-18 NOTE — Progress Notes (Signed)
PCP is Willey Blade and Cardiologist is [employer]. Patient informed Nurse that he was scheduled to have a stress test on Thursday 06/21/13, and that he had a cardiac cath and had one stent placed. Patient denied having any acute cardiac or pulmonary issues. When asked about Coumadin patient informed Nurse that he will stop Coumadin on 06/20/13 and start Lovenox injections on 06/21/13.

## 2013-06-19 NOTE — Progress Notes (Addendum)
Anesthesia chart review: Patient is an 80 year old male scheduled for right THA on 06/26/13 by Dr. Durward Fortes.  History includes former smoker CAD s/p RCA stent '97, chronic afib s/p unsuccessful cardioversion 09/2005, GERD, prior PNA, hyperlipidemia, impaired hearing, prostate cancer s/p radioactive seed implant '06 and cryotherapy '13, diabetes mellitus type 2, LLE DVT 10 years ago, left THA '13, right L3-4 laminectomy 04/05/13.  PCP is Dr. Willey Blade. Cardiologist is Dr. Debara Pickett.  Patient is scheduled for a stress test on 6/11/5.  He reports that he is scheduled to stop Coumadin on 06/20/13 and start a Lovenox bridge on 06/21/13.     EKG on 02/02/13 showed afib, septal infarct (age undetermined). HR was 87 bpm at PAT.   Echo on 07/26/2007 showed mild concentric left ventricular hypertrophy, EF 55%, left ventricular systolic function is normal. LA is mildly to moderately dilated. RA is mildly dilated. Mild MR/TR. Trace AR.   Nuclear stress test on 06/03/08 showed: Normal myocardial perfusion study. No significant wall motion abnormalities. New stress is scheduled for 6/11/5.  Cardiac cath on 10/09/04 showed:  VENTRICULOGRAPHY: Ventriculography in the RAO projection revealed an ejection fraction of approximately 50%. There was posterior basilar segment hypokinesis; +2 mitral regurgitation was seen. The left ventricular end-diastolic pressure was 13.  CORONARY ARTERIOGRAPHY: On fluoroscopy, dense calcification was seen in the proximal portion of the LAD and in the left main.  1. Left main normal; it bifurcated.  2. LAD: The LAD extended down and crossed the apex of the heart. It gave rise to a large left-to-right collateral bed. The proximal LAD had minor irregularities of less than 40%. The ostium of the first diagonal had minor narrowing. The remainder showed no significant disease.  3. Circumflex: The circumflex gave rise to 1 large OM vessel and the OM vessel itself was free of disease. Proximal to the  OM vessel was a focal area of narrowing that was less than 50%.  4. Right coronary artery: On fluoroscopy, a stent was noted in what would be the midportion of the RCA. The vessel, however, was occluded 100% proximally. There were left-to-right collaterals filling the entire posterior descending artery.   CXR on 04/03/13 showed:  1. Pleural parenchymal thickening particularly in the left lung base. These findings are most consistent scarring. Mild pneumonia left lower lobe cannot be excluded. Similar findings noted on prior study from 10/29/11.  2. Stable calcified pulmonary nodules right lung base consistent with old granulomas.   Preoperative labs notes. He is for a repeat PT/PTT on the day of surgery.     Plans to proceed with depend on nuclear stress test results scheduled for 06/21/13.  [facility] Short Stay Center/Anesthesiology Phone [phone] 06/19/2013 11:03 AM  Addendum: 06/21/2013 6:53 PM Nuclear stress test today showed: Overall Impression: Low risk stress nuclear study without reversible ischemia - mild inferior bowel artifact. LV Wall Motion: Non-gated study.

## 2013-06-21 ENCOUNTER — Encounter: Payer: Self-pay | Admitting: Pharmacist Clinician (PhC)/ Clinical Pharmacy Specialist

## 2013-06-21 ENCOUNTER — Ambulatory Visit (HOSPITAL_COMMUNITY)
Admission: RE | Admit: 2013-06-21 | Discharge: 2013-06-21 | Disposition: A | Discharge status: Home / Self Care | Payer: Medicare Other | Source: Ambulatory Visit | Attending: Cardiology | Admitting: Cardiology

## 2013-06-21 ENCOUNTER — Other Ambulatory Visit: Payer: Self-pay | Admitting: Pharmacist Clinician (PhC)/ Clinical Pharmacy Specialist

## 2013-06-21 ENCOUNTER — Ambulatory Visit (INDEPENDENT_AMBULATORY_CARE_PROVIDER_SITE_OTHER): Payer: Medicare Other | Admitting: *Deleted

## 2013-06-21 DIAGNOSIS — R011 Cardiac murmur, unspecified: Secondary | ICD-10-CM | POA: Insufficient documentation

## 2013-06-21 DIAGNOSIS — I25810 Atherosclerosis of coronary artery bypass graft(s) without angina pectoris: Secondary | ICD-10-CM

## 2013-06-21 DIAGNOSIS — R0609 Other forms of dyspnea: Secondary | ICD-10-CM | POA: Insufficient documentation

## 2013-06-21 DIAGNOSIS — I10 Essential (primary) hypertension: Secondary | ICD-10-CM | POA: Insufficient documentation

## 2013-06-21 DIAGNOSIS — R0989 Other specified symptoms and signs involving the circulatory and respiratory systems: Principal | ICD-10-CM | POA: Insufficient documentation

## 2013-06-21 DIAGNOSIS — I2510 Atherosclerotic heart disease of native coronary artery without angina pectoris: Secondary | ICD-10-CM | POA: Insufficient documentation

## 2013-06-21 DIAGNOSIS — Z7901 Long term (current) use of anticoagulants: Secondary | ICD-10-CM

## 2013-06-21 DIAGNOSIS — Z8249 Family history of ischemic heart disease and other diseases of the circulatory system: Secondary | ICD-10-CM | POA: Insufficient documentation

## 2013-06-21 DIAGNOSIS — I4891 Unspecified atrial fibrillation: Secondary | ICD-10-CM

## 2013-06-21 DIAGNOSIS — Z87891 Personal history of nicotine dependence: Secondary | ICD-10-CM | POA: Insufficient documentation

## 2013-06-21 DIAGNOSIS — I482 Chronic atrial fibrillation, unspecified: Secondary | ICD-10-CM

## 2013-06-21 DIAGNOSIS — Z9861 Coronary angioplasty status: Secondary | ICD-10-CM | POA: Insufficient documentation

## 2013-06-21 DIAGNOSIS — I739 Peripheral vascular disease, unspecified: Secondary | ICD-10-CM | POA: Insufficient documentation

## 2013-06-21 LAB — POCT INR: INR: 2.9

## 2013-06-21 MED ORDER — TECHNETIUM TC 99M SESTAMIBI GENERIC - CARDIOLITE
30.0000 | Freq: Once | INTRAVENOUS | Status: AC | PRN
  Administered 2013-06-21: 30 via INTRAVENOUS

## 2013-06-21 MED ORDER — REGADENOSON 0.4 MG/5ML IV SOLN
0.4000 mg | Freq: Once | INTRAVENOUS | Status: AC
  Administered 2013-06-21: 0.4 mg via INTRAVENOUS

## 2013-06-21 MED ORDER — TECHNETIUM TC 99M SESTAMIBI GENERIC - CARDIOLITE
10.0000 | Freq: Once | INTRAVENOUS | Status: AC | PRN
  Administered 2013-06-21: 10 via INTRAVENOUS

## 2013-06-21 MED ORDER — AMINOPHYLLINE 25 MG/ML IV SOLN
75.0000 mg | Freq: Once | INTRAVENOUS | Status: AC
  Administered 2013-06-21: 75 mg via INTRAVENOUS

## 2013-06-21 NOTE — Procedures (Addendum)
[facility] [address] [place] [address] [phone]  Cardiology Nuclear Med Study  Greg Booker is a 80 y.o. male     MRN : [id number]     DOB: 03-23-1929  Procedure Date: 06/21/2013  Nuclear Med Background Indication for Stress Test:  Surgical Clearance History:  CAD;STENT/PTCA-1997;PAF;murmur;Last NUC MPI on 06/03/2008 Cardiac Risk Factors: Family History - CAD, History of Smoking, Hypertension, Lipids, Overweight and PVD  Symptoms:  DOE   Nuclear Pre-Procedure Caffeine/Decaff Intake:  7:00pm NPO After: 5:00am   IV Site: R Hand  IV 0.9% NS with Angio Cath:  22g  Chest Size (in):  48"  IV Started by: Rolene Course, RN  Height: 5\' 8"  (1.727 m)  Cup Size: n/a  BMI:  Body mass index is 27.53 kg/(m^2). Weight:  181 lb (82.101 kg)   Tech Comments:  n/a    Nuclear Med Study 1 or 2 day study: 1 day  Stress Test Type:  [place] Provider:  Lyman Bishop, MD   Resting Radionuclide: Technetium 48m Sestamibi  Resting Radionuclide Dose: 10.5 mCi   Stress Radionuclide:  Technetium 45m Sestamibi  Stress Radionuclide Dose: 30.9 mCi           Stress Protocol Rest HR: 53 Stress HR:64  Rest BP: 145/74 Stress BP: 151/70  Exercise Time (min): n/a METS: n/a          Dose of Adenosine (mg):  n/a Dose of Lexiscan: 0.4 mg  Dose of Atropine (mg): n/a Dose of Dobutamine: n/a mcg/kg/min (at max HR)  Stress Test Technologist: Mellody Memos, CCT Nuclear Technologist: Otho Perl, CNMT   Rest Procedure:  Myocardial perfusion imaging was performed at rest 45 minutes following the intravenous administration of Technetium 74m Sestamibi. Stress Procedure:  The patient received IV Lexiscan 0.4 mg over 15-seconds.  Technetium 8m Sestamibi injected IV at 30-seconds.  Patient experienced shortness of breath, dizziness and was administered 75 mg of Aminophylline IV at 5 minutes. There were no significant changes with  Lexiscan.  Quantitative spect images were obtained after a 45 minute delay.  Transient Ischemic Dilatation (Normal <1.22):  1.04 QGS EDV:  n/a ml QGS ESV:  n/a ml LV Ejection Fraction: Study not gated  Rest ECG: NSR with non-specific ST-T wave changes  Stress ECG: No significant change from baseline ECG  QPS Raw Data Images:  Normal; no motion artifact; normal heart/lung ratio. Stress Images:  There is decreased uptake in the inferior wall. Rest Images:  There is decreased uptake in the inferior wall. Subtraction (SDS):  There is a fixed inferior defect that is most consistent with diaphragmatic attenuation.  Impression Exercise Capacity:  Lexiscan with no exercise. BP Response:  Hypotensive blood pressure response. Clinical Symptoms:  Dizziness ECG Impression:  No significant ECG changes with Lexiscan. Comparison with Prior Nuclear Study: No images to compare  Overall Impression:  Low risk stress nuclear study without reversible ischemia - mild inferior bowel artifact.  LV Wall Motion:  Non-gated study  Pixie Casino, MD, [facility] Board Certified in Nuclear Cardiology Attending Cardiologist [place], [place]  06/21/2013 6:26 PM

## 2013-06-22 ENCOUNTER — Telehealth: Payer: Self-pay | Admitting: Internal Medicine

## 2013-06-22 NOTE — Telephone Encounter (Signed)
Pt. Having total hip on Tuesday, had stress test yesterday and needs clearance.  Please advise

## 2013-06-22 NOTE — Telephone Encounter (Signed)
Has surgery Tues for total hip replacement . Needs surgical clearance.  Stress test done yesterday.  Please call

## 2013-06-22 NOTE — Progress Notes (Signed)
Notified Wendy Poet, PA of no orders in epic.

## 2013-06-22 NOTE — H&P (Signed)
CHIEF COMPLAINT:  Painful right hip.    HISTORY OF PRESENT ILLNESS:  [name] is a very pleasant 80 year old white male who is seen today for evaluation of his right hip.  He had started developing pain in his right hip and groin earlier this year.  He had already been diagnosed with osteoarthritis of his hip and an interarticular injection previously was quite beneficial.  He also had a series of viscosupplementation to his right knee, which was beneficial, however, then he started having this pain in his groin and into the anterior thigh.  He asked if we could possibly do a cortisone injection by Dr. Ernestina Booker like he had previously and that was accomplished on May 18th.  He did have a little bit of relief, but it actually worsened over time.  He has been seen and an x-ray performed, which reveals advanced avascular necrosis with femoral head collapse.  He is seen today for re-evaluation.     PAST MEDICAL HISTORY:  In general his health is fair.     PAST SURGICAL HISTORY/HOSPITALIZATION:   1.  Lumbar laminectomy at L3-4 in 2010.   2.  Excision of benign breast mass on the left in 2010.  3.  Cardioversion in 2007, which was unsuccessful. 4.  Radioactive prostate seed implant in 2006.  5.  Cryoablation of his prostate in 2013.  6.  Coronary angiography and angioplasty with stent placement in 1997 of the right coronary artery.  7.  Total hip replacement on the left in 2013.   8.  Lumbar laminectomy with decompressive microdiscectomy at L3-4 in 2015.     CURRENT MEDICATIONS:  1.  Tylenol 1000 mg at bedtime p.r.n.  2.  Vitamin D 1000 units daily. 3.  Voltaren Gel t.i.d. to the knee.  4.  Digoxin 0.125 mg daily. 5.  Cardizem CD 240 mg daily. 6.  Lasix 40 mg daily.  7.  Amaryl 1 mg daily.  8.  Hydrocodone 5/325 at 1 tablet t.i.d. p.r.n. pain. 9.  Imdur 60 mg 24 hour tablet daily.  10.  Lisinopril 10 mg at bedtime.  11.  Toprol XL 50 mg 24 hour tablet daily.  12.  Niacin 500 mg CR at  bedtime. 13.  Prilosec 20 mg daily.  14.  Ditropan 5 mg daily.  15.  Flomax 0.4 mg after dinner.  16.  Ascorbic acid 500 mg at bedtime.  17.  Coumadin 5 mg tablets.  He takes 2.5 to 5 mg at bedtime as per cardiology.  18.  Lovenox 0.8 mL, which will be started on Friday.    ALLERGIES:  Bicalutamide, lovastatin, niacin, pravastatin and Codeine.     REVIEW OF SYSTEMS:  A 14-point review of systems is otherwise negative except for cataracts bilaterally with lens implants.  He does wear glasses and has decreased hearing.  He does occasionally have dyspnea on exertion when he is climbing stairs.  He has had a history of pneumonia in the past.  He does have coronary artery disease and has had a stent.  He has hypertension, which is apparently labile and is controlled with medications.  He does have a history of atrial fibrillation and he is on Coumadin for the last 18-20 years.  He has had blood clots also.  He has occasional constipation.  He has had cancer of the prostate with seeds, freezing and finally it is apparently no longer present.  He has occasional ankle swelling.  He certainly has some difficulty with starting and stopping  his urine.  He has had a sleep apnea test that states that he does not have sleep apnea.     FAMILY HISTORY:  He has a mother who died at age 27 from "old age."  His father died at 52 from leukemia and had heart disease and gout.  He had one brother die at age 86 from cancer and heart disease.  He has one that remains living at 57.  He has a sister who passed at age 50 from breast cancer.  He has one who remains alive at 37.     SOCIAL HISTORY:  He is an 80 year old white male, retired, who quit smoking cigarettes in 1975.  Prior to that he smoked 1 pack of cigarettes per day for 30 years.  He does not drink.     PHYSICAL EXAMINATION:   His height is 67.5 inches and weight 180 pounds, BMI 27.8.   Vital signs:  Temperature 98.8, pulse 72, respiration rate 12, blood  pressure 122/70.  Head:  Normocephalic.  Eyes:  Pupils equal and reactive to light and accommodation.  Extraocular movements are intact.  Ears, nose and throat:  Benign. Neck:  Supple and no bruits are noted.  Chest:  Had good expansion.  Lungs:  Decreased breath sounds, but were clear.  Cardiac:  Irregular heart rate with a grade 2/6 systolic murmur.  Pulses were trace in the posterior tibs bilaterally.   Abdomen:  Scaphoid  soft and nontender.  No masses palpable.  Normal bowel sounds are present.  Genital, rectal and breast exam are not indicated for an orthopedic evaluation.    CNS:  Oriented x3 and cranial nerves II through XII grossly intact.  Musculoskeletal:  He has very little range of motion of his hip at this time secondary to pain.  Negative straight leg raising.  He has to keep the hip flexed when he is in a laying position.  He is neurovascularly intact distally.     CLINICAL IMPRESSION:   1.  Avascular necrosis of the right hip.  2.  Status post left total hip arthroplasty for AVN.  3.  History of prostate cancer.  4.  History of atrial fibrillation.  5.  Coronary artery disease with stent.  6.  Hypertension.   RECOMMENDATIONS:  At this time I have reviewed his chart.  He has a stress test, which is coming in the next several days.  I have reviewed a clearance form from Dr. Karlton Booker and he feels that he is medically cleared.    At this time our plan is to schedule and proceed with a total hip replacement on the right.     The procedure, risks and benefits were fully explained to the patient and he is understanding.  He had all questions answered.  We went over his hospital stay and he would like to proceed with going to [facility] in [place] as needed.     Greg Booker, [place] [phone]  06/22/2013 1:06 PM

## 2013-06-25 MED ORDER — CEFAZOLIN SODIUM-DEXTROSE 2-3 GM-% IV SOLR
2.0000 g | INTRAVENOUS | Status: AC
  Administered 2013-06-26: 2 g via INTRAVENOUS
  Filled 2013-06-25: qty 50

## 2013-06-25 MED ORDER — CHLORHEXIDINE GLUCONATE 4 % EX LIQD
60.0000 mL | Freq: Once | CUTANEOUS | Status: DC
  Filled 2013-06-25: qty 60

## 2013-06-25 NOTE — Telephone Encounter (Signed)
Dr. Glenetta Hew reviewed patient's medical history/recent nuclear stress test from 6/11 and last OV note  Patient is cleared for total hip arthroplasty - low risk for moderate risk surgery.  Lovenox bridge and should restart coumadin post-op.   Patient was informed to have [facility] or rehab contact [place] with INR results/questions.   RN faxed letter of surgical clearance to [phone]

## 2013-06-25 NOTE — Telephone Encounter (Signed)
Called Greg Booker to confirm that she had received clearance. She asked that clearance be sent to her direct fax [phone]  Patient notified of stress test results

## 2013-06-26 ENCOUNTER — Encounter (HOSPITAL_COMMUNITY): Payer: Self-pay | Admitting: Anesthesiology

## 2013-06-26 ENCOUNTER — Inpatient Hospital Stay (HOSPITAL_COMMUNITY): Payer: Medicare Other

## 2013-06-26 ENCOUNTER — Inpatient Hospital Stay (HOSPITAL_COMMUNITY): Payer: Medicare Other | Admitting: Anesthesiology

## 2013-06-26 ENCOUNTER — Inpatient Hospital Stay (HOSPITAL_COMMUNITY)
Admission: RE | Admit: 2013-06-26 | Discharge: 2013-06-28 | DRG: 470 | Disposition: A | Discharge status: Skilled Nursing Facility | Payer: Medicare Other | Source: Ambulatory Visit | Attending: Orthopaedic Surgery | Admitting: Orthopaedic Surgery

## 2013-06-26 ENCOUNTER — Encounter (HOSPITAL_COMMUNITY): Payer: Medicare Other | Admitting: Vascular Surgery

## 2013-06-26 ENCOUNTER — Encounter (HOSPITAL_COMMUNITY)
Admission: RE | Disposition: A | Discharge status: Skilled Nursing Facility | Payer: Self-pay | Source: Ambulatory Visit | Attending: Orthopaedic Surgery

## 2013-06-26 DIAGNOSIS — I739 Peripheral vascular disease, unspecified: Secondary | ICD-10-CM | POA: Diagnosis present

## 2013-06-26 DIAGNOSIS — Z96649 Presence of unspecified artificial hip joint: Secondary | ICD-10-CM

## 2013-06-26 DIAGNOSIS — K219 Gastro-esophageal reflux disease without esophagitis: Secondary | ICD-10-CM | POA: Diagnosis present

## 2013-06-26 DIAGNOSIS — M87051 Idiopathic aseptic necrosis of right femur: Secondary | ICD-10-CM

## 2013-06-26 DIAGNOSIS — I2510 Atherosclerotic heart disease of native coronary artery without angina pectoris: Secondary | ICD-10-CM | POA: Diagnosis present

## 2013-06-26 DIAGNOSIS — Z923 Personal history of irradiation: Secondary | ICD-10-CM

## 2013-06-26 DIAGNOSIS — M1610 Unilateral primary osteoarthritis, unspecified hip: Secondary | ICD-10-CM | POA: Diagnosis present

## 2013-06-26 DIAGNOSIS — E785 Hyperlipidemia, unspecified: Secondary | ICD-10-CM | POA: Diagnosis present

## 2013-06-26 DIAGNOSIS — E119 Type 2 diabetes mellitus without complications: Secondary | ICD-10-CM | POA: Diagnosis present

## 2013-06-26 DIAGNOSIS — Z9861 Coronary angioplasty status: Secondary | ICD-10-CM

## 2013-06-26 DIAGNOSIS — D62 Acute posthemorrhagic anemia: Secondary | ICD-10-CM | POA: Diagnosis not present

## 2013-06-26 DIAGNOSIS — M169 Osteoarthritis of hip, unspecified: Secondary | ICD-10-CM | POA: Diagnosis present

## 2013-06-26 DIAGNOSIS — N289 Disorder of kidney and ureter, unspecified: Secondary | ICD-10-CM | POA: Diagnosis present

## 2013-06-26 DIAGNOSIS — G8929 Other chronic pain: Secondary | ICD-10-CM | POA: Diagnosis present

## 2013-06-26 DIAGNOSIS — I10 Essential (primary) hypertension: Secondary | ICD-10-CM | POA: Diagnosis present

## 2013-06-26 DIAGNOSIS — M545 Low back pain, unspecified: Secondary | ICD-10-CM | POA: Diagnosis present

## 2013-06-26 DIAGNOSIS — Z87891 Personal history of nicotine dependence: Secondary | ICD-10-CM

## 2013-06-26 DIAGNOSIS — I4891 Unspecified atrial fibrillation: Secondary | ICD-10-CM | POA: Diagnosis present

## 2013-06-26 DIAGNOSIS — M87059 Idiopathic aseptic necrosis of unspecified femur: Principal | ICD-10-CM | POA: Diagnosis present

## 2013-06-26 DIAGNOSIS — Z8546 Personal history of malignant neoplasm of prostate: Secondary | ICD-10-CM

## 2013-06-26 DIAGNOSIS — I059 Rheumatic mitral valve disease, unspecified: Secondary | ICD-10-CM | POA: Diagnosis present

## 2013-06-26 HISTORY — PX: TOTAL HIP ARTHROPLASTY: SHX124

## 2013-06-26 LAB — GLUCOSE, CAPILLARY
GLUCOSE-CAPILLARY: 130 mg/dL — AB (ref 70–99)
GLUCOSE-CAPILLARY: 267 mg/dL — AB (ref 70–99)
GLUCOSE-CAPILLARY: 178 mg/dL — AB (ref 70–99)
Glucose-Capillary: 179 mg/dL — ABNORMAL HIGH (ref 70–99)

## 2013-06-26 LAB — CBC
HCT: 32.5 % — ABNORMAL LOW (ref 39.0–52.0)
HEMOGLOBIN: 11.3 g/dL — AB (ref 13.0–17.0)
MCH: 32.7 pg (ref 26.0–34.0)
MCHC: 34.8 g/dL (ref 30.0–36.0)
MCV: 93.9 fL (ref 78.0–100.0)
Platelets: 242 10*3/uL (ref 150–400)
RBC: 3.46 MIL/uL — ABNORMAL LOW (ref 4.22–5.81)
RDW: 14 % (ref 11.5–15.5)
WBC: 11.4 10*3/uL — ABNORMAL HIGH (ref 4.0–10.5)

## 2013-06-26 LAB — CREATININE, SERUM
CREATININE: 1.26 mg/dL (ref 0.50–1.35)
GFR calc Af Amer: 60 mL/min — ABNORMAL LOW (ref >90)
GFR calc non Af Amer: 51 mL/min — ABNORMAL LOW (ref >90)

## 2013-06-26 LAB — APTT: aPTT: 35 seconds (ref 24–37)

## 2013-06-26 LAB — HEMOGLOBIN A1C
Hgb A1c MFr Bld: 6 % — ABNORMAL HIGH (ref <5.7)
Mean Plasma Glucose: 126 mg/dL — ABNORMAL HIGH (ref <117)

## 2013-06-26 LAB — PROTIME-INR
INR: 1.16 (ref 0.00–1.49)
PROTHROMBIN TIME: 14.6 s (ref 11.6–15.2)

## 2013-06-26 SURGERY — ARTHROPLASTY, HIP, TOTAL,POSTERIOR APPROACH
Anesthesia: General | Site: Hip | Laterality: Right

## 2013-06-26 MED ORDER — SODIUM CHLORIDE 0.9 % IR SOLN
Status: DC | PRN
  Administered 2013-06-26: 2000 mL

## 2013-06-26 MED ORDER — METHOCARBAMOL 1000 MG/10ML IJ SOLN
500.0000 mg | Freq: Four times a day (QID) | INTRAMUSCULAR | Status: DC | PRN
  Filled 2013-06-26: qty 5

## 2013-06-26 MED ORDER — KETOROLAC TROMETHAMINE 15 MG/ML IJ SOLN
7.5000 mg | Freq: Four times a day (QID) | INTRAMUSCULAR | Status: AC
  Administered 2013-06-26 – 2013-06-27 (×3): 7.5 mg via INTRAVENOUS
  Filled 2013-06-26: qty 1

## 2013-06-26 MED ORDER — PROPOFOL 10 MG/ML IV BOLUS
INTRAVENOUS | Status: AC
  Filled 2013-06-26: qty 20

## 2013-06-26 MED ORDER — ISOSORBIDE MONONITRATE ER 60 MG PO TB24
60.0000 mg | ORAL_TABLET | Freq: Every day | ORAL | Status: DC
  Administered 2013-06-27 – 2013-06-28 (×2): 60 mg via ORAL
  Filled 2013-06-26 (×2): qty 1

## 2013-06-26 MED ORDER — HYDROCODONE-ACETAMINOPHEN 5-325 MG PO TABS
1.0000 | ORAL_TABLET | ORAL | Status: DC | PRN
  Administered 2013-06-27: 1 via ORAL
  Administered 2013-06-28: 2 via ORAL
  Administered 2013-06-28: 1 via ORAL
  Filled 2013-06-26 (×2): qty 1
  Filled 2013-06-26: qty 2

## 2013-06-26 MED ORDER — DEXAMETHASONE SODIUM PHOSPHATE 4 MG/ML IJ SOLN
INTRAMUSCULAR | Status: DC | PRN
  Administered 2013-06-26: 4 mg via INTRAVENOUS

## 2013-06-26 MED ORDER — METOCLOPRAMIDE HCL 10 MG PO TABS: 5.0000 mg | ORAL_TABLET | Freq: Three times a day (TID) | ORAL | Status: DC | PRN

## 2013-06-26 MED ORDER — FUROSEMIDE 40 MG PO TABS
40.0000 mg | ORAL_TABLET | Freq: Every day | ORAL | Status: DC
  Administered 2013-06-27: 40 mg via ORAL
  Filled 2013-06-26 (×3): qty 1

## 2013-06-26 MED ORDER — ONDANSETRON HCL 4 MG/2ML IJ SOLN
INTRAMUSCULAR | Status: AC
  Filled 2013-06-26: qty 2

## 2013-06-26 MED ORDER — HYDROMORPHONE HCL PF 1 MG/ML IJ SOLN
0.5000 mg | INTRAMUSCULAR | Status: DC | PRN
  Administered 2013-06-26 (×2): via INTRAVENOUS
  Administered 2013-06-26: 0.5 mg via INTRAVENOUS
  Filled 2013-06-26 (×2): qty 1

## 2013-06-26 MED ORDER — NEOSTIGMINE METHYLSULFATE 10 MG/10ML IV SOLN
INTRAVENOUS | Status: DC | PRN
  Administered 2013-06-26: 3 mg via INTRAVENOUS

## 2013-06-26 MED ORDER — WARFARIN SODIUM 5 MG PO TABS
5.0000 mg | ORAL_TABLET | Freq: Once | ORAL | Status: AC
  Administered 2013-06-26: 5 mg via ORAL
  Filled 2013-06-26: qty 1

## 2013-06-26 MED ORDER — GLIMEPIRIDE 1 MG PO TABS
1.0000 mg | ORAL_TABLET | Freq: Every day | ORAL | Status: DC
  Administered 2013-06-26 – 2013-06-27 (×2): 1 mg via ORAL
  Filled 2013-06-26 (×3): qty 1

## 2013-06-26 MED ORDER — METOPROLOL SUCCINATE ER 50 MG PO TB24
50.0000 mg | ORAL_TABLET | Freq: Every day | ORAL | Status: DC
  Administered 2013-06-27 – 2013-06-28 (×2): 50 mg via ORAL
  Filled 2013-06-26 (×2): qty 1

## 2013-06-26 MED ORDER — INSULIN ASPART 100 UNIT/ML ~~LOC~~ SOLN
0.0000 [IU] | Freq: Three times a day (TID) | SUBCUTANEOUS | Status: DC
  Administered 2013-06-26: 8 [IU] via SUBCUTANEOUS
  Administered 2013-06-27 (×3): 2 [IU] via SUBCUTANEOUS

## 2013-06-26 MED ORDER — DOCUSATE SODIUM 100 MG PO CAPS
100.0000 mg | ORAL_CAPSULE | Freq: Two times a day (BID) | ORAL | Status: DC
  Administered 2013-06-26 – 2013-06-28 (×5): 100 mg via ORAL
  Filled 2013-06-26 (×5): qty 1

## 2013-06-26 MED ORDER — WARFARIN - PHARMACIST DOSING INPATIENT: Freq: Every day | Status: DC

## 2013-06-26 MED ORDER — FLEET ENEMA 7-19 GM/118ML RE ENEM: 1.0000 | ENEMA | Freq: Once | RECTAL | Status: AC | PRN

## 2013-06-26 MED ORDER — ACETAMINOPHEN 650 MG RE SUPP: 650.0000 mg | Freq: Four times a day (QID) | RECTAL | Status: DC | PRN

## 2013-06-26 MED ORDER — MAGNESIUM HYDROXIDE 400 MG/5ML PO SUSP: 30.0000 mL | Freq: Every day | ORAL | Status: DC | PRN

## 2013-06-26 MED ORDER — ROCURONIUM BROMIDE 100 MG/10ML IV SOLN
INTRAVENOUS | Status: DC | PRN
  Administered 2013-06-26: 50 mg via INTRAVENOUS

## 2013-06-26 MED ORDER — DIGOXIN 125 MCG PO TABS
0.1250 mg | ORAL_TABLET | Freq: Every day | ORAL | Status: DC
  Administered 2013-06-26 – 2013-06-27 (×2): 0.125 mg via ORAL
  Filled 2013-06-26 (×3): qty 1

## 2013-06-26 MED ORDER — ONDANSETRON HCL 4 MG PO TABS: 4.0000 mg | ORAL_TABLET | Freq: Four times a day (QID) | ORAL | Status: DC | PRN

## 2013-06-26 MED ORDER — PHENOL 1.4 % MT LIQD: 1.0000 | OROMUCOSAL | Status: DC | PRN

## 2013-06-26 MED ORDER — FENTANYL CITRATE 0.05 MG/ML IJ SOLN
INTRAMUSCULAR | Status: DC | PRN
  Administered 2013-06-26 (×2): 50 ug via INTRAVENOUS
  Administered 2013-06-26: 100 ug via INTRAVENOUS
  Administered 2013-06-26: 50 ug via INTRAVENOUS

## 2013-06-26 MED ORDER — DILTIAZEM HCL ER COATED BEADS 240 MG PO CP24
240.0000 mg | ORAL_CAPSULE | Freq: Every day | ORAL | Status: DC
  Administered 2013-06-27 – 2013-06-28 (×2): 240 mg via ORAL
  Filled 2013-06-26 (×2): qty 1

## 2013-06-26 MED ORDER — LISINOPRIL 10 MG PO TABS
10.0000 mg | ORAL_TABLET | Freq: Every day | ORAL | Status: DC
  Administered 2013-06-27: 10 mg via ORAL
  Filled 2013-06-26 (×2): qty 1

## 2013-06-26 MED ORDER — MENTHOL 3 MG MT LOZG: 1.0000 | LOZENGE | OROMUCOSAL | Status: DC | PRN

## 2013-06-26 MED ORDER — EPHEDRINE SULFATE 50 MG/ML IJ SOLN
INTRAMUSCULAR | Status: DC | PRN
  Administered 2013-06-26 (×2): 10 mg via INTRAVENOUS

## 2013-06-26 MED ORDER — INSULIN ASPART 100 UNIT/ML ~~LOC~~ SOLN: 0.0000 [IU] | Freq: Every day | SUBCUTANEOUS | Status: DC

## 2013-06-26 MED ORDER — PROPOFOL 10 MG/ML IV BOLUS
INTRAVENOUS | Status: DC | PRN
  Administered 2013-06-26: 150 mg via INTRAVENOUS
  Administered 2013-06-26: 50 mg via INTRAVENOUS

## 2013-06-26 MED ORDER — LIDOCAINE HCL (CARDIAC) 20 MG/ML IV SOLN
INTRAVENOUS | Status: DC | PRN
  Administered 2013-06-26: 50 mg via INTRAVENOUS

## 2013-06-26 MED ORDER — HYDROMORPHONE HCL PF 1 MG/ML IJ SOLN
0.2500 mg | INTRAMUSCULAR | Status: DC | PRN
  Administered 2013-06-26 (×4): 0.5 mg via INTRAVENOUS

## 2013-06-26 MED ORDER — SODIUM CHLORIDE 0.9 % IV SOLN: INTRAVENOUS | Status: DC

## 2013-06-26 MED ORDER — ACETAMINOPHEN 10 MG/ML IV SOLN
1000.0000 mg | Freq: Four times a day (QID) | INTRAVENOUS | Status: DC
  Administered 2013-06-26: 1000 mg via INTRAVENOUS
  Filled 2013-06-26: qty 100

## 2013-06-26 MED ORDER — OXYBUTYNIN CHLORIDE 5 MG PO TABS
5.0000 mg | ORAL_TABLET | Freq: Every day | ORAL | Status: DC
  Administered 2013-06-27 – 2013-06-28 (×2): 5 mg via ORAL
  Filled 2013-06-26 (×2): qty 1

## 2013-06-26 MED ORDER — LIDOCAINE HCL (CARDIAC) 20 MG/ML IV SOLN
INTRAVENOUS | Status: AC
  Filled 2013-06-26: qty 5

## 2013-06-26 MED ORDER — ACETAMINOPHEN 10 MG/ML IV SOLN
1000.0000 mg | Freq: Four times a day (QID) | INTRAVENOUS | Status: AC
  Administered 2013-06-26 – 2013-06-27 (×4): 1000 mg via INTRAVENOUS
  Filled 2013-06-26 (×4): qty 100

## 2013-06-26 MED ORDER — ACETAMINOPHEN 325 MG PO TABS: 650.0000 mg | ORAL_TABLET | Freq: Four times a day (QID) | ORAL | Status: DC | PRN

## 2013-06-26 MED ORDER — PANTOPRAZOLE SODIUM 40 MG PO TBEC
40.0000 mg | DELAYED_RELEASE_TABLET | Freq: Every day | ORAL | Status: DC
  Administered 2013-06-27 – 2013-06-28 (×2): 40 mg via ORAL
  Filled 2013-06-26 (×2): qty 1

## 2013-06-26 MED ORDER — OXYCODONE HCL 5 MG/5ML PO SOLN: 5.0000 mg | Freq: Once | ORAL | Status: AC | PRN

## 2013-06-26 MED ORDER — MIDAZOLAM HCL 2 MG/2ML IJ SOLN
INTRAMUSCULAR | Status: AC
  Filled 2013-06-26: qty 2

## 2013-06-26 MED ORDER — METHOCARBAMOL 500 MG PO TABS
ORAL_TABLET | ORAL | Status: AC
Start: 2013-06-26 — End: 2013-06-26
  Administered 2013-06-26: 11:00:00
  Filled 2013-06-26: qty 1

## 2013-06-26 MED ORDER — ALUM & MAG HYDROXIDE-SIMETH 200-200-20 MG/5ML PO SUSP: 30.0000 mL | ORAL | Status: DC | PRN

## 2013-06-26 MED ORDER — OXYCODONE HCL 5 MG PO TABS
ORAL_TABLET | ORAL | Status: AC
  Administered 2013-06-26: 11:00:00
  Filled 2013-06-26: qty 1

## 2013-06-26 MED ORDER — ENOXAPARIN SODIUM 40 MG/0.4ML ~~LOC~~ SOLN
40.0000 mg | SUBCUTANEOUS | Status: DC
  Administered 2013-06-27: 40 mg via SUBCUTANEOUS
  Filled 2013-06-26 (×3): qty 0.4

## 2013-06-26 MED ORDER — HYDROMORPHONE HCL PF 1 MG/ML IJ SOLN
INTRAMUSCULAR | Status: AC
  Filled 2013-06-26: qty 1

## 2013-06-26 MED ORDER — GLYCOPYRROLATE 0.2 MG/ML IJ SOLN
INTRAMUSCULAR | Status: DC | PRN
  Administered 2013-06-26: 0.6 mg via INTRAVENOUS

## 2013-06-26 MED ORDER — SODIUM CHLORIDE 0.9 % IJ SOLN
INTRAMUSCULAR | Status: AC
  Filled 2013-06-26: qty 10

## 2013-06-26 MED ORDER — INSULIN ASPART 100 UNIT/ML ~~LOC~~ SOLN: 0.0000 [IU] | Freq: Three times a day (TID) | SUBCUTANEOUS | Status: DC

## 2013-06-26 MED ORDER — EPHEDRINE SULFATE 50 MG/ML IJ SOLN
INTRAMUSCULAR | Status: AC
  Filled 2013-06-26: qty 1

## 2013-06-26 MED ORDER — ONDANSETRON HCL 4 MG/2ML IJ SOLN
INTRAMUSCULAR | Status: DC | PRN
  Administered 2013-06-26: 4 mg via INTRAVENOUS

## 2013-06-26 MED ORDER — OXYCODONE HCL 5 MG PO TABS
5.0000 mg | ORAL_TABLET | Freq: Once | ORAL | Status: AC | PRN
  Administered 2013-06-26: 5 mg via ORAL

## 2013-06-26 MED ORDER — CHLORHEXIDINE GLUCONATE 4 % EX LIQD
60.0000 mL | Freq: Every day | CUTANEOUS | Status: DC
  Filled 2013-06-26: qty 60

## 2013-06-26 MED ORDER — BISACODYL 10 MG RE SUPP: 10.0000 mg | Freq: Every day | RECTAL | Status: DC | PRN

## 2013-06-26 MED ORDER — FENTANYL CITRATE 0.05 MG/ML IJ SOLN
INTRAMUSCULAR | Status: AC
  Filled 2013-06-26: qty 5

## 2013-06-26 MED ORDER — BUPIVACAINE-EPINEPHRINE (PF) 0.25% -1:200000 IJ SOLN
INTRAMUSCULAR | Status: AC
  Filled 2013-06-26: qty 30

## 2013-06-26 MED ORDER — SODIUM CHLORIDE 0.9 % IV SOLN
75.0000 mL/h | INTRAVENOUS | Status: DC
  Administered 2013-06-26: 75 mL/h via INTRAVENOUS

## 2013-06-26 MED ORDER — ONDANSETRON HCL 4 MG/2ML IJ SOLN: 4.0000 mg | Freq: Four times a day (QID) | INTRAMUSCULAR | Status: DC | PRN

## 2013-06-26 MED ORDER — ROCURONIUM BROMIDE 50 MG/5ML IV SOLN
INTRAVENOUS | Status: AC
  Filled 2013-06-26: qty 1

## 2013-06-26 MED ORDER — TAMSULOSIN HCL 0.4 MG PO CAPS
0.4000 mg | ORAL_CAPSULE | Freq: Every day | ORAL | Status: DC
  Administered 2013-06-26 – 2013-06-27 (×2): 0.4 mg via ORAL
  Filled 2013-06-26 (×3): qty 1

## 2013-06-26 MED ORDER — LACTATED RINGERS IV SOLN
INTRAVENOUS | Status: DC | PRN
  Administered 2013-06-26 (×2): via INTRAVENOUS

## 2013-06-26 MED ORDER — PHENYLEPHRINE 40 MCG/ML (10ML) SYRINGE FOR IV PUSH (FOR BLOOD PRESSURE SUPPORT)
PREFILLED_SYRINGE | INTRAVENOUS | Status: AC
  Filled 2013-06-26: qty 10

## 2013-06-26 MED ORDER — KETOROLAC TROMETHAMINE 15 MG/ML IJ SOLN
INTRAMUSCULAR | Status: AC
  Administered 2013-06-26: 11:00:00
  Filled 2013-06-26: qty 1

## 2013-06-26 MED ORDER — BUPIVACAINE-EPINEPHRINE (PF) 0.25% -1:200000 IJ SOLN
INTRAMUSCULAR | Status: DC | PRN
  Administered 2013-06-26: 20 mL

## 2013-06-26 MED ORDER — CEFAZOLIN SODIUM-DEXTROSE 2-3 GM-% IV SOLR
2.0000 g | Freq: Four times a day (QID) | INTRAVENOUS | Status: AC
  Administered 2013-06-26 (×2): 2 g via INTRAVENOUS
  Filled 2013-06-26 (×2): qty 50

## 2013-06-26 MED ORDER — METOCLOPRAMIDE HCL 5 MG/ML IJ SOLN
INTRAMUSCULAR | Status: DC | PRN
  Administered 2013-06-26: 10 mg via INTRAVENOUS

## 2013-06-26 MED ORDER — METOCLOPRAMIDE HCL 5 MG/ML IJ SOLN: 5.0000 mg | Freq: Three times a day (TID) | INTRAMUSCULAR | Status: DC | PRN

## 2013-06-26 MED ORDER — PHENYLEPHRINE HCL 10 MG/ML IJ SOLN
10.0000 mg | INTRAVENOUS | Status: DC | PRN
  Administered 2013-06-26: 40 ug/min via INTRAVENOUS

## 2013-06-26 MED ORDER — SUCCINYLCHOLINE CHLORIDE 20 MG/ML IJ SOLN
INTRAMUSCULAR | Status: AC
Start: 2013-06-26 — End: 2013-06-26
  Filled 2013-06-26: qty 1

## 2013-06-26 MED ORDER — METOCLOPRAMIDE HCL 5 MG/ML IJ SOLN: 10.0000 mg | Freq: Once | INTRAMUSCULAR | Status: DC | PRN

## 2013-06-26 MED ORDER — METHOCARBAMOL 500 MG PO TABS
500.0000 mg | ORAL_TABLET | Freq: Four times a day (QID) | ORAL | Status: DC | PRN
  Administered 2013-06-26 – 2013-06-28 (×3): 500 mg via ORAL
  Filled 2013-06-26 (×2): qty 1

## 2013-06-26 SURGICAL SUPPLY — 60 items
BLADE SAW SAG 73X25 THK (BLADE) ×1
BLADE SAW SGTL 73X25 THK (BLADE) ×1 IMPLANT
BRUSH FEMORAL CANAL (MISCELLANEOUS) IMPLANT
CAPT HIP PF MOP ×1 IMPLANT
COVER BACK TABLE 24X17X13 BIG (DRAPES) IMPLANT
COVER SURGICAL LIGHT HANDLE (MISCELLANEOUS) ×2 IMPLANT
DRAPE INCISE IOBAN 66X45 STRL (DRAPES) IMPLANT
DRAPE ORTHO SPLIT 77X108 STRL (DRAPES) ×6
DRAPE SURG ORHT 6 SPLT 77X108 (DRAPES) ×2 IMPLANT
DRSG MEPILEX BORDER 4X12 (GAUZE/BANDAGES/DRESSINGS) ×1 IMPLANT
DRSG MEPILEX BORDER 4X8 (GAUZE/BANDAGES/DRESSINGS) ×1 IMPLANT
DURAPREP 26ML APPLICATOR (WOUND CARE) ×2 IMPLANT
ELECT BLADE 4.0 EZ CLEAN MEGAD (MISCELLANEOUS) ×2
ELECT BLADE 6.5 EXT (BLADE) IMPLANT
ELECT REM PT RETURN 9FT ADLT (ELECTROSURGICAL) ×2
ELECTRODE BLDE 4.0 EZ CLN MEGD (MISCELLANEOUS) IMPLANT
ELECTRODE REM PT RTRN 9FT ADLT (ELECTROSURGICAL) ×1 IMPLANT
EVACUATOR 1/8 PVC DRAIN (DRAIN) IMPLANT
FACESHIELD WRAPAROUND (MASK) ×4 IMPLANT
FACESHIELD WRAPAROUND OR TEAM (MASK) ×3 IMPLANT
GLOVE BIOGEL PI IND STRL 8 (GLOVE) ×2 IMPLANT
GLOVE BIOGEL PI IND STRL 8.5 (GLOVE) ×1 IMPLANT
GLOVE BIOGEL PI INDICATOR 8 (GLOVE) ×2
GLOVE BIOGEL PI INDICATOR 8.5 (GLOVE) ×1
GLOVE ECLIPSE 8.0 STRL XLNG CF (GLOVE) ×4 IMPLANT
GLOVE SURG ORTHO 8.5 STRL (GLOVE) ×4 IMPLANT
GOWN STRL REUS W/ TWL LRG LVL3 (GOWN DISPOSABLE) ×2 IMPLANT
GOWN STRL REUS W/TWL 2XL LVL3 (GOWN DISPOSABLE) ×4 IMPLANT
GOWN STRL REUS W/TWL LRG LVL3 (GOWN DISPOSABLE) ×4
HANDPIECE INTERPULSE COAX TIP (DISPOSABLE)
IMMOBILIZER KNEE 20 (SOFTGOODS) IMPLANT
IMMOBILIZER KNEE 22 UNIV (SOFTGOODS) ×1 IMPLANT
IMMOBILIZER KNEE 24 THIGH 36 (MISCELLANEOUS) IMPLANT
IMMOBILIZER KNEE 24 UNIV (MISCELLANEOUS)
KIT BASIN OR (CUSTOM PROCEDURE TRAY) ×2 IMPLANT
KIT ROOM TURNOVER OR (KITS) ×2 IMPLANT
MANIFOLD NEPTUNE II (INSTRUMENTS) ×2 IMPLANT
NEEDLE 22X1 1/2 (OR ONLY) (NEEDLE) ×2 IMPLANT
NS IRRIG 1000ML POUR BTL (IV SOLUTION) ×2 IMPLANT
PACK TOTAL JOINT (CUSTOM PROCEDURE TRAY) ×2 IMPLANT
PAD ARMBOARD 7.5X6 YLW CONV (MISCELLANEOUS) ×4 IMPLANT
PRESSURIZER FEMORAL UNIV (MISCELLANEOUS) IMPLANT
SET HNDPC FAN SPRY TIP SCT (DISPOSABLE) IMPLANT
STAPLER VISISTAT 35W (STAPLE) ×2 IMPLANT
SUCTION FRAZIER TIP 10 FR DISP (SUCTIONS) ×2 IMPLANT
SUT BONE WAX W31G (SUTURE) IMPLANT
SUT ETHIBOND NAB CT1 #1 30IN (SUTURE) ×6 IMPLANT
SUT MNCRL AB 3-0 PS2 18 (SUTURE) ×2 IMPLANT
SUT VIC AB 0 CT1 27 (SUTURE) ×4
SUT VIC AB 0 CT1 27XBRD ANBCTR (SUTURE) ×2 IMPLANT
SUT VIC AB 1 CT1 27 (SUTURE) ×4
SUT VIC AB 1 CT1 27XBRD ANBCTR (SUTURE) ×2 IMPLANT
SUT VIC AB 2-0 CT1 27 (SUTURE) ×2
SUT VIC AB 2-0 CT1 TAPERPNT 27 (SUTURE) ×1 IMPLANT
SYR CONTROL 10ML LL (SYRINGE) ×2 IMPLANT
TOWEL OR 17X24 6PK STRL BLUE (TOWEL DISPOSABLE) ×2 IMPLANT
TOWEL OR 17X26 10 PK STRL BLUE (TOWEL DISPOSABLE) ×2 IMPLANT
TOWER CARTRIDGE SMART MIX (DISPOSABLE) IMPLANT
TRAY FOLEY CATH 16FRSI W/METER (SET/KITS/TRAYS/PACK) ×2 IMPLANT
WATER STERILE IRR 1000ML POUR (IV SOLUTION) ×8 IMPLANT

## 2013-06-26 NOTE — Anesthesia Procedure Notes (Signed)
Procedure Name: Intubation Date/Time: 06/26/2013 7:39 AM Performed by: Carney Living Pre-anesthesia Checklist: Patient identified, Emergency Drugs available, Suction available, Patient being monitored and Timeout performed Patient Re-evaluated:Patient Re-evaluated prior to inductionOxygen Delivery Method: Circle system utilized Preoxygenation: Pre-oxygenation with 100% oxygen Intubation Type: IV induction Ventilation: Mask ventilation without difficulty Laryngoscope Size: Mac and 4 Grade View: Grade I Tube type: Oral Tube size: 7.5 mm Number of attempts: 1 Airway Equipment and Method: Stylet Placement Confirmation: ETT inserted through vocal cords under direct vision,  positive ETCO2 and breath sounds checked- equal and bilateral Secured at: 23 cm Tube secured with: Tape Dental Injury: Teeth and Oropharynx as per pre-operative assessment

## 2013-06-26 NOTE — Progress Notes (Signed)
ANTICOAGULATION CONSULT NOTE - Initial Consult  Pharmacy Consult for coumadin Indication: VTE prophylaxis; afib  Allergies  Allergen Reactions  . Bicalutamide Other (See Comments)    Casodex caused increase in breast size  . Lovastatin Other (See Comments)    Leg weakness  . Niacin And Related Other (See Comments)    Burning and stinging from tablets (otc capsules ok)  . Pravastatin Other (See Comments)    myalgias  . Codeine Itching and Rash    Patient Measurements: Weight: 181 lb (82.101 kg)  Vital Signs: Temp: 97.7 F (36.5 C) (06/16 1200) Temp src: Oral (06/16 1200) BP: 115/56 mmHg (06/16 1200) Pulse Rate: 70 (06/16 1200)  Labs:  Recent Labs  06/26/13 0551 06/26/13 1215  HGB  --  11.3*  HCT  --  32.5*  PLT  --  242  APTT 35  --   LABPROT 14.6  --   INR 1.16  --     The CrCl is unknown because both a height and weight (above a minimum accepted value) are required for this calculation.   Medical History: Past Medical History  Diagnosis Date  . GERD (gastroesophageal reflux disease)   . Hyperlipidemia   . S/P primary angioplasty with coronary stent   . Calcified granuloma of lung RIGHT LUNG BASE - STABLE PER CXR 03-03-2010  . Mitral regurgitation   . Chronic back pain   . Sciatic nerve pain LEFT LEG PAIN--  EPI INJECTIONS  . Low back pain radiating to left leg   . Numbness and tingling of left leg   . Impaired hearing BILATERAL AIDS  . Nocturia   . Prostate cancer RECUR    S/P RADIATIVE SEEDS 2006  . Anticoagulated on Coumadin   . PAF (paroxysmal atrial fibrillation)   . Heart murmur   . Peripheral vascular disease     dvt lft leg 10 yrs ago  . Arthritis   . S/P radiation therapy   . Dysrhythmia   . Pneumonia 01/2013  . Diabetes mellitus without complication   . Coronary artery disease     Cardiologist Dr. Debara Pickett  . Hypertension   . Shortness of breath     with exertion  . Urgency of urination     Medications:  See med  rec  Assessment: Patient is an 80 y.o M on coumadin PTA for afib.  Coumadin has been on hold since 6/10 and patient was bridge with lovenox in anticipation of THA procedure today.  INR 1.16 today  Goal of Therapy:  INR 2-3    Plan:  1) coumadin 5mg  PO x1 today  Pham, Anh P 06/26/2013,12:56 PM

## 2013-06-26 NOTE — Op Note (Signed)
Greg Booker, Greg Booker NO.:  [id number]  MEDICAL RECORD NO.:  [id number]  LOCATION:  5N30C                        FACILITY:  [facility]  PHYSICIAN:  Vonna Kotyk. 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  PROCEDURE:  Right total hip replacement.  SURGEON:  Vonna Kotyk. Durward Fortes, MD  ASSISTANT:  Mike Craze. Petrarca, PA-C  ANESTHESIA:  General.  COMPLICATIONS:  None.  COMPONENTS:  DePuy AML, large stature 15-mm femoral stem, a 36 mm outer diameter hip ball with a +5 mm neck length, 54 mm outer diameter, Gription sector 3 metallic acetabular component, apex hole eliminator, and a pair Marathon polyethylene liner with a 10 degree posterior lip and one 6.5 mm in diameter and 30 mm long acetabular screw.  Components were secured without cement.  PROCEDURE IN DETAIL:  Mr. Bougher was met in the holding area, identified the right hip was appropriate operative site and marked it accordingly. He was then transported to room #7 and placed under general anesthesia without difficulty.  Nursing staff inserted a Foley catheter.  Urine was clear.  The patient was then placed in the lateral decubitus position with the right side up and secured to the operating table with the Innomed hip system, a time-out was called.  The right hip was then prepped with chlorhexidine scrub and then DuraPrep from the iliac crest to the foot.  Sterile draping was performed.  Routine Southern incision was utilized and via sharp dissection carried down to the subcutaneous tissue.  Gross bleeders were Bovie coagulated. Adipose tissue was incised to the depth of the iliotibial band.   Self- retaining retractors were placed more deeply.  The IT band was then incised along the length of the skin incision.  With the hip internally rotated, the short external rotators were identified and carefully incised.  Tendinous structures were tagged with 0 Ethibond suture.  At that point, the capsule was easily identified. It was incised along the femoral neck and head to the level of the acetabulum.  There was serosanguineous effusion.  At that point, the head was easily dislocated posteriorly.  It was severely deformed and delaminated.  There was lot of cartilaginous debris within the acetabulum.  Using the calcar guide, the head was osteotomized about a fingerbreadth and a half proximal to the lesser trochanter.  A hole was then made in the piriformis fossa.  Canal finer inserted and reaming was performed to 14.5 to accept a 15 prosthesis.  Rasping was then performed sequentially to a large stature 15-mm femoral component with excellent fit.  I used a calcar and reamer to obtain the appropriate angle on the calcar and actually did use the oscillating saw to remove about a 0.5 cm of bone.  The final cut was about a fingerbreadth proximal to the lesser trochanter.  Retractors were then placed about the acetabulum.  There was a very well- formed hypertrophic  labrum this was sharply excised.  Further debris was removed from the acetabulum.  Retractors were placed about the acetabulum with good visualization.  Reaming was performed sequentially to 53 mm outer diameter, and I thought that was a perfect fit.  So, I trialed a 52 and a 54 cup and 54 had excellent rim fit, but would not completely seat 54, and 52 had good rim fit, but would completely seat.  The Gription sector 3, 54 mm outer diameter acetabular component was then impacted in place, followed by the trial of marathon polyethylene liner, and then the fit the large stature of 15 mm with femoral rasp and then we tried  several neck lengths with a 36 mm outer diameter hip ball, and with the 5 mm we thought we had perfect position.  With flexion and extension, I thought the leg lengths were symmetrical.  There was no toggling and no evidence of instability.  The wound was then irrigated with saline solution.  Trial components were removed.  I inserted one single acetabular screw after appropriate drilling, measuring, and filling with a 6.5 mm outer diameter titanium screw with a 30 mm length.  The apex hole eliminator was inserted followed by the polyethylene marathon liner with 10 degree posterior lip.  The acetabulum was irrigated.  The final 15 large stature femoral component was then impacted flush on the calcar.  We cleaned the [name] taper neck and inserted the 36 mm outer diameter hip ball with a 5 mm neck length.  The entire construct was then reduced.  There was no toggling and perfect stability.  Leg lengths appeared to be symmetrical.  The wound was again irrigated with saline solution, the capsule was closed anatomically with a running 0 Ethibond.  Short external rotators were closed with similar material.  The iliotibial band was closed with a running 0 and 2-0 Vicryl and subcu with 3-0 Monocryl.  Skin closed with skin clips.  Sterile bulky dressing was applied.  The patient was then placed supine on the operating table.  Knee immobilizer was placed on the right lower extremity.  The patient was awoken, and then placed on the operating stretcher to return to the postanesthesia recovery room.  The patient tolerated the procedure well without complications.     Vonna Kotyk. Durward Fortes, M.D.     PWW/MEDQ  D:  06/26/2013  T:  06/26/2013  Job:  [id number]

## 2013-06-26 NOTE — Progress Notes (Signed)
Utilization review completed.  

## 2013-06-26 NOTE — Progress Notes (Signed)
Care of pt assumed by MA Shaver RN 

## 2013-06-26 NOTE — Transfer of Care (Signed)
Immediate Anesthesia Transfer of Care Note  Patient: Greg Booker  Procedure(s) Performed: Procedure(s): TOTAL HIP ARTHROPLASTY (Right)  Patient Location: PACU  Anesthesia Type:General  Level of Consciousness: awake, alert , oriented and patient cooperative  Airway & Oxygen Therapy: Patient Spontanous Breathing and Patient connected to nasal cannula oxygen  Post-op Assessment: Report given to PACU RN, Post -op Vital signs reviewed and stable and Patient moving all extremities X 4  Post vital signs: Reviewed and stable  Complications: No apparent anesthesia complications

## 2013-06-26 NOTE — H&P (Signed)
  The recent History & Physical has been reviewed. I have personally examined the patient today. There is no interval change to the documented History & Physical. The patient would like to proceed with the procedure.  [name] W 06/26/2013,  7:19 AM

## 2013-06-26 NOTE — Evaluation (Signed)
Physical Therapy Evaluation Patient Details Name: Greg Booker MRN: [id number] DOB: 07-21-1929 Today's Date: 06/26/2013   History of Present Illness  s/p R THA posterior approach; WBAT  Clinical Impression  Pt is s/p THA resulting in the deficits listed below (see PT Problem List).  Pt will benefit from skilled PT to increase their independence and safety with mobility to allow discharge to the venue listed below.        Follow Up Recommendations SNF    Equipment Recommendations  None recommended by PT    Recommendations for Other Services OT consult     Precautions / Restrictions Precautions Precautions: Posterior Hip Precaution Booklet Issued: Yes (comment) Precaution Comments: Educated in post prec; Posted sheet in room Restrictions RLE Weight Bearing: Weight bearing as tolerated      Mobility  Bed Mobility Overal bed mobility: Needs Assistance Bed Mobility: Supine to Sit     Supine to sit: Min assist;+2 for safety/equipment     General bed mobility comments: Cues for technique  Transfers Overall transfer level: Needs assistance Equipment used: Rolling walker (2 wheeled) Transfers: Sit to/from Stand Sit to Stand: Min assist         General transfer comment: Cues for safety, hand placement, and to pre-position for hip prec  Ambulation/Gait Ambulation/Gait assistance: Min guard Ambulation Distance (Feet): 5 Feet Assistive device: Rolling walker (2 wheeled) Gait Pattern/deviations: Step-to pattern Gait velocity: too short a distance to assess   General Gait Details: Cues for gait sequence and post prec with turns; cues also to self-monitor for activity tolerance  Stairs            Wheelchair Mobility    Modified Rankin (Stroke Patients Only)       Balance Overall balance assessment: No apparent balance deficits (not formally assessed)                                           Pertinent Vitals/Pain Minimal pain R  hip patient repositioned for comfort     Home Living Family/patient expects to be discharged to:: Skilled nursing facility Living Arrangements: Spouse/significant other   Type of Home: House Home Access: Ramped entrance     Home Layout: One level Home Equipment: Walker - 2 wheels;Bedside commode;Shower seat      Prior Function Level of Independence: Independent               Hand Dominance        Extremity/Trunk Assessment   Upper Extremity Assessment: Overall WFL for tasks assessed           Lower Extremity Assessment: RLE deficits/detail RLE Deficits / Details: Grossly decr hip AROM and strength       Communication   Communication: No difficulties  Cognition Arousal/Alertness: Awake/alert Behavior During Therapy: WFL for tasks assessed/performed Overall Cognitive Status: Within Functional Limits for tasks assessed                      General Comments      Exercises Total Joint Exercises Ankle Circles/Pumps: PROM;Both;10 reps Quad Sets: AROM;Both;5 reps Gluteal Sets: AROM;Both;5 reps Hip ABduction/ADduction: AAROM;Right;5 reps      Assessment/Plan    PT Assessment Patient needs continued PT services  PT Diagnosis Difficulty walking   PT Problem List Decreased strength;Decreased range of motion;Decreased activity tolerance;Decreased balance;Decreased mobility;Decreased knowledge of use of DME;Decreased knowledge of  precautions;Pain  PT Treatment Interventions DME instruction;Gait training;Functional mobility training;Therapeutic activities;Therapeutic exercise;Balance training;Patient/family education   PT Goals (Current goals can be found in the Care Plan section) Acute Rehab PT Goals Patient Stated Goal: back to motorcycle trips, his dance hall, showing his antique car PT Goal Formulation: With patient Time For Goal Achievement: 07/03/13 Potential to Achieve Goals: Good    Frequency 7X/week   Barriers to discharge         Co-evaluation               End of Session Equipment Utilized During Treatment: Gait belt Activity Tolerance: Patient tolerated treatment well Patient left: in chair;with call bell/phone within reach;with family/visitor present Nurse Communication: Mobility status         Time: 0459-9774 PT Time Calculation (min): 29 min   Charges:   PT Evaluation $Initial PT Evaluation Tier I: 1 Procedure PT Treatments $Gait Training: 8-22 mins $Therapeutic Activity: 8-22 mins   PT G Codes:          Quin Hoop 06/26/2013, 4:40 PM  [place], [place]  Acute Rehabilitation Services Pager [phone] Office [phone]

## 2013-06-26 NOTE — Op Note (Signed)
PATIENT ID:      JOVANE FOUTZ  MRN:     [id number] DOB/AGE:    78-Sep-1931 / 78 y.o.       OPERATIVE REPORT    DATE OF PROCEDURE:  06/26/2013       PREOPERATIVE DIAGNOSIS:   RIGHT HIP AVASCULAR NECROSIS                                                       Estimated body mass index is 27.53 kg/(m^2) as calculated from the following:   Height as of 06/21/13: 5\' 8"  (1.727 m).   Weight as of this encounter: 82.101 kg (181 lb).     POSTOPERATIVE DIAGNOSIS:   RIGHT HIP AVASCULAR NECROSIS                                                                     Estimated body mass index is 27.53 kg/(m^2) as calculated from the following:   Height as of 06/21/13: 5\' 8"  (1.727 m).   Weight as of this encounter: 82.101 kg (181 lb).     PROCEDURE:  Procedure(s): TOTAL HIP ARTHROPLASTY right     SURGEON:  [name], MD    ASSISTANT:   Biagio Borg, PA-C   (Present and scrubbed throughout the case, critical for assistance with exposure, retraction, instrumentation, and closure.)          ANESTHESIA: none     DRAINS: none :      TOURNIQUET TIME: * No tourniquets in log *    COMPLICATIONS:  None   CONDITION:  stable  PROCEDURE IN DETAIL: [place], PETER W 06/26/2013, 9:25 AM

## 2013-06-26 NOTE — Anesthesia Preprocedure Evaluation (Signed)
Anesthesia Evaluation  Patient identified by MRN, date of birth, ID band Patient awake    Reviewed: Allergy & Precautions, H&P , NPO status , Patient's Chart, lab work & pertinent test results, reviewed documented beta blocker date and time   Airway Mallampati: II TM Distance: >3 FB Neck ROM: full    Dental   Pulmonary shortness of breath and with exertion, pneumonia -, resolved, former smoker,  breath sounds clear to auscultation        Cardiovascular hypertension, Pt. on medications and Pt. on home beta blockers + CAD, + Cardiac Stents and + Peripheral Vascular Disease + dysrhythmias Atrial Fibrillation + Valvular Problems/Murmurs MR Rhythm:regular     Neuro/Psych  Neuromuscular disease negative psych ROS   GI/Hepatic Neg liver ROS, GERD-  Medicated and Controlled,  Endo/Other  diabetes  Renal/GU Renal InsufficiencyRenal disease  negative genitourinary   Musculoskeletal   Abdominal   Peds  Hematology negative hematology ROS (+)   Anesthesia Other Findings See surgeon's H&P   Reproductive/Obstetrics negative OB ROS                           Anesthesia Physical Anesthesia Plan  ASA: III  Anesthesia Plan: General   Post-op Pain Management:    Induction: Intravenous  Airway Management Planned: Oral ETT  Additional Equipment:   Intra-op Plan:   Post-operative Plan: Extubation in OR  Informed Consent: I have reviewed the patients History and Physical, chart, labs and discussed the procedure including the risks, benefits and alternatives for the proposed anesthesia with the patient or authorized representative who has indicated his/her understanding and acceptance.   Dental Advisory Given  Plan Discussed with: CRNA and Surgeon  Anesthesia Plan Comments:         Anesthesia Quick Evaluation

## 2013-06-26 NOTE — Plan of Care (Signed)
Problem: Consults Goal: Diagnosis- Total Joint Replacement Outcome: Completed/Met Date Met:  06/26/13 Primary Total Hip

## 2013-06-26 NOTE — Anesthesia Postprocedure Evaluation (Signed)
Anesthesia Post Note  Patient: Greg Booker  Procedure(s) Performed: Procedure(s) (LRB): TOTAL HIP ARTHROPLASTY (Right)  Anesthesia type: General  Patient location: PACU  Post pain: Pain level controlled  Post assessment: Patient's Cardiovascular Status Stable  Last Vitals:  Filed Vitals:   06/26/13 1115  BP:   Pulse: 61  Temp:   Resp: 8    Post vital signs: Reviewed and stable  Level of consciousness: alert  Complications: No apparent anesthesia complications

## 2013-06-27 ENCOUNTER — Encounter (HOSPITAL_COMMUNITY): Payer: Self-pay | Admitting: Orthopaedic Surgery

## 2013-06-27 LAB — BASIC METABOLIC PANEL
BUN: 27 mg/dL — AB (ref 6–23)
CALCIUM: 8.8 mg/dL (ref 8.4–10.5)
CO2: 23 meq/L (ref 19–32)
Chloride: 97 mEq/L (ref 96–112)
Creatinine, Ser: 1.42 mg/dL — ABNORMAL HIGH (ref 0.50–1.35)
GFR calc Af Amer: 52 mL/min — ABNORMAL LOW (ref >90)
GFR calc non Af Amer: 44 mL/min — ABNORMAL LOW (ref >90)
GLUCOSE: 153 mg/dL — AB (ref 70–99)
Potassium: 4.7 mEq/L (ref 3.7–5.3)
Sodium: 138 mEq/L (ref 137–147)

## 2013-06-27 LAB — GLUCOSE, CAPILLARY
GLUCOSE-CAPILLARY: 127 mg/dL — AB (ref 70–99)
Glucose-Capillary: 140 mg/dL — ABNORMAL HIGH (ref 70–99)
Glucose-Capillary: 138 mg/dL — ABNORMAL HIGH (ref 70–99)
Glucose-Capillary: 123 mg/dL — ABNORMAL HIGH (ref 70–99)

## 2013-06-27 LAB — PROTIME-INR
INR: 1.27 (ref 0.00–1.49)
Prothrombin Time: 15.6 seconds — ABNORMAL HIGH (ref 11.6–15.2)

## 2013-06-27 LAB — CBC
HCT: 27.6 % — ABNORMAL LOW (ref 39.0–52.0)
HEMOGLOBIN: 9.5 g/dL — AB (ref 13.0–17.0)
MCH: 32.8 pg (ref 26.0–34.0)
MCHC: 34.4 g/dL (ref 30.0–36.0)
MCV: 95.2 fL (ref 78.0–100.0)
PLATELETS: 206 10*3/uL (ref 150–400)
RBC: 2.9 MIL/uL — ABNORMAL LOW (ref 4.22–5.81)
RDW: 14.1 % (ref 11.5–15.5)
WBC: 10.2 10*3/uL (ref 4.0–10.5)

## 2013-06-27 MED ORDER — SODIUM CHLORIDE 0.9 % IV SOLN: INTRAVENOUS | Status: AC

## 2013-06-27 MED ORDER — WARFARIN SODIUM 5 MG PO TABS
5.0000 mg | ORAL_TABLET | Freq: Once | ORAL | Status: AC
  Administered 2013-06-27: 5 mg via ORAL
  Filled 2013-06-27 (×2): qty 1

## 2013-06-27 MED ORDER — KETOROLAC TROMETHAMINE 15 MG/ML IJ SOLN
INTRAMUSCULAR | Status: AC
  Administered 2013-06-27: 7.5 mg via INTRAVENOUS
  Filled 2013-06-27: qty 1

## 2013-06-27 MED ORDER — KETOROLAC TROMETHAMINE 15 MG/ML IJ SOLN
INTRAMUSCULAR | Status: AC
  Administered 2013-06-27: 7.5 mg
  Filled 2013-06-27: qty 1

## 2013-06-27 NOTE — Clinical Social Work Psychosocial (Signed)
Clinical Social Work Department BRIEF PSYCHOSOCIAL ASSESSMENT 06/27/2013  Patient:  Greg Booker,Greg Booker     Account Number:  [id number]     Admit date:  06/26/2013  Clinical Social Worker:  Daiva Huge  Date/Time:  06/27/2013 01:48 PM  Referred by:  Physician  Date Referred:  06/27/2013 Referred for  SNF Placement   Other Referral:   Interview type:  Other - See comment Other interview type:   Met with patient and his friend, Leisure centre manager    PSYCHOSOCIAL DATA Living Status:  ALONE Admitted from facility:   Level of care:   Primary support name:  Gladys Primary support relationship to patient:  FRIEND Degree of support available:   minimal physical assist    CURRENT CONCERNS Current Concerns  Post-Acute Placement   Other Concerns:    SOCIAL WORK ASSESSMENT / PLAN Met with patient and his friend, Greg Booker, at bedside- he states, "Greg Booker is my right hand".  Patient reports that he has set up pre-operatively for SNF bed at [facility] in [place] where he has been before. He was pleased with the facilities and his rehab when he went for his other hip-  "I know they do good rehab and I know I need it". Patient encouraged by his walk in hall today with PT but wants to push himself to get best results-   Assessment/plan status:  Other - See comment Other assessment/ plan:   FL2 and PASARR for SNF   Information/referral to community resources:   SNF list    PATIENT'S/FAMILY'S RESPONSE TO PLAN OF CARE: Patient appreciative of CSW visit and assistance- he is motivated and eager to continue has rehab and get to [place], MSW, [place]

## 2013-06-27 NOTE — Clinical Social Work Note (Signed)
SNF bed confirmed and accepted at [facility]. I have advised SNF of ?d/c tomorrow- FL2 on chart for MD review/signature.  Eduard Clos, MSW, [place]

## 2013-06-27 NOTE — Progress Notes (Signed)
OT Cancellation Note  Patient Details Name: Greg Booker MRN: [id number] DOB: 1929-07-30   Cancelled Treatment:    Reason Eval/Treat Not Completed: OT screened. Pt planning to d/c to SNF. No acute OT needs at this time.     Benito Mccreedy OTR/L [phone] 06/27/2013, 3:07 PM

## 2013-06-27 NOTE — Progress Notes (Signed)
Physical Therapy Treatment Patient Details Name: Greg Booker MRN: [id number] DOB: [birth year] Today's Date: 06/27/2013    History of Present Illness s/p R THA posterior approach; WBAT    PT Comments    Patient progressing very well this morning with ambulation and therex. Cues for safety and not to get up without assistance. Continue to recommend SNF for ongoing Physical Therapy.     Follow Up Recommendations  SNF     Equipment Recommendations  None recommended by PT    Recommendations for Other Services       Precautions / Restrictions Precautions Precautions: Posterior Hip Precaution Comments: Patient reeducated on all precautions Restrictions RLE Weight Bearing: Weight bearing as tolerated    Mobility  Bed Mobility               General bed mobility comments: Patient in recliner before or after session  Transfers Overall transfer level: Needs assistance Equipment used: Rolling walker (2 wheeled)   Sit to Stand: Min guard         General transfer comment: Cues for safe hand placement  Ambulation/Gait Ambulation/Gait assistance: Min guard Ambulation Distance (Feet): 120 Feet Assistive device: Rolling walker (2 wheeled) Gait Pattern/deviations: Step-through pattern;Decreased stride length     General Gait Details: Cues for proper use and positioning of RW. Steady gait   Stairs            Wheelchair Mobility    Modified Rankin (Stroke Patients Only)       Balance                                    Cognition Arousal/Alertness: Awake/alert Behavior During Therapy: WFL for tasks assessed/performed Overall Cognitive Status: Within Functional Limits for tasks assessed                      Exercises Total Joint Exercises Quad Sets: AROM;Both;10 reps Gluteal Sets: 10 reps;Both;AROM Hip ABduction/ADduction: AAROM;Right;10 reps Long Arc Quad: AROM;Right;10 reps    General Comments        Pertinent  Vitals/Pain no apparent distress     Home Living                      Prior Function            PT Goals (current goals can now be found in the care plan section) Progress towards PT goals: Progressing toward goals    Frequency  7X/week    PT Plan Current plan remains appropriate    Co-evaluation             End of Session Equipment Utilized During Treatment: Gait belt Activity Tolerance: Patient tolerated treatment well Patient left: in chair;with call bell/phone within reach;with family/visitor present     Time: 2353-6144 PT Time Calculation (min): 24 min  Charges:  $Gait Training: 8-22 mins $Therapeutic Exercise: 8-22 mins                    G Codes:      Jacqualyn Posey 06/27/2013, 12:03 PM 06/27/2013 Jacqualyn Posey PTA [phone] pager [phone] office

## 2013-06-27 NOTE — Clinical Social Work Placement (Signed)
Clinical Social Work Department CLINICAL SOCIAL WORK PLACEMENT NOTE 06/27/2013  Patient:  Faughn,Angeles P  Account Number:  [id number] Admit date:  06/26/2013  Clinical Social Worker:  Daiva Huge  Date/time:  06/27/2013 02:04 PM  Clinical Social Work is seeking post-discharge placement for this patient at the following level of care:   SKILLED NURSING   (*CSW will update this form in Epic as items are completed)   06/27/2013  Patient/family provided with [place] Department of Clinical Social Work's list of facilities offering this level of care within the geographic area requested by the patient (or if unable, by the patient's family).  06/27/2013  Patient/family informed of their freedom to choose among providers that offer the needed level of care, that participate in Medicare, Medicaid or managed care program needed by the patient, have an available bed and are willing to accept the patient.  06/27/2013  Patient/family informed of [facility]' ownership interest in [facility], as well as of the fact that they are under no obligation to receive care at this facility.  PASARR submitted to [employer] on 06/27/2013 PASARR number received on 06/27/2013  FL2 transmitted to all facilities in geographic area requested by pt/family on  06/27/2013 FL2 transmitted to all facilities within larger geographic area on   Patient informed that his/her managed care company has contracts with or will negotiate with  certain facilities, including the following:     Patient/family informed of bed offers received:   Patient chooses bed at  Physician recommends and patient chooses bed at    Patient to be transferred to  on   Patient to be transferred to facility by  Patient and family notified of transfer on  Name of family member notified:    The following physician request were entered in Epic:   Additional Comments:  Eduard Clos, MSW, [place]

## 2013-06-27 NOTE — Progress Notes (Signed)
Physical Therapy Treatment Patient Details Name: CALEB DECOCK MRN: [id number] DOB: [birth year] Today's Date: 06/27/2013    History of Present Illness s/p R THA posterior approach; WBAT    PT Comments    Patient continues to be highly motivated to work with therapy. Anticipate DC to SNF tomorrow.   Follow Up Recommendations  SNF     Equipment Recommendations  None recommended by PT    Recommendations for Other Services       Precautions / Restrictions Precautions Precautions: Posterior Hip Precaution Comments: Patient reeducated on all precautions Restrictions RLE Weight Bearing: Weight bearing as tolerated    Mobility  Bed Mobility Overal bed mobility: Needs Assistance Bed Mobility: Sit to Supine     Supine to sit: Min assist Sit to supine: Min assist   General bed mobility comments: A for L LE in and out of bed. Cues for posiitoning to maintain precautions  Transfers Overall transfer level: Needs assistance Equipment used: Rolling walker (2 wheeled)   Sit to Stand: Min assist         General transfer comment: Cues for safe hand placement. Min A to power up from lower surface  Ambulation/Gait Ambulation/Gait assistance: Min guard Ambulation Distance (Feet): 150 Feet Assistive device: Rolling walker (2 wheeled) Gait Pattern/deviations: Step-through pattern;Decreased stride length     General Gait Details: Cues for proper use and positioning of RW. Steady gait   Stairs            Wheelchair Mobility    Modified Rankin (Stroke Patients Only)       Balance                                    Cognition Arousal/Alertness: Awake/alert Behavior During Therapy: WFL for tasks assessed/performed Overall Cognitive Status: Within Functional Limits for tasks assessed                      Exercises Total Joint Exercises Quad Sets: AROM;Both;10 reps Gluteal Sets: 10 reps;Both;AROM Hip ABduction/ADduction: AAROM;Right;10  reps Long Arc Quad: AROM;Right;10 reps    General Comments        Pertinent Vitals/Pain no apparent distress     Home Living                      Prior Function            PT Goals (current goals can now be found in the care plan section) Progress towards PT goals: Progressing toward goals    Frequency  7X/week    PT Plan Current plan remains appropriate    Co-evaluation             End of Session Equipment Utilized During Treatment: Gait belt Activity Tolerance: Patient tolerated treatment well Patient left: in bed;with call bell/phone within reach     Time: 1415-1432 PT Time Calculation (min): 17 min  Charges:  $Gait Training: 8-22 mins $Therapeutic Exercise: 8-22 mins                    G Codes:      Jacqualyn Posey 06/27/2013, 2:56 PM 06/27/2013 Jacqualyn Posey PTA [phone] pager [phone] office

## 2013-06-27 NOTE — Progress Notes (Signed)
ANTICOAGULATION CONSULT NOTE - Follow Up Consult  Pharmacy Consult for coumadin Indication: atrial fibrillation; VTE prophylaxis  Allergies  Allergen Reactions  . Bicalutamide Other (See Comments)    Casodex caused increase in breast size  . Lovastatin Other (See Comments)    Leg weakness  . Niacin And Related Other (See Comments)    Burning and stinging from tablets (otc capsules ok)  . Pravastatin Other (See Comments)    myalgias  . Codeine Itching and Rash    Patient Measurements: Weight: 181 lb (82.101 kg)  Vital Signs: Temp: 98.3 F (36.8 C) (06/17 0541) BP: 131/58 mmHg (06/17 1057) Pulse Rate: 73 (06/17 0541)  Labs:  Recent Labs  06/26/13 0551 06/26/13 1215 06/27/13 0500  HGB  --  11.3* 9.5*  HCT  --  32.5* 27.6*  PLT  --  242 206  APTT 35  --   --   LABPROT 14.6  --  15.6*  INR 1.16  --  1.27  CREATININE  --  1.26 1.42*    The CrCl is unknown because both a height and weight (above a minimum accepted value) are required for this calculation.  Assessment: Patient is an 80 y.o Mon coumadin for hx Afib and VTE prophylaxis s/p THA.  INR is 1.27 but is trending up.  hgb down 9.5, no bleeding documented.  Goal of Therapy:  INR 2-3    Plan:  1) coumadin 5mg  PO x1 today   Pham, Anh P 06/27/2013,12:48 PM

## 2013-06-27 NOTE — Progress Notes (Signed)
Patient ID: Greg Booker, male   DOB: 1929-09-20, 80 y.o.   MRN: [id number] PATIENT ID: Greg Booker        MRN:  [id number]          DOB/AGE: Mar 17, 1929 / 80 y.o.    [name], MD   Biagio Borg, PA-C [address] [place], [place]  [address]                             [phone]   PROGRESS NOTE  Subjective:  negative for Chest Pain  negative for Shortness of Breath  negative for Nausea/Vomiting   negative for Calf Pain    Tolerating Diet: yes         Patient reports pain as mild.     Sitting up in chair states he feels much better.  Objective: Vital signs in last 24 hours:   Patient Vitals for the past 24 hrs:  BP Temp Temp src Pulse Resp SpO2  06/27/13 0541 131/58 mmHg 98.3 F (36.8 C) - 73 16 100 %  06/27/13 0400 - - - - 16 100 %  06/27/13 0118 130/79 mmHg 98.2 F (36.8 C) - 72 16 100 %  06/27/13 0000 - - - - 16 99 %  06/26/13 2104 114/46 mmHg 98.2 F (36.8 C) - 67 16 99 %  06/26/13 1600 - - - - 16 -  06/26/13 1200 115/56 mmHg 97.7 F (36.5 C) Oral 70 16 100 %  06/26/13 1126 - 97.7 F (36.5 C) - 59 13 100 %  06/26/13 1118 132/52 mmHg - - - - -  06/26/13 1115 - - - 61 8 100 %  06/26/13 1103 117/46 mmHg - - - - -  06/26/13 1056 - - - 75 22 100 %  06/26/13 1048 135/65 mmHg - - - - -  06/26/13 1045 - - - 46 16 100 %  06/26/13 1033 147/50 mmHg - - - - -  06/26/13 1030 - - - 72 13 100 %  06/26/13 1018 129/69 mmHg - - - - -  06/26/13 1015 - - - 63 15 100 %  06/26/13 1004 137/51 mmHg - - - - -  06/26/13 1000 - - - 84 10 100 %  06/26/13 0949 139/61 mmHg - - - - -  06/26/13 0947 - 97.7 F (36.5 C) - - - -      Intake/Output from previous day:   06/16 0701 - 06/17 0700 In: 1760 [P.O.:80; I.V.:1680] Out: 2850 [Urine:2550]   Intake/Output this shift:       Intake/Output     06/16 0701 - 06/17 0700 06/17 0701 - 06/18 0700   P.O. 80    I.V. (mL/kg) 1680 (20.5)    Total Intake(mL/kg) 1760 (21.4)    Urine (mL/kg/hr) 2550 (1.3)    Blood 300 (0.2)     Total Output 2850     Net -1090             LABORATORY DATA:  Recent Labs  06/26/13 1215 06/27/13 0500  WBC 11.4* 10.2  HGB 11.3* 9.5*  HCT 32.5* 27.6*  PLT 242 206    Recent Labs  06/26/13 1215 06/27/13 0500  NA  --  138  K  --  4.7  CL  --  97  CO2  --  23  BUN  --  27*  CREATININE 1.26 1.42*  GLUCOSE  --  153*  CALCIUM  --  8.8   Lab Results  Component Value Date   INR 1.27 06/27/2013   INR 1.16 06/26/2013   INR 2.9 06/21/2013    Recent Radiographic Studies :  Dg Pelvis Portable  06/26/2013   CLINICAL DATA:  Status post right hip arthroplasty.  EXAM: PORTABLE PELVIS 1-2 VIEWS; PORTABLE RIGHT HIP - 1 VIEW  COMPARISON:  CT of the pelvis 06/02/2012.  FINDINGS: Postoperative changes of right total hip arthroplasty are noted. The femoral and acetabular components of the prosthesis appear properly seated without definite periprosthetic fracture or other immediate complicating features. The prosthetic femoral head is located within the prosthetic acetabulum. Old left total hip arthroplasty is also noted. Brachytherapy beads are seen within the region of the prostate gland. Surgical clips are seen lateral to the right hip joint. Small amount of subcutaneous emphysema is noted surrounding the right hip joint.  IMPRESSION: 1. Postoperative changes of right total hip arthroplasty without complicating features, as above.   Electronically Signed   By: Vinnie Langton M.D.   On: 06/26/2013 10:53   Dg Hip Portable 1 View Right  06/26/2013   CLINICAL DATA:  Status post right hip arthroplasty.  EXAM: PORTABLE PELVIS 1-2 VIEWS; PORTABLE RIGHT HIP - 1 VIEW  COMPARISON:  CT of the pelvis 06/02/2012.  FINDINGS: Postoperative changes of right total hip arthroplasty are noted. The femoral and acetabular components of the prosthesis appear properly seated without definite periprosthetic fracture or other immediate complicating features. The prosthetic femoral head is located within the  prosthetic acetabulum. Old left total hip arthroplasty is also noted. Brachytherapy beads are seen within the region of the prostate gland. Surgical clips are seen lateral to the right hip joint. Small amount of subcutaneous emphysema is noted surrounding the right hip joint.  IMPRESSION: 1. Postoperative changes of right total hip arthroplasty without complicating features, as above.   Electronically Signed   By: Vinnie Langton M.D.   On: 06/26/2013 10:53     Examination:  General appearance: alert, cooperative and mild distress Resp: clear to auscultation bilaterally Cardio: irregularly irregular rhythm GI: normal findings: bowel sounds normal  Wound Exam: clean, dry, intact   Drainage:  Scant/small amount Bloody exudate  Motor Exam: EHL, FHL, Anterior Tibial and Posterior Tibial Intact  Sensory Exam: Superficial Peroneal, Deep Peroneal and Tibial normal  Vascular Exam: Right dorsalis pedis artery has 1+ (weak) pulse  Assessment:    1 Day Post-Op  Procedure(s) (LRB): TOTAL HIP ARTHROPLASTY (Right)  ADDITIONAL DIAGNOSIS:  Active Problems:   S/P total hip arthroplasty  Acute Blood Loss Anemia (asymptomatic), Diabetes and Cardiac Arrythmia AFIB   Plan: Physical Therapy as ordered Weight Bearing as Tolerated (WBAT)  DVT Prophylaxis:  Lovenox and Coumadin  DISCHARGE PLAN: Skilled Nursing Facility/Rehab  DISCHARGE NEEDS: HHPT, Walker and 3-in-1 comode seat  Plan for [facility] in [place] tomorrow if stable         [facility] 06/27/2013 8:27 AM

## 2013-06-28 DIAGNOSIS — M87051 Idiopathic aseptic necrosis of right femur: Secondary | ICD-10-CM | POA: Diagnosis present

## 2013-06-28 LAB — BASIC METABOLIC PANEL
BUN: 30 mg/dL — AB (ref 6–23)
CHLORIDE: 101 meq/L (ref 96–112)
CO2: 25 mEq/L (ref 19–32)
Calcium: 9.2 mg/dL (ref 8.4–10.5)
Creatinine, Ser: 1.49 mg/dL — ABNORMAL HIGH (ref 0.50–1.35)
GFR calc non Af Amer: 42 mL/min — ABNORMAL LOW (ref >90)
GFR, EST AFRICAN AMERICAN: 49 mL/min — AB (ref >90)
GLUCOSE: 151 mg/dL — AB (ref 70–99)
POTASSIUM: 4.8 meq/L (ref 3.7–5.3)
SODIUM: 141 meq/L (ref 137–147)

## 2013-06-28 LAB — CBC
HCT: 28.4 % — ABNORMAL LOW (ref 39.0–52.0)
HEMOGLOBIN: 9.7 g/dL — AB (ref 13.0–17.0)
MCH: 32.6 pg (ref 26.0–34.0)
MCHC: 34.2 g/dL (ref 30.0–36.0)
MCV: 95.3 fL (ref 78.0–100.0)
Platelets: 236 10*3/uL (ref 150–400)
RBC: 2.98 MIL/uL — ABNORMAL LOW (ref 4.22–5.81)
RDW: 14.5 % (ref 11.5–15.5)
WBC: 12 10*3/uL — AB (ref 4.0–10.5)

## 2013-06-28 LAB — PROTIME-INR
INR: 1.68 — AB (ref 0.00–1.49)
Prothrombin Time: 19.3 seconds — ABNORMAL HIGH (ref 11.6–15.2)

## 2013-06-28 LAB — GLUCOSE, CAPILLARY
GLUCOSE-CAPILLARY: 127 mg/dL — AB (ref 70–99)
Glucose-Capillary: 121 mg/dL — ABNORMAL HIGH (ref 70–99)

## 2013-06-28 MED ORDER — WARFARIN SODIUM 5 MG PO TABS: 2.5000 mg | ORAL_TABLET | Freq: Every day | ORAL | Status: DC

## 2013-06-28 MED ORDER — BISACODYL 10 MG RE SUPP: 10.0000 mg | Freq: Every day | RECTAL | Status: DC | PRN

## 2013-06-28 MED ORDER — ENOXAPARIN SODIUM 40 MG/0.4ML ~~LOC~~ SOLN: 40.0000 mg | SUBCUTANEOUS | Status: DC

## 2013-06-28 MED ORDER — HYDROCODONE-ACETAMINOPHEN 5-325 MG PO TABS: 1.0000 | ORAL_TABLET | ORAL | Status: DC | PRN

## 2013-06-28 MED ORDER — DSS 100 MG PO CAPS: 100.0000 mg | ORAL_CAPSULE | Freq: Two times a day (BID) | ORAL | Status: DC

## 2013-06-28 NOTE — Clinical Social Work Placement (Addendum)
Clinical Social Work Department CLINICAL SOCIAL WORK PLACEMENT NOTE 06/28/2013  Patient:  Greg Booker,Greg Booker  Account Number:  [id number] Admit date:  06/26/2013  Clinical Social Worker:  Daiva Huge  Date/time:  06/27/2013 02:04 PM  Clinical Social Work is seeking post-discharge placement for this patient at the following level of care:   SKILLED NURSING   (*CSW will update this form in Epic as items are completed)   06/27/2013  Patient/family provided with [facility] Department of Clinical Social Work's list of facilities offering this level of care within the geographic area requested by the patient (or if unable, by the patient's family).  06/27/2013  Patient/family informed of their freedom to choose among providers that offer the needed level of care, that participate in Medicare, Medicaid or managed care program needed by the patient, have an available bed and are willing to accept the patient.  06/27/2013  Patient/family informed of [facility]' ownership interest in [facility], as well as of the fact that they are under no obligation to receive care at this facility.  PASARR submitted to EDS on 06/27/2013 PASARR number received on 06/27/2013  FL2 transmitted to all facilities in geographic area requested by pt/family on  06/27/2013 FL2 transmitted to all facilities within larger geographic area on   Patient informed that his/her managed care company has contracts with or will negotiate with  certain facilities, including the following:     Patient/family informed of bed offers received:  06/28/2013 Patient chooses bed at Other Physician recommends and patient chooses bed at    Patient to be transferred to Other on  06/28/2013 Patient to be transferred to facility by EMS Patient and family notified of transfer on 06/28/2013 Name of family member notified:  [place]  The following physician request were entered in Epic:   Additional  Comments: Eduard Clos, MSW, [place]

## 2013-06-28 NOTE — Care Management Note (Signed)
CARE MANAGEMENT NOTE 06/28/2013  Patient:  Greg Booker,Greg Booker   Account Number:  [id number]  Date Initiated:  06/28/2013  Documentation initiated by:  Ricki Miller  Subjective/Objective Assessment:   80 yr old male s/Booker right total hip arthroplasty.     Action/Plan:   Patient is for shortterm rehab at [facility]. Will go to [facility].   Anticipated DC Date:  06/28/2013   Anticipated DC Plan:  SKILLED NURSING FACILITY  In-house referral  Clinical Social Worker      DC Planning Services  CM consult      Choice offered to / List presented to:             Status of service:  Completed, signed off Medicare Important Message given?  NA - LOS <3 / Initial given by admissions (If response is "NO", the following Medicare IM given date fields will be blank) Date Medicare IM given:   Date Additional Medicare IM given:    Discharge Disposition:  [facility]  Per UR Regulation:  Reviewed for med. necessity/level of care/duration of stay  If discussed at [place] of Stay Meetings, dates discussed:    Comments:

## 2013-06-28 NOTE — Progress Notes (Signed)
Patient ID: Greg Booker, male   DOB: 05-29-1929, 80 y.o.   MRN: [id number] PATIENT ID: Greg Booker        MRN:  [id number]          DOB/AGE: 12/15/1929 / 80 y.o.    [name], MD   Biagio Borg, PA-C [address] [place], [place]  [address]                             [phone]   PROGRESS NOTE  Subjective:  negative for Chest Pain  negative for Shortness of Breath  negative for Nausea/Vomiting   negative for Calf Pain    Tolerating Diet: yes         Patient reports pain as mild and moderate.     More sore today.  Objective: Vital signs in last 24 hours:   Patient Vitals for the past 24 hrs:  BP Temp Pulse Resp SpO2  06/28/13 0525 105/43 mmHg 98.8 F (37.1 C) 55 18 96 %  06/27/13 2122 145/45 mmHg 98.7 F (37.1 C) 90 18 97 %  06/27/13 1335 148/73 mmHg 98 F (36.7 C) 79 18 99 %  06/27/13 1057 131/58 mmHg - - - -      Intake/Output from previous day:   06/17 0701 - 06/18 0700 In: 450 [P.O.:450] Out: 1550 [Urine:1550]   Intake/Output this shift:       Intake/Output     06/17 0701 - 06/18 0700 06/18 0701 - 06/19 0700   P.O. 450    I.V. (mL/kg)     Total Intake(mL/kg) 450 (5.5)    Urine (mL/kg/hr) 1550 (0.8)    Blood     Total Output 1550     Net -1100             LABORATORY DATA:  Recent Labs  06/26/13 1215 06/27/13 0500 06/28/13 0525  WBC 11.4* 10.2 12.0*  HGB 11.3* 9.5* 9.7*  HCT 32.5* 27.6* 28.4*  PLT 242 206 236    Recent Labs  06/26/13 1215 06/27/13 0500 06/28/13 0525  NA  --  138 141  K  --  4.7 4.8  CL  --  97 101  CO2  --  23 25  BUN  --  27* 30*  CREATININE 1.26 1.42* 1.49*  GLUCOSE  --  153* 151*  CALCIUM  --  8.8 9.2   Lab Results  Component Value Date   INR 1.68* 06/28/2013   INR 1.27 06/27/2013   INR 1.16 06/26/2013    Recent Radiographic Studies :  Dg Pelvis Portable  06/26/2013   CLINICAL DATA:  Status post right hip arthroplasty.  EXAM: PORTABLE PELVIS 1-2 VIEWS; PORTABLE RIGHT HIP - 1 VIEW  COMPARISON:   CT of the pelvis 06/02/2012.  FINDINGS: Postoperative changes of right total hip arthroplasty are noted. The femoral and acetabular components of the prosthesis appear properly seated without definite periprosthetic fracture or other immediate complicating features. The prosthetic femoral head is located within the prosthetic acetabulum. Old left total hip arthroplasty is also noted. Brachytherapy beads are seen within the region of the prostate gland. Surgical clips are seen lateral to the right hip joint. Small amount of subcutaneous emphysema is noted surrounding the right hip joint.  IMPRESSION: 1. Postoperative changes of right total hip arthroplasty without complicating features, as above.   Electronically Signed   By: Vinnie Langton M.D.   On: 06/26/2013 10:53  Dg Hip Portable 1 View Right  06/26/2013   CLINICAL DATA:  Status post right hip arthroplasty.  EXAM: PORTABLE PELVIS 1-2 VIEWS; PORTABLE RIGHT HIP - 1 VIEW  COMPARISON:  CT of the pelvis 06/02/2012.  FINDINGS: Postoperative changes of right total hip arthroplasty are noted. The femoral and acetabular components of the prosthesis appear properly seated without definite periprosthetic fracture or other immediate complicating features. The prosthetic femoral head is located within the prosthetic acetabulum. Old left total hip arthroplasty is also noted. Brachytherapy beads are seen within the region of the prostate gland. Surgical clips are seen lateral to the right hip joint. Small amount of subcutaneous emphysema is noted surrounding the right hip joint.  IMPRESSION: 1. Postoperative changes of right total hip arthroplasty without complicating features, as above.   Electronically Signed   By: Vinnie Langton M.D.   On: 06/26/2013 10:53     Examination:  General appearance: alert, cooperative and mild distress Resp: clear to auscultation bilaterally Cardio: irregularly irregular rhythm GI: normal findings: bowel sounds normal  Wound  Exam: clean, dry, intact   Drainage:  None: wound tissue dry  Motor Exam: EHL, FHL, Anterior Tibial and Posterior Tibial Intact  Sensory Exam: Superficial Peroneal, Deep Peroneal and Tibial normal  Vascular Exam: Right dorsalis pedis artery has 1+ (weak) pulse  Assessment:    2 Days Post-Op  Procedure(s) (LRB): TOTAL HIP ARTHROPLASTY (Right)  ADDITIONAL DIAGNOSIS:  Active Problems:   S/P total hip arthroplasty  Acute Blood Loss Anemia - asymptomatic, Diabetes and Renal Insufficiency Chronic   Plan: Physical Therapy as ordered Weight Bearing as Tolerated (WBAT)  DVT Prophylaxis:  Lovenox, Coumadin, Foot Pumps and TED hose  DISCHARGE PLAN: Skilled Nursing Facility/Rehab [facility] in [place] today if they will accept  DISCHARGE NEEDS: HHPT, Walker and 3-in-1 comode seat has at home         [facility] 06/28/2013 8:37 AM

## 2013-06-28 NOTE — Progress Notes (Signed)
Physical Therapy Treatment Patient Details Name: Greg Booker MRN: [id number] DOB: [birth year] Today's Date: 06/28/2013    History of Present Illness s/p R THA posterior approach; WBAT    PT Comments    Progressing steadily towards goals. Continue to recommend snf for rehab prior to returning home as patients wife cannot assist him.   Follow Up Recommendations  SNF     Equipment Recommendations  None recommended by PT    Recommendations for Other Services       Precautions / Restrictions Precautions Precautions: Posterior Hip Precaution Comments: Patient able to recall all precautions Restrictions RLE Weight Bearing: Weight bearing as tolerated    Mobility  Bed Mobility Overal bed mobility: Needs Assistance       Supine to sit: Min assist     General bed mobility comments: A for L LE in and out of bed. Cues for posiitoning to maintain precautions  Transfers Overall transfer level: Needs assistance Equipment used: Rolling walker (2 wheeled) Transfers: Sit to/from Stand Sit to Stand: Min guard         General transfer comment: Cues for safe hand placement. Cues to control descent back into recliner  Ambulation/Gait Ambulation/Gait assistance: Min guard Ambulation Distance (Feet): 180 Feet Assistive device: Rolling walker (2 wheeled) Gait Pattern/deviations: Step-through pattern;Decreased stride length     General Gait Details: Cues for proper use and positioning of RW. Steady gait   Stairs            Wheelchair Mobility    Modified Rankin (Stroke Patients Only)       Balance                                    Cognition Arousal/Alertness: Awake/alert Behavior During Therapy: WFL for tasks assessed/performed Overall Cognitive Status: Within Functional Limits for tasks assessed                      Exercises Total Joint Exercises Quad Sets: AROM;Both;10 reps Gluteal Sets: 10 reps;Both;AROM Hip  ABduction/ADduction: AAROM;Right;10 reps Long Arc Quad: AROM;Right;10 reps    General Comments        Pertinent Vitals/Pain no apparent distress     Home Living                      Prior Function            PT Goals (current goals can now be found in the care plan section) Progress towards PT goals: Progressing toward goals    Frequency  7X/week    PT Plan Current plan remains appropriate    Co-evaluation             End of Session Equipment Utilized During Treatment: Gait belt Activity Tolerance: Patient tolerated treatment well Patient left: with call bell/phone within reach;in chair     Time: 8527-7824 PT Time Calculation (min): 24 min  Charges:  $Gait Training: 8-22 mins $Therapeutic Exercise: 8-22 mins                    G Codes:      Jacqualyn Posey 06/28/2013, 9:45 AM 06/28/2013 Jacqualyn Posey PTA [phone] pager [phone] office

## 2013-06-28 NOTE — Discharge Summary (Signed)
Greg Fears, MD   Greg Borg, PA-C [address] [place], [place]  [address]                             [phone]  PATIENT ID: Greg Booker        MRN:  [id number]          DOB/AGE: December 29, 1929 / 80 y.o.    DISCHARGE SUMMARY  ADMISSION DATE:    06/26/2013 DISCHARGE DATE:   06/28/2013   ADMISSION DIAGNOSIS: RIGHT HIP AVASCULAR NECROSIS    DISCHARGE DIAGNOSIS:  RIGHT HIP AVASCULAR NECROSIS    ADDITIONAL DIAGNOSIS: Principal Problem:   Avascular necrosis of bone of right hip Active Problems:   S/P total hip arthroplasty  Past Medical History  Diagnosis Date  . GERD (gastroesophageal reflux disease)   . Hyperlipidemia   . S/P primary angioplasty with coronary stent   . Calcified granuloma of lung RIGHT LUNG BASE - STABLE PER CXR 03-03-2010  . Mitral regurgitation   . Chronic back pain   . Sciatic nerve pain LEFT LEG PAIN--  EPI INJECTIONS  . Low back pain radiating to left leg   . Numbness and tingling of left leg   . Impaired hearing BILATERAL AIDS  . Nocturia   . Prostate cancer RECUR    S/P RADIATIVE SEEDS 2006  . Anticoagulated on Coumadin   . PAF (paroxysmal atrial fibrillation)   . Heart murmur   . Peripheral vascular disease     dvt lft leg 10 yrs ago  . Arthritis   . S/P radiation therapy   . Dysrhythmia   . Pneumonia 01/2013  . Diabetes mellitus without complication   . Coronary artery disease     Cardiologist Dr. Debara Pickett  . Hypertension   . Shortness of breath     with exertion  . Urgency of urination     PROCEDURE: Procedure(s): TOTAL HIP ARTHROPLASTY Right on 06/26/2013  CONSULTS: none     HISTORY: Greg Booker is a very pleasant 80 year old white male who is seen today for evaluation of his right hip. He had started developing pain in his right hip and groin earlier this year. He had already been diagnosed with osteoarthritis of his hip and an interarticular injection previously was quite beneficial. He also had a series of  viscosupplementation to his right knee, which was beneficial, however, then he started having this pain in his groin and into the anterior thigh. He asked if we could possibly do a cortisone injection by Dr. Ernestina Patches like he had previously and that was accomplished on May 18th. He did have a little bit of relief, but it actually worsened over time. He has been seen and an x-ray performed, which reveals advanced avascular necrosis with femoral head collapse   HOSPITAL COURSE:  Greg Booker is a 80 y.o. admitted on 06/26/2013 and found to have a diagnosis of [place].  After appropriate laboratory studies were obtained  they were taken to the operating room on 06/26/2013 and underwent  Procedure(s): TOTAL HIP ARTHROPLASTY  Right.   They were given perioperative antibiotics:  Anti-infectives   Start     Dose/Rate Route Frequency Ordered Stop   06/26/13 1330  ceFAZolin (ANCEF) IVPB 2 g/50 mL premix     2 g 100 mL/hr over 30 Minutes Intravenous Every 6 hours 06/26/13 1142 06/26/13 2201   06/26/13 0600  ceFAZolin (ANCEF) IVPB 2 g/50 mL  premix     2 g 100 mL/hr over 30 Minutes Intravenous On call to O.R. 06/25/13 1401 06/26/13 0734    .  Tolerated the procedure well.  Placed with a foley intraoperatively.     Toradol was given post op.  POD #1, allowed out of bed to a chair.  PT for ambulation and exercise program.  Foley D/C'd in morning.  IV saline locked.  O2 discontionued.  POD #2, continued PT and ambulation.  Some soreness today.  Wants to go to [facility] today  The remainder of the hospital course was dedicated to ambulation and strengthening.   The patient was discharged on 2 Days Post-Op in  Stable condition.  Blood products given:none  DIAGNOSTIC STUDIES: Recent vital signs: Patient Vitals for the past 24 hrs:  BP Temp Pulse Resp SpO2  06/28/13 0525 105/43 mmHg 98.8 F (37.1 C) 55 18 96 %  06/27/13 2122 145/45 mmHg 98.7 F (37.1 C) 90 18 97 %  06/27/13 1335 148/73  mmHg 98 F (36.7 C) 79 18 99 %  06/27/13 1057 131/58 mmHg - - - -       Recent laboratory studies:  Recent Labs  06/26/13 1215 06/27/13 0500 06/28/13 0525  WBC 11.4* 10.2 12.0*  HGB 11.3* 9.5* 9.7*  HCT 32.5* 27.6* 28.4*  PLT 242 206 236    Recent Labs  06/26/13 1215 06/27/13 0500 06/28/13 0525  NA  --  138 141  K  --  4.7 4.8  CL  --  97 101  CO2  --  23 25  BUN  --  27* 30*  CREATININE 1.26 1.42* 1.49*  GLUCOSE  --  153* 151*  CALCIUM  --  8.8 9.2   Lab Results  Component Value Date   INR 1.68* 06/28/2013   INR 1.27 06/27/2013   INR 1.16 06/26/2013     Recent Radiographic Studies :  Dg Pelvis Portable  06/26/2013   CLINICAL DATA:  Status post right hip arthroplasty.  EXAM: PORTABLE PELVIS 1-2 VIEWS; PORTABLE RIGHT HIP - 1 VIEW  COMPARISON:  CT of the pelvis 06/02/2012.  FINDINGS: Postoperative changes of right total hip arthroplasty are noted. The femoral and acetabular components of the prosthesis appear properly seated without definite periprosthetic fracture or other immediate complicating features. The prosthetic femoral head is located within the prosthetic acetabulum. Old left total hip arthroplasty is also noted. Brachytherapy beads are seen within the region of the prostate gland. Surgical clips are seen lateral to the right hip joint. Small amount of subcutaneous emphysema is noted surrounding the right hip joint.  IMPRESSION: 1. Postoperative changes of right total hip arthroplasty without complicating features, as above.   Electronically Signed   By: Vinnie Langton M.D.   On: 06/26/2013 10:53   Dg Hip Portable 1 View Right  06/26/2013   CLINICAL DATA:  Status post right hip arthroplasty.  EXAM: PORTABLE PELVIS 1-2 VIEWS; PORTABLE RIGHT HIP - 1 VIEW  COMPARISON:  CT of the pelvis 06/02/2012.  FINDINGS: Postoperative changes of right total hip arthroplasty are noted. The femoral and acetabular components of the prosthesis appear properly seated without definite  periprosthetic fracture or other immediate complicating features. The prosthetic femoral head is located within the prosthetic acetabulum. Old left total hip arthroplasty is also noted. Brachytherapy beads are seen within the region of the prostate gland. Surgical clips are seen lateral to the right hip joint. Small amount of subcutaneous emphysema is noted surrounding the right hip joint.  IMPRESSION: 1. Postoperative changes of right total hip arthroplasty without complicating features, as above.   Electronically Signed   By: Vinnie Langton M.D.   On: 06/26/2013 10:53    DISCHARGE INSTRUCTIONS: Discharge Instructions   Call MD / Call 911    Complete by:  As directed   If you experience chest pain or shortness of breath, CALL 911 and be transported to the hospital emergency room.  If you develop a fever above 101 F, pus (white drainage) or increased drainage or redness at the wound, or calf pain, call your surgeon's office.     Change dressing    Complete by:  As directed   You may change your dressing on SUNDAY, then change the dressing daily with sterile 4 x 4 inch gauze dressing and paper tape.  You may clean the incision with alcohol prior to redressing     Constipation Prevention    Complete by:  As directed   Drink plenty of fluids.  Prune juice and/or coffee may be helpful.  You may use a stool softener, such as Colace (over the counter) 100 mg twice a day.  Use MiraLax (over the counter) for constipation as needed but this may take several days to work.  Mag Citrate --OR-- Milk of Magnesia --OR -- Dulcolax pills/suppositories may also be used but follow directions on the label.     Diet - low sodium heart healthy    Complete by:  As directed      Discharge instructions    Complete by:  As directed   YOU WERE GIVEN A DEVICE CALLED AN INCENTIVE SPIROMETER TO HELP YOU TAKE DEEP BREATHS.  PLEASE USE THIS AT LEAST TEN (10) TIMES EVERY 1-2 HOURS EVERY DAY TO PREVENT PNEUMONIA.     Driving  restrictions    Complete by:  As directed   No driving for 6 weeks     Follow the hip precautions as taught in Physical Therapy    Complete by:  As directed      Increase activity slowly as tolerated    Complete by:  As directed      Lifting restrictions    Complete by:  As directed   No lifting for 6 weeks     Patient may shower    Complete by:  As directed   You may shower over the brown dressing.  Once the dressing is removed you may shower without a dressing once there is no drainage.  Do not wash over the wound.  If drainage remains, cover wound with plastic wrap and then shower.     TED hose    Complete by:  As directed   Use stockings (TED hose) for 1-2 weeks on operative leg(s).  You may remove them at night for sleeping. May stop the NON-operative leg stocking when you go home.     Weight bearing as tolerated    Complete by:  As directed            DISCHARGE MEDICATIONS:     Medication List    STOP taking these medications       diclofenac sodium 1 % Gel  Commonly known as:  VOLTAREN      TAKE these medications       acetaminophen 500 MG tablet  Commonly known as:  TYLENOL  Take 1,000 mg by mouth at bedtime.     b complex vitamins tablet  Take 1 tablet by mouth daily.  bisacodyl 10 MG suppository  Commonly known as:  DULCOLAX  Place 1 suppository (10 mg total) rectally daily as needed for moderate constipation.     digoxin 0.125 MG tablet  Commonly known as:  LANOXIN  Take 0.125 mg by mouth daily.     diltiazem 240 MG 24 hr capsule  Commonly known as:  CARDIZEM CD  Take 240 mg by mouth daily.     DSS 100 MG Caps  Take 100 mg by mouth 2 (two) times daily.     enoxaparin 40 MG/0.4ML injection  Commonly known as:  LOVENOX  Inject 0.4 mLs (40 mg total) into the skin daily.     furosemide 40 MG tablet  Commonly known as:  LASIX  Take 40 mg by mouth daily.     glimepiride 1 MG tablet  Commonly known as:  AMARYL  Take 1 mg by mouth daily.      HYDROcodone-acetaminophen 5-325 MG per tablet  Commonly known as:  NORCO/VICODIN  Take 1-2 tablets by mouth every 4 (four) hours as needed for moderate pain.     isosorbide mononitrate 60 MG 24 hr tablet  Commonly known as:  IMDUR  Take 60 mg by mouth daily.     lisinopril 10 MG tablet  Commonly known as:  PRINIVIL,ZESTRIL  Take 10 mg by mouth at bedtime.     metoprolol succinate 50 MG 24 hr tablet  Commonly known as:  TOPROL-XL  Take 50 mg by mouth daily. Take with or immediately following a meal.     niacin 500 MG CR capsule  Take 500 mg by mouth at bedtime.     omeprazole 20 MG capsule  Commonly known as:  PRILOSEC  Take 20 mg by mouth daily.     oxybutynin 5 MG tablet  Commonly known as:  DITROPAN  Take 5 mg by mouth daily.     tamsulosin 0.4 MG Caps capsule  Commonly known as:  FLOMAX  Take 0.4 mg by mouth daily after supper.     vitamin C 500 MG tablet  Commonly known as:  ASCORBIC ACID  Take 500 mg by mouth at bedtime.     Vitamin D3 1000 UNITS Caps  Take 1,000 Units by mouth daily.     warfarin 5 MG tablet  Commonly known as:  COUMADIN  Take 0.5-1 tablets (2.5-5 mg total) by mouth at bedtime. As per pharmacist then may resume his routine of .Marland KitchenMarland KitchenTake 1 tablet (5 mg) on Saturday, take 1/2 tablet (2.5 mg) on Sunday through Friday        FOLLOW UP VISIT:       Follow-up Information   Follow up with [facility], PA-C. Schedule an appointment as soon as possible for a visit on 07/10/2013.   Specialty:  Orthopedic Surgery   Contact information:   [place] [phone]       DISPOSITION:   Skilled Nursing Facility/Rehab [facility] in [place]:  Stable   PETRARCA,BRIAN 06/28/2013, 8:55 AM

## 2013-06-28 NOTE — Clinical Social Work Note (Signed)
Patient for d/c today to SNF bed at [facility]. Patient agreeable to this plan- eager to get moved- will plan transfer via EMS. Eduard Clos, MSW, [place]

## 2013-07-11 ENCOUNTER — Telehealth: Payer: Self-pay | Admitting: Pharmacist Clinician (PhC)/ Clinical Pharmacy Specialist

## 2013-07-11 NOTE — Telephone Encounter (Signed)
Encounter complete. 

## 2013-07-12 ENCOUNTER — Ambulatory Visit: Payer: Medicare Other | Admitting: Pharmacist Clinician (PhC)/ Clinical Pharmacy Specialist

## 2013-07-16 ENCOUNTER — Ambulatory Visit (INDEPENDENT_AMBULATORY_CARE_PROVIDER_SITE_OTHER): Payer: Medicare Other | Admitting: Pharmacist

## 2013-07-16 DIAGNOSIS — Z7901 Long term (current) use of anticoagulants: Secondary | ICD-10-CM

## 2013-07-16 DIAGNOSIS — I482 Chronic atrial fibrillation, unspecified: Secondary | ICD-10-CM

## 2013-07-16 DIAGNOSIS — I4891 Unspecified atrial fibrillation: Secondary | ICD-10-CM

## 2013-07-16 LAB — POCT INR: INR: 3.9

## 2013-07-16 NOTE — Telephone Encounter (Signed)
Closed encounter °

## 2013-07-25 ENCOUNTER — Ambulatory Visit (INDEPENDENT_AMBULATORY_CARE_PROVIDER_SITE_OTHER): Payer: Medicare Other | Admitting: Pharmacist

## 2013-07-25 DIAGNOSIS — Z7901 Long term (current) use of anticoagulants: Secondary | ICD-10-CM

## 2013-07-25 DIAGNOSIS — I4891 Unspecified atrial fibrillation: Secondary | ICD-10-CM

## 2013-07-25 DIAGNOSIS — I482 Chronic atrial fibrillation, unspecified: Secondary | ICD-10-CM

## 2013-07-25 LAB — POCT INR: INR: 1.9

## 2013-07-25 NOTE — Progress Notes (Signed)
Can't close encounter .Marland Kitchen Needs return date.

## 2013-08-03 ENCOUNTER — Telehealth: Payer: Self-pay | Admitting: Internal Medicine

## 2013-08-03 NOTE — Telephone Encounter (Signed)
LEFT MESSAGE  - NOT SURE WHO CALLED- ? IF A MESSAGE WAS LEFT ON VOICE MAIL

## 2013-08-03 NOTE — Telephone Encounter (Signed)
Patient states that he received a call and does not know from who.

## 2013-08-08 ENCOUNTER — Ambulatory Visit (INDEPENDENT_AMBULATORY_CARE_PROVIDER_SITE_OTHER): Payer: Medicare Other | Admitting: Pharmacist Clinician (PhC)/ Clinical Pharmacy Specialist

## 2013-08-08 DIAGNOSIS — I482 Chronic atrial fibrillation, unspecified: Secondary | ICD-10-CM

## 2013-08-08 DIAGNOSIS — Z7901 Long term (current) use of anticoagulants: Secondary | ICD-10-CM

## 2013-08-08 DIAGNOSIS — I4891 Unspecified atrial fibrillation: Secondary | ICD-10-CM

## 2013-08-08 LAB — POCT INR: INR: 2.4

## 2013-09-05 ENCOUNTER — Ambulatory Visit (INDEPENDENT_AMBULATORY_CARE_PROVIDER_SITE_OTHER): Payer: Medicare Other | Admitting: Pharmacist Clinician (PhC)/ Clinical Pharmacy Specialist

## 2013-09-05 DIAGNOSIS — I482 Chronic atrial fibrillation, unspecified: Secondary | ICD-10-CM

## 2013-09-05 DIAGNOSIS — I4891 Unspecified atrial fibrillation: Secondary | ICD-10-CM

## 2013-09-05 DIAGNOSIS — Z7901 Long term (current) use of anticoagulants: Secondary | ICD-10-CM

## 2013-09-05 LAB — POCT INR: INR: 2.3

## 2013-09-14 ENCOUNTER — Other Ambulatory Visit (HOSPITAL_COMMUNITY): Payer: Self-pay | Admitting: Urology

## 2013-09-14 DIAGNOSIS — R972 Elevated prostate specific antigen [PSA]: Secondary | ICD-10-CM

## 2013-09-21 ENCOUNTER — Ambulatory Visit (HOSPITAL_COMMUNITY)
Admission: RE | Admit: 2013-09-21 | Discharge: 2013-09-21 | Disposition: A | Discharge status: Home / Self Care | Payer: Medicare Other | Source: Ambulatory Visit | Attending: Urology | Admitting: Urology

## 2013-09-21 ENCOUNTER — Encounter (HOSPITAL_COMMUNITY): Payer: Self-pay

## 2013-09-21 DIAGNOSIS — C61 Malignant neoplasm of prostate: Secondary | ICD-10-CM | POA: Insufficient documentation

## 2013-09-21 DIAGNOSIS — R972 Elevated prostate specific antigen [PSA]: Secondary | ICD-10-CM

## 2013-09-21 MED ORDER — FLUDEOXYGLUCOSE F - 18 (FDG) INJECTION
11.0000 | Freq: Once | INTRAVENOUS | Status: AC | PRN
  Administered 2013-09-21: 11 via INTRAVENOUS

## 2013-10-03 ENCOUNTER — Ambulatory Visit (INDEPENDENT_AMBULATORY_CARE_PROVIDER_SITE_OTHER): Payer: Medicare Other | Admitting: Pharmacist Clinician (PhC)/ Clinical Pharmacy Specialist

## 2013-10-03 DIAGNOSIS — I482 Chronic atrial fibrillation, unspecified: Secondary | ICD-10-CM

## 2013-10-03 DIAGNOSIS — I4891 Unspecified atrial fibrillation: Secondary | ICD-10-CM

## 2013-10-03 DIAGNOSIS — Z7901 Long term (current) use of anticoagulants: Secondary | ICD-10-CM

## 2013-10-03 LAB — POCT INR: INR: 2.3

## 2013-10-29 ENCOUNTER — Ambulatory Visit (INDEPENDENT_AMBULATORY_CARE_PROVIDER_SITE_OTHER): Payer: Medicare Other | Admitting: Pharmacist Clinician (PhC)/ Clinical Pharmacy Specialist

## 2013-10-29 DIAGNOSIS — I482 Chronic atrial fibrillation, unspecified: Secondary | ICD-10-CM

## 2013-10-29 DIAGNOSIS — Z7901 Long term (current) use of anticoagulants: Secondary | ICD-10-CM

## 2013-10-29 LAB — POCT INR: INR: 1.8

## 2013-11-12 ENCOUNTER — Ambulatory Visit (INDEPENDENT_AMBULATORY_CARE_PROVIDER_SITE_OTHER): Payer: Medicare Other | Admitting: Pharmacist Clinician (PhC)/ Clinical Pharmacy Specialist

## 2013-11-12 DIAGNOSIS — I482 Chronic atrial fibrillation, unspecified: Secondary | ICD-10-CM

## 2013-11-12 DIAGNOSIS — Z7901 Long term (current) use of anticoagulants: Secondary | ICD-10-CM

## 2013-11-12 LAB — POCT INR: INR: 2.6

## 2013-12-04 ENCOUNTER — Other Ambulatory Visit (HOSPITAL_COMMUNITY): Payer: Self-pay | Admitting: Otolaryngology

## 2013-12-04 DIAGNOSIS — D37039 Neoplasm of uncertain behavior of the major salivary glands, unspecified: Secondary | ICD-10-CM

## 2013-12-07 ENCOUNTER — Telehealth: Payer: Self-pay | Admitting: Internal Medicine

## 2013-12-07 NOTE — Telephone Encounter (Signed)
Will need lovenox bridging prior to the procedure (due to a-fib and history of DVT). Please coordinate with Erasmo Downer.  Dr. Debara Pickett

## 2013-12-07 NOTE — Telephone Encounter (Signed)
Greg Booker is calling because they need permission to stop [name] coumadin for a biospy . Please call    Thanks

## 2013-12-07 NOTE — Telephone Encounter (Signed)
Returned call to [employer] in radiology scheduling at [facility].She stated patient will be having a ultra sound biopsy.Patient will need to hold coumadin.Message sent to Dr.Hilty for advice.

## 2013-12-10 ENCOUNTER — Ambulatory Visit: Payer: Medicare Other | Admitting: Pharmacist Clinician (PhC)/ Clinical Pharmacy Specialist

## 2013-12-10 NOTE — Telephone Encounter (Signed)
Message routed to [place] to advise on Lovenox bridge

## 2013-12-10 NOTE — Telephone Encounter (Signed)
Spoke with Peggy at [facility], will try to schedule procedure for Dec 16.  Pt scheduled for INR check Fri Dec 4, will set lovenox bridge at that time.   Pt previously developed DVT when warfarin held, bridging necessary.

## 2013-12-14 ENCOUNTER — Ambulatory Visit (INDEPENDENT_AMBULATORY_CARE_PROVIDER_SITE_OTHER): Payer: Medicare Other | Admitting: Pharmacist Clinician (PhC)/ Clinical Pharmacy Specialist

## 2013-12-14 DIAGNOSIS — I482 Chronic atrial fibrillation, unspecified: Secondary | ICD-10-CM

## 2013-12-14 DIAGNOSIS — Z7901 Long term (current) use of anticoagulants: Secondary | ICD-10-CM

## 2013-12-14 NOTE — Patient Instructions (Signed)
Enoxaprin Dosing Schedule  Enoxparin dose:  Date  Warfarin Dose (evenings) Enoxaprin Dose  12-10 6 2.5mg   (1/2 tab)   12-11 5 0   12-12 4 0 8am    8pm  12-13 3 0 8am    8pm  12-14 2 0 8am    8pm  12-15 1 0 8am  12-16 Procedure 2.5mg   (1/2 tab)   12-17 1 5mg   (1 tab) 8am    8pm  12-18 2 5mg   (1 tab)5mg  8am    8pm  12-19 3 5mg   (1 tab) 8am    8pm  12-20 4 2.5mg   (1/2 tab) 8am    8pm  12-21 5     6

## 2013-12-19 ENCOUNTER — Other Ambulatory Visit: Payer: Self-pay | Admitting: Pharmacist Clinician (PhC)/ Clinical Pharmacy Specialist

## 2013-12-19 MED ORDER — ENOXAPARIN SODIUM 80 MG/0.8ML ~~LOC~~ SOLN: 80.0000 mg | Freq: Two times a day (BID) | SUBCUTANEOUS | Status: DC

## 2013-12-25 ENCOUNTER — Other Ambulatory Visit: Payer: Self-pay | Admitting: Radiology

## 2013-12-26 ENCOUNTER — Encounter (HOSPITAL_COMMUNITY): Payer: Self-pay

## 2013-12-26 ENCOUNTER — Ambulatory Visit (HOSPITAL_COMMUNITY)
Admission: RE | Admit: 2013-12-26 | Discharge: 2013-12-26 | Disposition: A | Discharge status: Home / Self Care | Payer: Medicare Other | Source: Ambulatory Visit | Attending: Otolaryngology | Admitting: Otolaryngology

## 2013-12-26 DIAGNOSIS — Z87891 Personal history of nicotine dependence: Secondary | ICD-10-CM | POA: Diagnosis not present

## 2013-12-26 DIAGNOSIS — E785 Hyperlipidemia, unspecified: Secondary | ICD-10-CM | POA: Insufficient documentation

## 2013-12-26 DIAGNOSIS — R351 Nocturia: Secondary | ICD-10-CM | POA: Diagnosis not present

## 2013-12-26 DIAGNOSIS — Z8546 Personal history of malignant neoplasm of prostate: Secondary | ICD-10-CM | POA: Insufficient documentation

## 2013-12-26 DIAGNOSIS — Z923 Personal history of irradiation: Secondary | ICD-10-CM | POA: Insufficient documentation

## 2013-12-26 DIAGNOSIS — E119 Type 2 diabetes mellitus without complications: Secondary | ICD-10-CM | POA: Insufficient documentation

## 2013-12-26 DIAGNOSIS — I2510 Atherosclerotic heart disease of native coronary artery without angina pectoris: Secondary | ICD-10-CM | POA: Diagnosis not present

## 2013-12-26 DIAGNOSIS — K118 Other diseases of salivary glands: Secondary | ICD-10-CM | POA: Insufficient documentation

## 2013-12-26 DIAGNOSIS — I10 Essential (primary) hypertension: Secondary | ICD-10-CM | POA: Insufficient documentation

## 2013-12-26 DIAGNOSIS — Z7901 Long term (current) use of anticoagulants: Secondary | ICD-10-CM | POA: Insufficient documentation

## 2013-12-26 DIAGNOSIS — R972 Elevated prostate specific antigen [PSA]: Secondary | ICD-10-CM | POA: Diagnosis not present

## 2013-12-26 DIAGNOSIS — I480 Paroxysmal atrial fibrillation: Secondary | ICD-10-CM | POA: Diagnosis not present

## 2013-12-26 DIAGNOSIS — Z79899 Other long term (current) drug therapy: Secondary | ICD-10-CM | POA: Diagnosis not present

## 2013-12-26 DIAGNOSIS — Z955 Presence of coronary angioplasty implant and graft: Secondary | ICD-10-CM | POA: Diagnosis not present

## 2013-12-26 DIAGNOSIS — K219 Gastro-esophageal reflux disease without esophagitis: Secondary | ICD-10-CM | POA: Insufficient documentation

## 2013-12-26 DIAGNOSIS — R3915 Urgency of urination: Secondary | ICD-10-CM | POA: Diagnosis not present

## 2013-12-26 DIAGNOSIS — D37039 Neoplasm of uncertain behavior of the major salivary glands, unspecified: Secondary | ICD-10-CM

## 2013-12-26 DIAGNOSIS — R591 Generalized enlarged lymph nodes: Secondary | ICD-10-CM | POA: Diagnosis not present

## 2013-12-26 LAB — CBC
HEMATOCRIT: 34.8 % — AB (ref 39.0–52.0)
Hemoglobin: 11.7 g/dL — ABNORMAL LOW (ref 13.0–17.0)
MCH: 32.7 pg (ref 26.0–34.0)
MCHC: 33.6 g/dL (ref 30.0–36.0)
MCV: 97.2 fL (ref 78.0–100.0)
Platelets: 257 10*3/uL (ref 150–400)
RBC: 3.58 MIL/uL — AB (ref 4.22–5.81)
RDW: 12.8 % (ref 11.5–15.5)
WBC: 8.3 10*3/uL (ref 4.0–10.5)

## 2013-12-26 LAB — PROTIME-INR
INR: 1.37 (ref 0.00–1.49)
Prothrombin Time: 17 seconds — ABNORMAL HIGH (ref 11.6–15.2)

## 2013-12-26 LAB — APTT: aPTT: 35 seconds (ref 24–37)

## 2013-12-26 MED ORDER — MIDAZOLAM HCL 2 MG/2ML IJ SOLN
INTRAMUSCULAR | Status: DC | PRN
  Administered 2013-12-26: 1 mg via INTRAVENOUS

## 2013-12-26 MED ORDER — LIDOCAINE HCL (PF) 1 % IJ SOLN
INTRAMUSCULAR | Status: AC
  Filled 2013-12-26: qty 10

## 2013-12-26 MED ORDER — FENTANYL CITRATE 0.05 MG/ML IJ SOLN
INTRAMUSCULAR | Status: DC | PRN
  Administered 2013-12-26: 50 ug via INTRAVENOUS
  Administered 2013-12-26: 25 ug via INTRAVENOUS

## 2013-12-26 MED ORDER — SODIUM CHLORIDE 0.9 % IV SOLN: Freq: Once | INTRAVENOUS | Status: DC

## 2013-12-26 MED ORDER — MIDAZOLAM HCL 2 MG/2ML IJ SOLN
INTRAMUSCULAR | Status: AC
  Filled 2013-12-26: qty 2

## 2013-12-26 MED ORDER — FENTANYL CITRATE 0.05 MG/ML IJ SOLN
INTRAMUSCULAR | Status: AC
  Filled 2013-12-26: qty 2

## 2013-12-26 NOTE — Procedures (Signed)
18g core R parotid nodule under [place], to surg path No complication No blood loss. See complete dictation in [facility].

## 2013-12-26 NOTE — H&P (Signed)
Chief Complaint: New increased PSA Hx prostate ca Rt parotid gland +PET   Referring Physician(s): Wolicki,Karol  History of Present Illness: Greg Booker is a 80 y.o. male   Pt with Hx prostate cancer +PET 09/21/2013: R parotid and L clavicle area and Abd LAN Has recent increase in PSA Now scheduled for R parotid biopsy per Dr Erik Obey Hx afib- on coumadin; last dose 02/21/13 Now bridging with Lovenox BID; Last dose 8am 12/25/13  Past Medical History  Diagnosis Date  . GERD (gastroesophageal reflux disease)   . Hyperlipidemia   . S/P primary angioplasty with coronary stent   . Calcified granuloma of lung RIGHT LUNG BASE - STABLE PER CXR 03-03-2010  . Mitral regurgitation   . Chronic back pain   . Sciatic nerve pain LEFT LEG PAIN--  EPI INJECTIONS  . Low back pain radiating to left leg   . Numbness and tingling of left leg   . Impaired hearing BILATERAL AIDS  . Nocturia   . Prostate cancer RECUR    S/P RADIATIVE SEEDS 2006  . Anticoagulated on Coumadin   . PAF (paroxysmal atrial fibrillation)   . Heart murmur   . Peripheral vascular disease     dvt lft leg 10 yrs ago  . Arthritis   . S/P radiation therapy   . Dysrhythmia   . Pneumonia 01/2013  . Diabetes mellitus without complication   . Coronary artery disease     Cardiologist Dr. Debara Pickett  . Hypertension   . Shortness of breath     with exertion  . Urgency of urination     Past Surgical History  Procedure Laterality Date  . Laminectomy  10-28-2008    L3 - 4  . Excision benign breast mass  08-06-2008    LEFT  . Cardioversion  09-15-2005    UNSUCCESSFUL  . Radioactive seed implant  08-03-2004    PROSTATE  . Cardiovascular stress test  06-03-2008    NORMAL STUDY/ LV NORMAL / COMPARED TO 12-26-2000 INFERIOR WALL ISCHEMIA IS NO LONGER PRESENT.  . Transthoracic echocardiogram  07-26-2007    LVSF NORMAL/ EF 55%/ LEFT ATRIUM 4.5CM MILD TO MOD. DILATED/ MILD  MITRAL & TRICUSPID REGURG  . Cryoablation   03/01/2011    Procedure: CRYO ABLATION PROSTATE;  Surgeon: Ailene Rud, MD;  Location: [facility];  Service: Urology;  Laterality: N/A;  . Coronary angioplasty with stent placement  1997- DR AL LITTLE    X1 STENT RCA  . Cardiac catheterization  1998    IN-STENT RESTENOSIS-- LARGE COLLATERALS  . Cardiac catheterization  2003  &  2006    2006 ---  MITRAL REGURG/ 100% OCCULSION RCA WITH LARGE COLLATERALS/ MILD DISEASE LAD & CIRCUMFLEX  . Total hip arthroplasty  11/02/2011    Procedure: TOTAL HIP ARTHROPLASTY;  Surgeon: Garald Balding, MD;  Location: [place];  Service: Orthopedics;  Laterality: Left;  Left Total Hip Replacement  . Joint replacement    . Lumbar laminectomy/decompression microdiscectomy Right 04/05/2013    Procedure: Right Lumbar three-four Laminectomy ;  Surgeon: Charlie Pitter, MD;  Location: [place] [facility];  Service: Neurosurgery;  Laterality: Right;  . Back surgery    . Total hip arthroplasty Right 06/26/2013    Procedure: TOTAL HIP ARTHROPLASTY;  Surgeon: Garald Balding, MD;  Location: [facility];  Service: Orthopedics;  Laterality: Right;    Allergies: Bicalutamide; Lovastatin; Niacin and related; Pravastatin; and Codeine  Medications: Prior to Admission medications   Medication  Sig Start Date End Date Taking? Authorizing Provider  b complex vitamins tablet Take 1 tablet by mouth daily.   Yes Historical Provider, MD  cetirizine (ZYRTEC) 10 MG tablet Take 10 mg by mouth daily.   Yes Historical Provider, MD  Cholecalciferol (VITAMIN D3) 1000 UNITS CAPS Take 1,000 Units by mouth daily.    Yes Historical Provider, MD  digoxin (LANOXIN) 0.125 MG tablet Take 0.125 mg by mouth daily.   Yes Historical Provider, MD  diltiazem (CARDIZEM CD) 240 MG 24 hr capsule Take 240 mg by mouth daily.  08/27/12  Yes Historical Provider, MD  enoxaparin (LOVENOX) 80 MG/0.8ML injection Inject 0.8 mLs (80 mg total) into the skin every 12 (twelve) hours. 12/19/13  Yes Pixie Casino, MD  ferrous sulfate 325 (65 FE) MG tablet Take 325 mg by mouth daily with breakfast.   Yes Historical Provider, MD  furosemide (LASIX) 40 MG tablet Take 40 mg by mouth daily.   Yes Historical Provider, MD  glimepiride (AMARYL) 1 MG tablet Take 1 mg by mouth daily. 02/15/13  Yes Historical Provider, MD  HYDROcodone-acetaminophen (NORCO/VICODIN) 5-325 MG per tablet Take 1-2 tablets by mouth every 4 (four) hours as needed for moderate pain. 06/28/13  Yes Biagio Borg, PA-C  isosorbide mononitrate (IMDUR) 60 MG 24 hr tablet Take 60 mg by mouth daily.    Yes Historical Provider, MD  lisinopril (PRINIVIL,ZESTRIL) 10 MG tablet Take 10 mg by mouth at bedtime.    Yes Historical Provider, MD  metoprolol succinate (TOPROL-XL) 50 MG 24 hr tablet Take 50 mg by mouth daily. Take with or immediately following a meal.   Yes Historical Provider, MD  MYRBETRIQ 50 MG TB24 tablet Take 50 mg by mouth daily. 09/12/13  Yes Historical Provider, MD  naproxen sodium (ANAPROX) 220 MG tablet Take 220 mg by mouth as needed.   Yes Historical Provider, MD  niacin 500 MG CR capsule Take 500 mg by mouth at bedtime.   Yes Historical Provider, MD  omeprazole (PRILOSEC) 20 MG capsule Take 20 mg by mouth daily. 05/18/12  Yes Historical Provider, MD  Saw [place], Serenoa repens, 450 MG CAPS Take 1 capsule by mouth daily.   Yes Historical Provider, MD  Tamsulosin HCl (FLOMAX) 0.4 MG CAPS Take 0.4 mg by mouth daily after supper.   Yes Historical Provider, MD  vitamin C (ASCORBIC ACID) 500 MG tablet Take 500 mg by mouth at bedtime.    Yes Historical Provider, MD  warfarin (COUMADIN) 5 MG tablet Take 0.5-1 tablets (2.5-5 mg total) by mouth at bedtime. As per pharmacist then may resume his routine of .Marland KitchenMarland KitchenTake 1 tablet (5 mg) on Saturday, take 1/2 tablet (2.5 mg) on Sunday through Friday 06/28/13   Biagio Borg, PA-C    Family History  Problem Relation Age of Onset  . Cancer Sister   . Heart Problems Father   . Heart Problems Brother      History   Social History  . Marital Status: Widowed    Spouse Name: N/A    Number of Children: N/A  . Years of Education: N/A   Social History Main Topics  . Smoking status: Former Smoker -- 1.00 packs/day for 25 years    Types: Cigarettes, Pipe    Quit date: 02/23/1972  . Smokeless tobacco: Never Used  . Alcohol Use: No  . Drug Use: No  . Sexual Activity: None   Other Topics Concern  . None   Social History Narrative     Review  of Systems: A 12 point ROS discussed and pertinent positives are indicated in the HPI above.  All other systems are negative.  Review of Systems  Constitutional: Negative for activity change, appetite change and unexpected weight change.  HENT: Negative for dental problem, drooling, trouble swallowing and voice change.   Respiratory: Negative for cough and shortness of breath.   Gastrointestinal: Negative for nausea and abdominal pain.  Musculoskeletal: Negative for gait problem and neck pain.  Psychiatric/Behavioral: Negative for behavioral problems and confusion.    Vital Signs: BP 134/74 mmHg  Pulse 43  Temp(Src) 98.1 F (36.7 C) (Oral)  Resp 20  Ht 5' 9.5" (1.765 m)  Wt 81.647 kg (180 lb)  BMI 26.21 kg/m2  SpO2 99%  Physical Exam  Constitutional: He is oriented to person, place, and time.  Cardiovascular: Normal rate, regular rhythm and normal heart sounds.   No murmur heard. Pulmonary/Chest: Effort normal and breath sounds normal. He has no wheezes.  Abdominal: Soft. Bowel sounds are normal. There is no tenderness.  Musculoskeletal: Normal range of motion.  Neurological: He is alert and oriented to person, place, and time.  Skin: Skin is warm and dry.  Psychiatric: He has a normal mood and affect. His behavior is normal. Judgment and thought content normal.    Imaging: No results found.  Labs:  CBC:  Recent Labs  06/26/13 1215 06/27/13 0500 06/28/13 0525 12/26/13 0856  WBC 11.4* 10.2 12.0* 8.3  HGB 11.3* 9.5*  9.7* 11.7*  HCT 32.5* 27.6* 28.4* 34.8*  PLT 242 206 236 257    COAGS:  Recent Labs  04/03/13 0900 04/05/13 0631  06/18/13 0938  06/26/13 0551  09/05/13 0819 10/03/13 0750 10/29/13 0806 11/12/13 0827  INR 1.47 1.21  < > 2.12*  < > 1.16  < > 2.3 2.3 1.8 2.6  APTT 51* 36  --  45*  --  35  --   --   --   --   --   < > = values in this interval not displayed.  BMP:  Recent Labs  04/03/13 0900 06/18/13 0938 06/26/13 1215 06/27/13 0500 06/28/13 0525  NA 146 141  --  138 141  K 3.5* 4.1  --  4.7 4.8  CL 106 102  --  97 101  CO2 25 24  --  23 25  GLUCOSE 102* 112*  --  153* 151*  BUN 21 28*  --  27* 30*  CALCIUM 9.2 9.6  --  8.8 9.2  CREATININE 1.24 1.31 1.26 1.42* 1.49*  GFRNONAA 53* 49* 51* 44* 42*  GFRAA 61* 57* 60* 52* 49*    LIVER FUNCTION TESTS: No results for input(s): BILITOT, AST, ALT, ALKPHOS, PROT, ALBUMIN in the last 8760 hours.  TUMOR MARKERS: No results for input(s): AFPTM, CEA, CA199, CHROMGRNA in the last 8760 hours.  Assessment and Plan:  Hx prostate ca +PET : R parotid; L clavicle and Abd LAN Recent rise in PSA Scheduled now for R parotid gland biopsy Pt aware of procedure benefits and risks and agreeable to proceed Consent signed andin chart  Thank you for this interesting consult.  I greatly enjoyed meeting Beauregard P Ponciano and look forward to participating in their care.    I spent a total of 20 minutes face to face in clinical consultation, greater than 50% of which was counseling/coordinating care for R parotid bx  Signed: Kerney Hopfensperger A 12/26/2013, 9:32 AM

## 2013-12-26 NOTE — Sedation Documentation (Signed)
Patient denies pain and is resting comfortably.  

## 2013-12-31 ENCOUNTER — Ambulatory Visit (INDEPENDENT_AMBULATORY_CARE_PROVIDER_SITE_OTHER): Payer: Medicare Other | Admitting: Pharmacist Clinician (PhC)/ Clinical Pharmacy Specialist

## 2013-12-31 DIAGNOSIS — I482 Chronic atrial fibrillation, unspecified: Secondary | ICD-10-CM

## 2013-12-31 DIAGNOSIS — Z7901 Long term (current) use of anticoagulants: Secondary | ICD-10-CM

## 2013-12-31 LAB — POCT INR: INR: 2.1

## 2014-01-28 ENCOUNTER — Ambulatory Visit (INDEPENDENT_AMBULATORY_CARE_PROVIDER_SITE_OTHER): Payer: Medicare Other | Admitting: Pharmacist Clinician (PhC)/ Clinical Pharmacy Specialist

## 2014-01-28 DIAGNOSIS — I482 Chronic atrial fibrillation, unspecified: Secondary | ICD-10-CM

## 2014-01-28 DIAGNOSIS — Z7901 Long term (current) use of anticoagulants: Secondary | ICD-10-CM

## 2014-01-28 LAB — POCT INR: INR: 2

## 2014-02-24 IMAGING — CT CT HIP*L* W/O CM
3 of 4 series · 14 of 32 positions shown, 19 images · non-contrast
Comparison: 09/17/2011

CLINICAL DATA: Avascular necrosis.  Hip pain.

CT OF THE LEFT HIP WITHOUT CONTRAST
TECHNIQUE: Multidetector CT imaging was performed according to the
standard protocol. Multiplanar CT image reconstructions were also
generated.

[Series 2: hip bone · axial · 0.55mm/px · z∈[-216,-180]mm · 2 of 102 slices shown]
[im 15/102  bone]
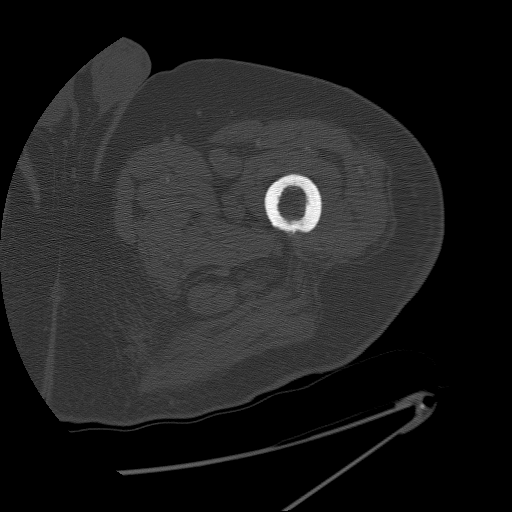
[im 29/102  bone]
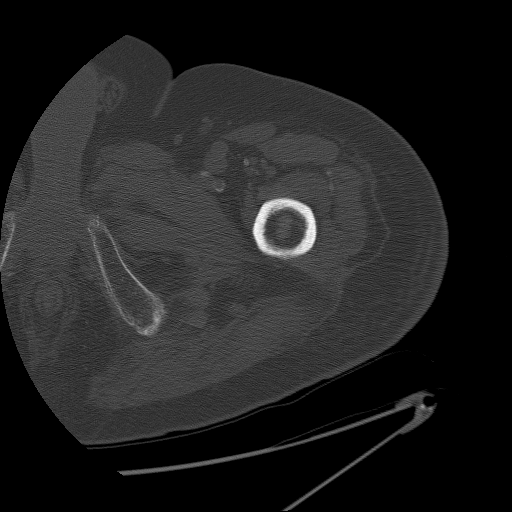

[Series 400: cor · coronal · 0.55mm/px · 6 of 107 slices shown, 11 images]
[im 16/107  soft-tissue]
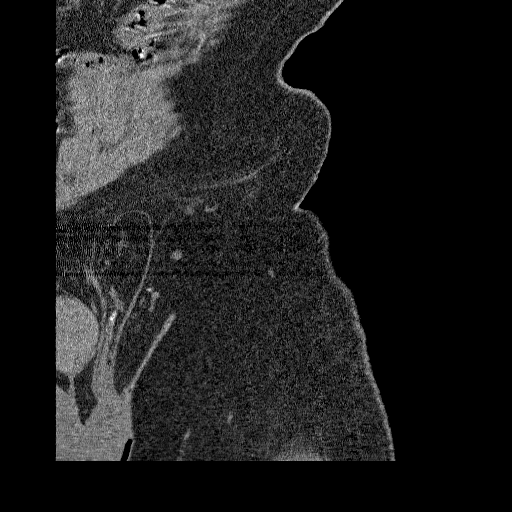
[im 16/107  lung]
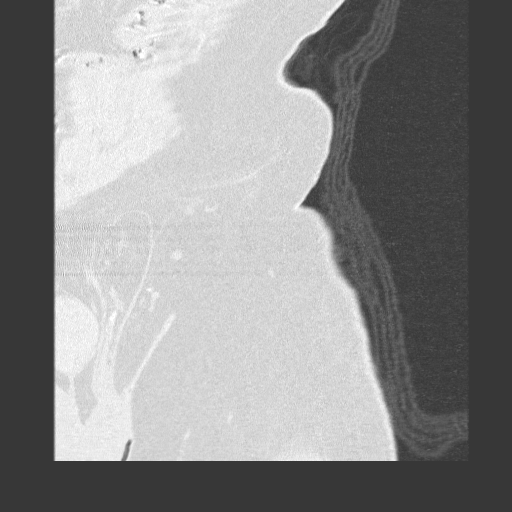
[im 16/107  bone]
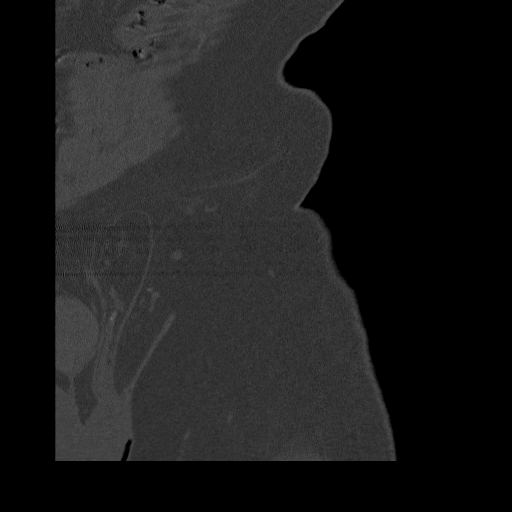
[im 31/107  soft-tissue]
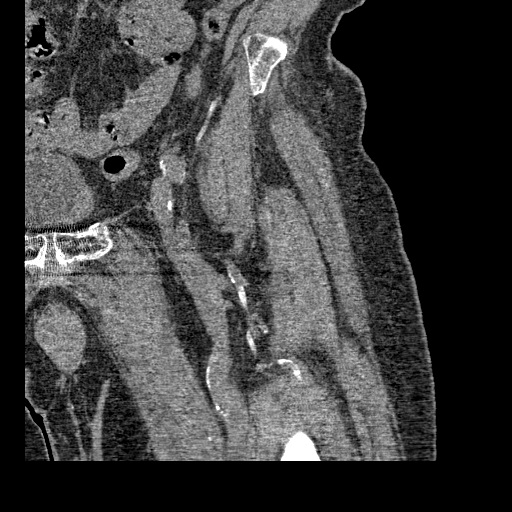
[im 31/107  lung]
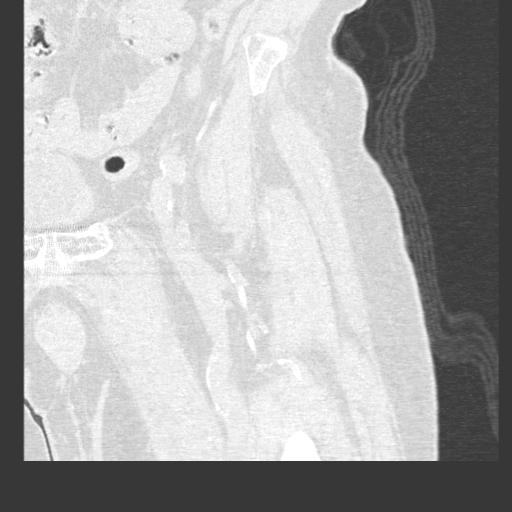
[im 46/107  soft-tissue]
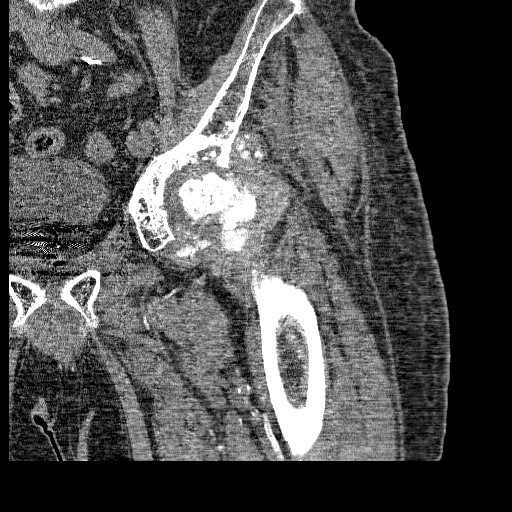
[im 46/107  lung]
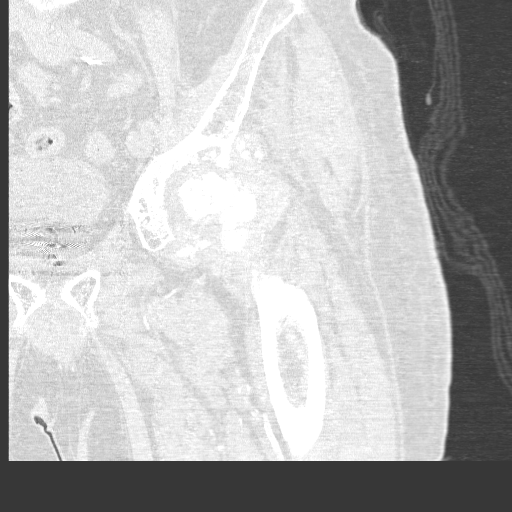
[im 61/107  soft-tissue]
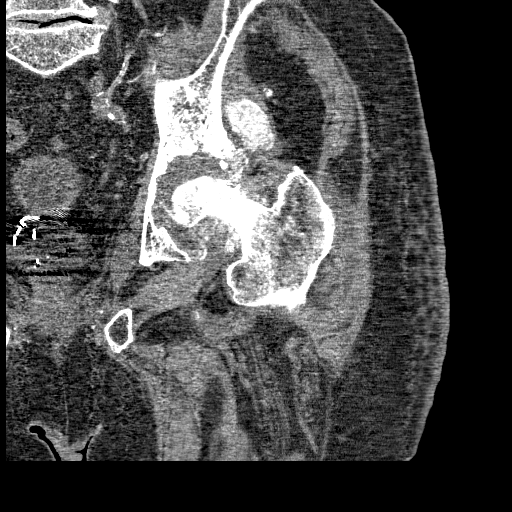
[im 61/107  lung]
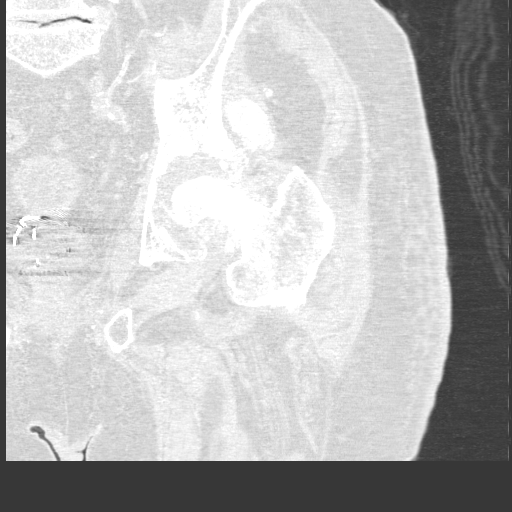
[im 76/107  soft-tissue]
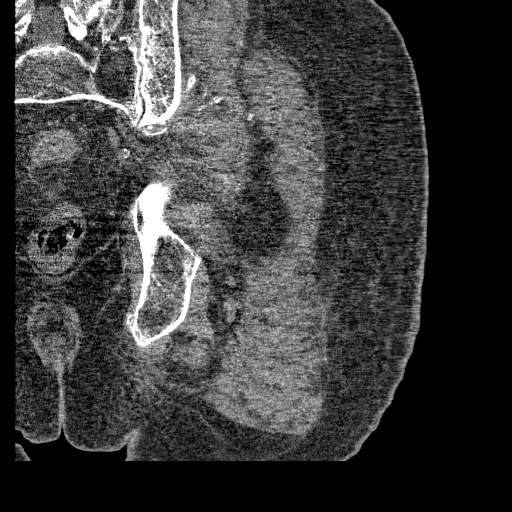
[im 91/107  soft-tissue]
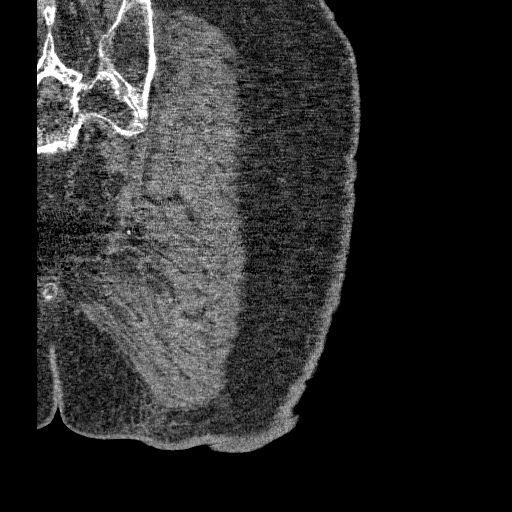

[Series 401: sag · sagittal · 0.55mm/px · 6 of 107 slices shown]
[im 16/107  soft-tissue]
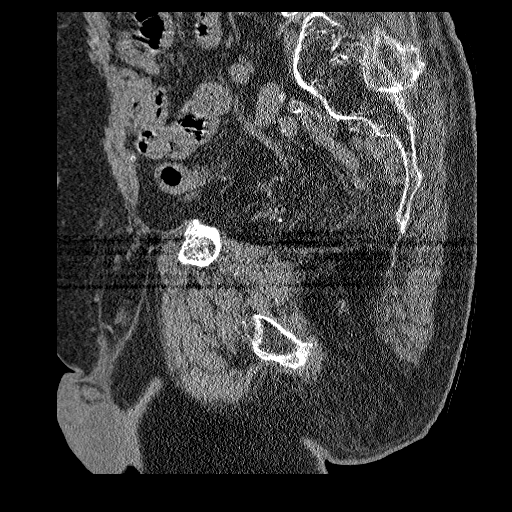
[im 31/107  soft-tissue]
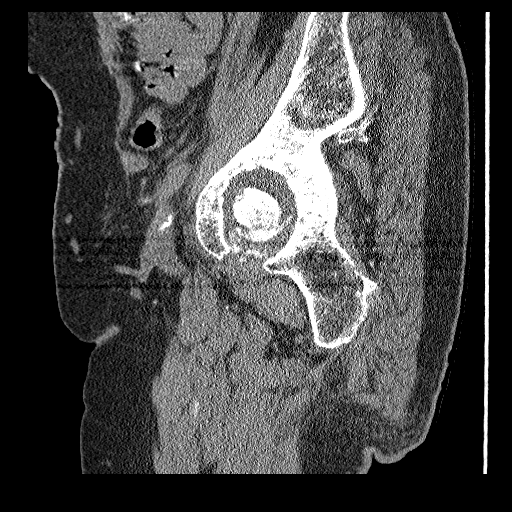
[im 46/107  soft-tissue]
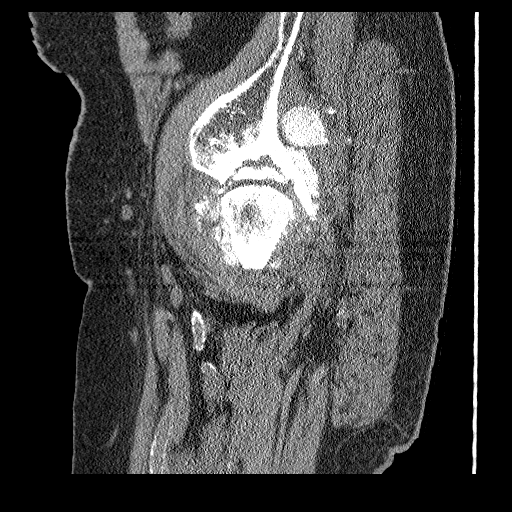
[im 61/107  soft-tissue]
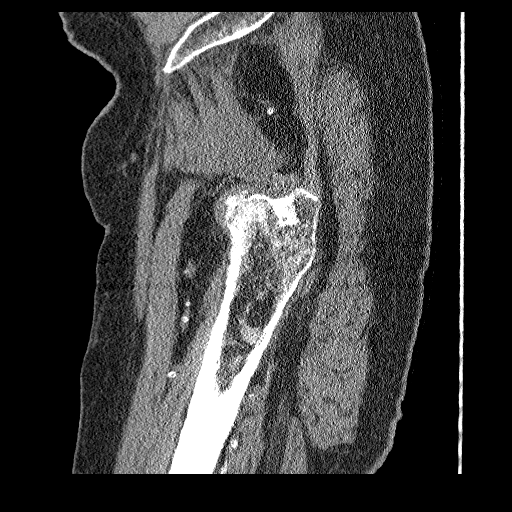
[im 76/107  soft-tissue]
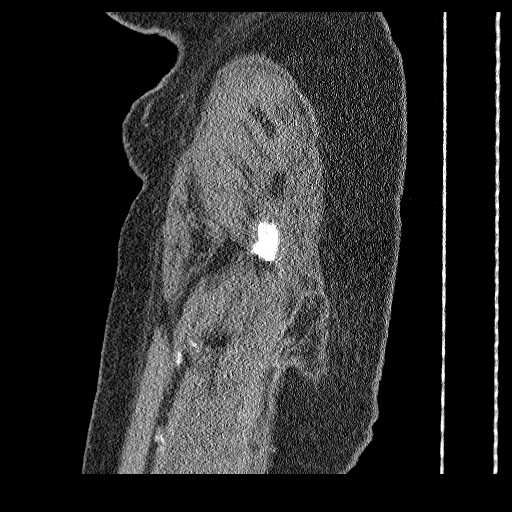
[im 91/107  soft-tissue]
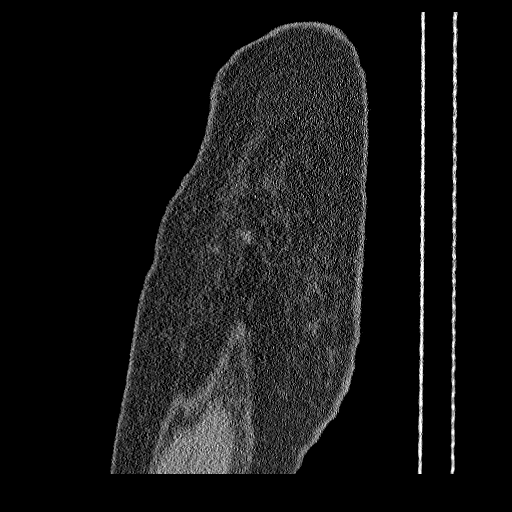

[14 of 32 positions shown; findings below may reference images not displayed]

FINDINGS: Extensive collapse of the left femoral head noted with
associated cortical irregularities and erosions in both the
acetabulum and the remaining femoral head, and several small
ossific fragments along these erosive cavities.  Considerable fluid
space noted, as on the prior MRI.  Irregular sclerosis along the
acetabulum and tracking into the adjacent left superior pubic ramus
noted.  Infection not excluded.  However, given the contralateral
AVN, much of the appearance may be secondary to AV and with
underlying erosive arthropathy.

There is also muscular in joint capsule calcification favoring
heterotopic ossification,  involving the gluteus minimus, biceps
femoris, iliopsoas, and joint capsule.  This heterotopic
ossification has a ground-glass or osteoid. Tumoral calcinosis in
the setting of renal failure might have a similar appearance
although heterotopic ossification is favored.
IMPRESSION: 1.  Prominent collapse of the femoral head and erosion of the
acetabulum, with small erosive cavities along the bony surfaces and
several fragments loose within the joint.
2.  Considerable heterotopic ossification along the joint capsule
and along regional musculature.
3.  Overall the degree of collapse appears similar to prior.
4.  Given the right at any irregular bony surfaces, especially
along the acetabulum, infection is not readily excluded, although
reportedly cultures have been negative.

## 2014-02-25 ENCOUNTER — Ambulatory Visit: Payer: Medicare Other | Admitting: Pharmacist Clinician (PhC)/ Clinical Pharmacy Specialist

## 2014-03-06 ENCOUNTER — Ambulatory Visit (INDEPENDENT_AMBULATORY_CARE_PROVIDER_SITE_OTHER): Payer: Medicare Other | Admitting: Pharmacist Clinician (PhC)/ Clinical Pharmacy Specialist

## 2014-03-06 DIAGNOSIS — Z7901 Long term (current) use of anticoagulants: Secondary | ICD-10-CM

## 2014-03-06 DIAGNOSIS — I482 Chronic atrial fibrillation, unspecified: Secondary | ICD-10-CM

## 2014-03-06 LAB — POCT INR: INR: 2.6

## 2014-04-03 ENCOUNTER — Ambulatory Visit (INDEPENDENT_AMBULATORY_CARE_PROVIDER_SITE_OTHER): Payer: Medicare Other | Admitting: Pharmacist Clinician (PhC)/ Clinical Pharmacy Specialist

## 2014-04-03 DIAGNOSIS — I482 Chronic atrial fibrillation, unspecified: Secondary | ICD-10-CM

## 2014-04-03 DIAGNOSIS — Z7901 Long term (current) use of anticoagulants: Secondary | ICD-10-CM | POA: Diagnosis not present

## 2014-04-03 LAB — POCT INR: INR: 2.3

## 2014-04-29 ENCOUNTER — Ambulatory Visit: Payer: Medicare Other | Admitting: Pharmacist Clinician (PhC)/ Clinical Pharmacy Specialist

## 2014-05-01 ENCOUNTER — Ambulatory Visit (INDEPENDENT_AMBULATORY_CARE_PROVIDER_SITE_OTHER): Payer: Medicare Other | Admitting: Pharmacist Clinician (PhC)/ Clinical Pharmacy Specialist

## 2014-05-01 DIAGNOSIS — I482 Chronic atrial fibrillation, unspecified: Secondary | ICD-10-CM

## 2014-05-01 DIAGNOSIS — Z7901 Long term (current) use of anticoagulants: Secondary | ICD-10-CM

## 2014-05-01 LAB — POCT INR: INR: 2.3

## 2014-05-30 ENCOUNTER — Ambulatory Visit (INDEPENDENT_AMBULATORY_CARE_PROVIDER_SITE_OTHER): Payer: Medicare Other | Admitting: Pharmacist Clinician (PhC)/ Clinical Pharmacy Specialist

## 2014-05-30 DIAGNOSIS — I482 Chronic atrial fibrillation, unspecified: Secondary | ICD-10-CM

## 2014-05-30 DIAGNOSIS — Z7901 Long term (current) use of anticoagulants: Secondary | ICD-10-CM

## 2014-05-30 LAB — POCT INR: INR: 2.7

## 2014-06-24 ENCOUNTER — Ambulatory Visit (INDEPENDENT_AMBULATORY_CARE_PROVIDER_SITE_OTHER): Payer: Medicare Other | Admitting: Pharmacist Clinician (PhC)/ Clinical Pharmacy Specialist

## 2014-06-24 DIAGNOSIS — Z7901 Long term (current) use of anticoagulants: Secondary | ICD-10-CM | POA: Diagnosis not present

## 2014-06-24 DIAGNOSIS — I482 Chronic atrial fibrillation, unspecified: Secondary | ICD-10-CM

## 2014-06-24 LAB — POCT INR: INR: 2.3

## 2014-07-04 DIAGNOSIS — M2022 Hallux rigidus, left foot: Secondary | ICD-10-CM

## 2014-07-04 DIAGNOSIS — M722 Plantar fascial fibromatosis: Secondary | ICD-10-CM | POA: Insufficient documentation

## 2014-07-04 DIAGNOSIS — M2021 Hallux rigidus, right foot: Secondary | ICD-10-CM | POA: Insufficient documentation

## 2014-07-22 ENCOUNTER — Ambulatory Visit (INDEPENDENT_AMBULATORY_CARE_PROVIDER_SITE_OTHER): Payer: Medicare Other | Admitting: Pharmacist Clinician (PhC)/ Clinical Pharmacy Specialist

## 2014-07-22 DIAGNOSIS — Z7901 Long term (current) use of anticoagulants: Secondary | ICD-10-CM | POA: Diagnosis not present

## 2014-07-22 DIAGNOSIS — I482 Chronic atrial fibrillation, unspecified: Secondary | ICD-10-CM

## 2014-07-22 LAB — POCT INR: INR: 3.2

## 2014-08-01 ENCOUNTER — Other Ambulatory Visit: Payer: Self-pay | Admitting: Pharmacist Clinician (PhC)/ Clinical Pharmacy Specialist

## 2014-08-01 MED ORDER — WARFARIN SODIUM 5 MG PO TABS: ORAL_TABLET | ORAL | Status: DC

## 2014-08-12 ENCOUNTER — Ambulatory Visit (INDEPENDENT_AMBULATORY_CARE_PROVIDER_SITE_OTHER): Payer: Medicare Other | Admitting: Pharmacist Clinician (PhC)/ Clinical Pharmacy Specialist

## 2014-08-12 DIAGNOSIS — Z7901 Long term (current) use of anticoagulants: Secondary | ICD-10-CM | POA: Diagnosis not present

## 2014-08-12 DIAGNOSIS — I482 Chronic atrial fibrillation, unspecified: Secondary | ICD-10-CM

## 2014-08-12 LAB — POCT INR: INR: 2.2

## 2014-09-09 ENCOUNTER — Ambulatory Visit (INDEPENDENT_AMBULATORY_CARE_PROVIDER_SITE_OTHER): Payer: Medicare Other | Admitting: Pharmacist Clinician (PhC)/ Clinical Pharmacy Specialist

## 2014-09-09 DIAGNOSIS — I482 Chronic atrial fibrillation, unspecified: Secondary | ICD-10-CM

## 2014-09-09 DIAGNOSIS — Z7901 Long term (current) use of anticoagulants: Secondary | ICD-10-CM | POA: Diagnosis not present

## 2014-09-09 LAB — POCT INR: INR: 2.6

## 2014-09-13 ENCOUNTER — Other Ambulatory Visit: Payer: Self-pay | Admitting: Orthopaedic Surgery

## 2014-09-13 DIAGNOSIS — I739 Peripheral vascular disease, unspecified: Secondary | ICD-10-CM

## 2014-09-13 DIAGNOSIS — I700 Atherosclerosis of aorta: Secondary | ICD-10-CM

## 2014-09-20 ENCOUNTER — Ambulatory Visit
Admission: RE | Admit: 2014-09-20 | Discharge: 2014-09-20 | Disposition: A | Discharge status: Home / Self Care | Payer: Medicare Other | Source: Ambulatory Visit | Attending: Orthopaedic Surgery | Admitting: Orthopaedic Surgery

## 2014-09-20 DIAGNOSIS — I700 Atherosclerosis of aorta: Secondary | ICD-10-CM

## 2014-09-20 DIAGNOSIS — I739 Peripheral vascular disease, unspecified: Secondary | ICD-10-CM

## 2014-10-07 ENCOUNTER — Ambulatory Visit (INDEPENDENT_AMBULATORY_CARE_PROVIDER_SITE_OTHER): Payer: Medicare Other | Admitting: Pharmacist Clinician (PhC)/ Clinical Pharmacy Specialist

## 2014-10-07 DIAGNOSIS — Z7901 Long term (current) use of anticoagulants: Secondary | ICD-10-CM | POA: Diagnosis not present

## 2014-10-07 DIAGNOSIS — I482 Chronic atrial fibrillation, unspecified: Secondary | ICD-10-CM

## 2014-10-07 LAB — POCT INR: INR: 2.3

## 2014-10-07 MED ORDER — WARFARIN SODIUM 5 MG PO TABS: ORAL_TABLET | ORAL | Status: DC

## 2014-11-04 ENCOUNTER — Ambulatory Visit (INDEPENDENT_AMBULATORY_CARE_PROVIDER_SITE_OTHER): Payer: Medicare Other | Admitting: Pharmacist Clinician (PhC)/ Clinical Pharmacy Specialist

## 2014-11-04 DIAGNOSIS — Z7901 Long term (current) use of anticoagulants: Secondary | ICD-10-CM | POA: Diagnosis not present

## 2014-11-04 DIAGNOSIS — I482 Chronic atrial fibrillation, unspecified: Secondary | ICD-10-CM

## 2014-11-04 LAB — POCT INR: INR: 1.6

## 2014-11-18 ENCOUNTER — Ambulatory Visit (INDEPENDENT_AMBULATORY_CARE_PROVIDER_SITE_OTHER): Payer: Medicare Other | Admitting: Pharmacist Clinician (PhC)/ Clinical Pharmacy Specialist

## 2014-11-18 DIAGNOSIS — Z7901 Long term (current) use of anticoagulants: Secondary | ICD-10-CM | POA: Diagnosis not present

## 2014-11-18 DIAGNOSIS — I482 Chronic atrial fibrillation, unspecified: Secondary | ICD-10-CM

## 2014-11-18 LAB — POCT INR: INR: 2.1

## 2014-12-16 ENCOUNTER — Ambulatory Visit (INDEPENDENT_AMBULATORY_CARE_PROVIDER_SITE_OTHER): Payer: Medicare Other | Admitting: Pharmacist Clinician (PhC)/ Clinical Pharmacy Specialist

## 2014-12-16 DIAGNOSIS — Z7901 Long term (current) use of anticoagulants: Secondary | ICD-10-CM | POA: Diagnosis not present

## 2014-12-16 DIAGNOSIS — I482 Chronic atrial fibrillation, unspecified: Secondary | ICD-10-CM

## 2014-12-16 LAB — POCT INR: INR: 1.7

## 2014-12-30 ENCOUNTER — Ambulatory Visit (INDEPENDENT_AMBULATORY_CARE_PROVIDER_SITE_OTHER): Payer: Medicare Other | Admitting: Pharmacist Clinician (PhC)/ Clinical Pharmacy Specialist

## 2014-12-30 ENCOUNTER — Encounter: Payer: Medicare Other | Admitting: Pharmacist Clinician (PhC)/ Clinical Pharmacy Specialist

## 2014-12-30 DIAGNOSIS — I482 Chronic atrial fibrillation, unspecified: Secondary | ICD-10-CM

## 2014-12-30 DIAGNOSIS — Z7901 Long term (current) use of anticoagulants: Secondary | ICD-10-CM | POA: Diagnosis not present

## 2014-12-30 LAB — POCT INR: INR: 2.4

## 2015-01-20 ENCOUNTER — Encounter: Payer: Medicare Other | Admitting: Pharmacist Clinician (PhC)/ Clinical Pharmacy Specialist

## 2015-01-22 ENCOUNTER — Ambulatory Visit (INDEPENDENT_AMBULATORY_CARE_PROVIDER_SITE_OTHER): Payer: Medicare Other | Admitting: Pharmacist Clinician (PhC)/ Clinical Pharmacy Specialist

## 2015-01-22 DIAGNOSIS — Z7901 Long term (current) use of anticoagulants: Secondary | ICD-10-CM

## 2015-01-22 DIAGNOSIS — I482 Chronic atrial fibrillation, unspecified: Secondary | ICD-10-CM

## 2015-01-22 LAB — POCT INR: INR: 3

## 2015-02-19 ENCOUNTER — Ambulatory Visit (INDEPENDENT_AMBULATORY_CARE_PROVIDER_SITE_OTHER): Payer: Medicare Other | Admitting: Pharmacist Clinician (PhC)/ Clinical Pharmacy Specialist

## 2015-02-19 DIAGNOSIS — I482 Chronic atrial fibrillation, unspecified: Secondary | ICD-10-CM

## 2015-02-19 DIAGNOSIS — Z7901 Long term (current) use of anticoagulants: Secondary | ICD-10-CM | POA: Diagnosis not present

## 2015-02-19 LAB — POCT INR: INR: 2

## 2015-03-01 ENCOUNTER — Other Ambulatory Visit: Payer: Self-pay | Admitting: Internal Medicine

## 2015-03-18 ENCOUNTER — Encounter: Payer: Medicare Other | Admitting: Pharmacist Clinician (PhC)/ Clinical Pharmacy Specialist

## 2015-03-20 ENCOUNTER — Ambulatory Visit (INDEPENDENT_AMBULATORY_CARE_PROVIDER_SITE_OTHER): Payer: Medicare Other | Admitting: Pharmacist Clinician (PhC)/ Clinical Pharmacy Specialist

## 2015-03-20 DIAGNOSIS — Z7901 Long term (current) use of anticoagulants: Secondary | ICD-10-CM

## 2015-03-20 DIAGNOSIS — I482 Chronic atrial fibrillation, unspecified: Secondary | ICD-10-CM

## 2015-03-20 LAB — POCT INR: INR: 2.4

## 2015-03-20 MED ORDER — WARFARIN SODIUM 5 MG PO TABS: ORAL_TABLET | ORAL | Status: DC

## 2015-03-25 ENCOUNTER — Telehealth: Payer: Self-pay | Admitting: Internal Medicine

## 2015-03-25 NOTE — Telephone Encounter (Signed)
Received records from [facility] for appointment on 04/09/15 with Dr Debara Pickett.  Records given to [facility] (medical records) for Dr Lysbeth Penner schedule on 04/09/15. lp

## 2015-04-09 ENCOUNTER — Encounter: Payer: Self-pay | Admitting: Internal Medicine

## 2015-04-09 ENCOUNTER — Ambulatory Visit (INDEPENDENT_AMBULATORY_CARE_PROVIDER_SITE_OTHER): Payer: Medicare Other | Admitting: Internal Medicine

## 2015-04-09 VITALS — BP 122/54 | HR 87 | Ht 68.0 in | Wt 190.0 lb

## 2015-04-09 DIAGNOSIS — Z7901 Long term (current) use of anticoagulants: Secondary | ICD-10-CM

## 2015-04-09 DIAGNOSIS — I482 Chronic atrial fibrillation, unspecified: Secondary | ICD-10-CM

## 2015-04-09 DIAGNOSIS — N189 Chronic kidney disease, unspecified: Secondary | ICD-10-CM

## 2015-04-09 DIAGNOSIS — N183 Chronic kidney disease, stage 3 unspecified: Secondary | ICD-10-CM

## 2015-04-09 DIAGNOSIS — I2510 Atherosclerotic heart disease of native coronary artery without angina pectoris: Secondary | ICD-10-CM | POA: Diagnosis not present

## 2015-04-09 NOTE — Patient Instructions (Signed)
Your physician has recommended you make the following change in your medication...  1. STOP digoxin  Your physician wants you to follow-up in: 1 year with Dr. Debara Pickett. You will receive a reminder letter in the mail two months in advance. If you don't receive a letter, please call our office to schedule the follow-up appointment.

## 2015-04-09 NOTE — Progress Notes (Signed)
OFFICE NOTE  Chief Complaint:  Routine follow-up  Primary Care Physician: Salena Saner., MD  HPI:  Greg Booker is an 82 year old gentleman followed by Dr. Rex Kras with history of coronary disease in the past. Cardiac catheterization in 2006 showed an occluded right coronary with collaterals, EF 50%. A stress test in 2010 most recently was negative for ischemia. He has had trouble with DVT and has been off Coumadin for periods of time. Recently he had hip surgery, which he underwent uneventfully and has done well and is back on his Coumadin. Unfortunately, his levels were too high; as of January 3 his INR was 6.9.  It now seems to be more regulated. He is describing no symptoms of bleeding, shortness of breath, palpitations, presyncope, syncopal symptoms, chest pain, or any associated cardiac findings.   Mr. Strathman returns today and is feeling quite well. Unfortunately a few weeks ago he developed a pneumonia and was treated for this. He is now almost completely recovered. Fortunately, his warfarin levels have remained therapeutic. His gotten back to his activities and is looking forward to going on a cruise for the next week.  I had the pleasure of seeing Mr. Alter back today in follow-up. He was last seen in 2015 and underwent a stress test which is negative for ischemia as part of a preoperative workup for hip replacement. He ultimately underwent hip replacement is doing very well. He denies any chest pain or worsening shortness of breath. I reviewed his medicines today noted that he is in chronic/permanent atrial fibrillation. His INR is a been stable and followed by Erasmo Downer, our anticoagulation pharmacist. He has been on long-term digoxin which was started by his previous cardiologist. He does have stage III chronic kidney disease. Recently there is data from a couple of very good studies which indicate that digoxin may increase the risk of mortality and that there was a clear cut off of  digoxin levels greater than 1.2 which wasn't associated with increased mortality, especially in patients with known coronary artery disease. He has not had a digoxin level tested in some time - it would be recommended that he has this testing done at least every 6 months.  PMHx:  Past Medical History  Diagnosis Date  . GERD (gastroesophageal reflux disease)   . Hyperlipidemia   . S/P primary angioplasty with coronary stent   . Calcified granuloma of lung ([place]) RIGHT LUNG BASE - STABLE PER CXR 03-03-2010  . Mitral regurgitation   . Chronic back pain   . Sciatic nerve pain LEFT LEG PAIN--  EPI INJECTIONS  . Low back pain radiating to left leg   . Numbness and tingling of left leg   . Impaired hearing BILATERAL AIDS  . Nocturia   . Prostate cancer ([place]) RECUR    S/P RADIATIVE SEEDS 2006  . Anticoagulated on Coumadin   . PAF (paroxysmal atrial fibrillation) ([place])   . Heart murmur   . Peripheral vascular disease ([place])     dvt lft leg 10 yrs ago  . Arthritis   . S/P radiation therapy   . Dysrhythmia   . Pneumonia 01/2013  . Diabetes mellitus without complication ([place])   . Coronary artery disease     Cardiologist Dr. Debara Pickett  . Hypertension   . Shortness of breath     with exertion  . Urgency of urination     Past Surgical History  Procedure Laterality Date  . Laminectomy  10-28-2008    L3 - 4  .  Excision benign breast mass  08-06-2008    LEFT  . Cardioversion  09-15-2005    UNSUCCESSFUL  . Radioactive seed implant  08-03-2004    PROSTATE  . Cardiovascular stress test  06-03-2008    NORMAL STUDY/ LV NORMAL / COMPARED TO 12-26-2000 INFERIOR WALL ISCHEMIA IS NO LONGER PRESENT.  . Transthoracic echocardiogram  07-26-2007    LVSF NORMAL/ EF 55%/ LEFT ATRIUM 4.5CM MILD TO MOD. DILATED/ MILD  MITRAL & TRICUSPID REGURG  . Cryoablation  03/01/2011    Procedure: CRYO ABLATION PROSTATE;  Surgeon: Ailene Rud, MD;  Location: [facility];  Service: Urology;   Laterality: N/A;  . Coronary angioplasty with stent placement  1997- DR AL LITTLE    X1 STENT RCA  . Cardiac catheterization  1998    IN-STENT RESTENOSIS-- LARGE COLLATERALS  . Cardiac catheterization  2003  &  2006    2006 ---  MITRAL REGURG/ 100% OCCULSION RCA WITH LARGE COLLATERALS/ MILD DISEASE LAD & CIRCUMFLEX  . Total hip arthroplasty  11/02/2011    Procedure: TOTAL HIP ARTHROPLASTY;  Surgeon: Garald Balding, MD;  Location: [place] [facility];  Service: Orthopedics;  Laterality: Left;  Left Total Hip Replacement  . Joint replacement    . Lumbar laminectomy/decompression microdiscectomy Right 04/05/2013    Procedure: Right Lumbar three-four Laminectomy ;  Surgeon: Charlie Pitter, MD;  Location: [facility];  Service: Neurosurgery;  Laterality: Right;  . Back surgery    . Total hip arthroplasty Right 06/26/2013    Procedure: TOTAL HIP ARTHROPLASTY;  Surgeon: Garald Balding, MD;  Location: [facility];  Service: Orthopedics;  Laterality: Right;    FAMHx:  Family History  Problem Relation Age of Onset  . Cancer Sister   . Heart Problems Father   . Heart Problems Brother     SOCHx:   reports that he quit smoking about 43 years ago. His smoking use included Cigarettes and Pipe. He has a 25 pack-year smoking history. He has never used smokeless tobacco. He reports that he does not drink alcohol or use illicit drugs.  ALLERGIES:  Allergies  Allergen Reactions  . Bicalutamide Other (See Comments)    Casodex caused increase in breast size  . Lovastatin Other (See Comments)    Leg weakness  . Niacin And Related Other (See Comments)    Burning and stinging from tablets (otc capsules ok)  . Pravastatin Other (See Comments)    myalgias  . Codeine Itching and Rash    ROS: Pertinent items noted in HPI and remainder of comprehensive ROS otherwise negative.  HOME MEDS: Current Outpatient Prescriptions  Medication Sig Dispense Refill  . b complex vitamins tablet Take 1 tablet by mouth daily.    .  bicalutamide (CASODEX) 50 MG tablet Take 50 mg by mouth daily.    . cetirizine (ZYRTEC) 10 MG tablet Take 10 mg by mouth daily.    . Cholecalciferol (VITAMIN D3) 1000 UNITS CAPS Take 1,000 Units by mouth daily.     Marland Kitchen diltiazem (CARDIZEM CD) 240 MG 24 hr capsule Take 240 mg by mouth daily.     . ferrous sulfate 325 (65 FE) MG tablet Take 325 mg by mouth daily with breakfast.    . furosemide (LASIX) 40 MG tablet Take 40 mg by mouth daily.    Marland Kitchen gabapentin (NEURONTIN) 100 MG capsule Take 1-3 capsules by mouth each night    . glimepiride (AMARYL) 1 MG tablet Take 1 mg by mouth daily.    Marland Kitchen  HYDROcodone-acetaminophen (NORCO/VICODIN) 5-325 MG per tablet Take 1-2 tablets by mouth every 4 (four) hours as needed for moderate pain. 30 tablet 0  . isosorbide mononitrate (IMDUR) 60 MG 24 hr tablet Take 60 mg by mouth daily.     Marland Kitchen lisinopril (PRINIVIL,ZESTRIL) 10 MG tablet Take 10 mg by mouth at bedtime.     . metoprolol succinate (TOPROL-XL) 50 MG 24 hr tablet Take 50 mg by mouth daily. Take with or immediately following a meal.    . MYRBETRIQ 50 MG TB24 tablet Take 50 mg by mouth daily.    . naproxen sodium (ANAPROX) 220 MG tablet Take 220 mg by mouth as needed.    . niacin 500 MG CR capsule Take 500 mg by mouth at bedtime.    Marland Kitchen omeprazole (PRILOSEC) 20 MG capsule Take 20 mg by mouth daily.    . Saw Palmetto, Serenoa repens, 450 MG CAPS Take 1 capsule by mouth daily.    . Tamsulosin HCl (FLOMAX) 0.4 MG CAPS Take 0.4 mg by mouth daily after supper.    . vitamin C (ASCORBIC ACID) 500 MG tablet Take 500 mg by mouth at bedtime.     . VOLTAREN 1 % GEL Apply as directed    . warfarin (COUMADIN) 5 MG tablet TAKE ONE-HALF TO ONE TABLET BY MOUTH ONCE DAILY AS DIRECTED BY COUMADIN CLINIC 90 tablet 1   No current facility-administered medications for this visit.    LABS/IMAGING: No results found for this or any previous visit (from the past 48 hour(s)). No results found.  VITALS: BP 122/54 mmHg  Pulse 87  Ht  5\' 8"  (1.727 m)  Wt 190 lb (86.183 kg)  BMI 28.90 kg/m2  EXAM: General appearance: alert and no distress Neck: no adenopathy, no carotid bruit, no JVD, supple, symmetrical, trachea midline and thyroid not enlarged, symmetric, no tenderness/mass/nodules Lungs: clear to auscultation bilaterally Heart: irregularly irregular rhythm Abdomen: soft, non-tender; bowel sounds normal; no masses,  no organomegaly Extremities: extremities normal, atraumatic, no cyanosis or edema Pulses: 2+ and symmetric Skin: Skin color, texture, turgor normal. No rashes or lesions Neurologic: Grossly normal  EKG: Atrial fibrillation with controlled ventricular response of 87  ASSESSMENT: 1. Chronic atrial fibrillation 2. Coronary artery disease with occluded right coronary left to right collaterals, EF 50% 3. Recent pneumonia 4. Hypertension 5. Dyslipidemia 6. Overweight  PLAN: 1.   Mr. Umstead is doing fairly well. His last stress test in 2015 was low risk for ischemia. His A. fib is rate controlled. His INRs therapeutic on warfarin. He wishes to continue with warfarin. As discussed above, I'm leery about him continuing on digoxin. I like to discontinue that today. He may need a small increase in metoprolol to compensate if his heart rate is consistently over 100. He does have a follow-up appointment with Erasmo Downer for an INR check in April. Hopefully she can review his heart rate at that time and if it's elevated, she may increase his Toprol-XL to 75 mg daily.  Follow-up with me annually or sooner as necessary.  Pixie Casino, MD, [facility] Attending Cardiologist [place] [facility] 04/09/2015, 10:24 AM

## 2015-04-14 ENCOUNTER — Telehealth: Payer: Self-pay | Admitting: Pharmacist Clinician (PhC)/ Clinical Pharmacy Specialist

## 2015-04-14 ENCOUNTER — Ambulatory Visit (INDEPENDENT_AMBULATORY_CARE_PROVIDER_SITE_OTHER): Payer: Medicare Other | Admitting: Pharmacist Clinician (PhC)/ Clinical Pharmacy Specialist

## 2015-04-14 DIAGNOSIS — Z7901 Long term (current) use of anticoagulants: Secondary | ICD-10-CM

## 2015-04-14 DIAGNOSIS — I482 Chronic atrial fibrillation, unspecified: Secondary | ICD-10-CM

## 2015-04-14 LAB — POCT INR: INR: 2.6

## 2015-04-14 NOTE — Telephone Encounter (Signed)
Patient was in office for INR check this morning.  Also checked his heart rate as Dr. Debara Pickett recently discontinued his digoxin.  Patient reports no feelings of racing heart or palpitations.  Of note he only stopped the digoxin about 2 days ago (he finished out what was in his weekly pill holder after it was discontinued.  Today his heart rate was 76.  Will continue to monitor HR at next INR visit.

## 2015-04-24 ENCOUNTER — Telehealth: Payer: Self-pay | Admitting: Internal Medicine

## 2015-04-24 NOTE — Telephone Encounter (Signed)
Received a call from PA with Dr Rudene Anda office  Patient in office today secondary to several recent falls from weakness from knees down He has had back surgery  PA stated patient has good strength and does not feel orthopedic issue Digoxin was d/c about 2 weeks ago  PA did not feel patient needed to be seen urgently Left message to call back, appointment scheduled Tuesday 04/29/15 for patient with Allie Bossier NP

## 2015-04-24 NOTE — Telephone Encounter (Signed)
Please call asap,pt is in the office.

## 2015-04-24 NOTE — Telephone Encounter (Signed)
Spoke with patient and made him aware of appointment

## 2015-04-29 ENCOUNTER — Encounter: Payer: Self-pay | Admitting: Nurse Practitioner

## 2015-04-29 ENCOUNTER — Ambulatory Visit: Payer: Medicare Other

## 2015-04-29 ENCOUNTER — Ambulatory Visit (INDEPENDENT_AMBULATORY_CARE_PROVIDER_SITE_OTHER): Payer: Medicare Other | Admitting: Nurse Practitioner

## 2015-04-29 VITALS — BP 156/74 | HR 56 | Ht 68.0 in | Wt 188.0 lb

## 2015-04-29 DIAGNOSIS — W19XXXA Unspecified fall, initial encounter: Secondary | ICD-10-CM | POA: Diagnosis not present

## 2015-04-29 DIAGNOSIS — I2510 Atherosclerotic heart disease of native coronary artery without angina pectoris: Secondary | ICD-10-CM | POA: Diagnosis not present

## 2015-04-29 DIAGNOSIS — R55 Syncope and collapse: Secondary | ICD-10-CM

## 2015-04-29 DIAGNOSIS — I482 Chronic atrial fibrillation, unspecified: Secondary | ICD-10-CM

## 2015-04-29 DIAGNOSIS — I119 Hypertensive heart disease without heart failure: Secondary | ICD-10-CM | POA: Insufficient documentation

## 2015-04-29 NOTE — Progress Notes (Signed)
Office Visit    Patient Name: Greg Booker Date of Encounter: 04/29/2015  Primary Care Provider:  Salena Saner., MD Primary Cardiologist:  C. Hilty, MD   Chief Complaint    82 year old male with a history of CAD, hypertension, hyperlipidemia, and chronic atrial fibrillation on Coumadin, who presents today for evaluation of falls and possible presyncope.  Past Medical History    Past Medical History  Diagnosis Date  . GERD (gastroesophageal reflux disease)   . Hyperlipidemia   . Calcified granuloma of lung ([place]) RIGHT LUNG BASE - STABLE PER CXR 03-03-2010  . Mitral regurgitation   . Chronic back pain   . Sciatic nerve pain LEFT LEG PAIN--  EPI INJECTIONS  . Low back pain radiating to left leg   . Numbness and tingling of left leg   . Impaired hearing BILATERAL AIDS  . Nocturia   . Prostate cancer ([place]) RECUR    a. 2006 S/P radioactive seed implant.  . Anticoagulated on Coumadin   . PAF (paroxysmal atrial fibrillation) ([facility])     a. CHA2DS2VASc = 5-->chronic coumadin.  Marland Kitchen Heart murmur   . History of DVT (deep vein thrombosis)     a. Early 2000's.  . Arthritis   . History of pneumonia 01/2013  . Diabetes mellitus without complication ([place])   . Coronary artery disease     a. 1997 PCI/BMS to the RCA; b. 2006 Cath: Occluded RCA with collat flow, EF 60%, otw nonobs dzs; b. 2010 MV: No ischemia; c. 06/2013 MV: low risk w/o ischemia.  . Hypertensive heart disease   . Urgency of urination   . Falls    Past Surgical History  Procedure Laterality Date  . Laminectomy  10-28-2008    L3 - 4  . Excision benign breast mass  08-06-2008    LEFT  . Cardioversion  09-15-2005    UNSUCCESSFUL  . Radioactive seed implant  08-03-2004    PROSTATE  . Cardiovascular stress test  06-03-2008    NORMAL STUDY/ LV NORMAL / COMPARED TO 12-26-2000 INFERIOR WALL ISCHEMIA IS NO LONGER PRESENT.  . Transthoracic echocardiogram  07-26-2007    LVSF NORMAL/ EF 55%/ LEFT ATRIUM 4.5CM MILD TO  MOD. DILATED/ MILD  MITRAL & TRICUSPID REGURG  . Cryoablation  03/01/2011    Procedure: CRYO ABLATION PROSTATE;  Surgeon: Ailene Rud, MD;  Location: [facility];  Service: Urology;  Laterality: N/A;  . Coronary angioplasty with stent placement  1997- DR AL LITTLE    X1 STENT RCA  . Cardiac catheterization  1998    IN-STENT RESTENOSIS-- LARGE COLLATERALS  . Cardiac catheterization  2003  &  2006    2006 ---  MITRAL REGURG/ 100% OCCULSION RCA WITH LARGE COLLATERALS/ MILD DISEASE LAD & CIRCUMFLEX  . Total hip arthroplasty  11/02/2011    Procedure: TOTAL HIP ARTHROPLASTY;  Surgeon: Garald Balding, MD;  Location: [facility];  Service: Orthopedics;  Laterality: Left;  Left Total Hip Replacement  . Joint replacement    . Lumbar laminectomy/decompression microdiscectomy Right 04/05/2013    Procedure: Right Lumbar three-four Laminectomy ;  Surgeon: Charlie Pitter, MD;  Location: [place] [facility];  Service: Neurosurgery;  Laterality: Right;  . Back surgery    . Total hip arthroplasty Right 06/26/2013    Procedure: TOTAL HIP ARTHROPLASTY;  Surgeon: Garald Balding, MD;  Location: [facility];  Service: Orthopedics;  Laterality: Right;    Allergies  Allergies  Allergen Reactions  . Bicalutamide Other (  See Comments)    Casodex caused increase in breast size  . Lovastatin Other (See Comments)    Leg weakness  . Niacin And Related Other (See Comments)    Burning and stinging from tablets (otc capsules ok)  . Pravastatin Other (See Comments)    myalgias  . Codeine Itching and Rash    History of Present Illness    82 year old male with the above complex past medical history. He has chronic atrial fibrillation and is on warfarin. He also has a history of CAD status post PCI and bare metal stenting of right coronary artery in 1997 with subsequent finding of in-stent restenosis and occlusion. He otherwise had nonobstructive CAD by his most recent catheterization in 2006. He had a low-risk  Myoview in June 2015. He was last seen in clinic on March 29, at which time his digoxin was discontinued secondary to advancing age and chronic kidney disease. Since then, he has had 4 episodes where he falls suddenly secondary to what he describes as sudden leg weakness. Initially he denied any presyncope or loss of consciousness, however on further questioning, he says that he is not sure if he loses consciousness as everything happened so quickly. He also says that he does feel lightheaded frequently throughout the day and thinks that these episodes have been preceded by lightheadedness. Following a fall, he says that he is alert and that it does take him a few seconds to gather himself and get up. He has not sustained any significant trauma. He was recently seen by orthopedics for routine follow-up and upon mentioning these symptoms, was referred to [place]. He has not been having any chest pain and denies any orthopnea, PND, palpitations, or early satiety.  Home Medications    Prior to Admission medications   Medication Sig Start Date End Date Taking? Authorizing Provider  b complex vitamins tablet Take 1 tablet by mouth daily.   Yes Historical Provider, MD  bicalutamide (CASODEX) 50 MG tablet Take 50 mg by mouth daily. 04/12/14  Yes Historical Provider, MD  cetirizine (ZYRTEC) 10 MG tablet Take 10 mg by mouth daily.   Yes Historical Provider, MD  Cholecalciferol (VITAMIN D3) 1000 UNITS CAPS Take 1,000 Units by mouth daily.    Yes Historical Provider, MD  diltiazem (CARDIZEM CD) 240 MG 24 hr capsule Take 240 mg by mouth daily.  08/27/12  Yes Historical Provider, MD  ferrous sulfate 325 (65 FE) MG tablet Take 325 mg by mouth daily with breakfast.   Yes Historical Provider, MD  furosemide (LASIX) 40 MG tablet Take 40 mg by mouth daily.   Yes Historical Provider, MD  gabapentin (NEURONTIN) 100 MG capsule Take 1-3 capsules by mouth each night 05/23/14  Yes Historical Provider, MD  glimepiride (AMARYL) 1 MG  tablet Take 1 mg by mouth daily. 02/15/13  Yes Historical Provider, MD  HYDROcodone-acetaminophen (NORCO/VICODIN) 5-325 MG per tablet Take 1-2 tablets by mouth every 4 (four) hours as needed for moderate pain. 06/28/13  Yes Mike Craze Petrarca, PA-C  isosorbide mononitrate (IMDUR) 60 MG 24 hr tablet Take 60 mg by mouth daily.    Yes Historical Provider, MD  lisinopril (PRINIVIL,ZESTRIL) 10 MG tablet Take 10 mg by mouth at bedtime.    Yes Historical Provider, MD  metoprolol succinate (TOPROL-XL) 50 MG 24 hr tablet Take 50 mg by mouth daily. Take with or immediately following a meal.   Yes Historical Provider, MD  MYRBETRIQ 50 MG TB24 tablet Take 50 mg by mouth daily. 09/12/13  Yes Historical Provider, MD  naproxen sodium (ANAPROX) 220 MG tablet Take 220 mg by mouth as needed.   Yes Historical Provider, MD  niacin 500 MG CR capsule Take 500 mg by mouth at bedtime.   Yes Historical Provider, MD  omeprazole (PRILOSEC) 20 MG capsule Take 20 mg by mouth daily. 05/18/12  Yes Historical Provider, MD  Saw Palmetto, Serenoa repens, 450 MG CAPS Take 1 capsule by mouth daily.   Yes Historical Provider, MD  Tamsulosin HCl (FLOMAX) 0.4 MG CAPS Take 0.4 mg by mouth daily after supper.   Yes Historical Provider, MD  vitamin C (ASCORBIC ACID) 500 MG tablet Take 500 mg by mouth at bedtime.    Yes Historical Provider, MD  VOLTAREN 1 % GEL Apply as directed 01/05/14  Yes Historical Provider, MD  warfarin (COUMADIN) 5 MG tablet TAKE ONE-HALF TO ONE TABLET BY MOUTH ONCE DAILY AS DIRECTED BY COUMADIN CLINIC 03/20/15  Yes Pixie Casino, MD    Review of Systems    As above, he has been having lightheadedness and falls with question of presyncope/syncope. He denies chest pain, dyspnea, PND, orthopnea, edema, or early satiety.  All other systems reviewed and are otherwise negative except as noted above.  Physical Exam    VS:  BP 156/74 mmHg  Pulse 56  Ht 5\' 8"  (1.727 m)  Wt 188 lb (85.276 kg)  BMI 28.59 kg/m2 , BMI Body mass  index is 28.59 kg/(m^2). GEN: Well nourished, well developed, in no acute distress. HEENT: normal. Neck: Supple, no JVD, carotid bruits, or masses. Cardiac: irregularly, irregular, 2/6 systolic murmur throughout, no rubs, or gallops. No clubbing, cyanosis, edema.  Radials/DP/PT 2+ and equal bilaterally.  Respiratory:  Respirations regular and unlabored, clear to auscultation bilaterally. GI: Soft, nontender, nondistended, BS + x 4. MS: no deformity or atrophy. Skin: warm and dry, no rash. Neuro:  Strength and sensation are intact. Psych: Normal affect.  Accessory Clinical Findings    ECG - AF, 77, LVH, de,ayed R progressoin, inflat ST dep, inf twi (more pronounced).  Assessment & Plan    1.  Recurrent Falls with presyncope/? Syncope:  As outlined above, since his last visit in March, he has had 4 episodes of feeling as though his legs become suddenly weak, preceded by lightheadedness, and resulting in a fall. He does not think he loses consciousness but says he is not completely sure. He is conscious that time he is on the ground however. He has not sustained any significant trauma. His blood pressure is stable and if anything mildly hypertensive today. Heart rate is stable at 77. He does have a systolic murmur. As it has been several years, I will follow-up a 2-D echocardiogram and also place a 30 day event monitor to assess for tachycardia or bradycardia arrhythmias that may be contributing to his symptoms.  2. Chronic atrial fibrillation: Rate remains well controlled despite discontinuation of digoxin in March. He remains on beta blocker and diltiazem and is anticoagulated with Coumadin and followed in our Coumadin clinic.  3. Coronary artery disease: He has not been having any chest pain. He has a known chronic total occlusion of the right coronary artery with nonischemic Myoview in 2015. He remains on nitrate and beta blocker. He is not on statin secondary to prior intolerance.  4.  Hypertensive heart disease: Blood pressure is elevated today. I'm reluctant to alter his medication at this time given lightheadedness and falls.  5. Hyperlipidemia:he is intolerant to statins. Could consider Zetia  or an injectable agent in the future. We do not have any lipids on record here.  6. Disposition: Follow-up echo and event monitor. Follow-up in the office in 4-6 weeks or sooner if monitoring shows significant findings.  Murray Hodgkins, NP 04/29/2015, 5:13 PM

## 2015-04-29 NOTE — Patient Instructions (Signed)
Medication Instructions: Ignacia Bayley, NP, recommends that you continue on your current medications as directed. Please refer to the Current Medication list given to you today.  Labwork: NONE  Testing/Procedures: 1. Echocardiogram - Your physician has requested that you have an echocardiogram. Echocardiography is a painless test that uses sound waves to create images of your heart. It provides your doctor with information about the size and shape of your heart and how well your heart's chambers and valves are working. This procedure takes approximately one hour. There are no restrictions for this procedure.  2. 30-Day Cardiac Event Monitor - Your physician has recommended that you wear an event monitor. Event monitors are medical devices that record the heart's electrical activity. Doctors most often [place] these monitors to diagnose arrhythmias. Arrhythmias are problems with the speed or rhythm of the heartbeat. The monitor is a small, portable device. You can wear one while you do your normal daily activities. This is usually used to diagnose what is causing palpitations/syncope (passing out).  Follow-up: [name] recommends that you schedule a follow-up appointment in 5-6 weeks with Dr Debara Pickett or a NP/PA.  If you need a refill on your cardiac medications before your next appointment, please call your pharmacy.

## 2015-05-12 ENCOUNTER — Ambulatory Visit (INDEPENDENT_AMBULATORY_CARE_PROVIDER_SITE_OTHER): Payer: Medicare Other | Admitting: Pharmacist

## 2015-05-12 DIAGNOSIS — I482 Chronic atrial fibrillation, unspecified: Secondary | ICD-10-CM

## 2015-05-12 DIAGNOSIS — Z7901 Long term (current) use of anticoagulants: Secondary | ICD-10-CM

## 2015-05-12 LAB — POCT INR: INR: 3.9

## 2015-05-12 NOTE — Addendum Note (Signed)
Addended by: Therisa Doyne on: 05/12/2015 05:38 PM   Modules accepted: Orders

## 2015-05-30 ENCOUNTER — Ambulatory Visit (HOSPITAL_COMMUNITY): Payer: Medicare Other | Attending: Cardiology

## 2015-05-30 ENCOUNTER — Other Ambulatory Visit: Payer: Self-pay

## 2015-05-30 DIAGNOSIS — I480 Paroxysmal atrial fibrillation: Secondary | ICD-10-CM | POA: Diagnosis not present

## 2015-05-30 DIAGNOSIS — I083 Combined rheumatic disorders of mitral, aortic and tricuspid valves: Secondary | ICD-10-CM | POA: Insufficient documentation

## 2015-05-30 DIAGNOSIS — E119 Type 2 diabetes mellitus without complications: Secondary | ICD-10-CM | POA: Diagnosis not present

## 2015-05-30 DIAGNOSIS — E785 Hyperlipidemia, unspecified: Secondary | ICD-10-CM | POA: Diagnosis not present

## 2015-05-30 DIAGNOSIS — I2510 Atherosclerotic heart disease of native coronary artery without angina pectoris: Secondary | ICD-10-CM | POA: Diagnosis not present

## 2015-05-30 DIAGNOSIS — I10 Essential (primary) hypertension: Secondary | ICD-10-CM | POA: Insufficient documentation

## 2015-05-30 DIAGNOSIS — R55 Syncope and collapse: Secondary | ICD-10-CM

## 2015-06-02 ENCOUNTER — Ambulatory Visit (INDEPENDENT_AMBULATORY_CARE_PROVIDER_SITE_OTHER): Payer: Medicare Other | Admitting: Pharmacist

## 2015-06-02 DIAGNOSIS — Z7901 Long term (current) use of anticoagulants: Secondary | ICD-10-CM

## 2015-06-02 DIAGNOSIS — I482 Chronic atrial fibrillation, unspecified: Secondary | ICD-10-CM

## 2015-06-02 LAB — POCT INR: INR: 3.1

## 2015-06-12 ENCOUNTER — Ambulatory Visit (INDEPENDENT_AMBULATORY_CARE_PROVIDER_SITE_OTHER): Payer: Medicare Other | Admitting: Pharmacist

## 2015-06-12 ENCOUNTER — Ambulatory Visit (INDEPENDENT_AMBULATORY_CARE_PROVIDER_SITE_OTHER): Payer: Medicare Other | Admitting: Internal Medicine

## 2015-06-12 ENCOUNTER — Encounter: Payer: Self-pay | Admitting: Internal Medicine

## 2015-06-12 VITALS — BP 128/56 | HR 80 | Ht 69.0 in | Wt 188.0 lb

## 2015-06-12 DIAGNOSIS — R2689 Other abnormalities of gait and mobility: Secondary | ICD-10-CM

## 2015-06-12 DIAGNOSIS — I119 Hypertensive heart disease without heart failure: Secondary | ICD-10-CM | POA: Diagnosis not present

## 2015-06-12 DIAGNOSIS — Z7901 Long term (current) use of anticoagulants: Secondary | ICD-10-CM

## 2015-06-12 DIAGNOSIS — I482 Chronic atrial fibrillation, unspecified: Secondary | ICD-10-CM

## 2015-06-12 DIAGNOSIS — R296 Repeated falls: Secondary | ICD-10-CM | POA: Diagnosis not present

## 2015-06-12 DIAGNOSIS — I25810 Atherosclerosis of coronary artery bypass graft(s) without angina pectoris: Secondary | ICD-10-CM | POA: Diagnosis not present

## 2015-06-12 LAB — POCT INR: INR: 1.8

## 2015-06-12 NOTE — Patient Instructions (Signed)
You have been referred to Dr. Carles Collet with [facility]   Your physician wants you to follow-up in: 6 months with Dr. Debara Pickett. You will receive a reminder letter in the mail two months in advance. If you don't receive a letter, please call our office to schedule the follow-up appointment.

## 2015-06-12 NOTE — Progress Notes (Signed)
OFFICE NOTE  Chief Complaint:  Routine follow-up  Primary Care Physician: Salena Saner., MD  HPI:  Greg Booker is an 82 year old gentleman followed by Dr. Rex Kras with history of coronary disease in the past. Cardiac catheterization in 2006 showed an occluded right coronary with collaterals, EF 50%. A stress test in 2010 most recently was negative for ischemia. He has had trouble with DVT and has been off Coumadin for periods of time. Recently he had hip surgery, which he underwent uneventfully and has done well and is back on his Coumadin. Unfortunately, his levels were too high; as of January 3 his INR was 6.9.  It now seems to be more regulated. He is describing no symptoms of bleeding, shortness of breath, palpitations, presyncope, syncopal symptoms, chest pain, or any associated cardiac findings.   Greg Booker returns today and is feeling quite well. Unfortunately a few weeks ago he developed a pneumonia and was treated for this. He is now almost completely recovered. Fortunately, his warfarin levels have remained therapeutic. His gotten back to his activities and is looking forward to going on a cruise for the next week.  I had the pleasure of seeing Greg Booker back today in follow-up. He was last seen in 2015 and underwent a stress test which is negative for ischemia as part of a preoperative workup for hip replacement. He ultimately underwent hip replacement is doing very well. He denies any chest pain or worsening shortness of breath. I reviewed his medicines today noted that he is in chronic/permanent atrial fibrillation. His INR is a been stable and followed by Erasmo Downer, our anticoagulation pharmacist. He has been on long-term digoxin which was started by his previous cardiologist. He does have stage III chronic kidney disease. Recently there is data from a couple of very good studies which indicate that digoxin may increase the risk of mortality and that there was a clear cut off of  digoxin levels greater than 1.2 which wasn't associated with increased mortality, especially in patients with known coronary artery disease. He has not had a digoxin level tested in some time - it would be recommended that he has this testing done at least every 6 months.  06/12/2015  Greg Booker was seen back today in follow-up. Recently he was seen by Ignacia Bayley, NP, for imbalance, dizziness and difficulty walking. He denied any true syncopal episodes. [name] ordered an echocardiogram and monitor. The monitor showed rate controlled A. fib without any significant pauses or tachyarrhythmias. The echocardiogram showed moderate aortic stenosis with normal LV function. Neither study indicated a clear etiology of his imbalance and dizziness. Concern is for his fall risk as he is on warfarin anticoagulation. Greg Booker does report he has a history of low back pain and neurosurgery.  PMHx:  Past Medical History  Diagnosis Date  . GERD (gastroesophageal reflux disease)   . Hyperlipidemia   . Calcified granuloma of lung ([place]) RIGHT LUNG BASE - STABLE PER CXR 03-03-2010  . Mitral regurgitation   . Chronic back pain   . Sciatic nerve pain LEFT LEG PAIN--  EPI INJECTIONS  . Low back pain radiating to left leg   . Numbness and tingling of left leg   . Impaired hearing BILATERAL AIDS  . Nocturia   . Prostate cancer ([place]) RECUR    a. 2006 S/P radioactive seed implant.  . Anticoagulated on Coumadin   . PAF (paroxysmal atrial fibrillation) ([facility])     a. CHA2DS2VASc = 5-->chronic coumadin.  Marland Kitchen Heart  murmur   . History of DVT (deep vein thrombosis)     a. Early 2000's.  . Arthritis   . History of pneumonia 01/2013  . Diabetes mellitus without complication ([place])   . Coronary artery disease     a. 1997 PCI/BMS to the RCA; b. 2006 Cath: Occluded RCA with collat flow, EF 60%, otw nonobs dzs; b. 2010 MV: No ischemia; c. 06/2013 MV: low risk w/o ischemia.  . Hypertensive heart disease   . Urgency of urination   .  Falls   . CKD (chronic kidney disease), stage III     Past Surgical History  Procedure Laterality Date  . Laminectomy  10-28-2008    L3 - 4  . Excision benign breast mass  08-06-2008    LEFT  . Cardioversion  09-15-2005    UNSUCCESSFUL  . Radioactive seed implant  08-03-2004    PROSTATE  . Cardiovascular stress test  06-03-2008    NORMAL STUDY/ LV NORMAL / COMPARED TO 12-26-2000 INFERIOR WALL ISCHEMIA IS NO LONGER PRESENT.  . Transthoracic echocardiogram  07-26-2007    LVSF NORMAL/ EF 55%/ LEFT ATRIUM 4.5CM MILD TO MOD. DILATED/ MILD  MITRAL & TRICUSPID REGURG  . Cryoablation  03/01/2011    Procedure: CRYO ABLATION PROSTATE;  Surgeon: Ailene Rud, MD;  [facility];  Service: Urology;  Laterality: N/A;  . Coronary angioplasty with stent placement  1997- DR AL LITTLE    X1 STENT RCA  . Cardiac catheterization  1998    IN-STENT RESTENOSIS-- LARGE COLLATERALS  . Cardiac catheterization  2003  &  2006    2006 ---  MITRAL REGURG/ 100% OCCULSION RCA WITH LARGE COLLATERALS/ MILD DISEASE LAD & CIRCUMFLEX  . Total hip arthroplasty  11/02/2011    Procedure: TOTAL HIP ARTHROPLASTY;  Surgeon: Garald Balding, MD;  Location: [place];  Service: Orthopedics;  Laterality: Left;  Left Total Hip Replacement  . Joint replacement    . Lumbar laminectomy/decompression microdiscectomy Right 04/05/2013    Procedure: Right Lumbar three-four Laminectomy ;  Surgeon: Charlie Pitter, MD;  Location: [facility];  Service: Neurosurgery;  Laterality: Right;  . Back surgery    . Total hip arthroplasty Right 06/26/2013    Procedure: TOTAL HIP ARTHROPLASTY;  Surgeon: Garald Balding, MD;  Location: [place];  Service: Orthopedics;  Laterality: Right;    FAMHx:  Family History  Problem Relation Age of Onset  . Cancer Sister   . Heart Problems Father   . Heart Problems Brother     SOCHx:   reports that he quit smoking about 43 years ago. His smoking use included Cigarettes and  Pipe. He has a 25 pack-year smoking history. He has never used smokeless tobacco. He reports that he does not drink alcohol or use illicit drugs.  ALLERGIES:  Allergies  Allergen Reactions  . Bicalutamide Other (See Comments)    Casodex caused increase in breast size  . Lovastatin Other (See Comments)    Leg weakness  . Niacin And Related Other (See Comments)    Burning and stinging from tablets (otc capsules ok)  . Pravastatin Other (See Comments)    myalgias  . Codeine Itching and Rash    ROS: Pertinent items noted in HPI and remainder of comprehensive ROS otherwise negative.  HOME MEDS: Current Outpatient Prescriptions  Medication Sig Dispense Refill  . b complex vitamins tablet Take 1 tablet by mouth daily.    . bicalutamide (CASODEX) 50 MG tablet Take  50 mg by mouth daily.    . cetirizine (ZYRTEC) 10 MG tablet Take 10 mg by mouth daily.    . Cholecalciferol (VITAMIN D3) 1000 UNITS CAPS Take 1,000 Units by mouth daily.     Marland Kitchen diltiazem (CARDIZEM CD) 240 MG 24 hr capsule Take 240 mg by mouth daily.     . ferrous sulfate 325 (65 FE) MG tablet Take 325 mg by mouth daily with breakfast.    . furosemide (LASIX) 40 MG tablet Take 40 mg by mouth daily.    Marland Kitchen gabapentin (NEURONTIN) 100 MG capsule Take 1-3 capsules by mouth each night    . glimepiride (AMARYL) 1 MG tablet Take 1 mg by mouth daily.    Marland Kitchen HYDROcodone-acetaminophen (NORCO/VICODIN) 5-325 MG per tablet Take 1-2 tablets by mouth every 4 (four) hours as needed for moderate pain. 30 tablet 0  . isosorbide mononitrate (IMDUR) 60 MG 24 hr tablet Take 60 mg by mouth daily.     Marland Kitchen lisinopril (PRINIVIL,ZESTRIL) 10 MG tablet Take 10 mg by mouth at bedtime.     . metoprolol succinate (TOPROL-XL) 50 MG 24 hr tablet Take 50 mg by mouth daily. Take with or immediately following a meal.    . MYRBETRIQ 50 MG TB24 tablet Take 50 mg by mouth daily.    . naproxen sodium (ANAPROX) 220 MG tablet Take 220 mg by mouth as needed.    . niacin 500 MG  CR capsule Take 500 mg by mouth at bedtime.    Marland Kitchen omeprazole (PRILOSEC) 20 MG capsule Take 20 mg by mouth daily.    . Saw Palmetto, Serenoa repens, 450 MG CAPS Take 1 capsule by mouth daily.    . Tamsulosin HCl (FLOMAX) 0.4 MG CAPS Take 0.4 mg by mouth daily after supper.    . vitamin C (ASCORBIC ACID) 500 MG tablet Take 500 mg by mouth at bedtime.     . VOLTAREN 1 % GEL Apply as directed    . warfarin (COUMADIN) 5 MG tablet TAKE ONE-HALF TO ONE TABLET BY MOUTH ONCE DAILY AS DIRECTED BY COUMADIN CLINIC 90 tablet 1   No current facility-administered medications for this visit.    LABS/IMAGING: No results found for this or any previous visit (from the past 48 hour(s)). No results found.  VITALS: BP 128/56 mmHg  Pulse 80  Ht 5\' 9"  (1.753 m)  Wt 188 lb (85.276 kg)  BMI 27.75 kg/m2  EXAM: General appearance: alert and no distress Lungs: clear to auscultation bilaterally Heart: regular rate and rhythm, S1, S2 normal and systolic murmur: systolic ejection 3/6, crescendo at 2nd right intercostal space Extremities: extremities normal, atraumatic, no cyanosis or edema Neurologic: Mental status: Alert, oriented, thought content appropriate  EKG: Deferred  ASSESSMENT: 1. Dizziness, imbalance, frequent falls 2. Chronic atrial fibrillation - CHADSVASC score of 5 (anticoagulated on warfarin) 3. Coronary artery disease with occluded right coronary left to right collaterals, EF 50% 4. Moderate aortic stenosis. 5. Recent pneumonia 6. Hypertension 7. Dyslipidemia 8. Overweight  PLAN: 1.   Greg Booker has been struggling with dizziness, imbalance and frequent falls. He is anticoagulated on warfarin however and this is concerning for obvious risks. His echo failed to show a cause of his imbalance however he does have moderate aortic stenosis. We will follow this by echo. His A. fib appears to be rate controlled and monitoring was unrevealing. I like to refer him to neurology for further workup of  his imbalance. He does have a history of low back  surgery in the past and some sciatic symptoms. This could be a posterior column problem or other etiologies for his imbalance such as neuropathy.   Pixie Casino, MD, [facility] Attending Cardiologist Jacona C Lior Cartelli 06/12/2015, 6:25 PM

## 2015-07-03 ENCOUNTER — Ambulatory Visit (INDEPENDENT_AMBULATORY_CARE_PROVIDER_SITE_OTHER): Payer: Medicare Other | Admitting: Pharmacist Clinician (PhC)/ Clinical Pharmacy Specialist

## 2015-07-03 DIAGNOSIS — Z7901 Long term (current) use of anticoagulants: Secondary | ICD-10-CM | POA: Diagnosis not present

## 2015-07-03 DIAGNOSIS — I482 Chronic atrial fibrillation, unspecified: Secondary | ICD-10-CM

## 2015-07-03 LAB — POCT INR: INR: 1.6

## 2015-07-03 NOTE — Progress Notes (Signed)
Patient would like to consider switching to Eliquis at next visit.  Will review dosing.

## 2015-07-18 ENCOUNTER — Ambulatory Visit (INDEPENDENT_AMBULATORY_CARE_PROVIDER_SITE_OTHER): Payer: Medicare Other | Admitting: Pharmacist

## 2015-07-18 DIAGNOSIS — I482 Chronic atrial fibrillation, unspecified: Secondary | ICD-10-CM

## 2015-07-18 DIAGNOSIS — Z7901 Long term (current) use of anticoagulants: Secondary | ICD-10-CM

## 2015-07-18 LAB — POCT INR: INR: 2.7

## 2015-07-25 ENCOUNTER — Ambulatory Visit: Payer: Medicare Other | Admitting: Neurology

## 2015-08-08 ENCOUNTER — Ambulatory Visit: Payer: Medicare Other | Admitting: Neurology

## 2015-08-14 ENCOUNTER — Ambulatory Visit (INDEPENDENT_AMBULATORY_CARE_PROVIDER_SITE_OTHER): Payer: Medicare Other | Admitting: Pharmacist

## 2015-08-14 DIAGNOSIS — I482 Chronic atrial fibrillation, unspecified: Secondary | ICD-10-CM

## 2015-08-14 DIAGNOSIS — Z7901 Long term (current) use of anticoagulants: Secondary | ICD-10-CM | POA: Diagnosis not present

## 2015-08-14 LAB — POCT INR: INR: 2.5

## 2015-09-18 ENCOUNTER — Ambulatory Visit (INDEPENDENT_AMBULATORY_CARE_PROVIDER_SITE_OTHER): Payer: Medicare Other | Admitting: Pharmacist

## 2015-09-18 DIAGNOSIS — Z7901 Long term (current) use of anticoagulants: Secondary | ICD-10-CM

## 2015-09-18 DIAGNOSIS — I482 Chronic atrial fibrillation, unspecified: Secondary | ICD-10-CM

## 2015-09-18 LAB — POCT INR: INR: 1.3

## 2015-09-24 ENCOUNTER — Other Ambulatory Visit (INDEPENDENT_AMBULATORY_CARE_PROVIDER_SITE_OTHER): Payer: Medicare Other

## 2015-09-24 ENCOUNTER — Ambulatory Visit (INDEPENDENT_AMBULATORY_CARE_PROVIDER_SITE_OTHER): Payer: Medicare Other | Admitting: Neurology

## 2015-09-24 ENCOUNTER — Encounter: Payer: Self-pay | Admitting: Neurology

## 2015-09-24 VITALS — BP 130/68 | HR 76 | Ht 69.0 in | Wt 193.5 lb

## 2015-09-24 DIAGNOSIS — M5417 Radiculopathy, lumbosacral region: Secondary | ICD-10-CM | POA: Diagnosis not present

## 2015-09-24 DIAGNOSIS — G609 Hereditary and idiopathic neuropathy, unspecified: Secondary | ICD-10-CM

## 2015-09-24 DIAGNOSIS — R278 Other lack of coordination: Secondary | ICD-10-CM

## 2015-09-24 DIAGNOSIS — R269 Unspecified abnormalities of gait and mobility: Secondary | ICD-10-CM | POA: Diagnosis not present

## 2015-09-24 LAB — FOLATE: FOLATE: 18.9 ng/mL (ref >5.9)

## 2015-09-24 LAB — SEDIMENTATION RATE: SED RATE: 53 mm/h — AB (ref 0–20)

## 2015-09-24 LAB — VITAMIN B12: VITAMIN B 12: 514 pg/mL (ref 211–911)

## 2015-09-24 LAB — TSH: TSH: 2.02 u[IU]/mL (ref 0.35–4.50)

## 2015-09-24 MED ORDER — GABAPENTIN 100 MG PO CAPS: ORAL_CAPSULE | ORAL | 5 refills | Status: DC

## 2015-09-24 NOTE — Progress Notes (Signed)
[facility] Clinic Note - Initial Visit   Date: 44/81/85  Greg Booker MRN: [id number] DOB: Jun 07, 1929   Dear Dr. Debara Pickett:  Thank you for your kind referral of [name] for consultation of imbalance. Although his history is well known to you, please allow [place] to reiterate it for the purpose of our medical record. The patient was accompanied to the clinic by friend who also provides collateral information.     History of Present Illness: Greg Booker is a 82 y.o. right-handed Caucasian male with CAD, atrial fibrillation on anticoagulation, stage III CKD, prostate cancer (2006) s/p radioactive seed implant, diabetes mellitus, s/p laminectomy at L3-4 (2010), lumbar laminectomy and microdiscectomy (2015),  bilateral total hip arthroplasty (L 2013, R 2015), hypertension, hyperlipidemia, and moderate aortic stenosis presenting for evaluation of dizziness, imbalance, and falls.    He starting having increased frequency of falls since the fall of 2016.  He has fallen 3-4 falls over the past year, with the last one about 6 months ago. There is no particular pattern to his falls.  One fall occurred while turning and another time he was going to sit on a chair and fell.  He does not have any prodromal signs prior to falling. He was diagnosed with diabetes about 1.5 years ago and has noticed new burning pain and numbness over the feet which has been present during the same time.  He also complains of left leg and back pain which is worse when walking.  He saw orthopeadics who referred him to Dr. Trenton Gammon, neurosurgery, who said there was nothing surgical to do.  He was evaluated by his cardiologist for these symptoms with echo which did not show any cause for his imbalance.  He has moderate aortic stenosis which will be followed.     Out-side paper records, electronic medical record, and images have been reviewed where available and summarized as:  MRI lumbar spine wo  contrast 08/05/2015: Multiple chronic compression fractures.  No acute fracture or metastatic disease. Lumbar spondylosis and facet degeneration, similar to the prior study.  Mild spinal stenosis at L1-2.  Moderate spinal stenosis at L2-3.  Mild spinal stenosis at L3-4. Bilateral pars defects of L5 with impingement of the L5 nerve root bilaterally, right greater than left.  Lab Results  Component Value Date   HGBA1C 6.0 (H) 06/26/2013     Past Medical History:  Diagnosis Date  . Anticoagulated on Coumadin   . Arthritis   . Calcified granuloma of lung ([place]) RIGHT LUNG BASE - STABLE PER CXR 03-03-2010  . Chronic back pain   . CKD (chronic kidney disease), stage III   . Coronary artery disease    a. 1997 PCI/BMS to the RCA; b. 2006 Cath: Occluded RCA with collat flow, EF 60%, otw nonobs dzs; b. 2010 MV: No ischemia; c. 06/2013 MV: low risk w/o ischemia.  . Diabetes mellitus without complication ([place])   . Falls   . GERD (gastroesophageal reflux disease)   . Heart murmur   . History of DVT (deep vein thrombosis)    a. Early 2000's.  Marland Kitchen History of pneumonia 01/2013  . Hyperlipidemia   . Hypertensive heart disease   . Impaired hearing BILATERAL AIDS  . Low back pain radiating to left leg   . Mitral regurgitation   . Nocturia   . Numbness and tingling of left leg   . PAF (paroxysmal atrial fibrillation) (HCC)    a. CHA2DS2VASc = 5-->chronic coumadin.  Marland Kitchen  Prostate cancer ([place]) RECUR   a. 2006 S/P radioactive seed implant.  . Sciatic nerve pain LEFT LEG PAIN--  EPI INJECTIONS  . Urgency of urination     Past Surgical History:  Procedure Laterality Date  . BACK SURGERY    . CARDIAC CATHETERIZATION  1998   IN-STENT RESTENOSIS-- LARGE COLLATERALS  . CARDIAC CATHETERIZATION  2003  &  2006   2006 ---  MITRAL REGURG/ 100% OCCULSION RCA WITH LARGE COLLATERALS/ MILD DISEASE LAD & CIRCUMFLEX  . CARDIOVASCULAR STRESS TEST  06-03-2008   NORMAL STUDY/ LV NORMAL / COMPARED TO 12-26-2000  INFERIOR WALL ISCHEMIA IS NO LONGER PRESENT.  Marland Kitchen CARDIOVERSION  09-15-2005   UNSUCCESSFUL  . CORONARY ANGIOPLASTY WITH STENT PLACEMENT  1997- DR AL LITTLE   X1 STENT RCA  . CRYOABLATION  03/01/2011   Procedure: CRYO ABLATION PROSTATE;  Surgeon: Ailene Rud, MD;  Location: [facility];  Service: Urology;  Laterality: N/A;  . EXCISION BENIGN BREAST MASS  08-06-2008   LEFT  . JOINT REPLACEMENT    . LAMINECTOMY  10-28-2008   L3 - 4  . LUMBAR LAMINECTOMY/DECOMPRESSION MICRODISCECTOMY Right 04/05/2013   Procedure: Right Lumbar three-four Laminectomy ;  Surgeon: Charlie Pitter, MD;  Location: [facility];  Service: Neurosurgery;  Laterality: Right;  . RADIOACTIVE SEED IMPLANT  08-03-2004   PROSTATE  . TOTAL HIP ARTHROPLASTY  11/02/2011   Procedure: TOTAL HIP ARTHROPLASTY;  Surgeon: Garald Balding, MD;  Location: [facility];  Service: Orthopedics;  Laterality: Left;  Left Total Hip Replacement  . TOTAL HIP ARTHROPLASTY Right 06/26/2013   Procedure: TOTAL HIP ARTHROPLASTY;  Surgeon: Garald Balding, MD;  Location: [facility];  Service: Orthopedics;  Laterality: Right;  . TRANSTHORACIC ECHOCARDIOGRAM  07-26-2007   LVSF NORMAL/ EF 55%/ LEFT ATRIUM 4.5CM MILD TO MOD. DILATED/ MILD  MITRAL & TRICUSPID REGURG     Medications:  Outpatient Encounter Prescriptions as of 09/24/2015  Medication Sig Note  . b complex vitamins tablet Take 1 tablet by mouth daily.   . bicalutamide (CASODEX) 50 MG tablet Take 50 mg by mouth daily. 05/01/2014: Received from: External Pharmacy Received Sig:   . cetirizine (ZYRTEC) 10 MG tablet Take 10 mg by mouth daily.   . Cholecalciferol (VITAMIN D3) 1000 UNITS CAPS Take 1,000 Units by mouth daily.    Marland Kitchen diltiazem (CARDIZEM CD) 240 MG 24 hr capsule Take 240 mg by mouth daily.    . ferrous sulfate 325 (65 FE) MG tablet Take 325 mg by mouth daily with breakfast.   . furosemide (LASIX) 40 MG tablet Take 40 mg by mouth daily.   Marland Kitchen gabapentin (NEURONTIN) 100 MG  capsule Take 1-3 capsules by mouth each night 05/30/2014: Received from: External Pharmacy  . glimepiride (AMARYL) 1 MG tablet Take 1 mg by mouth daily.   Marland Kitchen HYDROcodone-acetaminophen (NORCO/VICODIN) 5-325 MG per tablet Take 1-2 tablets by mouth every 4 (four) hours as needed for moderate pain.   . isosorbide mononitrate (IMDUR) 60 MG 24 hr tablet Take 60 mg by mouth daily.    Marland Kitchen lisinopril (PRINIVIL,ZESTRIL) 10 MG tablet Take 10 mg by mouth at bedtime.    . metoprolol succinate (TOPROL-XL) 50 MG 24 hr tablet Take 50 mg by mouth daily. Take with or immediately following a meal.   . MYRBETRIQ 50 MG TB24 tablet Take 50 mg by mouth daily.   . naproxen sodium (ANAPROX) 220 MG tablet Take 220 mg by mouth as needed.   . niacin 500  MG CR capsule Take 500 mg by mouth at bedtime.   Marland Kitchen omeprazole (PRILOSEC) 20 MG capsule Take 20 mg by mouth daily.   . Saw Palmetto, Serenoa repens, 450 MG CAPS Take 1 capsule by mouth daily.   . Tamsulosin HCl (FLOMAX) 0.4 MG CAPS Take 0.4 mg by mouth daily after supper.   . vitamin C (ASCORBIC ACID) 500 MG tablet Take 500 mg by mouth at bedtime.    . VOLTAREN 1 % GEL Apply as directed 01/28/2014: Received from: External Pharmacy  . warfarin (COUMADIN) 5 MG tablet TAKE ONE-HALF TO ONE TABLET BY MOUTH ONCE DAILY AS DIRECTED BY COUMADIN CLINIC   . [DISCONTINUED] digoxin ([place]) 0.125 MG tablet  09/24/2015: Received from: [facility]   No facility-administered encounter medications on file as of 09/24/2015.      Allergies:  Allergies  Allergen Reactions  . Bicalutamide Other (See Comments)    Casodex caused increase in breast size  . Lovastatin Other (See Comments)    Leg weakness  . Niacin And Related Other (See Comments)    Burning and stinging from tablets (otc capsules ok)  . Pravastatin Other (See Comments)    myalgias  . Codeine Itching and Rash    Family History: Family History  Problem Relation Age of Onset  . Arthritis Mother   . Other Mother 2  .  Heart Problems Father   . Heart disease Brother   . Breast cancer Sister   . Cancer Sister   . Heart Problems Brother     Social History: Social History  Substance Use Topics  . Smoking status: Former Smoker    Packs/day: 1.00    Years: 25.00    Types: Cigarettes, Pipe    Quit date: 02/23/1972  . Smokeless tobacco: Never Used  . Alcohol use No   Social History   Social History Narrative   Lives with male friend in a one story home with basement.  Has no children.  Retired.  Education: some college.     Review of Systems:  CONSTITUTIONAL: No fevers, chills, night sweats, or weight loss.   EYES: No visual changes or eye pain ENT: No hearing changes.  No history of nose bleeds.   RESPIRATORY: No cough, wheezing and shortness of breath.   CARDIOVASCULAR: Negative for chest pain, and palpitations.   GI: Negative for abdominal discomfort, blood in stools or black stools.  No recent change in bowel habits.   GU:  +history of incontinence.   MUSCLOSKELETAL: +history of joint pain or swelling.  No myalgias.   SKIN: Negative for lesions, rash, and itching.   HEMATOLOGY/ONCOLOGY: Negative for prolonged bleeding, bruising easily, and swollen nodes.  No history of cancer.   ENDOCRINE: Negative for cold or heat intolerance, polydipsia or goiter.   PSYCH:  No depression or anxiety symptoms.   NEURO: As Above.   Vital Signs:  BP 130/68   Pulse 76   Ht 5' 9" (1.753 m)   Wt 193 lb 8 oz (87.8 kg)   SpO2 97%   BMI 28.57 kg/m    General Medical Exam:   General:  Well appearing, comfortable.   Eyes/ENT: see cranial nerve examination.   Neck: No masses appreciated.  Full range of motion without tenderness.  No carotid bruits. Respiratory:  Clear to auscultation, good air entry bilaterally.   Cardiac:  Irregularly irregular rate and rhythm, systolic murmur.   Extremities:  No deformities, edema, or skin discoloration.  Skin:  No rashes or  lesions.  Neurological Exam: MENTAL STATUS  including orientation to time, place, person, recent and remote memory, attention span and concentration, language, and fund of knowledge is normal.  Speech is not dysarthric.  CRANIAL NERVES: II:  No visual field defects.  Limited fundoscopic exam due to small pupils   III-IV-VI: Pupils equal round and reactive to light.  Normal conjugate, extra-ocular eye movements in all directions of gaze.  No nystagmus.  No ptosis.   V:  Normal facial sensation.    VII:  Normal facial symmetry and movements.  No pathologic facial reflexes.  VIII:  Normal hearing and vestibular function.   IX-X:  Normal palatal movement.   XI:  Normal shoulder shrug and head rotation.   XII:  Normal tongue strength and range of motion, no deviation or fasciculation.  MOTOR:  No atrophy, fasciculations or abnormal movements.  No pronator drift.  Tone is normal.    Right Upper Extremity:    Left Upper Extremity:    Deltoid  5/5   Deltoid  5/5   Biceps  5/5   Biceps  5/5   Triceps  5/5   Triceps  5/5   Wrist extensors  5/5   Wrist extensors  5/5   Wrist flexors  5/5   Wrist flexors  5/5   Finger extensors  5/5   Finger extensors  5/5   Finger flexors  5/5   Finger flexors  5/5   Dorsal interossei  5/5   Dorsal interossei  5/5   Abductor pollicis  5/5   Abductor pollicis  5/5   Tone (Ashworth scale)  0  Tone (Ashworth scale)  0   Right Lower Extremity:    Left Lower Extremity:    Hip flexors  5/5   Hip flexors  4+/5   Adductors 5/5  Adductor 5-/5  Abductor 5/5  Abductor 5/5  Hip extensors  5/5   Hip extensors  5/5   Knee flexors  5/5   Knee flexors  5/5   Knee extensors  5/5   Knee extensors  5/5   Dorsiflexors  5/5   Dorsiflexors  5/5   Plantarflexors  5/5   Plantarflexors  5/5   Toe extensors  5/5   Toe extensors  5/5   Toe flexors  5/5   Toe flexors  5/5   Tone (Ashworth scale)  0  Tone (Ashworth scale)  0   MSRs:  Right                                                                 Left brachioradialis  2+  brachioradialis 2+  biceps 2+  biceps 2+  triceps 2+  triceps 2+  patellar 3+  patellar 3+  ankle jerk 0  ankle jerk 0  Hoffman no  Hoffman no  plantar response down  plantar response down   SENSORY:  Pin prick and vibration is absent distal to ankles bilaterally.  Proprioception is also impaired.  There is mild sway with Rhomberg testing.  COORDINATION/GAIT: Normal finger-to- nose-finger.  Intact rapid alternating movements bilaterally.   Gait slightly wide-based and stable. He is unable to perform tandem or stressed gait.   IMPRESSION: 1.  Distal and symmetric polyneuropathy causing bilateral feel paresthesias and sensory ataxia, possibly  contributed by diabetes but overall diabetes is very well controlled 2.  Lumbosacral radiculopathy causing left hip flexion weakness and hyperreflexia of the legs 3.  Multifactorial gait instability due to #1 and #2.  With him on anticoagulation for atrial fibrillation, the risk of a severe fall causing intracranial bleed is present.  Fortunately, patient has not had any falls in the past 6 months and attributes this to being much more cautious and careful.   PLAN/RECOMMENDATIONS:  1.  Check ESR, vitamin B12, folate, vitamin B1, TSH, SPEP with IFE 2.  NCS/EMG of the legs - avoid deep muscle due to him being on coumadin 3.  Start physical therapy for gait training 4.  Start gabapentin 113m at bedtime for one week, then increase to 1044mtwice daily 5.  Strongly recommended using a cane 6.  Fall precautions discussed and literature provided  Return to clinic in 4-6 months.   The duration of this appointment visit was 60 minutes of face-to-face time with the patient.  Greater than 50% of this time was spent in counseling, explanation of diagnosis, planning of further management, and coordination of care.   Thank you for allowing me to participate in patient's care.  If I can answer any additional questions, I would be pleased to do so.     Sincerely,    Konstantin Lehnen K. PaPosey ProntoDO

## 2015-09-24 NOTE — Progress Notes (Signed)
Note routed

## 2015-09-24 NOTE — Patient Instructions (Addendum)
1.  Check blood work 2.  NCS/EMG of the legs 3.  Start physical therapy at [facility]  4.  Start gabapentin 100mg  at bedtime for one week, then increase to 100mg  twice daily 5.  Start using a cane  Return to clinic 4-6 months  [facility]  Preventing Falls in the [place] are common, often dreaded events in the lives of older people. Aside from the obvious injuries and even death that may result, falls can cause wide-ranging consequences including loss of independence, mental decline, decreased activity, and mobility. Younger people are also at risk of falling, especially those with chronic illnesses and fatigue.  Ways to reduce the risk for falling:  * Examine diet and medications. Warm foods and alcohol dilate blood vessels, which can lead to dizziness when standing. Sleep aids, antidepressants, and pain medications can also increase the likelihood of a fall.  * Get a vison exam. Poor vision, cataracts, and glaucoma increase the chances of falling.  * Check foot gear. Shoes should fit snugly and have a sturdy, nonskid sole and broad, low heel.  * Participate in a physician-approved exercise program to build and maintain muscle strength and improve balance and coordination.  * Increase vitamin D intake. Vitamin D improves muscle strength and increases the amount of calcium the body is able to absorb and deposit in bones.  How to prevent falls from common hazards:  * Floors - Remove all loose wires, cords, and throw rugs. Minimize clutter. Make sure rugs are anchored and smooth. Keep furniture in its usual place.  * Chairs - Use chairs with straight backs, armrests, and firm seats. Add firm cushions to existing pieces to add height.  * Bathroom - Install grab bars and non-skid tape in the tub or shower. Use a bathtub transfer bench or a shower chair with a back support. Use an elevated toilet seat and/or safety rails to assist standing from a low surface. Do not use towel racks or  bathroom tissue holders to help you stand.  * Lighting - Make sure halls, stairways, and entrances are well-lit. Install a night light in your bathroom or hallway. Make sure there is a light switch at the top and bottom of the staircase. Turn lights on if you get up in the middle of the night. Make sure lamps or light switches are within reach of the bed if you have to get up during the night.  * Kitchen - Install non-skid rubber mats near the sink and stove. Clean spills immediately. Store frequently used utensils, pots, and pans between waist and eye level. This helps prevent reaching and bending. Sit when getting things out of the lower cupboards.  * Living room / [place] furniture with wide spaces in between, giving enough room to move around. Establish a route through the living room that gives you something to hold onto as you walk.  * Stairs - Make sure treads, rails, and rugs are secure. Install a rail on both sides of the stairs. If stairs are a threat, it might be helpful to arrange most of your activities on the lower level to reduce the number of times you must climb the stairs.  * Entrances and doorways - Install metal handles on the walls adjacent to the doorknobs of all doors to make it more secure as you travel through the doorway.  Tips for maintaining balance:  * Keep at least one hand free at all times Try using a backpack  or fanny pack to hold things rather than carrying them in your hands. Never carry objects in both hands when walking as this interferes with keeping your balance.  * Attempt to swing both arms from front to back while walking. This might require a conscious effort if Parkinson's disease has diminished your movement. It will, however, help you to maintain balance and posture, and reduce fatigue.  * Consciously lift your feet off the ground when walking. Shuffling and dragging of the feet is a common culprit in losing your balance.  * When trying to navigate  turns, use a "U" technique of facing forward and making a wide turn, rather than pivoting sharply.  * Try to stand with your feet shoulder-length apart. When your feet are close together for any length of time, you increase your risk of losing your balance and falling.  * Do one thing at a time. Do not try to walk and accomplish another task, such as reading or looking around. The decrease in your automatic reflexes complicates motor function, so the less distraction, the better.  * Do not wear rubber or gripping soled shoes, they might "catch" on the floor and cause tripping.  * Move slowly when changing positions. Use deliberate, concentrated movements and, if needed, use a grab bar or walking aid. Count fifteen (15) seconds after standing to begin walking.  * If balance is a continuous problem, you might want to consider a walking aid such as a cane, walking stick, or walker. Once you have mastered walking with help, you may be ready to try it again on your own.  This information is provided by [facility] and is not intended to replace the medical advice of your physician or other health care providers. Please consult your physician or other health care providers for advice regarding your specific medical condition.

## 2015-09-26 LAB — PROTEIN ELECTROPHORESIS, SERUM
ALPHA-1-GLOBULIN: 0.3 g/dL (ref 0.2–0.3)
ALPHA-2-GLOBULIN: 0.8 g/dL (ref 0.5–0.9)
Albumin ELP: 3.6 g/dL — ABNORMAL LOW (ref 3.8–4.8)
BETA 2: 0.5 g/dL (ref 0.2–0.5)
Beta Globulin: 0.4 g/dL (ref 0.4–0.6)
GAMMA GLOBULIN: 1.3 g/dL (ref 0.8–1.7)
TOTAL PROTEIN, SERUM ELECTROPHOR: 6.8 g/dL (ref 6.1–8.1)

## 2015-09-26 LAB — IMMUNOFIXATION ELECTROPHORESIS
IGA: 439 mg/dL (ref 81–463)
IgG (Immunoglobin G), Serum: 1364 mg/dL (ref 694–1618)
IgM, Serum: 76 mg/dL (ref 48–271)

## 2015-09-28 LAB — VITAMIN B1: Vitamin B1 (Thiamine): 25 nmol/L (ref 8–30)

## 2015-09-30 ENCOUNTER — Ambulatory Visit (INDEPENDENT_AMBULATORY_CARE_PROVIDER_SITE_OTHER): Payer: Medicare Other | Admitting: Neurology

## 2015-09-30 ENCOUNTER — Encounter: Payer: Medicare Other | Admitting: Neurology

## 2015-09-30 DIAGNOSIS — G609 Hereditary and idiopathic neuropathy, unspecified: Secondary | ICD-10-CM

## 2015-09-30 DIAGNOSIS — M5417 Radiculopathy, lumbosacral region: Secondary | ICD-10-CM

## 2015-09-30 NOTE — Procedures (Signed)
[facility]  [place], [address]  [place], [place] [address] Tel: [phone] Fax:  [phone] Test Date:  [id number]  Patient: Greg Booker DOB: [id number] Physician: Narda Amber, DO  Sex: Male Height: 5\' 9"  Ref Phys: Narda Amber, DO  ID#: [id number] Temp: 32.6C Technician: Jerilynn Mages. Dean   Patient Complaints: This is an 82 year old gentleman referred for evaluation of bilateral feet numbness, weakness, and burning paresthesias.  NCV & EMG Findings: Extensive electrodiagnostic testing of the left lower extremity and additional studies of the right shows: 1. Bilateral sural and superficial peroneal sensory responses are absent. 2. Bilateral peroneal (EDB) and tibial motor responses showed markedly reduced amplitude and borderline slowed conduction velocity.  Bilateral peroneal motor responses at the tibialis anterior within normal limits. 3. Bilateral tibial H reflex studies are prolonged. 4. Chronic motor axon loss changes are seen affecting the muscles below the knee bilaterally. There is no evidence of accompanied active denervation.  Impression: 1. The electrophysiologic studies are most consistent with a distal and symmetric chronic, sensorimotor polyneuropathy, axon loss in type, affecting the lower extremities. 2. There is no evidence of a lumbosacral radiculopathy affecting the legs.    ___________________________ Narda Amber, DO    Nerve Conduction Studies Anti Sensory Summary Table   Stim Site NR Peak (ms) Norm Peak (ms) P-T Amp (V) Norm P-T Amp  Left Sup Peroneal Anti Sensory (Ant Lat Mall)  12 cm NR  <4.6  >3  Right Sup Peroneal Anti Sensory (Ant Lat Mall)  32.6C  12 cm NR  <4.6  >3  Left Sural Anti Sensory (Lat Mall)  Calf NR  <4.6  >3  Right Sural Anti Sensory (Lat Mall)  32.6C  Calf NR  <4.6  >3   Motor Summary Table   Stim Site NR Onset (ms) Norm Onset (ms) O-P Amp (mV) Norm O-P Amp Site1 Site2 Delta-0 (ms) Dist (cm) Vel (m/s) Norm Vel  (m/s)  Left Peroneal Motor (Ext Dig Brev)  Ankle    5.2 <6.0 0.4 >2.5 B Fib Ankle 8.9 34.0 38 >40  B Fib    14.1  0.2  Poplt B Fib 2.7 10.0 37 >40  Poplt    16.8  0.2         Right Peroneal Motor (Ext Dig Brev)  32.6C  Ankle    3.5 <6.0 0.2 >2.5 B Fib Ankle 9.2 33.0 36 >40  B Fib    12.7  0.2  Poplt B Fib 2.8 10.0 36 >40  Poplt    15.5  0.2         Left Peroneal TA Motor (Tib Ant)  Fib Head    2.0 <4.5 3.2 >3 Poplit Fib Head 1.7 10.0 59 >40  Poplit    3.7  3.3         Right Peroneal TA Motor (Tib Ant)  32.6C  Fib Head    2.4 <4.5 3.5 >3 Poplit Fib Head 1.7 10.0 59 >40  Poplit    4.1  3.3         Left Tibial Motor (Abd Hall Brev)  Ankle    4.1 <6.0 1.6 >4 Knee Ankle 7.2 39.0 54 >40  Knee    11.3  1.1         Right Tibial Motor (Abd Hall Brev)  32.6C  Ankle    4.1 <6.0 0.6 >4 Knee Ankle 12.2 38.0 31 >40  Knee    16.3  0.2  H Reflex Studies   NR H-Lat (ms) Lat Norm (ms) L-R H-Lat (ms) M-Lat (ms) HLat-MLat (ms)  Left Tibial (Gastroc)     36.73 <35 0.54 4.49 32.24  Right Tibial (Gastroc)  32.6C     37.28 <35 0.54 4.49 32.79   EMG   Side Muscle Ins Act Fibs Psw Fasc Number Recrt Dur Dur. Amp Amp. Poly Poly. Comment  Right AntTibialis Nml Nml Nml Nml 1- Rapid Few 1+ Few 1+ Nml Nml N/A  Right Gastroc Nml Nml Nml Nml 1- Rapid Few 1+ Few 1+ Nml Nml N/A  Right RectFemoris Nml Nml Nml Nml Nml Nml Nml Nml Nml Nml Nml Nml N/A  Left GluteusMed Nml Nml Nml Nml Nml Nml Nml Nml Nml Nml Nml Nml N/A  Left AntTibialis Nml Nml Nml Nml 1- Rapid Few 1+ Few 1+ Nml Nml N/A  Left Gastroc Nml Nml Nml Nml 1- Rapid Few 1+ Few 1+ Nml Nml N/A  Left RectFemoris Nml Nml Nml Nml Nml Nml Nml Nml Nml Nml Nml Nml N/A  Left BicepsFemS Nml Nml Nml Nml Nml Nml Nml Nml Nml Nml Nml Nml N/A      Waveforms:

## 2015-10-01 ENCOUNTER — Telehealth: Payer: Self-pay | Admitting: Neurology

## 2015-10-01 NOTE — Telephone Encounter (Signed)
Patient informed that his EMG shows distal and symmetric sensorimotor polyneuropathy, consistent with his exam.  Labs are unremarkable.  He continues to have painful paresthesias, so it was recommended to increase gabapentin to 400mg  at bedtime.    Donika K. Posey Pronto, DO

## 2015-10-02 ENCOUNTER — Ambulatory Visit (INDEPENDENT_AMBULATORY_CARE_PROVIDER_SITE_OTHER): Payer: Medicare Other | Admitting: Pharmacist

## 2015-10-02 DIAGNOSIS — I482 Chronic atrial fibrillation, unspecified: Secondary | ICD-10-CM

## 2015-10-02 DIAGNOSIS — Z7901 Long term (current) use of anticoagulants: Secondary | ICD-10-CM | POA: Diagnosis not present

## 2015-10-02 LAB — POCT INR: INR: 3.2

## 2015-10-06 ENCOUNTER — Telehealth: Payer: Self-pay | Admitting: Internal Medicine

## 2015-10-06 NOTE — Telephone Encounter (Signed)
Returned call to patient's wife no answer.LMTC. 

## 2015-10-06 NOTE — Telephone Encounter (Signed)
Returned call to patient.He stated when he was at cardiac rehab in [place] today while exercising he had chest pain,sob that lasted appox 40 mins.Stated he was advised to go to ER, but did not want to go.Stated no chest pain or sob at present.Appointment scheduled with Kerin Ransom PA tomorrow 10/07/15 at 8:30 am.Advised to go to ER if has chest pain.

## 2015-10-06 NOTE — Telephone Encounter (Signed)
New message      Pt stated that this am while at rehab (on the exercise bike) in [place], he had chest pain, sob, high pulse and bp.  They called the EMS but he refused to go to the hosp because he started feeling better.  He was told to call [facility] office to let him know of this and see what he wanted to do.  Please advise

## 2015-10-06 NOTE — Telephone Encounter (Signed)
Follow Up ° ° ° ° °Pt is returning call from earlier. Please call. °

## 2015-10-07 ENCOUNTER — Ambulatory Visit (INDEPENDENT_AMBULATORY_CARE_PROVIDER_SITE_OTHER): Payer: Medicare Other | Admitting: Cardiology

## 2015-10-07 ENCOUNTER — Encounter: Payer: Self-pay | Admitting: Cardiology

## 2015-10-07 ENCOUNTER — Encounter: Payer: Self-pay | Admitting: Internal Medicine

## 2015-10-07 VITALS — BP 120/62 | HR 103 | Ht 69.0 in | Wt 193.8 lb

## 2015-10-07 DIAGNOSIS — Z7901 Long term (current) use of anticoagulants: Secondary | ICD-10-CM

## 2015-10-07 DIAGNOSIS — W19XXXD Unspecified fall, subsequent encounter: Secondary | ICD-10-CM

## 2015-10-07 DIAGNOSIS — I482 Chronic atrial fibrillation, unspecified: Secondary | ICD-10-CM

## 2015-10-07 DIAGNOSIS — R079 Chest pain, unspecified: Secondary | ICD-10-CM | POA: Diagnosis not present

## 2015-10-07 MED ORDER — NITROGLYCERIN 0.4 MG SL SUBL: 0.4000 mg | SUBLINGUAL_TABLET | SUBLINGUAL | 3 refills | Status: DC | PRN

## 2015-10-07 NOTE — Progress Notes (Signed)
[id number] BREAKER CAPITO   [id number]  [id number]  Primary Physician Salena Saner., MD Primary Cardiologist: Dr Debara Pickett  HPI:  Mr Burker is an 82 y/o male with a history of CAD with known total RCA. Last Myoview in 2015 was low risk. He has CAF and is on Coumadin. Recently he has had some balance issues. He was evaluated by neurology and is getting physical therapy. Yesterday at physical therapy he had epigastric pain while on the exercise bike. He describes a fullness that was across his chest. He denies any associated diaphoresis but admits to SOB with this. The pt felt like his discomfort was GI. The PT staff suggested he go to the ED but he declined and is seen now in the office. He has had no further discomfort. 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 He denies any other episodes of similar chest discomfort.    Current Outpatient Prescriptions  Medication Sig Dispense Refill  . b complex vitamins tablet Take 1 tablet by mouth daily.    . bicalutamide (CASODEX) 50 MG tablet Take 50 mg by mouth daily.    . cetirizine (ZYRTEC) 10 MG tablet Take 10 mg by mouth daily.    . Cholecalciferol (VITAMIN D3) 1000 UNITS CAPS Take 1,000 Units by mouth daily.     Marland Kitchen diltiazem (CARDIZEM CD) 240 MG 24 hr capsule Take 240 mg by mouth daily.     . ferrous sulfate 325 (65 FE) MG tablet Take 325 mg by mouth daily with breakfast.    . furosemide (LASIX) 40 MG tablet Take 40 mg by mouth daily.    Marland Kitchen gabapentin (NEURONTIN) 100 MG capsule Take 1 capsules by mouth each night for one week, then increase to 2 tablets. 60 capsule 5  . glimepiride (AMARYL) 1 MG tablet Take 1 mg by mouth daily.    Marland Kitchen HYDROcodone-acetaminophen (NORCO/VICODIN) 5-325 MG per tablet Take 1-2 tablets by mouth every 4 (four) hours as needed for moderate pain. 30 tablet 0  . isosorbide mononitrate (IMDUR) 60 MG 24 hr tablet Take 60 mg by mouth daily.     Marland Kitchen lisinopril (PRINIVIL,ZESTRIL) 10 MG tablet  Take 10 mg by mouth at bedtime.     . metoprolol succinate (TOPROL-XL) 50 MG 24 hr tablet Take 50 mg by mouth daily. Take with or immediately following a meal.    . MYRBETRIQ 50 MG TB24 tablet Take 50 mg by mouth daily.    . naproxen sodium (ANAPROX) 220 MG tablet Take 220 mg by mouth as needed.    . niacin 500 MG CR capsule Take 500 mg by mouth at bedtime.    Marland Kitchen omeprazole (PRILOSEC) 20 MG capsule Take 20 mg by mouth daily.    . Saw Palmetto, Serenoa repens, 450 MG CAPS Take 1 capsule by mouth daily.    . Tamsulosin HCl (FLOMAX) 0.4 MG CAPS Take 0.4 mg by mouth daily after supper.    . vitamin C (ASCORBIC ACID) 500 MG tablet Take 500 mg by mouth at bedtime.     . VOLTAREN 1 % GEL Apply as directed    . warfarin (COUMADIN) 5 MG tablet TAKE ONE-HALF TO ONE TABLET BY MOUTH ONCE DAILY AS DIRECTED BY COUMADIN CLINIC 90 tablet 1  . nitroGLYCERIN (NITROSTAT) 0.4 MG SL tablet Place 1 tablet (0.4 mg total) under the tongue every 5 (five) minutes as needed for chest pain. 25 tablet 3   No current facility-administered  medications for this visit.     Allergies  Allergen Reactions  . Bicalutamide Other (See Comments)    Casodex caused increase in breast size  . Lovastatin Other (See Comments)    Leg weakness  . Niacin And Related Other (See Comments)    Burning and stinging from tablets (otc capsules ok)  . Pravastatin Other (See Comments)    myalgias  . Codeine Itching and Rash    Social History   Social History  . Marital status: Widowed    Spouse name: N/A  . Number of children: N/A  . Years of education: N/A   Occupational History  . Not on file.   Social History Main Topics  . Smoking status: Former Smoker    Packs/day: 1.00    Years: 25.00    Types: Cigarettes, Pipe    Quit date: 02/23/1972  . Smokeless tobacco: Never Used  . Alcohol use No  . Drug use: No  . Sexual activity: Not on file   Other Topics Concern  . Not on file   Social History Narrative   Lives with  male friend in a one story home with basement.  Has no children.     Retired from Architect, Hospital doctor, Therapist, music.     Education: some college.      Review of Systems: General: negative for chills, fever, night sweats or weight changes.  Cardiovascular: negative for dyspnea on exertion, edema, orthopnea, palpitations, paroxysmal nocturnal dyspnea or shortness of breath Dermatological: negative for rash Respiratory: negative for cough or wheezing Urologic: negative for hematuria Abdominal: negative for nausea, vomiting, diarrhea, bright red blood per rectum, melena, or hematemesis Neurologic: negative for visual changes, syncope, or dizziness All other systems reviewed and are otherwise negative except as noted above.    Blood pressure 120/62, pulse (!) 103, height 5\' 9"  (1.753 m), weight 193 lb 12.8 oz (87.9 kg), SpO2 98 %.  General appearance: alert, cooperative and no distress Neck: no carotid bruit and no JVD Lungs: clear to auscultation bilaterally Heart: irregularly irregular rhythm Extremities: extremities normal, atraumatic, no cyanosis or edema Skin: Skin color, texture, turgor normal. No rashes or lesions Neurologic: Grossly normal  EKG AF with CVR  ASSESSMENT AND PLAN:   Chest pain with moderate risk of acute coronary syndrome Pt had chest pain while on an exercise bike at physical therapy   CAD S/P percutaneous coronary angioplasty BMS to RCA in '97, occluded RCA and 50% CFX at cath 9/06 Myoview done prior to hip surgery in 2015 was low risk.  Chronic a-fib Asymptomatic, rate controlled  Chronic anticoagulation Coumadin Rx   PLAN  I suggested we proceed with a Lexiscan Myoview before he returns to Physical Therapy. I also provided him with an RX for SL NTG.   Kerin Ransom PA-C 10/07/2015 9:04 AM

## 2015-10-07 NOTE — Patient Instructions (Signed)
Medication Instructions:  Your physician recommends that you continue on your current medications as directed. Please refer to the Current Medication list given to you today.  Labwork: NONE   Testing/Procedures: Your physician has requested that you have a lexiscan myoview. For further information please visit [email or web address]. Please follow instruction sheet, as given.   Follow-Up: Your physician wants you to follow-up in: November 2017(RECALL ALREADY IN CHART-OK PER [place]) You will receive a reminder letter in the mail two months in advance. If you don't receive a letter, please call our office to schedule the follow-up appointment.  Any Other Special Instructions Will Be Listed Below (If Applicable). LEXISCAN will be done at our [facility]  If you need a refill on your cardiac medications before your next appointment, please call your pharmacy.

## 2015-10-07 NOTE — Assessment & Plan Note (Addendum)
Pt had chest pain while on an exercise bike at physical therapy

## 2015-10-07 NOTE — Assessment & Plan Note (Signed)
Asymptomatic, rate controlled 

## 2015-10-07 NOTE — Assessment & Plan Note (Addendum)
BMS to RCA in '97, occluded RCA and 50% CFX at cath 9/06 Myoview done prior to hip surgery in 2015 was low risk.

## 2015-10-07 NOTE — Assessment & Plan Note (Signed)
Coumadin Rx 

## 2015-10-08 ENCOUNTER — Telehealth (HOSPITAL_COMMUNITY): Payer: Self-pay | Admitting: *Deleted

## 2015-10-08 NOTE — Telephone Encounter (Signed)
Left message on voicemail in reference to upcoming appointment scheduled for 10/09/15. Phone number given for a call back so details instructions can be given.  Greg Booker

## 2015-10-09 ENCOUNTER — Ambulatory Visit (HOSPITAL_COMMUNITY): Payer: Medicare Other | Attending: Cardiology

## 2015-10-09 VITALS — Ht 69.0 in | Wt 193.0 lb

## 2015-10-09 DIAGNOSIS — I10 Essential (primary) hypertension: Secondary | ICD-10-CM | POA: Insufficient documentation

## 2015-10-09 DIAGNOSIS — I2510 Atherosclerotic heart disease of native coronary artery without angina pectoris: Secondary | ICD-10-CM | POA: Insufficient documentation

## 2015-10-09 DIAGNOSIS — E119 Type 2 diabetes mellitus without complications: Secondary | ICD-10-CM | POA: Insufficient documentation

## 2015-10-09 DIAGNOSIS — R9439 Abnormal result of other cardiovascular function study: Secondary | ICD-10-CM | POA: Diagnosis not present

## 2015-10-09 DIAGNOSIS — R079 Chest pain, unspecified: Secondary | ICD-10-CM | POA: Insufficient documentation

## 2015-10-09 DIAGNOSIS — I25709 Atherosclerosis of coronary artery bypass graft(s), unspecified, with unspecified angina pectoris: Secondary | ICD-10-CM

## 2015-10-09 LAB — MYOCARDIAL PERFUSION IMAGING
LV dias vol: 101 mL (ref 62–150)
LV sys vol: 48 mL
Peak HR: 139 {beats}/min
RATE: 0.31
Rest HR: 97 {beats}/min
SDS: 7
SRS: 2
SSS: 9
TID: 0.98

## 2015-10-09 MED ORDER — REGADENOSON 0.4 MG/5ML IV SOLN
0.4000 mg | Freq: Once | INTRAVENOUS | Status: AC
  Administered 2015-10-09: 0.4 mg via INTRAVENOUS

## 2015-10-09 MED ORDER — TECHNETIUM TC 99M TETROFOSMIN IV KIT
31.4000 | PACK | Freq: Once | INTRAVENOUS | Status: AC | PRN
  Administered 2015-10-09: 31 via INTRAVENOUS
  Filled 2015-10-09: qty 31

## 2015-10-09 MED ORDER — TECHNETIUM TC 99M TETROFOSMIN IV KIT
10.3000 | PACK | Freq: Once | INTRAVENOUS | Status: AC | PRN
  Administered 2015-10-09: 10 via INTRAVENOUS
  Filled 2015-10-09: qty 10

## 2015-10-13 ENCOUNTER — Telehealth: Payer: Self-pay | Admitting: Internal Medicine

## 2015-10-13 NOTE — Telephone Encounter (Signed)
  Notes Recorded by Erlene Quan, PA-C on 10/10/2015 at 9:25 AM EDT Please let the pt know his stress test looked OK- it shows his prior total RCA blockage. If he has recurrent chest pain he should let us know. OK to resume exercise. I'll ask Dr Debara Pickett to review as well.   Advised patient of results. Patient did have an episode of chest pain on Sunday that lasted10-15 minutes. Denied any shortness of breath. Chest pain across the chest radiating to left arm. He did not use any NTG  This is the only other episode of chest pain that patient has had since prior to seeing [facility] patient to call back if he continues to have chest pain and will inform Dr Debara Pickett and Lurena Joiner of episode Sunday. If further recommendations would call him back if not keep follow up as scheduled

## 2015-10-13 NOTE — Telephone Encounter (Signed)
New message ° ° ° ° ° °Calling to get stress test results °

## 2015-10-13 NOTE — Telephone Encounter (Signed)
-----   Message from Pixie Casino, MD sent at 10/10/2015 10:29 AM EDT ----- I agree .Marland Kitchen This suggests old infarct without any clear ischemia. Don't think that there is a cath indication at this point.  Dr. Lemmie Evens

## 2015-10-14 MED ORDER — RANOLAZINE ER 500 MG PO TB12: 500.0000 mg | ORAL_TABLET | Freq: Two times a day (BID) | ORAL | 5 refills | Status: DC

## 2015-10-14 NOTE — Telephone Encounter (Signed)
I would add Ranexa 500 mg BID. This may take a week or two to take affect. If he has recurrent chest pain he will need to come in and see Dr Debara Pickett or myself on a day Dr Debara Pickett is in the office.   Kerin Ransom PA-C 10/14/2015 8:04 AM

## 2015-10-14 NOTE — Telephone Encounter (Signed)
Advised patient, verbalized understanding  

## 2015-10-16 ENCOUNTER — Ambulatory Visit (INDEPENDENT_AMBULATORY_CARE_PROVIDER_SITE_OTHER): Payer: Medicare Other | Admitting: Pharmacist

## 2015-10-16 DIAGNOSIS — I482 Chronic atrial fibrillation, unspecified: Secondary | ICD-10-CM

## 2015-10-16 DIAGNOSIS — Z7901 Long term (current) use of anticoagulants: Secondary | ICD-10-CM

## 2015-10-16 LAB — POCT INR: INR: 1.9

## 2015-10-24 DIAGNOSIS — E1151 Type 2 diabetes mellitus with diabetic peripheral angiopathy without gangrene: Secondary | ICD-10-CM | POA: Insufficient documentation

## 2015-10-28 DIAGNOSIS — I70222 Atherosclerosis of native arteries of extremities with rest pain, left leg: Secondary | ICD-10-CM | POA: Insufficient documentation

## 2015-11-04 DIAGNOSIS — E785 Hyperlipidemia, unspecified: Secondary | ICD-10-CM | POA: Insufficient documentation

## 2015-12-03 ENCOUNTER — Ambulatory Visit: Payer: Medicare Other | Admitting: Internal Medicine

## 2015-12-08 DIAGNOSIS — A419 Sepsis, unspecified organism: Secondary | ICD-10-CM | POA: Insufficient documentation

## 2015-12-08 DIAGNOSIS — IMO0002 Reserved for concepts with insufficient information to code with codable children: Secondary | ICD-10-CM | POA: Insufficient documentation

## 2015-12-08 DIAGNOSIS — R778 Other specified abnormalities of plasma proteins: Secondary | ICD-10-CM | POA: Insufficient documentation

## 2015-12-08 DIAGNOSIS — R9431 Abnormal electrocardiogram [ECG] [EKG]: Secondary | ICD-10-CM | POA: Insufficient documentation

## 2015-12-08 DIAGNOSIS — N179 Acute kidney failure, unspecified: Secondary | ICD-10-CM | POA: Insufficient documentation

## 2015-12-08 DIAGNOSIS — R7989 Other specified abnormal findings of blood chemistry: Secondary | ICD-10-CM

## 2015-12-08 DIAGNOSIS — J811 Chronic pulmonary edema: Secondary | ICD-10-CM | POA: Insufficient documentation

## 2015-12-08 DIAGNOSIS — E119 Type 2 diabetes mellitus without complications: Secondary | ICD-10-CM | POA: Insufficient documentation

## 2015-12-09 DIAGNOSIS — R7881 Bacteremia: Secondary | ICD-10-CM | POA: Insufficient documentation

## 2015-12-26 ENCOUNTER — Ambulatory Visit: Payer: Medicare Other | Admitting: Internal Medicine

## 2015-12-30 ENCOUNTER — Ambulatory Visit: Payer: Medicare Other | Admitting: Internal Medicine

## 2015-12-31 ENCOUNTER — Ambulatory Visit (INDEPENDENT_AMBULATORY_CARE_PROVIDER_SITE_OTHER): Payer: Medicare Other | Admitting: Pharmacist

## 2015-12-31 ENCOUNTER — Telehealth: Payer: Self-pay | Admitting: Pharmacist

## 2015-12-31 DIAGNOSIS — I482 Chronic atrial fibrillation, unspecified: Secondary | ICD-10-CM

## 2015-12-31 DIAGNOSIS — Z7901 Long term (current) use of anticoagulants: Secondary | ICD-10-CM

## 2015-12-31 LAB — PROTIME-INR: INR: 1.9 — AB (ref <=1.1)

## 2015-12-31 NOTE — Telephone Encounter (Signed)
Kim from [facility] to draw INR today and call results to [facility] office. Verbal order given.   Coumadin appt for today cancelled.

## 2016-01-09 ENCOUNTER — Other Ambulatory Visit: Payer: Self-pay | Admitting: Pharmacist

## 2016-01-09 MED ORDER — WARFARIN SODIUM 5 MG PO TABS: ORAL_TABLET | ORAL | 0 refills | Status: DC

## 2016-01-14 DIAGNOSIS — L97422 Non-pressure chronic ulcer of left heel and midfoot with fat layer exposed: Secondary | ICD-10-CM

## 2016-01-14 DIAGNOSIS — E08621 Diabetes mellitus due to underlying condition with foot ulcer: Secondary | ICD-10-CM | POA: Insufficient documentation

## 2016-01-16 ENCOUNTER — Ambulatory Visit (INDEPENDENT_AMBULATORY_CARE_PROVIDER_SITE_OTHER): Payer: Medicare Other | Admitting: Pharmacist Clinician (PhC)/ Clinical Pharmacy Specialist

## 2016-01-16 DIAGNOSIS — Z7901 Long term (current) use of anticoagulants: Secondary | ICD-10-CM

## 2016-01-16 DIAGNOSIS — I482 Chronic atrial fibrillation, unspecified: Secondary | ICD-10-CM

## 2016-01-16 LAB — POCT INR: INR: 1.6

## 2016-01-19 ENCOUNTER — Other Ambulatory Visit (HOSPITAL_COMMUNITY): Payer: Self-pay | Admitting: Nurse Practitioner

## 2016-01-21 ENCOUNTER — Ambulatory Visit (INDEPENDENT_AMBULATORY_CARE_PROVIDER_SITE_OTHER): Payer: Medicare Other | Admitting: Pharmacist Clinician (PhC)/ Clinical Pharmacy Specialist

## 2016-01-21 ENCOUNTER — Ambulatory Visit (INDEPENDENT_AMBULATORY_CARE_PROVIDER_SITE_OTHER): Payer: Medicare Other | Admitting: Internal Medicine

## 2016-01-21 ENCOUNTER — Ambulatory Visit: Payer: Medicare Other | Admitting: Internal Medicine

## 2016-01-21 ENCOUNTER — Telehealth: Payer: Self-pay | Admitting: Internal Medicine

## 2016-01-21 ENCOUNTER — Encounter: Payer: Self-pay | Admitting: Internal Medicine

## 2016-01-21 VITALS — BP 112/74 | HR 104 | Ht 69.0 in | Wt 189.4 lb

## 2016-01-21 DIAGNOSIS — I482 Chronic atrial fibrillation, unspecified: Secondary | ICD-10-CM

## 2016-01-21 DIAGNOSIS — Z7901 Long term (current) use of anticoagulants: Secondary | ICD-10-CM

## 2016-01-21 DIAGNOSIS — Z9889 Other specified postprocedural states: Secondary | ICD-10-CM | POA: Diagnosis not present

## 2016-01-21 DIAGNOSIS — I2510 Atherosclerotic heart disease of native coronary artery without angina pectoris: Secondary | ICD-10-CM | POA: Insufficient documentation

## 2016-01-21 DIAGNOSIS — I25118 Atherosclerotic heart disease of native coronary artery with other forms of angina pectoris: Secondary | ICD-10-CM

## 2016-01-21 DIAGNOSIS — I739 Peripheral vascular disease, unspecified: Secondary | ICD-10-CM

## 2016-01-21 DIAGNOSIS — E785 Hyperlipidemia, unspecified: Secondary | ICD-10-CM | POA: Diagnosis not present

## 2016-01-21 LAB — POCT INR: INR: 2

## 2016-01-21 NOTE — Telephone Encounter (Signed)
Attempt to return call-# provided is invalid.

## 2016-01-21 NOTE — Patient Instructions (Signed)
Your physician recommends that you return for lab work  FASTING to check cholesterol   Your physician recommends that you schedule a follow-up appointment in: THREE MONTHS with Dr. Hilty   

## 2016-01-21 NOTE — Telephone Encounter (Signed)
New message    Home health calling need clarification on what type of lab work to draw.

## 2016-01-21 NOTE — Progress Notes (Signed)
OFFICE NOTE  Chief Complaint:  Hospital follow-up  Primary Care Physician: Salena Saner., MD  HPI:  Greg Booker is an 83 year old gentleman followed by Dr. Rex Booker with history of coronary disease in the past. Cardiac catheterization in 2006 showed an occluded right coronary with collaterals, EF 50%. A stress test in 2010 most recently was negative for ischemia. He has had trouble with DVT and has been off Coumadin for periods of time. Recently he had hip surgery, which he underwent uneventfully and has done well and is back on his Coumadin. Unfortunately, his levels were too high; as of January 3 his INR was 6.9.  It now seems to be more regulated. He is describing no symptoms of bleeding, shortness of breath, palpitations, presyncope, syncopal symptoms, chest pain, or any associated cardiac findings.   Greg Booker returns today and is feeling quite well. Unfortunately a few weeks ago he developed a pneumonia and was treated for this. He is now almost completely recovered. Fortunately, his warfarin levels have remained therapeutic. His gotten back to his activities and is looking forward to going on a cruise for the next week.  I had the pleasure of seeing Greg Booker back today in follow-up. He was last seen in 2015 and underwent a stress test which is negative for ischemia as part of a preoperative workup for hip replacement. He ultimately underwent hip replacement is doing very well. He denies any chest pain or worsening shortness of breath. I reviewed his medicines today noted that he is in chronic/permanent atrial fibrillation. His INR is a been stable and followed by Greg Booker, our anticoagulation pharmacist. He has been on long-term digoxin which was started by his previous cardiologist. He does have stage III chronic kidney disease. Recently there is data from a couple of very good studies which indicate that digoxin may increase the risk of mortality and that there was a clear cut off of  digoxin levels greater than 1.2 which wasn't associated with increased mortality, especially in patients with known coronary artery disease. He has not had a digoxin level tested in some time - it would be recommended that he has this testing done at least every 6 months.  06/12/2015  Greg Booker was seen back today in follow-up. Recently he was seen by Greg Bayley, NP, for imbalance, dizziness and difficulty walking. He denied any true syncopal episodes. Greg Booker ordered an echocardiogram and monitor. The monitor showed rate controlled A. fib without any significant pauses or tachyarrhythmias. The echocardiogram showed moderate aortic stenosis with normal LV function. Neither study indicated a clear etiology of his imbalance and dizziness. Concern is for his fall risk as he is on warfarin anticoagulation. Greg Booker does report he has a history of low back pain and neurosurgery.  01/21/2015  Greg Booker was seen today in follow-up. He recently was admitted at [facility] for acute limb ischemia of the left leg. 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 He ultimately had a peripheral angiogram and underwent left femoral-tibial bypass. He has had some difficulty in wound healing and is getting home health care. During this hospitalization he had no worsening chest pain or unstable angina. He had a recent stress test which showed no ischemia but a fixed inferior defect and was started on Ranexa for some exertional chest pain. This seems to be working well for him. His A. fib was somewhat uncontrolled during his hospitalization and the rate is somewhere around 100 today. He is reestablishing on  warfarin. He is not on an antiplatelet, but should be. Is also not had a recent lipid profile. In the past she's been intolerant to pravastatin, lovastatin and niacin causing myalgias and pruritus, respectively.  PMHx:  Past Medical History:  Diagnosis Date  . Anticoagulated on Coumadin   . Arthritis   .  Calcified granuloma of lung ([place]) RIGHT LUNG BASE - STABLE PER CXR 03-03-2010  . Chronic back pain   . CKD (chronic kidney disease), stage III   . Coronary artery disease    a. 1997 PCI/BMS to the RCA; b. 2006 Cath: Occluded RCA with collat flow, EF 60%, otw nonobs dzs; b. 2010 MV: No ischemia; c. 06/2013 MV: low risk w/o ischemia.  . Diabetes mellitus without complication ([place])   . Falls   . GERD (gastroesophageal reflux disease)   . Heart murmur   . History of DVT (deep vein thrombosis)    a. Early 2000's.  Marland Kitchen History of pneumonia 01/2013  . Hyperlipidemia   . Hypertensive heart disease   . Impaired hearing BILATERAL AIDS  . Low back pain radiating to left leg   . Mitral regurgitation   . Nocturia   . Numbness and tingling of left leg   . PAF (paroxysmal atrial fibrillation) (HCC)    a. CHA2DS2VASc = 5-->chronic coumadin.  . Prostate cancer ([place]) RECUR   a. 2006 S/P radioactive seed implant.  . Sciatic nerve pain LEFT LEG PAIN--  EPI INJECTIONS  . Urgency of urination     Past Surgical History:  Procedure Laterality Date  . BACK SURGERY    . CARDIAC CATHETERIZATION  1998   IN-STENT RESTENOSIS-- LARGE COLLATERALS  . CARDIAC CATHETERIZATION  2003  &  2006   2006 ---  MITRAL REGURG/ 100% OCCULSION RCA WITH LARGE COLLATERALS/ MILD DISEASE LAD & CIRCUMFLEX  . CARDIOVASCULAR STRESS TEST  06-03-2008   NORMAL STUDY/ LV NORMAL / COMPARED TO 12-26-2000 INFERIOR WALL ISCHEMIA IS NO LONGER PRESENT.  Marland Kitchen CARDIOVERSION  09-15-2005   UNSUCCESSFUL  . CORONARY ANGIOPLASTY WITH STENT PLACEMENT  1997- DR AL LITTLE   X1 STENT RCA  . CRYOABLATION  03/01/2011   Procedure: CRYO ABLATION PROSTATE;  Surgeon: Ailene Rud, MD;  Location: [facility];  Service: Urology;  Laterality: N/A;  . EXCISION BENIGN BREAST MASS  08-06-2008   LEFT  . JOINT REPLACEMENT    . LAMINECTOMY  10-28-2008   L3 - 4  . LUMBAR LAMINECTOMY/DECOMPRESSION MICRODISCECTOMY Right 04/05/2013   Procedure:  Right Lumbar three-four Laminectomy ;  Surgeon: Charlie Pitter, MD;  Location: [facility];  Service: Neurosurgery;  Laterality: Right;  . RADIOACTIVE SEED IMPLANT  08-03-2004   PROSTATE  . TOTAL HIP ARTHROPLASTY  11/02/2011   Procedure: TOTAL HIP ARTHROPLASTY;  Surgeon: Garald Balding, MD;  Location: [facility];  Service: Orthopedics;  Laterality: Left;  Left Total Hip Replacement  . TOTAL HIP ARTHROPLASTY Right 06/26/2013   Procedure: TOTAL HIP ARTHROPLASTY;  Surgeon: Garald Balding, MD;  Location: [facility];  Service: Orthopedics;  Laterality: Right;  . TRANSTHORACIC ECHOCARDIOGRAM  07-26-2007   LVSF NORMAL/ EF 55%/ LEFT ATRIUM 4.5CM MILD TO MOD. DILATED/ MILD  MITRAL & TRICUSPID REGURG    FAMHx:  Family History  Problem Relation Age of Onset  . Arthritis Mother   . Other Mother 40  . Heart Problems Father   . Heart disease Brother   . Breast cancer Sister   . Cancer Sister   . Heart Problems Brother  SOCHx:   reports that he quit smoking about 43 years ago. His smoking use included Cigarettes and Pipe. He has a 25.00 pack-year smoking history. He has never used smokeless tobacco. He reports that he does not drink alcohol or use drugs.  ALLERGIES:  Allergies  Allergen Reactions  . Bicalutamide Other (See Comments)    Casodex caused increase in breast size  . Lovastatin Other (See Comments)    Leg weakness  . Niacin And Related Other (See Comments)    Burning and stinging from tablets (otc capsules ok)  . Pravastatin Other (See Comments)    myalgias  . Codeine Itching and Rash    ROS: Pertinent items noted in HPI and remainder of comprehensive ROS otherwise negative.  HOME MEDS: Current Outpatient Prescriptions  Medication Sig Dispense Refill  . bicalutamide (CASODEX) 50 MG tablet Take 50 mg by mouth daily.    Marland Kitchen diltiazem (DILACOR XR) 120 MG 24 hr capsule Take 120 mg by mouth daily.    . furosemide (LASIX) 20 MG tablet Take 20 mg by mouth.    . gabapentin (NEURONTIN)  300 MG capsule Take 300 mg by mouth daily.    Marland Kitchen HYDROcodone-acetaminophen (NORCO/VICODIN) 5-325 MG per tablet Take 1-2 tablets by mouth every 4 (four) hours as needed for moderate pain. 30 tablet 0  . isosorbide mononitrate (IMDUR) 60 MG 24 hr tablet Take 60 mg by mouth daily.     Marland Kitchen levofloxacin (LEVAQUIN) 750 MG tablet Take 750 mg by mouth every other day.    . metoprolol succinate (TOPROL-XL) 50 MG 24 hr tablet Take 50 mg by mouth daily. Take with or immediately following a meal.    . omeprazole (PRILOSEC) 20 MG capsule Take 20 mg by mouth daily.    . ranolazine (RANEXA) 500 MG 12 hr tablet Take 1 tablet (500 mg total) by mouth 2 (two) times daily. 60 tablet 5  . Tamsulosin HCl (FLOMAX) 0.4 MG CAPS Take 0.4 mg by mouth daily after supper.    . warfarin (COUMADIN) 5 MG tablet TAKE ONE-HALF TO ONE TABLET BY MOUTH ONCE DAILY AS DIRECTED BY COUMADIN CLINIC 90 tablet 0  . nitroGLYCERIN (NITROSTAT) 0.4 MG SL tablet Place 1 tablet (0.4 mg total) under the tongue every 5 (five) minutes as needed for chest pain. 25 tablet 3   No current facility-administered medications for this visit.     LABS/IMAGING: No results found for this or any previous visit (from the past 48 hour(s)). No results found.  VITALS: BP 112/74 (BP Location: Right Arm, Patient Position: Sitting, Cuff Size: Normal)   Pulse (!) 104   Ht 5\' 9"  (1.753 m)   Wt 189 lb 6 oz (85.9 kg)   BMI 27.97 kg/m   EXAM: General appearance: alert and no distress Lungs: clear to auscultation bilaterally Heart: regular rate and rhythm, S1, S2 normal and systolic murmur: systolic ejection 3/6, crescendo at 2nd right intercostal space Extremities: extremities normal, atraumatic, no cyanosis or edema Neurologic: Mental status: Alert, oriented, thought content appropriate  EKG: Atrial fibrillation with rapid ventricular response at 105, nonspecific ST and T changes  ASSESSMENT: 1. PAD with recent left critical limb ischemia-status post 3  digit amputation and left femoral-tibial bypass (Dr. Ike Bene at [facility]) 2. Dizziness, imbalance, frequent falls 3. Chronic atrial fibrillation - CHADSVASC score of 5 (anticoagulated on warfarin) 4. Coronary artery disease with occluded right coronary left to right collaterals, EF 50% 5. Moderate aortic stenosis. 6. Recent pneumonia 7. Hypertension 8. Dyslipidemia 9. Overweight  PLAN: 1.   Mr. Klapper fortunately had recent critical limb ischemia and underwent peripheral bypass the left leg. He had amputation of 3 toes but at this point looks like he may be able to save his foot. He is on warfarin and INR is being adjusted. He should be on aspirin as well for coronary disease and PAD. I asked him to discuss this with his vascular surgeon as he's had some recent bleeding. In addition he is not on a statin. He's had intolerance in the past that it was not clear if his leg pain was related to PAD, peripheral neuropathy or the statin. I suspect it was the former. I like to recheck a lipid profile and most likely will put him on a high potency statin. He should continue on Ranexa for his chest pain.  Follow-up in 3 months.  Pixie Casino, MD, [facility] Attending Cardiologist [place] Kynard 01/21/2016, 9:15 AM

## 2016-01-27 ENCOUNTER — Other Ambulatory Visit: Payer: Self-pay | Admitting: Internal Medicine

## 2016-02-05 ENCOUNTER — Telehealth: Payer: Self-pay | Admitting: Pharmacist

## 2016-02-05 NOTE — Telephone Encounter (Signed)
Patient discharged from [facility]. Need appointment at clinic.   Appointment scheduled for 02/13/16 at [facility]

## 2016-02-09 ENCOUNTER — Telehealth: Payer: Self-pay | Admitting: Internal Medicine

## 2016-02-09 NOTE — Telephone Encounter (Signed)
Mailed signed orders for [facility] ([place] [id number]) regarding venipuncture/lab order for lipid panel and coumadin dosing.

## 2016-02-17 ENCOUNTER — Ambulatory Visit (INDEPENDENT_AMBULATORY_CARE_PROVIDER_SITE_OTHER): Payer: Medicare Other | Admitting: Pharmacist

## 2016-02-17 DIAGNOSIS — Z7901 Long term (current) use of anticoagulants: Secondary | ICD-10-CM

## 2016-02-17 DIAGNOSIS — I482 Chronic atrial fibrillation, unspecified: Secondary | ICD-10-CM

## 2016-02-17 LAB — POCT INR: INR: 2.3

## 2016-03-01 ENCOUNTER — Ambulatory Visit (INDEPENDENT_AMBULATORY_CARE_PROVIDER_SITE_OTHER): Payer: Medicare Other | Admitting: Pharmacist Clinician (PhC)/ Clinical Pharmacy Specialist

## 2016-03-01 ENCOUNTER — Ambulatory Visit: Payer: Medicare Other | Admitting: Neurology

## 2016-03-01 DIAGNOSIS — I482 Chronic atrial fibrillation, unspecified: Secondary | ICD-10-CM

## 2016-03-01 DIAGNOSIS — Z7901 Long term (current) use of anticoagulants: Secondary | ICD-10-CM | POA: Diagnosis not present

## 2016-03-01 LAB — POCT INR: INR: 3

## 2016-03-12 ENCOUNTER — Ambulatory Visit (INDEPENDENT_AMBULATORY_CARE_PROVIDER_SITE_OTHER): Payer: Medicare Other | Admitting: Pharmacist Clinician (PhC)/ Clinical Pharmacy Specialist

## 2016-03-12 DIAGNOSIS — Z7901 Long term (current) use of anticoagulants: Secondary | ICD-10-CM

## 2016-03-12 DIAGNOSIS — I482 Chronic atrial fibrillation, unspecified: Secondary | ICD-10-CM

## 2016-03-12 LAB — POCT INR: INR: 4.6

## 2016-03-19 ENCOUNTER — Ambulatory Visit (INDEPENDENT_AMBULATORY_CARE_PROVIDER_SITE_OTHER): Payer: Medicare Other | Admitting: Pharmacist Clinician (PhC)/ Clinical Pharmacy Specialist

## 2016-03-19 DIAGNOSIS — I482 Chronic atrial fibrillation, unspecified: Secondary | ICD-10-CM

## 2016-03-19 DIAGNOSIS — Z7901 Long term (current) use of anticoagulants: Secondary | ICD-10-CM | POA: Diagnosis not present

## 2016-03-19 LAB — POCT INR: INR: 2

## 2016-03-31 ENCOUNTER — Telehealth: Payer: Self-pay | Admitting: Internal Medicine

## 2016-03-31 NOTE — Telephone Encounter (Signed)
Returned call to [place]. She is seeing patient for wound on food. Patient is having pain in R foot/ankle, tender. Legs are more swollen. Patient has gained 6lbs in last few weeks. Patient is more SOB.   HR is always rapid per nurse, was 112bpm today Patient told her his BP has been up Today BP was 142/80  Patient reported he is not taking a fluid pill but it is on the [facility] nurse list.   Returned call to patient. He states he gets out of breath easy. He has a hard time getting around.  Patient states he does not take a fluid pill to his knowledge. Lasix is on our list. Advised that he bring ALL of his pill bottles for all the medications that he is currently taking.   Advised that if patient becomes more SOB, he should seek ED eval.  Will routed to MD as FYI - appt 3/22 w/Dr. Debara Pickett

## 2016-03-31 NOTE — Telephone Encounter (Signed)
LM for [facility] to call back

## 2016-03-31 NOTE — Telephone Encounter (Signed)
New Message   Appt 3/22 3pm  Pt c/o Shortness Of Breath: STAT if SOB developed within the last 24 hours or pt is noticeably SOB on the phone  1. Are you currently SOB (can you hear that pt is SOB on the phone)? Nurse on line   2. How long have you been experiencing SOB? Last couple day  3. Are you SOB when sitting or when up moving around? Both   4. Are you currently experiencing any other symptoms? Swelling in his legs , gained 6lbs, pulse 112

## 2016-04-01 ENCOUNTER — Ambulatory Visit (INDEPENDENT_AMBULATORY_CARE_PROVIDER_SITE_OTHER): Payer: Medicare Other | Admitting: Pharmacist Clinician (PhC)/ Clinical Pharmacy Specialist

## 2016-04-01 ENCOUNTER — Ambulatory Visit (INDEPENDENT_AMBULATORY_CARE_PROVIDER_SITE_OTHER): Payer: Medicare Other | Admitting: Internal Medicine

## 2016-04-01 VITALS — BP 122/64 | HR 93 | Ht 69.0 in | Wt 194.0 lb

## 2016-04-01 DIAGNOSIS — I482 Chronic atrial fibrillation, unspecified: Secondary | ICD-10-CM

## 2016-04-01 DIAGNOSIS — Z79899 Other long term (current) drug therapy: Secondary | ICD-10-CM | POA: Diagnosis not present

## 2016-04-01 DIAGNOSIS — Z89432 Acquired absence of left foot: Secondary | ICD-10-CM | POA: Insufficient documentation

## 2016-04-01 DIAGNOSIS — Z7901 Long term (current) use of anticoagulants: Secondary | ICD-10-CM

## 2016-04-01 DIAGNOSIS — I509 Heart failure, unspecified: Secondary | ICD-10-CM | POA: Diagnosis not present

## 2016-04-01 DIAGNOSIS — I70213 Atherosclerosis of native arteries of extremities with intermittent claudication, bilateral legs: Secondary | ICD-10-CM | POA: Insufficient documentation

## 2016-04-01 DIAGNOSIS — I350 Nonrheumatic aortic (valve) stenosis: Secondary | ICD-10-CM

## 2016-04-01 DIAGNOSIS — R0602 Shortness of breath: Secondary | ICD-10-CM

## 2016-04-01 LAB — POCT INR: INR: 2.8

## 2016-04-01 MED ORDER — FUROSEMIDE 40 MG PO TABS: 40.0000 mg | ORAL_TABLET | Freq: Two times a day (BID) | ORAL | 5 refills | Status: DC

## 2016-04-01 NOTE — Patient Instructions (Signed)
Your physician has recommended you make the following change in your medication:  -- INCREASE lasix to 40mg  twice daily  Your physician recommends that you return for lab work TODAY  Your physician has requested that you have an echocardiogram @ [address] - [address]rd Floor - ASAP. Echocardiography is a painless test that uses sound waves to create images of your heart. It provides your doctor with information about the size and shape of your heart and how well your heart's chambers and valves are working. This procedure takes approximately one hour. There are no restrictions for this procedure.  Your physician recommends that you schedule a follow-up appointment in: Wednesday March 28 - add to end of Dr. Debara Pickett morning schedule

## 2016-04-02 DIAGNOSIS — I350 Nonrheumatic aortic (valve) stenosis: Secondary | ICD-10-CM | POA: Insufficient documentation

## 2016-04-02 DIAGNOSIS — I509 Heart failure, unspecified: Secondary | ICD-10-CM | POA: Insufficient documentation

## 2016-04-02 DIAGNOSIS — R0602 Shortness of breath: Secondary | ICD-10-CM | POA: Insufficient documentation

## 2016-04-02 LAB — BASIC METABOLIC PANEL
BUN: 27 mg/dL — AB (ref 7–25)
CALCIUM: 9 mg/dL (ref 8.6–10.3)
CO2: 18 mmol/L — ABNORMAL LOW (ref 20–31)
Chloride: 109 mmol/L (ref 98–110)
Creat: 1.85 mg/dL — ABNORMAL HIGH (ref 0.70–1.11)
GLUCOSE: 119 mg/dL — AB (ref 65–99)
Potassium: 3.8 mmol/L (ref 3.5–5.3)
Sodium: 141 mmol/L (ref 135–146)

## 2016-04-02 LAB — BRAIN NATRIURETIC PEPTIDE: BRAIN NATRIURETIC PEPTIDE: 772.5 pg/mL — AB (ref <100)

## 2016-04-02 NOTE — Progress Notes (Signed)
OFFICE NOTE  Chief Complaint:  Shortness of breath, weight gain, leg swelling  Primary Care Physician: Salena Saner., MD  HPI:  Greg Booker is an 83 year old gentleman followed by Dr. Rex Kras with history of coronary disease in the past. Cardiac catheterization in 2006 showed an occluded right coronary with collaterals, EF 50%. A stress test in 2010 most recently was negative for ischemia. He has had trouble with DVT and has been off Coumadin for periods of time. Recently he had hip surgery, which he underwent uneventfully and has done well and is back on his Coumadin. Unfortunately, his levels were too high; as of January 3 his INR was 6.9.  It now seems to be more regulated. He is describing no symptoms of bleeding, shortness of breath, palpitations, presyncope, syncopal symptoms, chest pain, or any associated cardiac findings.   Greg Booker returns today and is feeling quite well. Unfortunately a few weeks ago he developed a pneumonia and was treated for this. He is now almost completely recovered. Fortunately, his warfarin levels have remained therapeutic. His gotten back to his activities and is looking forward to going on a cruise for the next week.  I had the pleasure of seeing Greg Booker back today in follow-up. He was last seen in 2015 and underwent a stress test which is negative for ischemia as part of a preoperative workup for hip replacement. He ultimately underwent hip replacement is doing very well. He denies any chest pain or worsening shortness of breath. I reviewed his medicines today noted that he is in chronic/permanent atrial fibrillation. His INR is a been stable and followed by Erasmo Downer, our anticoagulation pharmacist. He has been on long-term digoxin which was started by his previous cardiologist. He does have stage III chronic kidney disease. Recently there is data from a couple of very good studies which indicate that digoxin may increase the risk of mortality and that  there was a clear cut off of digoxin levels greater than 1.2 which wasn't associated with increased mortality, especially in patients with known coronary artery disease. He has not had a digoxin level tested in some time - it would be recommended that he has this testing done at least every 6 months.  06/12/2015  Greg Booker was seen back today in follow-up. Recently he was seen by Ignacia Bayley, NP, for imbalance, dizziness and difficulty walking. He denied any true syncopal episodes. [name] ordered an echocardiogram and monitor. The monitor showed rate controlled A. fib without any significant pauses or tachyarrhythmias. The echocardiogram showed moderate aortic stenosis with normal LV function. Neither study indicated a clear etiology of his imbalance and dizziness. Concern is for his fall risk as he is on warfarin anticoagulation. Greg Booker does report he has a history of low back pain and neurosurgery.  01/21/2015  Greg Booker was seen today in follow-up. He recently was admitted at [facility] for acute limb ischemia of the left leg. 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 He ultimately had a peripheral angiogram and underwent left femoral-tibial bypass. He has had some difficulty in wound healing and is getting home health care. During this hospitalization he had no worsening chest pain or unstable angina. He had a recent stress test which showed no ischemia but a fixed inferior defect and was started on Ranexa for some exertional chest pain. This seems to be working well for him. His A. fib was somewhat uncontrolled during his hospitalization and the rate is somewhere around 100  today. He is reestablishing on warfarin. He is not on an antiplatelet, but should be. Is also not had a recent lipid profile. In the past she's been intolerant to pravastatin, lovastatin and niacin causing myalgias and pruritus, respectively.  04/01/2016  Greg Booker returns today for follow-up. He is recently had  some worsening shortness of breath and lower extremity swelling. He had lower extremity arterial Dopplers this morning and I receive those preliminary reports indicating that his left arterial system is occluded. There is a significant reduction ABI of 0.57 on the right. He seems to have significant swelling on the right as well. He reports 2-3 pillow orthopnea which is new and shortness of breath with exertion. Weight is up to 194 from 189 pounds. EKG shows A. fib at 93 today with nonspecific ST and T-wave changes.   PMHx:  Past Medical History:  Diagnosis Date  . Anticoagulated on Coumadin   . Arthritis   . Calcified granuloma of lung ([place]) RIGHT LUNG BASE - STABLE PER CXR 03-03-2010  . Chronic back pain   . CKD (chronic kidney disease), stage III   . Coronary artery disease    a. 1997 PCI/BMS to the RCA; b. 2006 Cath: Occluded RCA with collat flow, EF 60%, otw nonobs dzs; b. 2010 MV: No ischemia; c. 06/2013 MV: low risk w/o ischemia.  . Diabetes mellitus without complication ([place])   . Falls   . GERD (gastroesophageal reflux disease)   . Heart murmur   . History of DVT (deep vein thrombosis)    a. Early 2000's.  Marland Kitchen History of pneumonia 01/2013  . Hyperlipidemia   . Hypertensive heart disease   . Impaired hearing BILATERAL AIDS  . Low back pain radiating to left leg   . Mitral regurgitation   . Nocturia   . Numbness and tingling of left leg   . PAF (paroxysmal atrial fibrillation) (HCC)    a. CHA2DS2VASc = 5-->chronic coumadin.  . Prostate cancer ([place]) RECUR   a. 2006 S/P radioactive seed implant.  . Sciatic nerve pain LEFT LEG PAIN--  EPI INJECTIONS  . Urgency of urination     Past Surgical History:  Procedure Laterality Date  . BACK SURGERY    . CARDIAC CATHETERIZATION  1998   IN-STENT RESTENOSIS-- LARGE COLLATERALS  . CARDIAC CATHETERIZATION  2003  &  2006   2006 ---  MITRAL REGURG/ 100% OCCULSION RCA WITH LARGE COLLATERALS/ MILD DISEASE LAD & CIRCUMFLEX  . CARDIOVASCULAR  STRESS TEST  06-03-2008   NORMAL STUDY/ LV NORMAL / COMPARED TO 12-26-2000 INFERIOR WALL ISCHEMIA IS NO LONGER PRESENT.  Marland Kitchen CARDIOVERSION  09-15-2005   UNSUCCESSFUL  . CORONARY ANGIOPLASTY WITH STENT PLACEMENT  1997- DR AL LITTLE   X1 STENT RCA  . CRYOABLATION  03/01/2011   Procedure: CRYO ABLATION PROSTATE;  Surgeon: Ailene Rud, MD;  Location: [facility];  Service: Urology;  Laterality: N/A;  . EXCISION BENIGN BREAST MASS  08-06-2008   LEFT  . JOINT REPLACEMENT    . LAMINECTOMY  10-28-2008   L3 - 4  . LUMBAR LAMINECTOMY/DECOMPRESSION MICRODISCECTOMY Right 04/05/2013   Procedure: Right Lumbar three-four Laminectomy ;  Surgeon: Charlie Pitter, MD;  Location: [place] [facility];  Service: Neurosurgery;  Laterality: Right;  . RADIOACTIVE SEED IMPLANT  08-03-2004   PROSTATE  . TOTAL HIP ARTHROPLASTY  11/02/2011   Procedure: TOTAL HIP ARTHROPLASTY;  Surgeon: Garald Balding, MD;  Location: [place];  Service: Orthopedics;  Laterality: Left;  Left Total  Hip Replacement  . TOTAL HIP ARTHROPLASTY Right 06/26/2013   Procedure: TOTAL HIP ARTHROPLASTY;  Surgeon: Garald Balding, MD;  Location: [place];  Service: Orthopedics;  Laterality: Right;  . TRANSTHORACIC ECHOCARDIOGRAM  07-26-2007   LVSF NORMAL/ EF 55%/ LEFT ATRIUM 4.5CM MILD TO MOD. DILATED/ MILD  MITRAL & TRICUSPID REGURG    FAMHx:  Family History  Problem Relation Age of Onset  . Arthritis Mother   . Other Mother 86  . Heart Problems Father   . Heart disease Brother   . Breast cancer Sister   . Cancer Sister   . Heart Problems Brother     SOCHx:   reports that he quit smoking about 44 years ago. His smoking use included Cigarettes and Pipe. He has a 25.00 pack-year smoking history. He has never used smokeless tobacco. He reports that he does not drink alcohol or use drugs.  ALLERGIES:  Allergies  Allergen Reactions  . Bicalutamide Other (See Comments)    Casodex caused increase in breast size  . Lovastatin  Other (See Comments)    Leg weakness  . Niacin And Related Other (See Comments)    Burning and stinging from tablets (otc capsules ok)  . Pravastatin Other (See Comments)    myalgias  . Beef Extract Rash  . Codeine Itching and Rash    ROS: Pertinent items noted in HPI and remainder of comprehensive ROS otherwise negative.  HOME MEDS: Current Outpatient Prescriptions  Medication Sig Dispense Refill  . aspirin EC 81 MG tablet Take 81 mg by mouth every other day.    . B Complex Vitamins (VITAMIN B COMPLEX PO) Take by mouth daily.    . bicalutamide (CASODEX) 50 MG tablet Take 50 mg by mouth daily.    . Cholecalciferol (VITAMIN D3) 1000 units CAPS Take by mouth daily.    Marland Kitchen diltiazem (DILACOR XR) 120 MG 24 hr capsule Take 120 mg by mouth daily.    . furosemide (LASIX) 40 MG tablet Take 1 tablet (40 mg total) by mouth 2 (two) times daily. 60 tablet 5  . gabapentin (NEURONTIN) 100 MG capsule Take 100 mg by mouth 3 (three) times daily.    . isosorbide mononitrate (IMDUR) 60 MG 24 hr tablet Take 60 mg by mouth daily.     . metoprolol succinate (TOPROL-XL) 50 MG 24 hr tablet Take 50 mg by mouth daily. Take with or immediately following a meal.    . Multiple Vitamins-Minerals (MULTIVITAMIN WITH MINERALS) tablet Take 1 tablet by mouth daily.    . nitroGLYCERIN (NITROSTAT) 0.4 MG SL tablet Place 0.4 mg under the tongue every 5 (five) minutes as needed for chest pain.    Marland Kitchen omeprazole (PRILOSEC) 20 MG capsule Take 20 mg by mouth daily.    . ranolazine (RANEXA) 500 MG 12 hr tablet Take 1 tablet (500 mg total) by mouth 2 (two) times daily. 60 tablet 5  . Tamsulosin HCl (FLOMAX) 0.4 MG CAPS Take 0.4 mg by mouth daily after supper.    . vitamin C (ASCORBIC ACID) 500 MG tablet Take 500 mg by mouth daily.    Marland Kitchen warfarin (COUMADIN) 5 MG tablet TAKE ONE-HALF TO ONE TABLETS BY MOUTH ONCE DAILY AS DIRECTED BY COUMADIN CLINIC 90 tablet 0   No current facility-administered medications for this visit.      LABS/IMAGING: Results for orders placed or performed in visit on 04/01/16 (from the past 48 hour(s))  Basic metabolic panel     Status: Abnormal   Collection  Time: 04/01/16  4:15 PM  Result Value Ref Range   Sodium 141 135 - 146 mmol/L   Potassium 3.8 3.5 - 5.3 mmol/L   Chloride 109 98 - 110 mmol/L   CO2 18 (L) 20 - 31 mmol/L   Glucose, Bld 119 (H) 65 - 99 mg/dL   BUN 27 (H) 7 - 25 mg/dL   Creat 1.85 (H) 0.70 - 1.11 mg/dL    Comment:   For patients > or = 81 years of age: The upper reference limit for Creatinine is approximately 13% higher for people identified as African-American.      Calcium 9.0 8.6 - 10.3 mg/dL  B Nat Peptide     Status: Abnormal   Collection Time: 04/01/16  4:15 PM  Result Value Ref Range   Brain Natriuretic Peptide 772.5 (H) <100 pg/mL    Comment:   BNP levels increase with age in the general population with the highest values seen in individuals greater than 52 years of age. Reference: Joellyn Rued Cardiol 2002; 81:191-47.      No results found.  VITALS: BP 122/64   Pulse 93   Ht 5\' 9"  (1.753 m)   Wt 194 lb (88 kg)   BMI 28.65 kg/m   EXAM: General appearance: alert and no distress Lungs: clear to auscultation bilaterally Heart: regular rate and rhythm, S1, S2 normal and systolic murmur: systolic ejection 3/6, crescendo at 2nd right intercostal space Extremities: extremities normal, atraumatic, no cyanosis or edema Neurologic: Mental status: Alert, oriented, thought content appropriate  EKG: Atrial fibrillation at 93, ST and T-wave changes  ASSESSMENT: 1. Acute on chronic systolic congestive heart failure 2. PAD with recent left critical limb ischemia-status post 3 digit amputation and left femoral-tibial bypass (Dr. Maryjean Morn at [facility]) 3. Dizziness, imbalance, frequent falls 4. Chronic atrial fibrillation - CHADSVASC score of 5 (anticoagulated on warfarin) 5. Coronary artery disease with occluded right coronary left to right collaterals, EF  50% 6. Moderate aortic stenosis. 7. Recent pneumonia 8. Hypertension 9. Dyslipidemia 10. Overweight   PLAN: 1.   Mr. Lovingood is an acute congestive heart failure on exam. JVP is elevated, there are some basilar crackles and lower extremity swelling. He's had new orthopnea. I'll plan to increase his Lasix to 40 mg twice a day. We'll check lab work including a metabolic profile and BNP. We'll schedule him for follow-up in the middle of next week. If he's not had significant diuresis or improvement in shortness of breath, he may likely need to be admitted for IV diuretics.  Pixie Casino, MD, [facility] Attending Cardiologist [place] [facility] Rayv[facility] Stumph 04/02/2016, 1:46 PM

## 2016-04-06 ENCOUNTER — Ambulatory Visit (HOSPITAL_COMMUNITY): Payer: Medicare Other | Attending: Cardiology

## 2016-04-06 ENCOUNTER — Other Ambulatory Visit: Payer: Self-pay

## 2016-04-06 DIAGNOSIS — I350 Nonrheumatic aortic (valve) stenosis: Secondary | ICD-10-CM | POA: Diagnosis not present

## 2016-04-06 DIAGNOSIS — I4891 Unspecified atrial fibrillation: Secondary | ICD-10-CM | POA: Insufficient documentation

## 2016-04-06 DIAGNOSIS — I272 Pulmonary hypertension, unspecified: Secondary | ICD-10-CM | POA: Diagnosis not present

## 2016-04-06 DIAGNOSIS — I420 Dilated cardiomyopathy: Secondary | ICD-10-CM | POA: Insufficient documentation

## 2016-04-06 DIAGNOSIS — I340 Nonrheumatic mitral (valve) insufficiency: Secondary | ICD-10-CM | POA: Insufficient documentation

## 2016-04-06 DIAGNOSIS — R0602 Shortness of breath: Secondary | ICD-10-CM

## 2016-04-06 DIAGNOSIS — I509 Heart failure, unspecified: Secondary | ICD-10-CM | POA: Diagnosis present

## 2016-04-07 ENCOUNTER — Other Ambulatory Visit: Payer: Self-pay | Admitting: *Deleted

## 2016-04-07 ENCOUNTER — Encounter: Payer: Self-pay | Admitting: Internal Medicine

## 2016-04-07 ENCOUNTER — Ambulatory Visit (INDEPENDENT_AMBULATORY_CARE_PROVIDER_SITE_OTHER): Payer: Medicare Other | Admitting: Internal Medicine

## 2016-04-07 VITALS — BP 132/68 | HR 114 | Ht 69.0 in | Wt 191.0 lb

## 2016-04-07 DIAGNOSIS — I482 Chronic atrial fibrillation, unspecified: Secondary | ICD-10-CM

## 2016-04-07 DIAGNOSIS — Z9889 Other specified postprocedural states: Secondary | ICD-10-CM

## 2016-04-07 DIAGNOSIS — I509 Heart failure, unspecified: Secondary | ICD-10-CM

## 2016-04-07 DIAGNOSIS — R0602 Shortness of breath: Secondary | ICD-10-CM

## 2016-04-07 MED ORDER — METOPROLOL SUCCINATE ER 25 MG PO TB24: 75.0000 mg | ORAL_TABLET | Freq: Every day | ORAL | 3 refills | Status: DC

## 2016-04-07 NOTE — Progress Notes (Signed)
OFFICE NOTE  Chief Complaint:  Breathing has improved  Primary Care Physician: Greg Booker., MD  HPI:  Greg Booker is an 83 year old gentleman followed by Greg Booker with history of coronary disease in the past. Cardiac catheterization in 2006 showed an occluded right coronary with collaterals, EF 50%. A stress test in 2010 most recently was negative for ischemia. He has had trouble with DVT and has been off Coumadin for periods of time. Recently he had hip surgery, which he underwent uneventfully and has done well and is back on his Coumadin. Unfortunately, his levels were too high; as of January 3 his INR was 6.9.  It now seems to be more regulated. He is describing no symptoms of bleeding, shortness of breath, palpitations, presyncope, syncopal symptoms, chest pain, or any associated cardiac findings.   Greg Booker returns today and is feeling quite well. Unfortunately a few weeks ago he developed a pneumonia and was treated for this. He is now almost completely recovered. Fortunately, his warfarin levels have remained therapeutic. His gotten back to his activities and is looking forward to going on a cruise for the next week.  I had the pleasure of seeing Greg Booker back today in follow-up. He was last seen in 2015 and underwent a stress test which is negative for ischemia as part of a preoperative workup for hip replacement. He ultimately underwent hip replacement is doing very well. He denies any chest pain or worsening shortness of breath. I reviewed his medicines today noted that he is in chronic/permanent atrial fibrillation. His INR is a been stable and followed by Greg Booker, our anticoagulation pharmacist. He has been on long-term digoxin which was started by his previous cardiologist. He does have stage III chronic kidney disease. Recently there is data from a couple of very good studies which indicate that digoxin may increase the risk of mortality and that there was a clear cut  off of digoxin levels greater than 1.2 which wasn't associated with increased mortality, especially in patients with known coronary artery disease. He has not had a digoxin level tested in some time - it would be recommended that he has this testing done at least every 6 months.  06/12/2015  Greg Booker was seen back today in follow-up. Recently he was seen by Greg Bayley, NP, for imbalance, dizziness and difficulty walking. He denied any true syncopal episodes. Greg Booker ordered an echocardiogram and monitor. The monitor showed rate controlled A. fib without any significant pauses or tachyarrhythmias. The echocardiogram showed moderate aortic stenosis with normal LV function. Neither study indicated a clear etiology of his imbalance and dizziness. Concern is for his fall risk as he is on warfarin anticoagulation. Greg Booker does report he has a history of low back pain and neurosurgery.  01/21/2015  Greg Booker was seen today in follow-up. He recently was admitted at [facility] for acute limb ischemia of the left leg. 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 He ultimately had a peripheral angiogram and underwent left femoral-tibial bypass. He has had some difficulty in wound healing and is getting home health care. During this hospitalization he had no worsening chest pain or unstable angina. He had a recent stress test which showed no ischemia but a fixed inferior defect and was started on Ranexa for some exertional chest pain. This seems to be working well for him. His A. fib was somewhat uncontrolled during his hospitalization and the rate is somewhere around 100 today. He is reestablishing  on warfarin. He is not on an antiplatelet, but should be. Is also not had a recent lipid profile. In the past she's been intolerant to pravastatin, lovastatin and niacin causing myalgias and pruritus, respectively.  04/01/2016  Greg Booker returns today for follow-up. He is recently had some worsening  shortness of breath and lower extremity swelling. He had lower extremity arterial Dopplers this morning and I receive those preliminary reports indicating that his left arterial system is occluded. There is a significant reduction ABI of 0.57 on the right. He seems to have significant swelling on the right as well. He reports 2-3 pillow orthopnea which is new and shortness of breath with exertion. Weight is up to 194 from 189 pounds. EKG shows A. fib at 93 today with nonspecific ST and T-wave changes.   04/07/2016  Greg Booker returns today for follow-up. Since I last saw him last week he has had improvement in his breathing. He's not needed as many pillows to sleep at night. Weight has decreased from 194-191 pounds with his dry weight somewhere around 189 pounds. He is in A. fib with RVR today and was at a much faster rate yesterday when he had his echo performed. His echocardiogram did show a newly reduced EF to 45% with some diffuse hypokinesis. I suspect this is related to tachycardia related to A. fib. BNP was elevated over 700 with an elevated creatinine to 1.85. He is scheduled to see his nephrologist tomorrow. Hopefully that will repeat his creatinine at that time.  PMHx:  Past Medical History:  Diagnosis Date  . Anticoagulated on Coumadin   . Arthritis   . Calcified granuloma of lung ([place]) RIGHT LUNG BASE - STABLE PER CXR 03-03-2010  . Chronic back pain   . CKD (chronic kidney disease), stage III   . Coronary artery disease    a. 1997 PCI/BMS to the RCA; b. 2006 Cath: Occluded RCA with collat flow, EF 60%, otw nonobs dzs; b. 2010 MV: No ischemia; c. 06/2013 MV: low risk w/o ischemia.  . Diabetes mellitus without complication ([place])   . Falls   . GERD (gastroesophageal reflux disease)   . Heart murmur   . History of DVT (deep vein thrombosis)    a. Early 2000's.  Marland Kitchen History of pneumonia 01/2013  . Hyperlipidemia   . Hypertensive heart disease   . Impaired hearing BILATERAL AIDS  . Low back  pain radiating to left leg   . Mitral regurgitation   . Nocturia   . Numbness and tingling of left leg   . PAF (paroxysmal atrial fibrillation) (HCC)    a. CHA2DS2VASc = 5-->chronic coumadin.  . Prostate cancer ([place]) RECUR   a. 2006 S/P radioactive seed implant.  . Sciatic nerve pain LEFT LEG PAIN--  EPI INJECTIONS  . Urgency of urination     Past Surgical History:  Procedure Laterality Date  . BACK SURGERY    . CARDIAC CATHETERIZATION  1998   IN-STENT RESTENOSIS-- LARGE COLLATERALS  . CARDIAC CATHETERIZATION  2003  &  2006   2006 ---  MITRAL REGURG/ 100% OCCULSION RCA WITH LARGE COLLATERALS/ MILD DISEASE LAD & CIRCUMFLEX  . CARDIOVASCULAR STRESS TEST  06-03-2008   NORMAL STUDY/ LV NORMAL / COMPARED TO 12-26-2000 INFERIOR WALL ISCHEMIA IS NO LONGER PRESENT.  Marland Kitchen CARDIOVERSION  09-15-2005   UNSUCCESSFUL  . CORONARY ANGIOPLASTY WITH STENT PLACEMENT  1997- DR AL LITTLE   X1 STENT RCA  . CRYOABLATION  03/01/2011   Procedure: CRYO ABLATION PROSTATE;  Surgeon: Ailene Rud, MD;  Location: [facility];  Service: Urology;  Laterality: N/A;  . EXCISION BENIGN BREAST MASS  08-06-2008   LEFT  . JOINT REPLACEMENT    . LAMINECTOMY  10-28-2008   L3 - 4  . LUMBAR LAMINECTOMY/DECOMPRESSION MICRODISCECTOMY Right 04/05/2013   Procedure: Right Lumbar three-four Laminectomy ;  Surgeon: Charlie Pitter, MD;  Location: [place] [facility];  Service: Neurosurgery;  Laterality: Right;  . RADIOACTIVE SEED IMPLANT  08-03-2004   PROSTATE  . TOTAL HIP ARTHROPLASTY  11/02/2011   Procedure: TOTAL HIP ARTHROPLASTY;  Surgeon: Garald Balding, MD;  Location: [place];  Service: Orthopedics;  Laterality: Left;  Left Total Hip Replacement  . TOTAL HIP ARTHROPLASTY Right 06/26/2013   Procedure: TOTAL HIP ARTHROPLASTY;  Surgeon: Garald Balding, MD;  Location: [place];  Service: Orthopedics;  Laterality: Right;  . TRANSTHORACIC ECHOCARDIOGRAM  07-26-2007   LVSF NORMAL/ EF 55%/ LEFT ATRIUM 4.5CM MILD TO  MOD. DILATED/ MILD  MITRAL & TRICUSPID REGURG    FAMHx:  Family History  Problem Relation Age of Onset  . Arthritis Mother   . Other Mother 69  . Heart Problems Father   . Heart disease Brother   . Breast cancer Sister   . Cancer Sister   . Heart Problems Brother     SOCHx:   reports that he quit smoking about 44 years ago. His smoking use included Cigarettes and Pipe. He has a 25.00 pack-year smoking history. He has never used smokeless tobacco. He reports that he does not drink alcohol or use drugs.  ALLERGIES:  Allergies  Allergen Reactions  . Bicalutamide Other (See Comments)    Casodex caused increase in breast size  . Lovastatin Other (See Comments)    Leg weakness  . Niacin And Related Other (See Comments)    Burning and stinging from tablets (otc capsules ok)  . Pravastatin Other (See Comments)    myalgias  . Beef Extract Rash  . Codeine Itching and Rash    ROS: Pertinent items noted in HPI and remainder of comprehensive ROS otherwise negative.  HOME MEDS: Current Outpatient Prescriptions  Medication Sig Dispense Refill  . aspirin EC 81 MG tablet Take 81 mg by mouth every other day.    . B Complex Vitamins (VITAMIN B COMPLEX PO) Take by mouth daily.    . bicalutamide (CASODEX) 50 MG tablet Take 50 mg by mouth daily.    . Cholecalciferol (VITAMIN D3) 1000 units CAPS Take by mouth daily.    Marland Kitchen diltiazem (DILACOR XR) 120 MG 24 hr capsule Take 120 mg by mouth daily.    . furosemide (LASIX) 40 MG tablet Take 1 tablet (40 mg total) by mouth 2 (two) times daily. 60 tablet 5  . gabapentin (NEURONTIN) 100 MG capsule Take 100 mg by mouth 3 (three) times daily.    . isosorbide mononitrate (IMDUR) 60 MG 24 hr tablet Take 60 mg by mouth daily.     . metoprolol succinate (TOPROL-XL) 50 MG 24 hr tablet Take 50 mg by mouth daily. Take with or immediately following a meal.    . Multiple Vitamins-Minerals (MULTIVITAMIN WITH MINERALS) tablet Take 1 tablet by mouth daily.    .  nitroGLYCERIN (NITROSTAT) 0.4 MG SL tablet Place 0.4 mg under the tongue every 5 (five) minutes as needed for chest pain.    Marland Kitchen omeprazole (PRILOSEC) 20 MG capsule Take 20 mg by mouth daily.    . ranolazine (RANEXA) 500 MG 12  hr tablet Take 1 tablet (500 mg total) by mouth 2 (two) times daily. 60 tablet 5  . Tamsulosin HCl (FLOMAX) 0.4 MG CAPS Take 0.4 mg by mouth daily after supper.    . vitamin C (ASCORBIC ACID) 500 MG tablet Take 500 mg by mouth daily.    Marland Kitchen warfarin (COUMADIN) 5 MG tablet TAKE ONE-HALF TO ONE TABLETS BY MOUTH ONCE DAILY AS DIRECTED BY COUMADIN CLINIC 90 tablet 0   No current facility-administered medications for this visit.     LABS/IMAGING: No results found for this or any previous visit (from the past 48 hour(s)). No results found.  VITALS: BP 132/68   Pulse (!) 114   Ht 5\' 9"  (1.753 m)   Wt 191 lb (86.6 kg)   BMI 28.21 kg/m   EXAM: General appearance: alert and no distress Lungs: clear to auscultation bilaterally Heart: regular rate and rhythm, S1, S2 normal and systolic murmur: systolic ejection 3/6, crescendo at 2nd right intercostal space Extremities: extremities normal, atraumatic, no cyanosis or edema Neurologic: Mental status: Alert, oriented, thought content appropriate  EKG: Atrial fibrillation with RVR at 114  ASSESSMENT: 1. Acute on chronic systolic congestive heart failure - LVEF decreased to 45% (global HK) 03/2016 2. PAD with recent left critical limb ischemia-status post 3 digit amputation and left femoral-tibial bypass (Dr. Maryjean Morn at [facility]) 3. Dizziness, imbalance, frequent falls 4. Chronic atrial fibrillation - CHADSVASC score of 5 (anticoagulated on warfarin) 5. Coronary artery disease with occluded right coronary left to right collaterals, EF 50% 6. Moderate aortic stenosis. 7. Recent pneumonia 8. Hypertension 9. Dyslipidemia 10. Overweight   PLAN: 1.   Greg Booker had recent acute on chronic systolic congestive heart failure with EF now  45%. Weight gain was about 6 pounds however he's lost 3-4 pounds over the last several days. I would recommend continuing Lasix 40 mg twice daily. BNP was elevated over 700. Creatinine recently was elevated at 1.85 however may improve with diuresis. He is scheduled to see his nephrologist in [facility] tomorrow. He remains in RVR, for which will increase his metoprolol to 75 mg daily. This should help both with his heart failure and rate. Given his chronic kidney disease he's not likely candidate for ACE inhibitor or ARB. We'll continue diltiazem but I would favor increasing his beta blocker over his calcium channel blocker with congestive heart failure.  Follow-up one month.  Pixie Casino, MD, [facility] Attending Cardiologist Bondurant C Hilty 04/07/2016, 9:52 AM

## 2016-04-07 NOTE — Patient Instructions (Signed)
Medication Instructions:  INCREASE Toprol to 75mg  (1.5 tablets) daily.  Follow-Up: Your physician recommends that you schedule a follow-up appointment in: 1 MONTH with Dr. Debara Pickett.   Any Other Special Instructions Will Be Listed Below (If Applicable).     If you need a refill on your cardiac medications before your next appointment, please call your pharmacy.

## 2016-04-08 ENCOUNTER — Telehealth: Payer: Self-pay | Admitting: Internal Medicine

## 2016-04-08 NOTE — Telephone Encounter (Signed)
New Message    Kathrine Haddock, RN with [facility] health was told by patient to call Dr. Lysbeth Penner office because they needed to talk to with her. She was thinking it may have been regarding to patient's recent appt.Marland KitchenMarland Kitchen

## 2016-04-08 NOTE — Telephone Encounter (Signed)
I do not see(also asked Dr [facility] nurse) why pt told this nurse to call and spoke with her and she states that pt does not know why either she states that pt is stable and is down with his weight today about 4# per nurse.   She will keep [place] updated and we can call her if anything is needed from her.

## 2016-04-15 ENCOUNTER — Ambulatory Visit (INDEPENDENT_AMBULATORY_CARE_PROVIDER_SITE_OTHER): Payer: Medicare Other | Admitting: Pharmacist Clinician (PhC)/ Clinical Pharmacy Specialist

## 2016-04-15 DIAGNOSIS — Z7901 Long term (current) use of anticoagulants: Secondary | ICD-10-CM

## 2016-04-15 DIAGNOSIS — I482 Chronic atrial fibrillation, unspecified: Secondary | ICD-10-CM

## 2016-04-15 LAB — POCT INR: INR: 2.8

## 2016-04-29 ENCOUNTER — Ambulatory Visit (INDEPENDENT_AMBULATORY_CARE_PROVIDER_SITE_OTHER): Payer: Medicare Other | Admitting: Pharmacist

## 2016-04-29 DIAGNOSIS — I482 Chronic atrial fibrillation, unspecified: Secondary | ICD-10-CM

## 2016-04-29 DIAGNOSIS — Z7901 Long term (current) use of anticoagulants: Secondary | ICD-10-CM

## 2016-04-29 LAB — POCT INR: INR: 2.1

## 2016-05-07 ENCOUNTER — Emergency Department (HOSPITAL_COMMUNITY): Payer: Medicare Other

## 2016-05-07 ENCOUNTER — Emergency Department (HOSPITAL_COMMUNITY)
Admission: EM | Admit: 2016-05-07 | Discharge: 2016-05-07 | Disposition: A | Discharge status: Home / Self Care | Payer: Medicare Other | Attending: Emergency Medicine | Admitting: Emergency Medicine

## 2016-05-07 ENCOUNTER — Encounter (HOSPITAL_COMMUNITY): Payer: Self-pay | Admitting: Emergency Medicine

## 2016-05-07 DIAGNOSIS — Y929 Unspecified place or not applicable: Secondary | ICD-10-CM | POA: Insufficient documentation

## 2016-05-07 DIAGNOSIS — I2510 Atherosclerotic heart disease of native coronary artery without angina pectoris: Secondary | ICD-10-CM | POA: Diagnosis not present

## 2016-05-07 DIAGNOSIS — Z7901 Long term (current) use of anticoagulants: Secondary | ICD-10-CM | POA: Insufficient documentation

## 2016-05-07 DIAGNOSIS — Z96641 Presence of right artificial hip joint: Secondary | ICD-10-CM | POA: Diagnosis not present

## 2016-05-07 DIAGNOSIS — W1830XA Fall on same level, unspecified, initial encounter: Secondary | ICD-10-CM | POA: Insufficient documentation

## 2016-05-07 DIAGNOSIS — E1122 Type 2 diabetes mellitus with diabetic chronic kidney disease: Secondary | ICD-10-CM | POA: Diagnosis not present

## 2016-05-07 DIAGNOSIS — N183 Chronic kidney disease, stage 3 (moderate): Secondary | ICD-10-CM | POA: Insufficient documentation

## 2016-05-07 DIAGNOSIS — Z7982 Long term (current) use of aspirin: Secondary | ICD-10-CM | POA: Insufficient documentation

## 2016-05-07 DIAGNOSIS — S3992XA Unspecified injury of lower back, initial encounter: Secondary | ICD-10-CM | POA: Diagnosis present

## 2016-05-07 DIAGNOSIS — Z8546 Personal history of malignant neoplasm of prostate: Secondary | ICD-10-CM | POA: Diagnosis not present

## 2016-05-07 DIAGNOSIS — S32048A Other fracture of fourth lumbar vertebra, initial encounter for closed fracture: Secondary | ICD-10-CM | POA: Diagnosis not present

## 2016-05-07 DIAGNOSIS — Y939 Activity, unspecified: Secondary | ICD-10-CM | POA: Insufficient documentation

## 2016-05-07 DIAGNOSIS — S32040A Wedge compression fracture of fourth lumbar vertebra, initial encounter for closed fracture: Secondary | ICD-10-CM

## 2016-05-07 DIAGNOSIS — R51 Headache: Secondary | ICD-10-CM | POA: Insufficient documentation

## 2016-05-07 DIAGNOSIS — Z87891 Personal history of nicotine dependence: Secondary | ICD-10-CM | POA: Diagnosis not present

## 2016-05-07 DIAGNOSIS — Y999 Unspecified external cause status: Secondary | ICD-10-CM | POA: Diagnosis not present

## 2016-05-07 DIAGNOSIS — Z955 Presence of coronary angioplasty implant and graft: Secondary | ICD-10-CM | POA: Insufficient documentation

## 2016-05-07 LAB — CBC WITH DIFFERENTIAL/PLATELET
Basophils Absolute: 0 10*3/uL (ref 0.0–0.1)
Basophils Relative: 0 %
Eosinophils Absolute: 0.3 10*3/uL (ref 0.0–0.7)
Eosinophils Relative: 4 %
HCT: 30.3 % — ABNORMAL LOW (ref 39.0–52.0)
Hemoglobin: 10.4 g/dL — ABNORMAL LOW (ref 13.0–17.0)
Lymphocytes Relative: 22 %
Lymphs Abs: 2 10*3/uL (ref 0.7–4.0)
MCH: 32.7 pg (ref 26.0–34.0)
MCHC: 34.3 g/dL (ref 30.0–36.0)
MCV: 95.3 fL (ref 78.0–100.0)
Monocytes Absolute: 0.5 10*3/uL (ref 0.1–1.0)
Monocytes Relative: 5 %
Neutro Abs: 6.1 10*3/uL (ref 1.7–7.7)
Neutrophils Relative %: 69 %
Platelets: 259 10*3/uL (ref 150–400)
RBC: 3.18 MIL/uL — ABNORMAL LOW (ref 4.22–5.81)
RDW: 15.1 % (ref 11.5–15.5)
WBC: 8.9 10*3/uL (ref 4.0–10.5)

## 2016-05-07 LAB — BASIC METABOLIC PANEL
Anion gap: 10 (ref 5–15)
BUN: 44 mg/dL — ABNORMAL HIGH (ref 6–20)
CO2: 21 mmol/L — ABNORMAL LOW (ref 22–32)
Calcium: 9.1 mg/dL (ref 8.9–10.3)
Chloride: 106 mmol/L (ref 101–111)
Creatinine, Ser: 2.18 mg/dL — ABNORMAL HIGH (ref 0.61–1.24)
GFR calc Af Amer: 30 mL/min — ABNORMAL LOW (ref >60)
GFR calc non Af Amer: 26 mL/min — ABNORMAL LOW (ref >60)
Glucose, Bld: 85 mg/dL (ref 65–99)
Potassium: 3.5 mmol/L (ref 3.5–5.1)
Sodium: 137 mmol/L (ref 135–145)

## 2016-05-07 LAB — PROTIME-INR
INR: 2.11
Prothrombin Time: 24 seconds — ABNORMAL HIGH (ref 11.4–15.2)

## 2016-05-07 MED ORDER — LORAZEPAM 2 MG/ML IJ SOLN
0.5000 mg | Freq: Once | INTRAMUSCULAR | Status: AC
  Administered 2016-05-07: 0.5 mg via INTRAVENOUS
  Filled 2016-05-07: qty 1

## 2016-05-07 MED ORDER — OXYCODONE-ACETAMINOPHEN 5-325 MG PO TABS: 1.0000 | ORAL_TABLET | Freq: Four times a day (QID) | ORAL | 0 refills | Status: DC | PRN

## 2016-05-07 MED ORDER — MORPHINE SULFATE (PF) 4 MG/ML IV SOLN
4.0000 mg | Freq: Once | INTRAVENOUS | Status: AC
  Administered 2016-05-07: 4 mg via INTRAVENOUS
  Filled 2016-05-07: qty 1

## 2016-05-07 MED ORDER — DOCUSATE SODIUM 100 MG PO CAPS: 100.0000 mg | ORAL_CAPSULE | Freq: Two times a day (BID) | ORAL | 0 refills | Status: DC | PRN

## 2016-05-07 NOTE — ED Notes (Signed)
Pts family updated on wait time

## 2016-05-07 NOTE — ED Triage Notes (Signed)
Pt reports he got out of bed this am, walking with his walker and fell backwards. Pt c/o lower back pain, states he did hit his head and is on coumadin. Pt a/ox4, resp e/u, nad.

## 2016-05-07 NOTE — ED Provider Notes (Signed)
[facility] DEPT Provider Note   CSN: [id number] Arrival date & time: 05/07/16  1412   By signing my name below, I, Evelene Croon, attest that this documentation has been prepared under the direction and in the presence of Virgel Manifold, MD . Electronically Signed: Evelene Croon, Scribe. 05/07/2016. 5:21 PM.  History   Chief Complaint Chief Complaint  Patient presents with  . Fall    The history is provided by the patient. No language interpreter was used.     HPI Comments:  Greg Booker is a 83 y.o. male with a history of HTN, CHF, and back surgery, who presents to the Emergency Department s/p fall ~0700 this AM complaining of constant lower back pain since the incident. Pain is worse with movement. He states he fell backwards after getting out of bed. He was getting ready to walk with the aid of his walker when he went down. He states he struck the back of his head but denies LOC. Pt reports associated abrasion to the left elbow.  He denies nausea, vision change, numbness/tingling, HA, and neck pain. No alleviating factors noted. Pt is currently on coumadin for his AFIB.    Past Medical History:  Diagnosis Date  . Anticoagulated on Coumadin   . Arthritis   . Calcified granuloma of lung ([place]) RIGHT LUNG BASE - STABLE PER CXR 03-03-2010  . Chronic back pain   . CKD (chronic kidney disease), stage III   . Coronary artery disease    a. 1997 PCI/BMS to the RCA; b. 2006 Cath: Occluded RCA with collat flow, EF 60%, otw nonobs dzs; b. 2010 MV: No ischemia; c. 06/2013 MV: low risk w/o ischemia.  . Diabetes mellitus without complication ([place])   . Falls   . GERD (gastroesophageal reflux disease)   . Heart murmur   . History of DVT (deep vein thrombosis)    a. Early 2000's.  Marland Kitchen History of pneumonia 01/2013  . Hyperlipidemia   . Hypertensive heart disease   . Impaired hearing BILATERAL AIDS  . Low back pain radiating to left leg   . Mitral regurgitation   . Nocturia   . Numbness  and tingling of left leg   . PAF (paroxysmal atrial fibrillation) (H[facility])    a. CHA2DS2VASc = 5-->chronic coumadin.  . Prostate cancer ([place]) RECUR   a. 2006 S/P radioactive seed implant.  . Sciatic nerve pain LEFT LEG PAIN--  EPI INJECTIONS  . Urgency of urination     Patient Active Problem List   Diagnosis Date Noted  . Acute congestive heart failure ([place]) 04/02/2016  . Aortic valve stenosis 04/02/2016  . Shortness of breath 04/02/2016  . Dyslipidemia 01/21/2016  . S/P peripheral artery bypass 01/21/2016  . PAD (peripheral artery disease) ([place]) 01/21/2016  . Coronary artery disease of native artery of native heart with stable angina pectoris ([place]) 01/21/2016  . Chest pain with moderate risk of acute coronary syndrome 10/07/2015  . Recurrent falls 06/12/2015  . Imbalance 06/12/2015  . Hypertensive heart disease without heart failure 06/12/2015  . Hypertensive heart disease   . Falls   . Avascular necrosis of bone of right hip ([place]) 06/28/2013  . S/P total hip arthroplasty 06/26/2013  . Lumbosacral spondylosis without myelopathy 04/05/2013  . Spondylosis of lumbar joint 04/05/2013  . bicalutamide-induced gynecomastia 10/17/2012  . Hypokalemia 11/04/2011  . DVT (deep venous thrombosis) Jan 2013 11/03/2011  . Chronic anticoagulation 11/03/2011  . CAD S/P percutaneous coronary angioplasty 11/03/2011  . HTN (hypertension) 11/03/2011  .  Chronic renal insufficiency, stage III (moderate) 11/03/2011  . Avascular necrosis of femoral head, s/p [facility] 10/28/2011  . Chronic a-fib ([place]) 10/28/2011  . Prostate cancer ([place]) 03/01/2011    Past Surgical History:  Procedure Laterality Date  . BACK SURGERY    . CARDIAC CATHETERIZATION  1998   IN-STENT RESTENOSIS-- LARGE COLLATERALS  . CARDIAC CATHETERIZATION  2003  &  2006   2006 ---  MITRAL REGURG/ 100% OCCULSION RCA WITH LARGE COLLATERALS/ MILD DISEASE LAD & CIRCUMFLEX  . CARDIOVASCULAR STRESS TEST  06-03-2008   NORMAL STUDY/ LV NORMAL /  COMPARED TO 12-26-2000 INFERIOR WALL ISCHEMIA IS NO LONGER PRESENT.  Marland Kitchen CARDIOVERSION  09-15-2005   UNSUCCESSFUL  . CORONARY ANGIOPLASTY WITH STENT PLACEMENT  1997- DR AL LITTLE   X1 STENT RCA  . CRYOABLATION  03/01/2011   Procedure: CRYO ABLATION PROSTATE;  Surgeon: Ailene Rud, MD;  Location: [name];  Service: Urology;  Laterality: N/A;  . EXCISION BENIGN BREAST MASS  08-06-2008   LEFT  . JOINT REPLACEMENT    . LAMINECTOMY  10-28-2008   L3 - 4  . LUMBAR LAMINECTOMY/DECOMPRESSION MICRODISCECTOMY Right 04/05/2013   Procedure: Right Lumbar three-four Laminectomy ;  Surgeon: Charlie Pitter, MD;  Location: [facility];  Service: Neurosurgery;  Laterality: Right;  . RADIOACTIVE SEED IMPLANT  08-03-2004   PROSTATE  . TOTAL HIP ARTHROPLASTY  11/02/2011   Procedure: TOTAL HIP ARTHROPLASTY;  Surgeon: Garald Balding, MD;  Location: [facility];  Service: Orthopedics;  Laterality: Left;  Left Total Hip Replacement  . TOTAL HIP ARTHROPLASTY Right 06/26/2013   Procedure: TOTAL HIP ARTHROPLASTY;  Surgeon: Garald Balding, MD;  Location: [facility];  Service: Orthopedics;  Laterality: Right;  . TRANSTHORACIC ECHOCARDIOGRAM  07-26-2007   LVSF NORMAL/ EF 55%/ LEFT ATRIUM 4.5CM MILD TO MOD. DILATED/ MILD  MITRAL & TRICUSPID REGURG       Home Medications    Prior to Admission medications   Medication Sig Start Date End Date Taking? Authorizing Provider  aspirin EC 81 MG tablet Take 81 mg by mouth daily.    Yes Historical Provider, MD  B Complex Vitamins (VITAMIN B COMPLEX PO) Take 1 tablet by mouth daily.    Yes Historical Provider, MD  bicalutamide (CASODEX) 50 MG tablet Take 50 mg by mouth daily. 04/12/14  Yes Historical Provider, MD  Cholecalciferol (VITAMIN D3) 5000 units CAPS Take 5,000 Units by mouth daily.   Yes Historical Provider, MD  diltiazem (CARDIZEM CD) 120 MG 24 hr capsule Take 120 mg by mouth daily. 04/02/16  Yes Historical Provider, MD  furosemide (LASIX) 40 MG  tablet Take 1 tablet (40 mg total) by mouth 2 (two) times daily. 04/01/16  Yes Pixie Casino, MD  gabapentin (NEURONTIN) 100 MG capsule Take 300 mg by mouth at bedtime.  03/19/16  Yes Historical Provider, MD  isosorbide mononitrate (IMDUR) 60 MG 24 hr tablet Take 60 mg by mouth daily.    Yes Historical Provider, MD  metoprolol succinate (TOPROL-XL) 25 MG 24 hr tablet Take 3 tablets (75 mg total) by mouth daily. Take with or immediately following a meal. 04/07/16  Yes Pixie Casino, MD  Multiple Vitamins-Minerals (MULTIVITAMIN WITH MINERALS) tablet Take 1 tablet by mouth daily.   Yes Historical Provider, MD  nitroGLYCERIN (NITROSTAT) 0.4 MG SL tablet Place 0.4 mg under the tongue every 5 (five) minutes as needed for chest pain.   Yes Historical Provider, MD  omeprazole (PRILOSEC) 20 MG capsule Take 20  mg by mouth daily. 05/18/12  Yes Historical Provider, MD  ranolazine (RANEXA) 500 MG 12 hr tablet Take 1 tablet (500 mg total) by mouth 2 (two) times daily. 10/14/15  Yes Erlene Quan, PA-C  Tamsulosin HCl (FLOMAX) 0.4 MG CAPS Take 0.4 mg by mouth daily after supper.   Yes Historical Provider, MD  vitamin C (ASCORBIC ACID) 500 MG tablet Take 500 mg by mouth daily.   Yes Historical Provider, MD  warfarin (COUMADIN) 5 MG tablet TAKE ONE-HALF TO ONE TABLETS BY MOUTH ONCE DAILY AS DIRECTED BY COUMADIN CLINIC Patient taking differently: Take 2.5 mg by mouth in the evening on Sun/Tues/Thurs/Sat and 5 mg on Mon/Wed/Fri 01/28/16  Yes Mihai Croitoru, MD    Family History Family History  Problem Relation Age of Onset  . Arthritis Mother   . Other Mother 44  . Heart Problems Father   . Heart disease Brother   . Breast cancer Sister   . Cancer Sister   . Heart Problems Brother     Social History Social History  Substance Use Topics  . Smoking status: Former Smoker    Packs/day: 1.00    Years: 25.00    Types: Cigarettes, Pipe    Quit date: 02/23/1972  . Smokeless tobacco: Never Used  . Alcohol use No       Allergies   Bicalutamide; Lovastatin; Niacin and related; Pravastatin; Beef extract; and Codeine   Review of Systems Review of Systems  Eyes: Negative for visual disturbance.  Gastrointestinal: Negative for nausea and vomiting.  Musculoskeletal: Positive for back pain. Negative for neck pain.  Neurological: Negative for syncope, numbness and headaches.  All other systems reviewed and are negative.  Physical Exam Updated Vital Signs BP 123/66   Pulse 67   Temp 97.8 F (36.6 C) (Oral)   Resp 14   SpO2 97%   Physical Exam  Constitutional: He is oriented to person, place, and time. He appears well-developed and well-nourished. No distress.  HENT:  Head: Normocephalic and atraumatic.  Eyes: EOM are normal.  Neck: Normal range of motion.  Cardiovascular: Normal rate, normal heart sounds and intact distal pulses.  An irregularly irregular rhythm present.  Pulmonary/Chest: Effort normal and breath sounds normal. No respiratory distress.  Abdominal: Soft. He exhibits no distension. There is no tenderness.  Musculoskeletal: Normal range of motion. He exhibits edema and tenderness.  Mid to lower midline lumbar tenderness BLE edema L>R   Neurological: He is alert and oriented to person, place, and time. He has normal strength. No cranial nerve deficit or sensory deficit. He exhibits normal muscle tone.  Skin: Skin is warm and dry.  Small skin tear to left elbow   Psychiatric: He has a normal mood and affect. Judgment normal.  Nursing note and vitals reviewed.    ED Treatments / Results  DIAGNOSTIC STUDIES:  Oxygen Saturation is 100% on RA, normal by my interpretation.    COORDINATION OF CARE:  5:21 PM Discussed treatment plan with pt at bedside and pt agreed to plan.  Labs (all labs ordered are listed, but only abnormal results are displayed) Labs Reviewed  PROTIME-INR - Abnormal; Notable for the following:       Result Value   Prothrombin Time 24.0 (*)    All  other components within normal limits  CBC WITH DIFFERENTIAL/PLATELET - Abnormal; Notable for the following:    RBC 3.18 (*)    Hemoglobin 10.4 (*)    HCT 30.3 (*)    All other  components within normal limits  BASIC METABOLIC PANEL - Abnormal; Notable for the following:    CO2 21 (*)    BUN 44 (*)    Creatinine, Ser 2.18 (*)    GFR calc non Af Amer 26 (*)    GFR calc Af Amer 30 (*)    All other components within normal limits    EKG  EKG Interpretation None       Radiology Dg Lumbar Spine Complete  Result Date: 05/07/2016 CLINICAL DATA:  Fall with back pain.  History of previous fractures. EXAM: LUMBAR SPINE - COMPLETE 4+ VIEW COMPARISON:  CT 12/11/2015 FINDINGS: There are old partial compression fractures at each level throughout the lumbar spine. I suspect that there may be acute extension at the superior endplate L4. Loss of height is no more than 20%. No other acute finding suspected. IMPRESSION: Suspicion of acute superior endplate fracture at L4 with loss of height of 20% or less. Old partial compression fractures at all other lumbar levels. Electronically Signed   By: Nelson Chimes M.D.   On: 05/07/2016 18:08   Ct Head Wo Contrast  Result Date: 05/07/2016 CLINICAL DATA:  Anti coagulated.  Fell backwards striking the head. EXAM: CT HEAD WITHOUT CONTRAST TECHNIQUE: Contiguous axial images were obtained from the base of the skull through the vertex without intravenous contrast. COMPARISON:  None. FINDINGS: Brain: Generalized brain atrophy. Chronic small-vessel ischemic changes of the cerebral hemispheric white matter. No sign of acute infarction, mass lesion, hemorrhage, hydrocephalus or extra-axial collection. No skull fracture. Vascular: There is atherosclerotic calcification of the major vessels at the base of the brain. Skull: Negative Sinuses/Orbits: Clear/normal Other: None IMPRESSION: No acute or traumatic finding. Atrophy and chronic small-vessel ischemic changes. No evidence  of intracranial hemorrhage. Electronically Signed   By: Nelson Chimes M.D.   On: 05/07/2016 18:24    Procedures Procedures (including critical care time)  Medications Ordered in ED Medications  morphine 4 MG/ML injection 4 mg (4 mg Intravenous Given 05/07/16 1747)  LORazepam (ATIVAN) injection 0.5 mg (0.5 mg Intravenous Given 05/07/16 1747)     Initial Impression / Assessment and Plan / ED Course  I have reviewed the triage vital signs and the nursing notes.  Pertinent labs & imaging results that were available during my care of the patient were reviewed by me and considered in my medical decision making (see chart for details).     87yM with compression fx. Nonfocal neuro exam. Pain reasonably controlled. Has walkers. He feels reasonably comfortable to go home. PRN pain meds. Outpt FU.   Final Clinical Impressions(s) / ED Diagnoses   Final diagnoses:  Closed compression fracture of fourth lumbar vertebra, initial encounter [facility])    New Prescriptions New Prescriptions   No medications on file   I personally preformed the services scribed in my presence. The recorded information has been reviewed is accurate. Virgel Manifold, MD.      Virgel Manifold, MD 05/12/16 1006

## 2016-05-07 NOTE — ED Notes (Signed)
Patient to xray.

## 2016-05-07 NOTE — ED Notes (Signed)
Patient Alert and oriented X4. Stable and ambulatory. Patient verbalized understanding of the discharge instructions.  Patient belongings were taken by the patient.  

## 2016-05-13 ENCOUNTER — Ambulatory Visit: Payer: Medicare Other | Admitting: Internal Medicine

## 2016-05-24 ENCOUNTER — Encounter: Payer: Self-pay | Admitting: Pharmacist Clinician (PhC)/ Clinical Pharmacy Specialist

## 2016-05-24 DIAGNOSIS — Z7901 Long term (current) use of anticoagulants: Secondary | ICD-10-CM

## 2016-05-24 DIAGNOSIS — I482 Chronic atrial fibrillation, unspecified: Secondary | ICD-10-CM

## 2016-05-24 NOTE — Progress Notes (Signed)
This encounter was created in error - please disregard.

## 2016-06-29 ENCOUNTER — Encounter: Payer: Self-pay | Admitting: Internal Medicine

## 2016-06-29 ENCOUNTER — Ambulatory Visit (INDEPENDENT_AMBULATORY_CARE_PROVIDER_SITE_OTHER): Payer: Medicare Other | Admitting: Internal Medicine

## 2016-06-29 ENCOUNTER — Ambulatory Visit (INDEPENDENT_AMBULATORY_CARE_PROVIDER_SITE_OTHER): Payer: Medicare Other | Admitting: Pharmacist

## 2016-06-29 VITALS — BP 98/69 | HR 109 | Ht 69.0 in | Wt 181.2 lb

## 2016-06-29 DIAGNOSIS — Z7901 Long term (current) use of anticoagulants: Secondary | ICD-10-CM

## 2016-06-29 DIAGNOSIS — I482 Chronic atrial fibrillation, unspecified: Secondary | ICD-10-CM

## 2016-06-29 DIAGNOSIS — I739 Peripheral vascular disease, unspecified: Secondary | ICD-10-CM

## 2016-06-29 DIAGNOSIS — I5043 Acute on chronic combined systolic (congestive) and diastolic (congestive) heart failure: Secondary | ICD-10-CM | POA: Diagnosis not present

## 2016-06-29 DIAGNOSIS — R296 Repeated falls: Secondary | ICD-10-CM | POA: Diagnosis not present

## 2016-06-29 DIAGNOSIS — I350 Nonrheumatic aortic (valve) stenosis: Secondary | ICD-10-CM

## 2016-06-29 LAB — POCT INR: INR: 2.4

## 2016-06-29 NOTE — Progress Notes (Signed)
OFFICE NOTE  Chief Complaint:  No complaints  Primary Care Physician: Willey Blade, MD  HPI:  Greg Booker is an 83 year old gentleman followed by Dr. Rex Kras with history of coronary disease in the past. Cardiac catheterization in 2006 showed an occluded right coronary with collaterals, EF 50%. A stress test in 2010 most recently was negative for ischemia. He has had trouble with DVT and has been off Coumadin for periods of time. Recently he had hip surgery, which he underwent uneventfully and has done well and is back on his Coumadin. Unfortunately, his levels were too high; as of January 3 his INR was 6.9.  It now seems to be more regulated. He is describing no symptoms of bleeding, shortness of breath, palpitations, presyncope, syncopal symptoms, chest pain, or any associated cardiac findings.   Greg Booker returns today and is feeling quite well. Unfortunately a few weeks ago he developed a pneumonia and was treated for this. He is now almost completely recovered. Fortunately, his warfarin levels have remained therapeutic. His gotten back to his activities and is looking forward to going on a cruise for the next week.  I had the pleasure of seeing Greg Booker back today in follow-up. He was last seen in 2015 and underwent a stress test which is negative for ischemia as part of a preoperative workup for hip replacement. He ultimately underwent hip replacement is doing very well. He denies any chest pain or worsening shortness of breath. I reviewed his medicines today noted that he is in chronic/permanent atrial fibrillation. His INR is a been stable and followed by Erasmo Downer, our anticoagulation pharmacist. He has been on long-term digoxin which was started by his previous cardiologist. He does have stage III chronic kidney disease. Recently there is data from a couple of very good studies which indicate that digoxin may increase the risk of mortality and that there was a clear cut off of  digoxin levels greater than 1.2 which wasn't associated with increased mortality, especially in patients with known coronary artery disease. He has not had a digoxin level tested in some time - it would be recommended that he has this testing done at least every 6 months.  06/12/2015  Greg Booker was seen back today in follow-up. Recently he was seen by Ignacia Bayley, NP, for imbalance, dizziness and difficulty walking. He denied any true syncopal episodes. [name] ordered an echocardiogram and monitor. The monitor showed rate controlled A. fib without any significant pauses or tachyarrhythmias. The echocardiogram showed moderate aortic stenosis with normal LV function. Neither study indicated a clear etiology of his imbalance and dizziness. Concern is for his fall risk as he is on warfarin anticoagulation. Greg Booker does report he has a history of low back pain and neurosurgery.  01/21/2015  Greg Booker was seen today in follow-up. He recently was admitted at [facility] for acute limb ischemia of the left leg. 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 He ultimately had a peripheral angiogram and underwent left femoral-tibial bypass. He has had some difficulty in wound healing and is getting home health care. During this hospitalization he had no worsening chest pain or unstable angina. He had a recent stress test which showed no ischemia but a fixed inferior defect and was started on Ranexa for some exertional chest pain. This seems to be working well for him. His A. fib was somewhat uncontrolled during his hospitalization and the rate is somewhere around 100 today. He is reestablishing on  warfarin. He is not on an antiplatelet, but should be. Is also not had a recent lipid profile. In the past she's been intolerant to pravastatin, lovastatin and niacin causing myalgias and pruritus, respectively.  04/01/2016  Greg Booker returns today for follow-up. He is recently had some worsening shortness of  breath and lower extremity swelling. He had lower extremity arterial Dopplers this morning and I receive those preliminary reports indicating that his left arterial system is occluded. There is a significant reduction ABI of 0.57 on the right. He seems to have significant swelling on the right as well. He reports 2-3 pillow orthopnea which is new and shortness of breath with exertion. Weight is up to 194 from 189 pounds. EKG shows A. fib at 93 today with nonspecific ST and T-wave changes.   04/07/2016  Greg Booker returns today for follow-up. Since I last saw him last week he has had improvement in his breathing. He's not needed as many pillows to sleep at night. Weight has decreased from 194-191 pounds with his dry weight somewhere around 189 pounds. He is in A. fib with RVR today and was at a much faster rate yesterday when he had his echo performed. His echocardiogram did show a newly reduced EF to 45% with some diffuse hypokinesis. I suspect this is related to tachycardia related to A. fib. BNP was elevated over 700 with an elevated creatinine to 1.85. He is scheduled to see his nephrologist tomorrow. Hopefully that will repeat his creatinine at that time.  06/29/2016  Greg Booker is seen today in follow-up. Over the past several months she's done well. His weight is asked to down further to 181 from 191 pounds. He denies any worsening shortness of breath or swelling. Heart rate was slightly fast today with A. fib at 109. Blood pressure the right arm was 98/69 and checked in the left arm was 110/70. He does have some PAD and underwent left femoral-tibial bypass. He continues to report some discomfort in the left leg and is followed by Dr. Maryjean Morn in [facility]. Previously increased his metoprolol for rate control however there is little room to increase that further today. He says he is ambulating fairly well. He was in the ER in April the fall and had a compression fracture of the lumbar spine. He is apparently  seeing neurosurgery for this but has not undergone any procedures. His aortic murmur sounds stable compared to prior evaluation.  PMHx:  Past Medical History:  Diagnosis Date  . Anticoagulated on Coumadin   . Arthritis   . Calcified granuloma of lung ([place]) RIGHT LUNG BASE - STABLE PER CXR 03-03-2010  . Chronic back pain   . CKD (chronic kidney disease), stage III   . Coronary artery disease    a. 1997 PCI/BMS to the RCA; b. 2006 Cath: Occluded RCA with collat flow, EF 60%, otw nonobs dzs; b. 2010 MV: No ischemia; c. 06/2013 MV: low risk w/o ischemia.  . Diabetes mellitus without complication ([place])   . Falls   . GERD (gastroesophageal reflux disease)   . Heart murmur   . History of DVT (deep vein thrombosis)    a. Early 2000's.  Marland Kitchen History of pneumonia 01/2013  . Hyperlipidemia   . Hypertensive heart disease   . Impaired hearing BILATERAL AIDS  . Low back pain radiating to left leg   . Mitral regurgitation   . Nocturia   . Numbness and tingling of left leg   . PAF (paroxysmal atrial fibrillation) (  [place])    a. CHA2DS2VASc = 5-->chronic coumadin.  . Prostate cancer ([place]) RECUR   a. 2006 S/P radioactive seed implant.  . Sciatic nerve pain LEFT LEG PAIN--  EPI INJECTIONS  . Urgency of urination     Past Surgical History:  Procedure Laterality Date  . BACK SURGERY    . CARDIAC CATHETERIZATION  1998   IN-STENT RESTENOSIS-- LARGE COLLATERALS  . CARDIAC CATHETERIZATION  2003  &  2006   2006 ---  MITRAL REGURG/ 100% OCCULSION RCA WITH LARGE COLLATERALS/ MILD DISEASE LAD & CIRCUMFLEX  . CARDIOVASCULAR STRESS TEST  06-03-2008   NORMAL STUDY/ LV NORMAL / COMPARED TO 12-26-2000 INFERIOR WALL ISCHEMIA IS NO LONGER PRESENT.  Marland Kitchen CARDIOVERSION  09-15-2005   UNSUCCESSFUL  . CORONARY ANGIOPLASTY WITH STENT PLACEMENT  1997- DR AL LITTLE   X1 STENT RCA  . CRYOABLATION  03/01/2011   Procedure: CRYO ABLATION PROSTATE;  Surgeon: Ailene Rud, MD;  Location[facility];   Service: Urology;  Laterality: N/A;  . EXCISION BENIGN BREAST MASS  08-06-2008   LEFT  . JOINT REPLACEMENT    . LAMINECTOMY  10-28-2008   L3 - 4  . LUMBAR LAMINECTOMY/DECOMPRESSION MICRODISCECTOMY Right 04/05/2013   Procedure: Right Lumbar three-four Laminectomy ;  Surgeon: Charlie Pitter, MD;  Location: [facility];  Service: Neurosurgery;  Laterality: Right;  . RADIOACTIVE SEED IMPLANT  08-03-2004   PROSTATE  . TOTAL HIP ARTHROPLASTY  11/02/2011   Procedure: TOTAL HIP ARTHROPLASTY;  Surgeon: Garald Balding, MD;  Location: [facility];  Service: Orthopedics;  Laterality: Left;  Left Total Hip Replacement  . TOTAL HIP ARTHROPLASTY Right 06/26/2013   Procedure: TOTAL HIP ARTHROPLASTY;  Surgeon: Garald Balding, MD;  Location: [facility];  Service: Orthopedics;  Laterality: Right;  . TRANSTHORACIC ECHOCARDIOGRAM  07-26-2007   LVSF NORMAL/ EF 55%/ LEFT ATRIUM 4.5CM MILD TO MOD. DILATED/ MILD  MITRAL & TRICUSPID REGURG    FAMHx:  Family History  Problem Relation Age of Onset  . Arthritis Mother   . Other Mother 105  . Heart Problems Father   . Heart disease Brother   . Breast cancer Sister   . Cancer Sister   . Heart Problems Brother     SOCHx:   reports that he quit smoking about 44 years ago. His smoking use included Cigarettes and Pipe. He has a 25.00 pack-year smoking history. He has never used smokeless tobacco. He reports that he does not drink alcohol or use drugs.  ALLERGIES:  Allergies  Allergen Reactions  . Bicalutamide Other (See Comments)    Casodex caused increase in breast size  . Niacin And Related Other (See Comments)    Burning and stinging from tablets (otc capsules ok)  . Lovastatin Other (See Comments)    Leg weakness  . Beef Extract Rash  . Codeine Itching and Rash  . Pravastatin Other (See Comments)    Myalgia    ROS: Pertinent items noted in HPI and remainder of comprehensive ROS otherwise negative.  HOME MEDS: Current Outpatient Prescriptions    Medication Sig Dispense Refill  . aspirin EC 81 MG tablet Take 81 mg by mouth daily.     . B Complex Vitamins (VITAMIN B COMPLEX PO) Take 1 tablet by mouth daily.     . bicalutamide (CASODEX) 50 MG tablet Take 50 mg by mouth daily.    . Cholecalciferol (VITAMIN D3) 5000 units CAPS Take 5,000 Units by mouth daily.    Marland Kitchen diltiazem (  CARDIZEM CD) 120 MG 24 hr capsule Take 120 mg by mouth daily.    Marland Kitchen docusate sodium (COLACE) 100 MG capsule Take 1 capsule (100 mg total) by mouth 2 (two) times daily as needed for mild constipation. 30 capsule 0  . furosemide (LASIX) 40 MG tablet Take 1 tablet (40 mg total) by mouth 2 (two) times daily. 60 tablet 5  . gabapentin (NEURONTIN) 100 MG capsule Take 300 mg by mouth at bedtime.     . isosorbide mononitrate (IMDUR) 60 MG 24 hr tablet Take 60 mg by mouth daily.     . metoprolol succinate (TOPROL-XL) 25 MG 24 hr tablet Take 3 tablets (75 mg total) by mouth daily. Take with or immediately following a meal. 270 tablet 3  . Multiple Vitamins-Minerals (MULTIVITAMIN WITH MINERALS) tablet Take 1 tablet by mouth daily.    . nitroGLYCERIN (NITROSTAT) 0.4 MG SL tablet Place 0.4 mg under the tongue every 5 (five) minutes as needed for chest pain.    Marland Kitchen omeprazole (PRILOSEC) 20 MG capsule Take 20 mg by mouth daily.    Marland Kitchen oxyCODONE-acetaminophen (PERCOCET/ROXICET) 5-325 MG tablet Take 1-2 tablets by mouth every 6 (six) hours as needed for severe pain. 20 tablet 0  . ranolazine (RANEXA) 500 MG 12 hr tablet Take 1 tablet (500 mg total) by mouth 2 (two) times daily. 60 tablet 5  . Tamsulosin HCl (FLOMAX) 0.4 MG CAPS Take 0.4 mg by mouth daily after supper.    . vitamin C (ASCORBIC ACID) 500 MG tablet Take 500 mg by mouth daily.    Marland Kitchen warfarin (COUMADIN) 5 MG tablet TAKE ONE-HALF TO ONE TABLETS BY MOUTH ONCE DAILY AS DIRECTED BY COUMADIN CLINIC (Patient taking differently: Take 2.5 mg by mouth in the evening on Sun/Tues/Thurs/Sat and 5 mg on Mon/Wed/Fri) 90 tablet 0   No current  facility-administered medications for this visit.     LABS/IMAGING: No results found for this or any previous visit (from the past 48 hour(s)). No results found.  VITALS: BP 98/69   Pulse (!) 109   Ht 5\' 9"  (1.753 m)   Wt 181 lb 3.2 oz (82.2 kg)   BMI 26.76 kg/m   EXAM: General appearance: alert, appears older than stated age and no distress Neck: no carotid bruit, no JVD and thyroid not enlarged, symmetric, no tenderness/mass/nodules Lungs: clear to auscultation bilaterally Heart: irregularly irregular rhythm, S1, S2 normal and systolic murmur: systolic ejection 3/6, crescendo at 2nd right intercostal space Abdomen: soft, non-tender; bowel sounds normal; no masses,  no organomegaly Extremities: extremities normal, atraumatic, no cyanosis or edema and LLE notable for bypass, 1+ pulses Pulses: 2+ and symmetric Skin: Skin color, texture, turgor normal. No rashes or lesions Neurologic: Grossly normal Psych: Pleasant  EKG: Atrial fibrillation with RVR at 109  ASSESSMENT: 1. Chronic systolic congestive heart failure - LVEF decreased to 45% (global HK) 03/2016 2. PAD with recent left critical limb ischemia-status post 3 digit amputation and left femoral-tibial bypass (Dr. Maryjean Morn at [facility]) 3. Dizziness, imbalance, frequent falls 4. Permanent atrial fibrillation - CHADSVASC score of 5 (anticoagulated on warfarin) 5. Coronary artery disease with occluded right coronary left to right collaterals, EF 50% 6. Moderate aortic stenosis. 7. Recent pneumonia 8. Hypertension 9. Dyslipidemia 10. Recent fall with vertebral compression fracture  PLAN: 1.   Greg Booker has been stable from a heart failure standpoint. His A. fib rate is slightly fast again today but generally runs between 9900. He reports his blood pressures usually around 176-160 systolic.  Slightly higher than the left arm than the right which is confirmed today. He may have some subclavian stenosis. He's asymptomatic with this. His  A. fib is permanent and he is anticoagulated on warfarin. We'll have to monitor him for any recurrent falls especially if there are associated head injuries. He is overdue for check of his warfarin. He is requesting that today which we'll arrange with her pharmacist. His most recent echo was in March 2018 and we will need to repeat that next year.  Follow-up in 6 months.  Pixie Casino, MD, [facility] Attending Cardiologist Kotlik C Hilty 06/29/2016, 8:55 AM

## 2016-06-29 NOTE — Patient Instructions (Addendum)
Your physician wants you to follow-up in: 6 months with Dr. Hilty. You will receive a reminder letter in the mail two months in advance. If you don't receive a letter, please call our office to schedule the follow-up appointment.    

## 2016-07-13 ENCOUNTER — Ambulatory Visit (INDEPENDENT_AMBULATORY_CARE_PROVIDER_SITE_OTHER): Payer: Medicare Other | Admitting: Pharmacist

## 2016-07-13 DIAGNOSIS — Z7901 Long term (current) use of anticoagulants: Secondary | ICD-10-CM | POA: Diagnosis not present

## 2016-07-13 DIAGNOSIS — R0989 Other specified symptoms and signs involving the circulatory and respiratory systems: Secondary | ICD-10-CM | POA: Insufficient documentation

## 2016-07-13 DIAGNOSIS — I482 Chronic atrial fibrillation, unspecified: Secondary | ICD-10-CM

## 2016-07-13 LAB — POCT INR: INR: 3.1

## 2016-07-19 DIAGNOSIS — I2510 Atherosclerotic heart disease of native coronary artery without angina pectoris: Secondary | ICD-10-CM | POA: Insufficient documentation

## 2016-07-20 ENCOUNTER — Telehealth: Payer: Self-pay | Admitting: Internal Medicine

## 2016-07-20 NOTE — Telephone Encounter (Signed)
New message    Colletta Maryland, a nurse from [facility] case manager is calling for information on the pt. She needs his latest echo report. Her [phone]

## 2016-07-28 ENCOUNTER — Telehealth: Payer: Self-pay | Admitting: Internal Medicine

## 2016-07-28 NOTE — Telephone Encounter (Signed)
Newell RN Case Manager to provide requested info of last BP, weight, height, EF on confidential VM - per request via received fax

## 2016-08-03 ENCOUNTER — Ambulatory Visit (INDEPENDENT_AMBULATORY_CARE_PROVIDER_SITE_OTHER): Payer: Medicare Other | Admitting: Pharmacist Clinician (PhC)/ Clinical Pharmacy Specialist

## 2016-08-03 DIAGNOSIS — I482 Chronic atrial fibrillation, unspecified: Secondary | ICD-10-CM

## 2016-08-03 DIAGNOSIS — Z7901 Long term (current) use of anticoagulants: Secondary | ICD-10-CM | POA: Diagnosis not present

## 2016-08-03 LAB — POCT INR: INR: 2.1

## 2016-08-05 ENCOUNTER — Telehealth: Payer: Self-pay | Admitting: Internal Medicine

## 2016-08-05 NOTE — Telephone Encounter (Signed)
Greg Booker is calling because in the last week he has gained 6lbs. He has swelling in his (l) leg . Has a little shortness of breath . Please call

## 2016-08-05 NOTE — Telephone Encounter (Signed)
Returned call to patient. He has gained 6lbs over about 7days. He is having swelling in L leg (this is the leg he had a "new vein put in"), right leg swelling a little. He states his stomach is poking out "like he is about 8 months pregnant". He has a little shortness of breath - a little worse than normal. He states he doesn't sleep good at night, he wakes up a lot. No PND/orthopnea.   Verified with that patient takes lasix 40mg  BID BP 108/63  Advised patient to take extra lasix with PM dose today and I would call him tomorrow with further advice per MD

## 2016-08-06 NOTE — Telephone Encounter (Signed)
He will need to be seen next week - should have labs - CMET and BNP prior if possible. Creatinine had been rising.   Dr. Lemmie Evens

## 2016-08-06 NOTE — Telephone Encounter (Signed)
LMTCB Scheduled patient for first available on 7/30 @ 10am w/B. Strader, PA and left this info on VM for patient

## 2016-08-06 NOTE — Telephone Encounter (Signed)
Called patient. He reports he lost 3lbs since yesterday. He is doing a little better. He can come in on Monday 7/30 for APP appointment.

## 2016-08-08 NOTE — Progress Notes (Signed)
Cardiology Office Note    Date:  8/36/6294   ID:  Greg Booker, Greg Booker [birth year], MRN [id number]  PCP:  Willey Blade, MD  Cardiologist: Dr. Debara Pickett   Chief Complaint  Patient presents with  . Follow-up    Recent weight gain and edema.     History of Present Illness:    Greg Booker is a 83 y.o. male with past medical history of CAD (s/p BMS to RCA in 1997, occluded RCA by cath in 2006 with collateral flow noted, low-risk NST in 2015 and 75/1700), chronic systolic CHF (EF 17% by echo in 03/2016), PAD (s/p left fem-pop bypass in 2017), PAF (on Coumadin), history of DVT, AS, HTN, HLD, and Stage 4 CKD who presents to the office for follow-up.   He was last examined by Dr. Debara Pickett in 06/2016 and reported weights had continued to trend down from 191 lbs to 181 lbs. HR was elevated at 109 during the visit but he was asymptomatic with this. BP was borderline with SBP in the 90's to low-100's, therefore his Lopressor doing was not further titrated.  He called the office on 08/05/2016 and reported a wight gain of 6 lbs in the past week with associated edema and abdominal distenstion. He reported good compliance with Lasix 40mg  BID and was encouraged to take an extra tablet that evening. Called the following morning to report weight had decreased by 3 lbs.   In talking with the patient today, he reports improvement in his lower extremity edema after taking an extra dose of Lasix last week and then resuming his routine dosing. Reports his baseline weight is usually between 185 - 186 lbs on his home scales, peaking at 193 last week but down to 187 lbs today. I am concerned about the accuracy of his weights as he reports weighing in his clothing along with shoes. No recent orthopnea, PND, chest pain, or palpitations.   The patient and his wife eat out for most meals and he reports going to [facility] 1-2 times per week and consuming either fried chicken or a chicken pot pie. Says he does not add extra salt  to his entrees. He also consumes breakfast at a [employer] which usually consists of eggs and oatmeal but he frequently consumes hashbrowns as well.    Past Medical History:  Diagnosis Date  . Anticoagulated on Coumadin   . Arthritis   . Calcified granuloma of lung ([place]) RIGHT LUNG BASE - STABLE PER CXR 03-03-2010  . Chronic back pain   . CKD (chronic kidney disease), stage III   . Coronary artery disease    a. 1997 PCI/BMS to the RCA; b. 2006 Cath: Occluded RCA with collat flow, EF 60%, otw nonobs dzs; b. 2010 MV: No ischemia; c. 06/2013 MV: low risk w/o ischemia.  . Diabetes mellitus without complication ([place])   . Falls   . GERD (gastroesophageal reflux disease)   . Heart murmur   . History of DVT (deep vein thrombosis)    a. Early 2000's.  Marland Kitchen History of pneumonia 01/2013  . Hyperlipidemia   . Hypertensive heart disease   . Impaired hearing BILATERAL AIDS  . Low back pain radiating to left leg   . Mitral regurgitation   . Nocturia   . Numbness and tingling of left leg   . PAF (paroxysmal atrial fibrillation) (HCC)    a. CHA2DS2VASc = 5-->chronic coumadin.  . Prostate cancer ([place]) RECUR   a. 2006 S/P radioactive seed implant.  Marland Kitchen  Sciatic nerve pain LEFT LEG PAIN--  EPI INJECTIONS  . Urgency of urination     Past Surgical History:  Procedure Laterality Date  . BACK SURGERY    . CARDIAC CATHETERIZATION  1998   IN-STENT RESTENOSIS-- LARGE COLLATERALS  . CARDIAC CATHETERIZATION  2003  &  2006   2006 ---  MITRAL REGURG/ 100% OCCULSION RCA WITH LARGE COLLATERALS/ MILD DISEASE LAD & CIRCUMFLEX  . CARDIOVASCULAR STRESS TEST  06-03-2008   NORMAL STUDY/ LV NORMAL / COMPARED TO 12-26-2000 INFERIOR WALL ISCHEMIA IS NO LONGER PRESENT.  Marland Kitchen CARDIOVERSION  09-15-2005   UNSUCCESSFUL  . CORONARY ANGIOPLASTY WITH STENT PLACEMENT  1997- DR AL LITTLE   X1 STENT RCA  . CRYOABLATION  03/01/2011   Procedure: CRYO ABLATION PROSTATE;  Surgeon: Ailene Rud, MD;  [facility];  Service: Urology;  Laterality: N/A;  . EXCISION BENIGN BREAST MASS  08-06-2008   LEFT  . JOINT REPLACEMENT    . LAMINECTOMY  10-28-2008   L3 - 4  . LUMBAR LAMINECTOMY/DECOMPRESSION MICRODISCECTOMY Right 04/05/2013   Procedure: Right Lumbar three-four Laminectomy ;  Surgeon: Charlie Pitter, MD;  Location: [place] [facility];  Service: Neurosurgery;  Laterality: Right;  . RADIOACTIVE SEED IMPLANT  08-03-2004   PROSTATE  . TOTAL HIP ARTHROPLASTY  11/02/2011   Procedure: TOTAL HIP ARTHROPLASTY;  Surgeon: Garald Balding, MD;  Location: [place];  Service: Orthopedics;  Laterality: Left;  Left Total Hip Replacement  . TOTAL HIP ARTHROPLASTY Right 06/26/2013   Procedure: TOTAL HIP ARTHROPLASTY;  Surgeon: Garald Balding, MD;  Location: [place];  Service: Orthopedics;  Laterality: Right;  . TRANSTHORACIC ECHOCARDIOGRAM  07-26-2007   LVSF NORMAL/ EF 55%/ LEFT ATRIUM 4.5CM MILD TO MOD. DILATED/ MILD  MITRAL & TRICUSPID REGURG    Current Medications: Outpatient Medications Prior to Visit  Medication Sig Dispense Refill  . aspirin EC 81 MG tablet Take 81 mg by mouth daily.     . B Complex Vitamins (VITAMIN B COMPLEX PO) Take 1 tablet by mouth daily.     . bicalutamide (CASODEX) 50 MG tablet Take 50 mg by mouth daily.    . Cholecalciferol (VITAMIN D3) 5000 units CAPS Take 5,000 Units by mouth daily.    Marland Kitchen diltiazem (CARDIZEM CD) 120 MG 24 hr capsule Take 120 mg by mouth daily.    Marland Kitchen docusate sodium (COLACE) 100 MG capsule Take 1 capsule (100 mg total) by mouth 2 (two) times daily as needed for mild constipation. 30 capsule 0  . Evolocumab with Infusor ([place]) 420 MG/3.5ML SOCT Inject 420 mg into the skin every 30 (thirty) days.    . furosemide (LASIX) 40 MG tablet Take 1 tablet (40 mg total) by mouth 2 (two) times daily. 60 tablet 5  . gabapentin (NEURONTIN) 100 MG capsule Take 300 mg by mouth at bedtime.     . isosorbide mononitrate (IMDUR) 60 MG 24 hr tablet Take 60 mg by  mouth daily.     . metoprolol succinate (TOPROL-XL) 25 MG 24 hr tablet Take 3 tablets (75 mg total) by mouth daily. Take with or immediately following a meal. 270 tablet 3  . Multiple Vitamins-Minerals (MULTIVITAMIN WITH MINERALS) tablet Take 1 tablet by mouth daily.    . nitroGLYCERIN (NITROSTAT) 0.4 MG SL tablet Place 0.4 mg under the tongue every 5 (five) minutes as needed for chest pain.    Marland Kitchen omeprazole (PRILOSEC) 20 MG capsule Take 20 mg by mouth daily.    Marland Kitchen  oxyCODONE-acetaminophen (PERCOCET/ROXICET) 5-325 MG tablet Take 1-2 tablets by mouth every 6 (six) hours as needed for severe pain. 20 tablet 0  . ranolazine (RANEXA) 500 MG 12 hr tablet Take 1 tablet (500 mg total) by mouth 2 (two) times daily. 60 tablet 5  . Tamsulosin HCl (FLOMAX) 0.4 MG CAPS Take 0.4 mg by mouth daily after supper.    . vitamin C (ASCORBIC ACID) 500 MG tablet Take 500 mg by mouth daily.    Marland Kitchen warfarin (COUMADIN) 5 MG tablet TAKE ONE-HALF TO ONE TABLETS BY MOUTH ONCE DAILY AS DIRECTED BY COUMADIN CLINIC (Patient taking differently: Take 2.5 mg by mouth in the evening on Sun/Tues/Thurs/Sat and 5 mg on Mon/Wed/Fri) 90 tablet 0   No facility-administered medications prior to visit.      Allergies:   Bicalutamide; Codeine; Niacin and related; Lovastatin; Beef extract; and Pravastatin   Social History   Social History  . Marital status: Widowed    Spouse name: N/A  . Number of children: N/A  . Years of education: N/A   Social History Main Topics  . Smoking status: Former Smoker    Packs/day: 1.00    Years: 25.00    Types: Cigarettes, Pipe    Quit date: 02/23/1972  . Smokeless tobacco: Never Used  . Alcohol use No  . Drug use: No  . Sexual activity: Not Asked   Other Topics Concern  . None   Social History Narrative   Lives with male friend in a one story home with basement.  Has no children.     Retired from Architect, Hospital doctor, Therapist, music.     Education: some  college.      Family History:  The patient's family history includes Arthritis in his mother; Breast cancer in his sister; Cancer in his sister; Heart Problems in his brother and father; Heart disease in his brother; Other (age of onset: 40) in his mother.   Review of Systems:   Please see the history of present illness.     General:  No chills, fever, night sweats or weight changes.  Cardiovascular:  No chest pain, dyspnea on exertion, orthopnea, palpitations, paroxysmal nocturnal dyspnea. Positive for lower extremity edema.  Dermatological: No rash, lesions/masses Respiratory: No cough, dyspnea Urologic: No hematuria, dysuria Abdominal:   No nausea, vomiting, diarrhea, bright red blood per rectum, melena, or hematemesis Neurologic:  No visual changes, wkns, changes in mental status.  All other systems reviewed and are otherwise negative except as noted above.   Physical Exam:    VS:  BP 108/78   Pulse 71   Ht 5\' 9"  (1.753 m)   Wt 186 lb (84.4 kg)   BMI 27.47 kg/m    General: Well developed, well nourished Caucasian male appearing in no acute distress. Head: Normocephalic, atraumatic, sclera non-icteric, no xanthomas, nares are without discharge.  Neck: No carotid bruits. JVD at 8cm.  Lungs: Respirations regular and unlabored, without wheezes or rales.  Heart: Irregularly irregular. No S3 or S4.  No rubs or gallops appreciated. 2/6 SEM along RUSB.  Abdomen: Soft, non-tender, non-distended with normoactive bowel sounds. No hepatomegaly. No rebound/guarding. No obvious abdominal masses. Msk:  Strength and tone appear normal for age. No joint deformities or effusions. Extremities: No clubbing or cyanosis. Trace lower extremity edema.  Distal pedal pulses are 2+ bilaterally. Neuro: Alert and oriented X 3. Moves all extremities spontaneously. No focal deficits noted. Psych:  Responds to questions appropriately with a normal affect. Skin:  No rashes or lesions noted  Wt Readings  from Last 3 Encounters:  08/09/16 186 lb (84.4 kg)  06/29/16 181 lb 3.2 oz (82.2 kg)  04/07/16 191 lb (86.6 kg)     Studies/Labs Reviewed:   EKG:  EKG is not ordered today.   Recent Labs: 09/24/2015: TSH 2.02 04/01/2016: Brain Natriuretic Peptide 772.5 05/07/2016: BUN 44; Creatinine, Ser 2.18; Hemoglobin 10.4; Platelets 259; Potassium 3.5; Sodium 137   Lipid Panel No results found for: CHOL, TRIG, HDL, CHOLHDL, VLDL, LDLCALC, LDLDIRECT  Additional studies/ records that were reviewed today include:   Echocardiogram: 04/06/2016 Study Conclusions  - Left ventricle: The cavity size was normal. Wall thickness was   increased in a pattern of mild LVH. Indeterminant diastolic   function (atrial fibrillation). The estimated ejection fraction   was 45%. Diffuse hypokinesis. - Aortic valve: Trileaflet; severely calcified leaflets. There was   mild to moderate stenosis. Mean gradient (S): 14 mm Hg. Valve   area (VTI): 1.37 cm^2. - Mitral valve: Mildly calcified annulus. There was mild   regurgitation. - Left atrium: The atrium was mildly dilated. - Right ventricle: The cavity size was normal. Systolic function   was normal. - Right atrium: The atrium was moderately dilated. - Tricuspid valve: Peak RV-RA gradient (S): 38 mm Hg. - Pulmonary arteries: PA peak pressure: 46 mm Hg (S). - Systemic veins: IVC measured 2.0 cm with < 50% respirophasic   variation, suggesting RA pressure 8 mmHg.  Impressions:  - The patient was in rapid atrial fibrillation. Normal LV size with   mild LV hypertrophy. EF 45%, diffuse hypokinesis. Normal RV size   and systolic function. There was mild to moderate aortic   stenosis: moderate visually and by calculated valve area, mild by   mean gradient. Mild mitral regurgitation. Biatrial enlargement.   Mild pulmonary hypertension.  NST: 09/2015  Nuclear stress EF: 53%.  Downsloping ST segment depression ST segment depression of 1 mm was noted during  stress in the aVF, V5, III and II leads.  Defect 1: There is a defect present in the basal inferior, basal inferolateral, mid inferior, mid inferolateral and apical inferior location.  Findings consistent with prior myocardial infarction with peri-infarct ischemia.  This is a low risk study.   Low risk stress nuclear study with inferior wall scar and inferolateral peri-infarct ischemia. Borderline decreased left ventricular global systolic function.  Assessment:    1. Chronic systolic heart failure ([place])   2. Coronary artery disease involving native coronary artery of native heart without angina pectoris   3. PAD (peripheral artery disease) (HCC)   4. Persistent atrial fibrillation ([place])   5. Aortic valve stenosis, etiology of cardiac valve disease unspecified   6. Essential hypertension   7. CKD (chronic kidney disease) stage 4, GFR 15-29 ml/min (HCC)      Plan:   In order of problems listed above:  1. Chronic Systolic CHF - has a known reduced EF of 45% by echo in 03/2016, down from previous echo in 05/2015 as EF was 55-60% at that time. - volume status has been variable in the setting of dietary indiscretions as he eats out for 2 of his 3 meals per day. We spent a significant amount of time today discussing sodium restriction.  - he does not appear overly volume overloaded by physical examination. He does have trace edema but lungs are clear on examination. - continue Lasix 40mg  BID. Can take an additional tablet for weight gain > 3 lbs overnight.  -  continue BB therapy. If EF continues to worsen, will need to discontinue Cardizem and further titrate his BB therapy.  2. CAD - s/p BMS to RCA in 1997, occluded RCA by cath in 2006 with collateral flow noted. NST in 09/2015 showed prior infarct with no significant ischemia.  - he denies any recent chest pain or changes in his baseline dyspnea on exertion. - continue ASA (continues to be on this along with Coumadin), BB, Imdur, and  Ranexa.  3. PAD - s/p left fem-pop bypass in 2017. - followed by [facility]. Charlane Ferretti at [facility].  4. PAF  - he denies any recent palpitations. Continue Toprol-XL 75mg  daily and Cardizem CD 120mg  daily (consider stopping CCB in the future pending EF).  - he denies any evidence of active bleeding. Continue Coumadin for anticoagulation.  5. Aortic Stenosis - mild to moderate by echo in 03/2016 with a mean gradient of 14 mm Hg.   6. HTN - BP is well-controlled at 108/78 during today's visit. - continue current medication regimen.  7. Stage 4 CKD - followed by Nephrology (Dr. Neta Ehlers) at [facility].  - reports creatinine was checked last week and stable at that time. At 2.18 in 04/2016.   Medication Adjustments/Labs and Tests Ordered: Current medicines are reviewed at length with the patient today.  Concerns regarding medicines are outlined above.  Medication changes, Labs and Tests ordered today are listed in the Patient Instructions below. Patient Instructions  Medication Instructions: You may take an additional Furosemide 40 mg tablet for a weight gain over 3 pounds over night.  Follow-Up: Please keep your follow up with Dr. Debara Pickett.  If you need a refill on your cardiac medications before your next appointment, please call your pharmacy.    Signed, Erma Heritage, PA-C  08/09/2016 2:59 PM    [place], [place], [place]  [address] Phone: [phone]; Fax: [phone]  [address] [place], [place] [address] Phone: [phone]

## 2016-08-09 ENCOUNTER — Encounter: Payer: Self-pay | Admitting: Student

## 2016-08-09 ENCOUNTER — Ambulatory Visit (INDEPENDENT_AMBULATORY_CARE_PROVIDER_SITE_OTHER): Payer: Medicare Other | Admitting: Student

## 2016-08-09 VITALS — BP 108/78 | HR 71 | Ht 69.0 in | Wt 186.0 lb

## 2016-08-09 DIAGNOSIS — I1310 Hypertensive heart and chronic kidney disease without heart failure, with stage 1 through stage 4 chronic kidney disease, or unspecified chronic kidney disease: Secondary | ICD-10-CM | POA: Insufficient documentation

## 2016-08-09 DIAGNOSIS — I5022 Chronic systolic (congestive) heart failure: Secondary | ICD-10-CM | POA: Diagnosis not present

## 2016-08-09 DIAGNOSIS — E663 Overweight: Secondary | ICD-10-CM | POA: Insufficient documentation

## 2016-08-09 DIAGNOSIS — I10 Essential (primary) hypertension: Secondary | ICD-10-CM

## 2016-08-09 DIAGNOSIS — I2510 Atherosclerotic heart disease of native coronary artery without angina pectoris: Secondary | ICD-10-CM

## 2016-08-09 DIAGNOSIS — Z7901 Long term (current) use of anticoagulants: Secondary | ICD-10-CM | POA: Insufficient documentation

## 2016-08-09 DIAGNOSIS — I481 Persistent atrial fibrillation: Secondary | ICD-10-CM

## 2016-08-09 DIAGNOSIS — E1140 Type 2 diabetes mellitus with diabetic neuropathy, unspecified: Secondary | ICD-10-CM | POA: Insufficient documentation

## 2016-08-09 DIAGNOSIS — E559 Vitamin D deficiency, unspecified: Secondary | ICD-10-CM | POA: Insufficient documentation

## 2016-08-09 DIAGNOSIS — I739 Peripheral vascular disease, unspecified: Secondary | ICD-10-CM | POA: Diagnosis not present

## 2016-08-09 DIAGNOSIS — N184 Chronic kidney disease, stage 4 (severe): Secondary | ICD-10-CM

## 2016-08-09 DIAGNOSIS — I350 Nonrheumatic aortic (valve) stenosis: Secondary | ICD-10-CM

## 2016-08-09 DIAGNOSIS — M1990 Unspecified osteoarthritis, unspecified site: Secondary | ICD-10-CM | POA: Insufficient documentation

## 2016-08-09 DIAGNOSIS — I4819 Other persistent atrial fibrillation: Secondary | ICD-10-CM

## 2016-08-09 NOTE — Patient Instructions (Signed)
Medication Instructions: You may take an additional Furosemide 40 mg tablet for a weight gain over 3 pounds over night.   Follow-Up: Please keep your follow up with Dr. Debara Pickett.   If you need a refill on your cardiac medications before your next appointment, please call your pharmacy.

## 2016-08-17 ENCOUNTER — Ambulatory Visit (INDEPENDENT_AMBULATORY_CARE_PROVIDER_SITE_OTHER): Payer: Medicare Other | Admitting: Pharmacist

## 2016-08-17 DIAGNOSIS — I4891 Unspecified atrial fibrillation: Secondary | ICD-10-CM

## 2016-08-17 DIAGNOSIS — Z7901 Long term (current) use of anticoagulants: Secondary | ICD-10-CM | POA: Diagnosis not present

## 2016-08-17 DIAGNOSIS — I482 Chronic atrial fibrillation, unspecified: Secondary | ICD-10-CM

## 2016-08-17 LAB — POCT INR: INR: 3.8

## 2016-09-02 ENCOUNTER — Telehealth: Payer: Self-pay | Admitting: Internal Medicine

## 2016-09-02 NOTE — Telephone Encounter (Signed)
Patient has been called and made aware of the Dr. Lysbeth Penner recommendation.  I would have him take extra lasix as we have suggested. He can take 40 TID for 3 days and monitor weight -if it comes back down, then decrease to 40 mg BID. Would not need additional labs for a short period.  He verbalized his understanding and stated that he will call back with a weight update.

## 2016-09-02 NOTE — Telephone Encounter (Signed)
New Message  Pt call requesting to speak with RN about recent weight gain. Pt states this week he has gain 7lb. Please call back to discuss

## 2016-09-02 NOTE — Telephone Encounter (Signed)
Returned the call to the patient. He stated that since Monday his weight has gone from 181.5 to 188.9 pounds. He is asymptomatic except for his normal dyspnea on exertion.   He is currently taking furosemide 40 mg bid. Per his visit with the PA in July he may take an extra furosemide for a weight gain over 3 pounds. The last labwork he has had done was in April with a BUN of 44 and Creatinine of 2.18. He does run high due to his CKD.   Will route to the provider for further recommendation on whether he should take an extra lasix and come in for lab work.

## 2016-09-02 NOTE — Telephone Encounter (Signed)
I would have him take extra lasix as we have suggested. He can take 40 TID for 3 days and monitor weight -if it comes back down, then decrease to 40 mg BID. Would not need additional labs for a short period.  Dr. Lemmie Evens

## 2016-09-07 ENCOUNTER — Ambulatory Visit (INDEPENDENT_AMBULATORY_CARE_PROVIDER_SITE_OTHER): Payer: Medicare Other | Admitting: Pharmacist Clinician (PhC)/ Clinical Pharmacy Specialist

## 2016-09-07 DIAGNOSIS — Z7901 Long term (current) use of anticoagulants: Secondary | ICD-10-CM | POA: Diagnosis not present

## 2016-09-07 DIAGNOSIS — I482 Chronic atrial fibrillation, unspecified: Secondary | ICD-10-CM

## 2016-09-07 DIAGNOSIS — I4891 Unspecified atrial fibrillation: Secondary | ICD-10-CM | POA: Diagnosis not present

## 2016-09-07 LAB — POCT INR: INR: 5.4

## 2016-09-17 ENCOUNTER — Ambulatory Visit (INDEPENDENT_AMBULATORY_CARE_PROVIDER_SITE_OTHER): Payer: Medicare Other | Admitting: Pharmacist

## 2016-09-17 DIAGNOSIS — I482 Chronic atrial fibrillation, unspecified: Secondary | ICD-10-CM

## 2016-09-17 DIAGNOSIS — I4891 Unspecified atrial fibrillation: Secondary | ICD-10-CM

## 2016-09-17 DIAGNOSIS — I82409 Acute embolism and thrombosis of unspecified deep veins of unspecified lower extremity: Secondary | ICD-10-CM

## 2016-09-17 DIAGNOSIS — Z7901 Long term (current) use of anticoagulants: Secondary | ICD-10-CM

## 2016-09-17 DIAGNOSIS — Z9889 Other specified postprocedural states: Secondary | ICD-10-CM

## 2016-09-17 LAB — POCT INR: INR: 4.2

## 2016-10-01 ENCOUNTER — Ambulatory Visit (INDEPENDENT_AMBULATORY_CARE_PROVIDER_SITE_OTHER): Payer: Medicare Other | Admitting: Pharmacist

## 2016-10-01 DIAGNOSIS — Z7901 Long term (current) use of anticoagulants: Secondary | ICD-10-CM

## 2016-10-01 DIAGNOSIS — I482 Chronic atrial fibrillation, unspecified: Secondary | ICD-10-CM

## 2016-10-01 DIAGNOSIS — Z9889 Other specified postprocedural states: Secondary | ICD-10-CM

## 2016-10-01 DIAGNOSIS — I82409 Acute embolism and thrombosis of unspecified deep veins of unspecified lower extremity: Secondary | ICD-10-CM

## 2016-10-01 DIAGNOSIS — I4891 Unspecified atrial fibrillation: Secondary | ICD-10-CM

## 2016-10-01 LAB — POCT INR: INR: 6.4

## 2016-10-15 ENCOUNTER — Ambulatory Visit (INDEPENDENT_AMBULATORY_CARE_PROVIDER_SITE_OTHER): Payer: Medicare Other | Admitting: Pharmacist

## 2016-10-15 DIAGNOSIS — I482 Chronic atrial fibrillation, unspecified: Secondary | ICD-10-CM

## 2016-10-15 DIAGNOSIS — I82409 Acute embolism and thrombosis of unspecified deep veins of unspecified lower extremity: Secondary | ICD-10-CM

## 2016-10-15 DIAGNOSIS — I4891 Unspecified atrial fibrillation: Secondary | ICD-10-CM

## 2016-10-15 DIAGNOSIS — Z7901 Long term (current) use of anticoagulants: Secondary | ICD-10-CM

## 2016-10-15 DIAGNOSIS — Z9889 Other specified postprocedural states: Secondary | ICD-10-CM

## 2016-10-15 LAB — POCT INR: INR: 3.7

## 2016-10-16 ENCOUNTER — Other Ambulatory Visit: Payer: Self-pay | Admitting: Internal Medicine

## 2016-10-18 ENCOUNTER — Telehealth: Payer: Self-pay | Admitting: Internal Medicine

## 2016-10-18 NOTE — Telephone Encounter (Signed)
Pt wants to be seen,had chest pains on Saturday,had to take Nitroglycerin. Pt says he does not have chest pains at the present,but chest is still sore.

## 2016-10-18 NOTE — Telephone Encounter (Signed)
Returned call to pt he states that he was awakened at 3am on Saturday morning he states that he waited to take his nitro until 4am x1 he states that this made it batter but chest pain did not go away until about 5am. He states that he did not take another because "he does not like taking them". He states that he has not had any further chest pain or pressure he states that his his chest is "only a little sore since". Tried to go over medication he states that he takes all his medications. He would like to be seen to discuss his chest pain, no appts available today or tomorrow  Appt made with Isaac Laud 10-20-16 @11am  to discuss, he will bring medication list and arrive 15 min early

## 2016-10-20 ENCOUNTER — Encounter: Payer: Self-pay | Admitting: Physician Assistant

## 2016-10-20 ENCOUNTER — Ambulatory Visit (INDEPENDENT_AMBULATORY_CARE_PROVIDER_SITE_OTHER): Payer: Medicare Other | Admitting: Physician Assistant

## 2016-10-20 VITALS — BP 130/64 | HR 94 | Ht 69.0 in | Wt 183.6 lb

## 2016-10-20 DIAGNOSIS — I739 Peripheral vascular disease, unspecified: Secondary | ICD-10-CM | POA: Diagnosis not present

## 2016-10-20 DIAGNOSIS — E785 Hyperlipidemia, unspecified: Secondary | ICD-10-CM | POA: Diagnosis not present

## 2016-10-20 DIAGNOSIS — I482 Chronic atrial fibrillation, unspecified: Secondary | ICD-10-CM

## 2016-10-20 DIAGNOSIS — I25110 Atherosclerotic heart disease of native coronary artery with unstable angina pectoris: Secondary | ICD-10-CM

## 2016-10-20 DIAGNOSIS — I5022 Chronic systolic (congestive) heart failure: Secondary | ICD-10-CM | POA: Diagnosis not present

## 2016-10-20 DIAGNOSIS — N184 Chronic kidney disease, stage 4 (severe): Secondary | ICD-10-CM

## 2016-10-20 DIAGNOSIS — R079 Chest pain, unspecified: Secondary | ICD-10-CM

## 2016-10-20 DIAGNOSIS — I10 Essential (primary) hypertension: Secondary | ICD-10-CM

## 2016-10-20 MED ORDER — NITROGLYCERIN 0.4 MG SL SUBL: 0.4000 mg | SUBLINGUAL_TABLET | SUBLINGUAL | 99 refills | Status: AC | PRN

## 2016-10-20 MED ORDER — ISOSORBIDE MONONITRATE ER 60 MG PO TB24: ORAL_TABLET | ORAL | 5 refills | Status: DC

## 2016-10-20 NOTE — Progress Notes (Signed)
Cardiology Office Note    Date:  82/42/3536   ID:  Gen, Clagg [birth year], MRN [id number]  PCP:  Willey Blade, MD  Cardiologist:  Dr. Debara Pickett  Primary nephrologist: Dr. Neta Ehlers at [facility]  Chief Complaint  Patient presents with  . Follow-up    seen for Dr. Debara Pickett    History of Present Illness:  Greg Booker is a 83 y.o. male with PMH of CAD (s/p BMS to RCA in 1997, occluded RCA by cath 2006 w collateral flow noted), chronic systolic heart failure with baseline EF 45%, PAD s/p left femoropopliteal bypass in 2017, chronic atrial fibrillation on Coumadin, history of DVT, hypertension, hyperlipidemia, stage IV CKD. He had a low risk nuclear stress test in 2015 and also in September 2017. He was last seen by Georgiana Spinner on 08/09/2016, 1 status symptoms to be stable at the time. He was instructed to continue on Lasix 40 mg twice a day. He was recently treated at an outside hospital for left SFA occlusion. He underwent open thrombectomy of the left SFA at bypass with angioplasty of the distal anastomosis.  Patient presented today for evaluation of chest pain. He says Saturday morning, he woke up at 3 AM with substernal chest pain that is reminiscent of the previous angina in 1997. He took a nitroglycerin at 4 AM and the chest pain eventually went away 5 AM. His chest has been sore for 2 days afterward. He denies any chest pain prior to last Saturday and the last time his chest was sore was Monday morning. It is unclear to me what is his current renal function, as the last lab work in [place] was in April. He is going to see his nephrologist at this afternoon, he will need a basic metabolic panel to check for renal function. We have also increased his Imdur to 90 mg daily. He will need echocardiogram as outpatient as soon as possible. If EF is low, we would have low threshold to cath him after overnight hydration. EKG today shows he remained in atrial fibrillation however there is more  prominent ST depression in the lateral leads. EKG has been reviewed with patient's primary cardiologist Dr. Debara Pickett. Given baseline severe kidney disease and lack of any chest discomfort for the past 2 days, we decided to hold off on obtaining troponin as it is unlikely to change the clinical outcome. I will see him back in 2-3 weeks for close outpatient visit. He can see Dr. Debara Pickett in 3 month.   Past Medical History:  Diagnosis Date  . Anticoagulated on Coumadin   . Arthritis   . Calcified granuloma of lung ([place]) RIGHT LUNG BASE - STABLE PER CXR 03-03-2010  . Chronic back pain   . CKD (chronic kidney disease), stage III ([place])   . Coronary artery disease    a. 1997 PCI/BMS to the RCA; b. 2006 Cath: Occluded RCA with collat flow, EF 60%, otw nonobs dzs; b. 2010 MV: No ischemia; c. 06/2013 MV: low risk w/o ischemia.  . Diabetes mellitus without complication ([place])   . Falls   . GERD (gastroesophageal reflux disease)   . Heart murmur   . History of DVT (deep vein thrombosis)    a. Early 2000's.  Marland Kitchen History of pneumonia 01/2013  . Hyperlipidemia   . Hypertensive heart disease   . Impaired hearing BILATERAL AIDS  . Low back pain radiating to left leg   . Mitral regurgitation   . Nocturia   .  Numbness and tingling of left leg   . PAF (paroxysmal atrial fibrillation) (HCC)    a. CHA2DS2VASc = 5-->chronic coumadin.  . Prostate cancer ([place]) RECUR   a. 2006 S/P radioactive seed implant.  . Sciatic nerve pain LEFT LEG PAIN--  EPI INJECTIONS  . Urgency of urination     Past Surgical History:  Procedure Laterality Date  . BACK SURGERY    . CARDIAC CATHETERIZATION  1998   IN-STENT RESTENOSIS-- LARGE COLLATERALS  . CARDIAC CATHETERIZATION  2003  &  2006   2006 ---  MITRAL REGURG/ 100% OCCULSION RCA WITH LARGE COLLATERALS/ MILD DISEASE LAD & CIRCUMFLEX  . CARDIOVASCULAR STRESS TEST  06-03-2008   NORMAL STUDY/ LV NORMAL / COMPARED TO 12-26-2000 INFERIOR WALL ISCHEMIA IS NO LONGER PRESENT.  Marland Kitchen  CARDIOVERSION  09-15-2005   UNSUCCESSFUL  . CORONARY ANGIOPLASTY WITH STENT PLACEMENT  1997- DR AL LITTLE   X1 STENT RCA  . CRYOABLATION  03/01/2011   Procedure: CRYO ABLATION PROSTATE;  Surgeon: Ailene Rud, MD;  Location: [facility];  Service: Urology;  Laterality: N/A;  . EXCISION BENIGN BREAST MASS  08-06-2008   LEFT  . JOINT REPLACEMENT    . LAMINECTOMY  10-28-2008   L3 - 4  . LUMBAR LAMINECTOMY/DECOMPRESSION MICRODISCECTOMY Right 04/05/2013   Procedure: Right Lumbar three-four Laminectomy ;  Surgeon: Charlie Pitter, MD;  Location: [place] [facility];  Service: Neurosurgery;  Laterality: Right;  . RADIOACTIVE SEED IMPLANT  08-03-2004   PROSTATE  . TOTAL HIP ARTHROPLASTY  11/02/2011   Procedure: TOTAL HIP ARTHROPLASTY;  Surgeon: Garald Balding, MD;  Location: [place];  Service: Orthopedics;  Laterality: Left;  Left Total Hip Replacement  . TOTAL HIP ARTHROPLASTY Right 06/26/2013   Procedure: TOTAL HIP ARTHROPLASTY;  Surgeon: Garald Balding, MD;  Location: [place];  Service: Orthopedics;  Laterality: Right;  . TRANSTHORACIC ECHOCARDIOGRAM  07-26-2007   LVSF NORMAL/ EF 55%/ LEFT ATRIUM 4.5CM MILD TO MOD. DILATED/ MILD  MITRAL & TRICUSPID REGURG    Current Medications: Outpatient Medications Prior to Visit  Medication Sig Dispense Refill  . aspirin EC 81 MG tablet Take 81 mg by mouth daily.     . B Complex Vitamins (VITAMIN B COMPLEX PO) Take 1 tablet by mouth daily.     . bicalutamide (CASODEX) 50 MG tablet Take 50 mg by mouth daily.    . cephALEXin (KEFLEX) 500 MG capsule Take 500 mg by mouth 2 (two) times daily.    . Cholecalciferol (VITAMIN D3) 5000 units CAPS Take 5,000 Units by mouth daily.    Marland Kitchen diltiazem (CARDIZEM CD) 120 MG 24 hr capsule Take 120 mg by mouth daily.    Marland Kitchen docusate sodium (COLACE) 100 MG capsule Take 1 capsule (100 mg total) by mouth 2 (two) times daily as needed for mild constipation. 30 capsule 0  . Evolocumab with Infusor ([place]) 420 MG/3.5ML SOCT Inject 420 mg into the skin every 30 (thirty) days.    . furosemide (LASIX) 40 MG tablet TAKE 1 TABLET BY MOUTH TWO  TIMES DAILY 180 tablet 0  . gabapentin (NEURONTIN) 100 MG capsule Take 300 mg by mouth at bedtime.     . metoprolol succinate (TOPROL-XL) 25 MG 24 hr tablet Take 3 tablets (75 mg total) by mouth daily. Take with or immediately following a meal. 270 tablet 3  . Multiple Vitamins-Minerals (MULTIVITAMIN WITH MINERALS) tablet Take 1 tablet by mouth daily.    Marland Kitchen omeprazole (PRILOSEC) 20 MG  capsule Take 20 mg by mouth daily.    Marland Kitchen oxyCODONE-acetaminophen (PERCOCET/ROXICET) 5-325 MG tablet Take 1-2 tablets by mouth every 6 (six) hours as needed for severe pain. 20 tablet 0  . ranolazine (RANEXA) 500 MG 12 hr tablet Take 1 tablet (500 mg total) by mouth 2 (two) times daily. 60 tablet 5  . Tamsulosin HCl (FLOMAX) 0.4 MG CAPS Take 0.4 mg by mouth daily after supper.    . vitamin C (ASCORBIC ACID) 500 MG tablet Take 500 mg by mouth daily.    Marland Kitchen warfarin (COUMADIN) 5 MG tablet TAKE ONE-HALF TO ONE TABLETS BY MOUTH ONCE DAILY AS DIRECTED BY COUMADIN CLINIC (Patient taking differently: Take 2.5 mg by mouth in the evening on Sun/Tues/Thurs/Sat and 5 mg on Mon/Wed/Fri) 90 tablet 0  . isosorbide mononitrate (IMDUR) 60 MG 24 hr tablet Take 60 mg by mouth daily.     . nitroGLYCERIN (NITROSTAT) 0.4 MG SL tablet Place 0.4 mg under the tongue every 5 (five) minutes as needed for chest pain.     No facility-administered medications prior to visit.      Allergies:   Bicalutamide; Codeine; Niacin and related; Lovastatin; Beef extract; and Pravastatin   Social History   Social History  . Marital status: Widowed    Spouse name: N/A  . Number of children: N/A  . Years of education: N/A   Social History Main Topics  . Smoking status: Former Smoker    Packs/day: 1.00    Years: 25.00    Types: Cigarettes, Pipe    Quit date: 02/23/1972  . Smokeless tobacco: Never Used  .  Alcohol use No  . Drug use: No  . Sexual activity: Not Asked   Other Topics Concern  . None   Social History Narrative   Lives with male friend in a one story home with basement.  Has no children.     Retired from Architect, Hospital doctor, Therapist, music.     Education: some college.      Family History:  The patient's family history includes Arthritis in his mother; Breast cancer in his sister; Cancer in his sister; Heart Problems in his brother and father; Heart disease in his brother; Other (age of onset: 42) in his mother.   ROS:   Please see the history of present illness.    ROS All other systems reviewed and are negative.   PHYSICAL EXAM:   VS:  BP 130/64   Pulse 94   Ht 5\' 9"  (1.753 m)   Wt 183 lb 9.6 oz (83.3 kg)   BMI 27.11 kg/m    GEN: Well nourished, well developed, in no acute distress  HEENT: normal  Neck: no JVD, carotid bruits, or masses Cardiac: Irregularly irregular; no murmurs, rubs, or gallops,no edema  Respiratory:  clear to auscultation bilaterally, normal work of breathing GI: soft, nontender, nondistended, + BS MS: no deformity or atrophy  Skin: warm and dry, no rash Neuro:  Alert and Oriented x 3, Strength and sensation are intact Psych: euthymic mood, full affect  Wt Readings from Last 3 Encounters:  10/20/16 183 lb 9.6 oz (83.3 kg)  08/09/16 186 lb (84.4 kg)  06/29/16 181 lb 3.2 oz (82.2 kg)      Studies/Labs Reviewed:   EKG:  EKG is ordered today.  The ekg ordered today demonstrates Atrial fibrillation with mild ST depression in lateral leads.  Recent Labs: 04/01/2016: Brain Natriuretic Peptide 772.5 05/07/2016: BUN 44; Creatinine, Ser 2.18; Hemoglobin 10.4;  Platelets 259; Potassium 3.5; Sodium 137   Lipid Panel No results found for: CHOL, TRIG, HDL, CHOLHDL, VLDL, LDLCALC, LDLDIRECT  Additional studies/ records that were reviewed today include:    Cath 10/09/2004  CORONARY ARTERIOGRAPHY:  On  fluoroscopy, dense calcification was seen in the  proximal portion of the LAD and in the left main.  1.  Left main normal; it bifurcated.  2.  LAD:  The LAD extended down and crossed the apex of the heart.  It gave      rise to a large left-to-right collateral bed.  The proximal LAD had      minor irregularities of less than 40%.  The ostium of the first diagonal      had minor narrowing.  The remainder showed no significant disease.  3.  Circumflex:  The circumflex gave rise to 1 large OM vessel and the OM      vessel itself was free of disease.  Proximal to the OM vessel was a      focal area of narrowing that was less than 50%.  4.  Right coronary artery:  On fluoroscopy, a stent was noted in what would      be the midportion of the RCA.  The vessel, however, was occluded 100%      proximally.  There were left-to-right collaterals filling the entire      posterior descending artery.   CONCLUSION:  1.  Ejection fraction 50% with posterobasilar hypokinesis.  2.  Mitral regurgitation, +2.  3.  One hundred percent occlusion of the right coronary artery with mild      disease in both the left anterior descending and circumflex, but no high-      grade flow-limiting obstructions.      Echo 04/06/2016 LV EF: 45%  Study Conclusions  - Left ventricle: The cavity size was normal. Wall thickness was   increased in a pattern of mild LVH. Indeterminant diastolic   function (atrial fibrillation). The estimated ejection fraction   was 45%. Diffuse hypokinesis. - Aortic valve: Trileaflet; severely calcified leaflets. There was   mild to moderate stenosis. Mean gradient (S): 14 mm Hg. Valve   area (VTI): 1.37 cm^2. - Mitral valve: Mildly calcified annulus. There was mild   regurgitation. - Left atrium: The atrium was mildly dilated. - Right ventricle: The cavity size was normal. Systolic function   was normal. - Right atrium: The atrium was moderately dilated. - Tricuspid valve: Peak  RV-RA gradient (S): 38 mm Hg. - Pulmonary arteries: PA peak pressure: 46 mm Hg (S). - Systemic veins: IVC measured 2.0 cm with < 50% respirophasic   variation, suggesting RA pressure 8 mmHg.  Impressions:  - The patient was in rapid atrial fibrillation. Normal LV size with   mild LV hypertrophy. EF 45%, diffuse hypokinesis. Normal RV size   and systolic function. There was mild to moderate aortic   stenosis: moderate visually and by calculated valve area, mild by   mean gradient. Mild mitral regurgitation. Biatrial enlargement.   Mild pulmonary hypertension.   ASSESSMENT:    1. Chest pain, unspecified type   2. Coronary artery disease involving native coronary artery of native heart with unstable angina pectoris ([place])   3. Chronic systolic heart failure ([place])   4. PAD (peripheral artery disease) ([place])   5. Chronic atrial fibrillation (HCC)   6. Essential hypertension   7. Hyperlipidemia, unspecified hyperlipidemia type   8. CKD (chronic kidney disease), stage IV ([place])  PLAN:  In order of problems listed above:  1. Chest pain: Symptom very much concerning for unstable angina, at this point, cannot rule out whether or not he had a cardiac event. EKG showed more downward ST segment changes in the lateral leads. I reviewed the EKG and his symptom with Dr. Debara Pickett. He has not had any more chest discomfort for the past 2 days, given his poor renal function, we decided to approach intervention with caution. He will have a stat echocardiogram as outpatient, if EF is low, then we can consider outpatient cardiac catheterization however he will need overnight hydration due to stage IV CKD.  2. CAD: See above, last cardiac catheterization 2006 which showed 100% occluded proximal RCA, otherwise minor irregularities of less than 40% in LAD and less than 50% stenosis in proximal OM.  3. PAD: History of left femorofemoral bypass, recently underwent emergent surgery for occluded SFA that  induced ischemic limp, symptom has improved after surgery.  4. Chronic atrial fibrillation on Coumadin: Rate controlled  5. Hypertension: Blood pressure well controlled  6. Hyperlipidemia: Intolerant to multiple statins, on Repatha  7. CKD stage IV: Surprisingly, I was unable to locate the last basic metabolic panel even after the recent lower extremity angiography. He likely had basic metabolic panel, however it is not in our system. He has office visit with his nephrologist to this afternoon, he will need a basic metabolic panel. This may become important when we decide whether or not to pursue any further invasive workup.    Medication Adjustments/Labs and Tests Ordered: Current medicines are reviewed at length with the patient today.  Concerns regarding medicines are outlined above.  Medication changes, Labs and Tests ordered today are listed in the Patient Instructions below. Patient Instructions  Medication Instructions:  INCREASE YOUR ISOSORBIDE TO 1 AND 1/2 TABLETS DAILY (90 MG TOTAL)   Labwork: BMET TODAY AT [facility] RESULTS TO [phone]  Testing/Procedures: Your physician has requested that you have an echocardiogram. Echocardiography is a painless test that uses sound waves to create images of your heart. It provides your doctor with information about the size and shape of your heart and how well your heart's chambers and valves are working. This procedure takes approximately one hour. There are no restrictions for this procedure. [facility] AT [address] NEED AS SOON AS POSSIBLE   Follow-Up: Your physician recommends that you schedule a follow-up appointment in: 2-3 [place] [facility] [place]  Your physician recommends that you schedule a follow-up appointment in: [place]   If you need a refill on your cardiac medications before your next appointment, please call your pharmacy.     Hilbert Corrigan, [place]  10/20/2016 10:35 PM     [facility] [place], [place], [name]  [address] Phone: [phone]; Fax: [phone]

## 2016-10-20 NOTE — Patient Instructions (Addendum)
Medication Instructions:  INCREASE YOUR ISOSORBIDE TO 1 AND 1/2 TABLETS DAILY (90 MG TOTAL)   Labwork: BMET TODAY AT [facility] RESULTS TO [phone]  Testing/Procedures: Your physician has requested that you have an echocardiogram. Echocardiography is a painless test that uses sound waves to create images of your heart. It provides your doctor with information about the size and shape of your heart and how well your heart's chambers and valves are working. This procedure takes approximately one hour. There are no restrictions for this procedure. [facility] AT [address] NEED AS SOON AS POSSIBLE   Follow-Up: Your physician recommends that you schedule a follow-up appointment in: 2-3 [place] [facility] [place]  Your physician recommends that you schedule a follow-up appointment in: [place]   If you need a refill on your cardiac medications before your next appointment, please call your pharmacy.

## 2016-10-22 ENCOUNTER — Other Ambulatory Visit: Payer: Self-pay

## 2016-10-22 ENCOUNTER — Ambulatory Visit (HOSPITAL_COMMUNITY): Payer: Medicare Other | Attending: Internal Medicine

## 2016-10-22 DIAGNOSIS — Z87891 Personal history of nicotine dependence: Secondary | ICD-10-CM | POA: Insufficient documentation

## 2016-10-22 DIAGNOSIS — I083 Combined rheumatic disorders of mitral, aortic and tricuspid valves: Secondary | ICD-10-CM | POA: Insufficient documentation

## 2016-10-22 DIAGNOSIS — E785 Hyperlipidemia, unspecified: Secondary | ICD-10-CM | POA: Insufficient documentation

## 2016-10-22 DIAGNOSIS — N189 Chronic kidney disease, unspecified: Secondary | ICD-10-CM | POA: Insufficient documentation

## 2016-10-22 DIAGNOSIS — E1122 Type 2 diabetes mellitus with diabetic chronic kidney disease: Secondary | ICD-10-CM | POA: Diagnosis not present

## 2016-10-22 DIAGNOSIS — R079 Chest pain, unspecified: Secondary | ICD-10-CM | POA: Diagnosis not present

## 2016-10-22 DIAGNOSIS — I2510 Atherosclerotic heart disease of native coronary artery without angina pectoris: Secondary | ICD-10-CM | POA: Insufficient documentation

## 2016-10-22 DIAGNOSIS — I4891 Unspecified atrial fibrillation: Secondary | ICD-10-CM | POA: Diagnosis not present

## 2016-10-22 DIAGNOSIS — Z8249 Family history of ischemic heart disease and other diseases of the circulatory system: Secondary | ICD-10-CM | POA: Diagnosis not present

## 2016-10-22 DIAGNOSIS — I270 Primary pulmonary hypertension: Secondary | ICD-10-CM | POA: Diagnosis not present

## 2016-10-22 DIAGNOSIS — C61 Malignant neoplasm of prostate: Secondary | ICD-10-CM | POA: Diagnosis not present

## 2016-10-22 DIAGNOSIS — R0600 Dyspnea, unspecified: Secondary | ICD-10-CM | POA: Diagnosis not present

## 2016-10-27 ENCOUNTER — Telehealth: Payer: Self-pay | Admitting: Internal Medicine

## 2016-10-27 NOTE — Telephone Encounter (Signed)
Spoke to patient. Has recently had dyspnea had anginal symptoms for which he saw [facility] on 10/10. Echo was done, showing normal EF. He has a f/u on 10/24 with [facility].  Pt called today, because last night he had worse respiratory effort while getting up to go to the bathroom, and "fought for breath" for about 10-15 minutes. Thinks dyspnea has been worse in last 1-2 days.  He denies new fatigue, weight gain, leg swelling. He follows his weights daily closely and has not noted any new concerns or need to take extra furosemide. Denies worse resp effort when lying.   Informed pt I would seek recommendations. Note he has trend of above-therapeutic INRs, has a coumadin clinic appt sched this Friday and f/u w Isaac Laud on 10/24.

## 2016-10-27 NOTE — Telephone Encounter (Signed)
New message    Pt c/o Shortness Of Breath: STAT if SOB developed within the last 24 hours or pt is noticeably SOB on the phone  1. Are you currently SOB (can you hear that pt is SOB on the phone)? Yes, [name] states she can hear him breath heavily  2. How long have you been experiencing SOB? A week. Today is worse.  3. Are you SOB when sitting or when up moving around? Both.   4. Are you currently experiencing any other symptoms? No.   Pt also would like to know about echo results.

## 2016-10-27 NOTE — Telephone Encounter (Signed)
Daughter called to report that patient got up to use bathroom, felt even more short of breath than he did previously, states obvious worsening of breathing effort since earlier this morning. I have advised her w worsening of symptoms to call 911 or bring patient to closest ER for evaluation. She expressed understanding and agreement w plan.

## 2016-10-27 NOTE — Telephone Encounter (Signed)
I agree with ED eval, there was already some concern of ischemia, if breathing worsens, it would be either volume issue related to his kidney or worsening ischemia.

## 2016-10-29 ENCOUNTER — Ambulatory Visit (INDEPENDENT_AMBULATORY_CARE_PROVIDER_SITE_OTHER): Payer: Medicare Other | Admitting: Pharmacist Clinician (PhC)/ Clinical Pharmacy Specialist

## 2016-10-29 DIAGNOSIS — Z9889 Other specified postprocedural states: Secondary | ICD-10-CM

## 2016-10-29 DIAGNOSIS — I82409 Acute embolism and thrombosis of unspecified deep veins of unspecified lower extremity: Secondary | ICD-10-CM | POA: Diagnosis not present

## 2016-10-29 DIAGNOSIS — Z7901 Long term (current) use of anticoagulants: Secondary | ICD-10-CM

## 2016-10-29 DIAGNOSIS — I482 Chronic atrial fibrillation, unspecified: Secondary | ICD-10-CM

## 2016-10-29 LAB — POCT INR: INR: 1.8

## 2016-11-03 ENCOUNTER — Encounter: Payer: Self-pay | Admitting: Physician Assistant

## 2016-11-03 ENCOUNTER — Ambulatory Visit (INDEPENDENT_AMBULATORY_CARE_PROVIDER_SITE_OTHER): Payer: Medicare Other | Admitting: Physician Assistant

## 2016-11-03 VITALS — BP 132/69 | HR 113 | Ht 69.0 in | Wt 194.0 lb

## 2016-11-03 DIAGNOSIS — E785 Hyperlipidemia, unspecified: Secondary | ICD-10-CM | POA: Diagnosis not present

## 2016-11-03 DIAGNOSIS — N184 Chronic kidney disease, stage 4 (severe): Secondary | ICD-10-CM

## 2016-11-03 DIAGNOSIS — Z79899 Other long term (current) drug therapy: Secondary | ICD-10-CM

## 2016-11-03 DIAGNOSIS — I2510 Atherosclerotic heart disease of native coronary artery without angina pectoris: Secondary | ICD-10-CM | POA: Diagnosis not present

## 2016-11-03 DIAGNOSIS — I482 Chronic atrial fibrillation, unspecified: Secondary | ICD-10-CM

## 2016-11-03 DIAGNOSIS — I10 Essential (primary) hypertension: Secondary | ICD-10-CM | POA: Diagnosis not present

## 2016-11-03 DIAGNOSIS — I739 Peripheral vascular disease, unspecified: Secondary | ICD-10-CM

## 2016-11-03 DIAGNOSIS — R0602 Shortness of breath: Secondary | ICD-10-CM | POA: Diagnosis not present

## 2016-11-03 DIAGNOSIS — I5022 Chronic systolic (congestive) heart failure: Secondary | ICD-10-CM

## 2016-11-03 LAB — BASIC METABOLIC PANEL
BUN/Creatinine Ratio: 15 (ref 10–24)
BUN: 36 mg/dL — ABNORMAL HIGH (ref 8–27)
CO2: 20 mmol/L (ref 20–29)
Calcium: 9.3 mg/dL (ref 8.6–10.2)
Chloride: 101 mmol/L (ref 96–106)
Creatinine, Ser: 2.41 mg/dL — ABNORMAL HIGH (ref 0.76–1.27)
GFR calc Af Amer: 27 mL/min/{1.73_m2} — ABNORMAL LOW (ref >59)
GFR calc non Af Amer: 24 mL/min/{1.73_m2} — ABNORMAL LOW (ref >59)
GLUCOSE: 122 mg/dL — AB (ref 65–99)
POTASSIUM: 4 mmol/L (ref 3.5–5.2)
SODIUM: 140 mmol/L (ref 134–144)

## 2016-11-03 MED ORDER — METOPROLOL SUCCINATE ER 100 MG PO TB24: 100.0000 mg | ORAL_TABLET | Freq: Every day | ORAL | 3 refills | Status: DC

## 2016-11-03 MED ORDER — FUROSEMIDE 40 MG PO TABS: ORAL_TABLET | ORAL | 0 refills | Status: DC

## 2016-11-03 NOTE — Patient Instructions (Addendum)
Medication Instructions: INCREASE the Metoprolol (toprol) to a 100 mg tablet daily INCREASE the Furosemide to 80 mg (2 tablets) in the morning and 40 mg (one tablet) in the late afternoon  If you need a refill on your cardiac medications before your next appointment, please call your pharmacy.   Labwork: Your physician recommends that you have a BMET today and then return in one week to have a BMET drawn again. The [facility] office lab clinic is open Monday-Friday from 8 am to 4:30 pm. You do not need an appointment. It is closed from 1-2 pm for lunch.   Procedures/Testing: Your provider has requested that you have an abdominal ultrasound completed. This can be done at [facility] at [address]  Follow-Up: Your physician wants you to follow-up in 3 weeks with Dr. Debara Pickett.    Thank you for choosing Heartcare at [facility]!!

## 2016-11-03 NOTE — Progress Notes (Signed)
Cardiology Office Note    Date:  60/73/7106   ID:  Leocadio, Heal [birth year], MRN [id number]  PCP:  Willey Blade, MD  Cardiologist:  Dr. Debara Pickett  Primary nephrologist: Dr. Neta Ehlers at [facility]   Chief Complaint  Patient presents with  . Follow-up    seen for Dr. Debara Pickett    History of Present Illness:  Greg Booker is a 83 y.o. male with PMH of CAD (s/p BMS to RCA in 1997, occluded RCA by cath 2006 w collateral flow noted), chronic systolic heart failure with baseline EF 45%, PAD s/p left femoropopliteal bypass in 2017, chronic atrial fibrillation on Coumadin, history of DVT, hypertension, hyperlipidemia, stage IV CKD. He had a low risk nuclear stress test in 2015 and also in September 2017. He was last seen by Georgiana Spinner on 08/09/2016, 1 status symptoms to be stable at the time. He was instructed to continue on Lasix 40 mg twice a day. He was recently treated at an outside hospital for left SFA occlusion. He underwent open thrombectomy of the left SFA at bypass with angioplasty of the distal anastomosis.  I last saw the patient on 10/20/2016, he woke up at 3 AM on 10/16/2016 with chest pain, this did not go away until 5 AM. EKG showed atrial fibrillation however there was more prominent ST depression in the lateral leads. His symptom was concerning for unstable angina. We increased his Imdur to 90 mg daily. Due to his poor renal function, we decided to evaluate further with echocardiogram. Echocardiogram obtained on 10/22/2016 showed EF 55-60%, moderate AS with mean gradient 20 mmHg and peak gradient 37 mmHg, moderate MR, mild TR, severely dilated left atrium.   Since his last office visit, he has not had any further chest discomfort. I have reviewed his today's EKG with DOD Dr. Martinique who felt that ST depression is nonspecific and does not appears to have significantly changed compared to his last EKG on 10/20/2016. He was told that his PSA was very high recently. He has been  having more shortness of breath in the last week as well. His breath sound in the lung is diminished but clear without crackles, there is no significant lower extremity edema. Since he is on Coumadin, suspicion for DVT PE is very low. He has been noticing his abdomen continued to grow for the past week, he is now wearing a larger pant size. His abdomen is distended. I will obtain abdominal ultrasound to look for any ascites. I will also increase his Lasix to 80 mg in a.m. and 40 mg in p.m. He will need a basic metabolic panel today and also in 1 week. His heart rate is uncontrolled, he is in chronic atrial flutter ablation with heart rate of 110. I will increase his Toprol-XL to 100 mg daily. He will return in 3 weeks to follow-up with Dr. Debara Pickett.   Past Medical History:  Diagnosis Date  . Anticoagulated on Coumadin   . Arthritis   . Calcified granuloma of lung ([place]) RIGHT LUNG BASE - STABLE PER CXR 03-03-2010  . Chronic back pain   . CKD (chronic kidney disease), stage III ([place])   . Coronary artery disease    a. 1997 PCI/BMS to the RCA; b. 2006 Cath: Occluded RCA with collat flow, EF 60%, otw nonobs dzs; b. 2010 MV: No ischemia; c. 06/2013 MV: low risk w/o ischemia.  . Diabetes mellitus without complication ([place])   . Falls   . GERD (gastroesophageal reflux  disease)   . Heart murmur   . History of DVT (deep vein thrombosis)    a. Early 2000's.  Marland Kitchen History of pneumonia 01/2013  . Hyperlipidemia   . Hypertensive heart disease   . Impaired hearing BILATERAL AIDS  . Low back pain radiating to left leg   . Mitral regurgitation   . Nocturia   . Numbness and tingling of left leg   . PAF (paroxysmal atrial fibrillation) (HCC)    a. CHA2DS2VASc = 5-->chronic coumadin.  . Prostate cancer ([place]) RECUR   a. 2006 S/P radioactive seed implant.  . Sciatic nerve pain LEFT LEG PAIN--  EPI INJECTIONS  . Urgency of urination     Past Surgical History:  Procedure Laterality Date  . BACK SURGERY    .  CARDIAC CATHETERIZATION  1998   IN-STENT RESTENOSIS-- LARGE COLLATERALS  . CARDIAC CATHETERIZATION  2003  &  2006   2006 ---  MITRAL REGURG/ 100% OCCULSION RCA WITH LARGE COLLATERALS/ MILD DISEASE LAD & CIRCUMFLEX  . CARDIOVASCULAR STRESS TEST  06-03-2008   NORMAL STUDY/ LV NORMAL / COMPARED TO 12-26-2000 INFERIOR WALL ISCHEMIA IS NO LONGER PRESENT.  Marland Kitchen CARDIOVERSION  09-15-2005   UNSUCCESSFUL  . CORONARY ANGIOPLASTY WITH STENT PLACEMENT  1997- DR AL LITTLE   X1 STENT RCA  . CRYOABLATION  03/01/2011   Procedure: CRYO ABLATION PROSTATE;  Surgeon: Ailene Rud, MD;  Location: [facility];  Service: Urology;  Laterality: N/A;  . EXCISION BENIGN BREAST MASS  08-06-2008   LEFT  . JOINT REPLACEMENT    . LAMINECTOMY  10-28-2008   L3 - 4  . LUMBAR LAMINECTOMY/DECOMPRESSION MICRODISCECTOMY Right 04/05/2013   Procedure: Right Lumbar three-four Laminectomy ;  Surgeon: Charlie Pitter, MD;  Location: [facility];  Service: Neurosurgery;  Laterality: Right;  . RADIOACTIVE SEED IMPLANT  08-03-2004   PROSTATE  . TOTAL HIP ARTHROPLASTY  11/02/2011   Procedure: TOTAL HIP ARTHROPLASTY;  Surgeon: Garald Balding, MD;  Location: [facility];  Service: Orthopedics;  Laterality: Left;  Left Total Hip Replacement  . TOTAL HIP ARTHROPLASTY Right 06/26/2013   Procedure: TOTAL HIP ARTHROPLASTY;  Surgeon: Garald Balding, MD;  Location: [facility];  Service: Orthopedics;  Laterality: Right;  . TRANSTHORACIC ECHOCARDIOGRAM  07-26-2007   LVSF NORMAL/ EF 55%/ LEFT ATRIUM 4.5CM MILD TO MOD. DILATED/ MILD  MITRAL & TRICUSPID REGURG    Current Medications: Outpatient Medications Prior to Visit  Medication Sig Dispense Refill  . aspirin EC 81 MG tablet Take 81 mg by mouth daily.     . B Complex Vitamins (VITAMIN B COMPLEX PO) Take 1 tablet by mouth daily.     . bicalutamide (CASODEX) 50 MG tablet Take 50 mg by mouth daily.    . cephALEXin (KEFLEX) 500 MG capsule Take 500 mg by mouth 2 (two) times daily.     . Cholecalciferol (VITAMIN D3) 5000 units CAPS Take 5,000 Units by mouth daily.    Marland Kitchen diltiazem (CARDIZEM CD) 120 MG 24 hr capsule Take 120 mg by mouth daily.    Marland Kitchen docusate sodium (COLACE) 100 MG capsule Take 1 capsule (100 mg total) by mouth 2 (two) times daily as needed for mild constipation. 30 capsule 0  . Evolocumab with Infusor ([place]) 420 MG/3.5ML SOCT Inject 420 mg into the skin every 30 (thirty) days.    Marland Kitchen gabapentin (NEURONTIN) 100 MG capsule Take 300 mg by mouth at bedtime.     . isosorbide mononitrate (IMDUR) 60  MG 24 hr tablet TAKE 1 AND 1/2 TABLET BY MOUTH DAILY 45 tablet 5  . Multiple Vitamins-Minerals (MULTIVITAMIN WITH MINERALS) tablet Take 1 tablet by mouth daily.    . nitroGLYCERIN (NITROSTAT) 0.4 MG SL tablet Place 1 tablet (0.4 mg total) under the tongue every 5 (five) minutes as needed for chest pain. 25 tablet PRN  . omeprazole (PRILOSEC) 20 MG capsule Take 20 mg by mouth daily.    Marland Kitchen oxyCODONE-acetaminophen (PERCOCET/ROXICET) 5-325 MG tablet Take 1-2 tablets by mouth every 6 (six) hours as needed for severe pain. 20 tablet 0  . ranolazine (RANEXA) 500 MG 12 hr tablet Take 1 tablet (500 mg total) by mouth 2 (two) times daily. 60 tablet 5  . Tamsulosin HCl (FLOMAX) 0.4 MG CAPS Take 0.4 mg by mouth daily after supper.    . vitamin C (ASCORBIC ACID) 500 MG tablet Take 500 mg by mouth daily.    Marland Kitchen warfarin (COUMADIN) 5 MG tablet TAKE ONE-HALF TO ONE TABLETS BY MOUTH ONCE DAILY AS DIRECTED BY COUMADIN CLINIC (Patient taking differently: Take 2.5 mg by mouth in the evening on Sun/Tues/Thurs/Sat and 5 mg on Mon/Wed/Fri) 90 tablet 0  . furosemide (LASIX) 40 MG tablet TAKE 1 TABLET BY MOUTH TWO  TIMES DAILY 180 tablet 0  . metoprolol succinate (TOPROL-XL) 25 MG 24 hr tablet Take 3 tablets (75 mg total) by mouth daily. Take with or immediately following a meal. 270 tablet 3   No facility-administered medications prior to visit.      Allergies:   Bicalutamide;  Codeine; Niacin and related; Lovastatin; Beef extract; and Pravastatin   Social History   Social History  . Marital status: Widowed    Spouse name: N/A  . Number of children: N/A  . Years of education: N/A   Social History Main Topics  . Smoking status: Former Smoker    Packs/day: 1.00    Years: 25.00    Types: Cigarettes, Pipe    Quit date: 02/23/1972  . Smokeless tobacco: Never Used  . Alcohol use No  . Drug use: No  . Sexual activity: Not Asked   Other Topics Concern  . None   Social History Narrative   Lives with male friend in a one story home with basement.  Has no children.     Retired from Architect, Hospital doctor, Therapist, music.     Education: some college.      Family History:  The patient's family history includes Arthritis in his mother; Breast cancer in his sister; Cancer in his sister; Heart Problems in his brother and father; Heart disease in his brother; Other (age of onset: 78) in his mother.   ROS:   Please see the history of present illness.    ROS All other systems reviewed and are negative.   PHYSICAL EXAM:   VS:  BP 132/69   Pulse (!) 113   Ht 5\' 9"  (1.753 m)   Wt 194 lb (88 kg)   SpO2 100%   BMI 28.65 kg/m    GEN: Well nourished, well developed, in no acute distress  HEENT: normal  Neck: no JVD, carotid bruits, or masses Cardiac: irregularly irregular; no murmurs, rubs, or gallops,no edema  Respiratory:  clear to auscultation bilaterally, normal work of breathing GI: nontender, + BS   +hard, distended MS: no deformity or atrophy  Skin: warm and dry, no rash Neuro:  Alert and Oriented x 3, Strength and sensation are intact Psych: euthymic mood, full affect  Wt Readings from Last 3 Encounters:  11/03/16 194 lb (88 kg)  10/20/16 183 lb 9.6 oz (83.3 kg)  08/09/16 186 lb (84.4 kg)      Studies/Labs Reviewed:   EKG:  EKG is ordered today.  The ekg ordered today demonstrates Atrial fibrillation with RVR,  heart rate 111, nonspecific ST changes. Reviewed with DOD Dr. Martinique  Recent Labs: 04/01/2016: Brain Natriuretic Peptide 772.5 05/07/2016: Hemoglobin 10.4; Platelets 259 11/03/2016: BUN 36; Creatinine, Ser 2.41; Potassium 4.0; Sodium 140   Lipid Panel No results found for: CHOL, TRIG, HDL, CHOLHDL, VLDL, LDLCALC, LDLDIRECT  Additional studies/ records that were reviewed today include:   LV EF: 55% -   60%  ------------------------------------------------------------------- Indications:      Chest Pain (R07.9).  ------------------------------------------------------------------- History:   PMH:   Dyspnea and murmur.  Atrial fibrillation. Coronary artery disease.  Primary pulmonary hypertension.  Risk factors:  Chronic Kidney Disease, Deep Vein Thrombosis, Prostate Cancer. Family history of coronary artery disease. Former tobacco use. Diabetes mellitus. Dyslipidemia.  ------------------------------------------------------------------- Study Conclusions  - Left ventricle: The cavity size was normal. Wall thickness was   increased in a pattern of moderate LVH. Systolic function was   normal. The estimated ejection fraction was in the range of 55%   to 60%. Wall motion was normal; there were no regional wall   motion abnormalities. The study is not technically sufficient to   allow evaluation of LV diastolic function. - Aortic valve: Calcified leaflets. Moderate stenosis. There was   trivial regurgitation. Mean gradient (S): 20 mm Hg. Peak gradient   (S): 37 mm Hg. Valve area (VTI): 0.92 cm^2. Valve area (Vmax):   0.93 cm^2. Valve area (Vmean): 0.92 cm^2. - Aorta: Ascending aortic diameter: 38 mm (S). - Aortic root: The aortic root was top normal in size. - Mitral valve: Calcified annulus. Mildly thickened leaflets .   There was mild to moderate regurgitation. - Left atrium: Severely dilated. - Right atrium: Moderately dilated. - Tricuspid valve: There was mild  regurgitation. - Pulmonary arteries: PA peak pressure: 35 mm Hg (S). - Inferior vena cava: The vessel was normal in size. The   respirophasic diameter changes were in the normal range (>= 50%),   consistent with normal central venous pressure.  Impressions:  - Compared to a prior study in 03/2016, the LVEF has improved to   55-60%. There is moderate aortic stenosis with an AVA around 1   cm2 along with severe LAE and moderate RAE.    Echo 10/22/2016 LV EF: 55% -   60%  Study Conclusions  - Left ventricle: The cavity size was normal. Wall thickness was   increased in a pattern of moderate LVH. Systolic function was   normal. The estimated ejection fraction was in the range of 55%   to 60%. Wall motion was normal; there were no regional wall   motion abnormalities. The study is not technically sufficient to   allow evaluation of LV diastolic function. - Aortic valve: Calcified leaflets. Moderate stenosis. There was   trivial regurgitation. Mean gradient (S): 20 mm Hg. Peak gradient   (S): 37 mm Hg. Valve area (VTI): 0.92 cm^2. Valve area (Vmax):   0.93 cm^2. Valve area (Vmean): 0.92 cm^2. - Aorta: Ascending aortic diameter: 38 mm (S). - Aortic root: The aortic root was top normal in size. - Mitral valve: Calcified annulus. Mildly thickened leaflets .   There was mild to moderate regurgitation. - Left atrium: Severely dilated. - Right atrium: Moderately dilated. -  Tricuspid valve: There was mild regurgitation. - Pulmonary arteries: PA peak pressure: 35 mm Hg (S). - Inferior vena cava: The vessel was normal in size. The   respirophasic diameter changes were in the normal range (>= 50%),   consistent with normal central venous pressure.  Impressions:  - Compared to a prior study in 03/2016, the LVEF has improved to   55-60%. There is moderate aortic stenosis with an AVA around 1   cm2 along with severe LAE and moderate RAE.   ASSESSMENT:    1. Chronic atrial  fibrillation with RVR (HCC)   2. Hypertension, unspecified type   3. Medication management   4. Shortness of breath   5. Coronary artery disease involving native coronary artery of native heart without angina pectoris   6. Chronic systolic heart failure ([place])   7. PAD (peripheral artery disease) ([place])   8. Hyperlipidemia, unspecified hyperlipidemia type   9. Chronic kidney disease (CKD), stage IV (severe) (HCC)      PLAN:  In order of problems listed above:  1. Chronic atrial fibrillation with RVR: We'll increase metoprolol to 100 mg daily to better control heart rate. Continue Coumadin.  2. Shortness of breath: Has been going on for the past week, although he does not have any lower extremity edema or crackles on physical exam, he his abdomen is distended, he his weight has been increasing as well. I will put him a trial of higher dose of Lasix, he will take 80 mg in a.m. and 40 mg in p.m. He has significant incontinence, therefore hesitant to put him on high-dose Lasix throughout the day. He will need a basic metabolic panel today and in one week. I plan to obtain an abdominal ultrasound to check for ascites.  3. PAD: History of left femoral-femoral bypass, recently underwent emergent surgery for occluded SFA. Followed by a vascular surgery  4. CAD: Nonspecific ST changes on EKG, although he did have a prolonged chest pain 3 weeks ago, he has not had any further chest pain since. Echocardiogram obtained recently showed a normal ejection fraction. Unless any recurrence of chest discomfort, we did not plan for invasive study given his poor renal function.  5. Hypertension: Blood pressure well controlled. Increase metoprolol to 100 mg daily  6. Hyperlipidemia: Intolerant to multiple statins, on Repatha  7. CKD stage IV: obtain BMET today    Medication Adjustments/Labs and Tests Ordered: Current medicines are reviewed at length with the patient today.  Concerns regarding medicines are  outlined above.  Medication changes, Labs and Tests ordered today are listed in the Patient Instructions below. Patient Instructions  Medication Instructions: INCREASE the Metoprolol (toprol) to a 100 mg tablet daily INCREASE the Furosemide to 80 mg (2 tablets) in the morning and 40 mg (one tablet) in the late afternoon  If you need a refill on your cardiac medications before your next appointment, please call your pharmacy.   Labwork: Your physician recommends that you have a BMET today and then return in one week to have a BMET drawn again. The [facility] office lab clinic is open Monday-Friday from 8 am to 4:30 pm. You do not need an appointment. It is closed from 1-2 pm for lunch.   Procedures/Testing: Your provider has requested that you have an abdominal ultrasound completed. This can be done at [facility] at [address]  Follow-Up: Your physician wants you to follow-up in 3 weeks with Dr. Debara Pickett.    Thank you for choosing Heartcare  at [facility], [place], [place]  11/03/2016 9:53 PM    [facility] [place], [place], [place]  [address] Phone: [phone]; Fax: [phone]

## 2016-11-04 ENCOUNTER — Telehealth: Payer: Self-pay | Admitting: Physician Assistant

## 2016-11-04 ENCOUNTER — Other Ambulatory Visit: Payer: Self-pay | Admitting: *Deleted

## 2016-11-04 ENCOUNTER — Telehealth: Payer: Self-pay | Admitting: Internal Medicine

## 2016-11-04 DIAGNOSIS — R188 Other ascites: Secondary | ICD-10-CM

## 2016-11-04 DIAGNOSIS — R0602 Shortness of breath: Secondary | ICD-10-CM

## 2016-11-04 NOTE — Telephone Encounter (Signed)
Sarah from [facility] called to verify that the patient's ultrasound should be an Abdominal Complete and to have ascites added to the diagnosis. According to Almyra Deforest, PA this was correct. A new order had to be placed to add the new diagnosis. Judson Roch has been notified and verbalized her understanding.

## 2016-11-04 NOTE — Telephone Encounter (Signed)
wrong prvider

## 2016-11-04 NOTE — Telephone Encounter (Signed)
Returned the call to the patient and informed him that a call was placed to [facility]. They will call him today to get the appointment set up. He verbalized his understanding.

## 2016-11-04 NOTE — Progress Notes (Signed)
If renal function worsen on repeat, will need to decrease lasix back down to 40mg  BID

## 2016-11-04 NOTE — Telephone Encounter (Signed)
New Message     Pt called said he went to [facility] to have test done and they told him you have to call and set up appt for him.

## 2016-11-05 ENCOUNTER — Telehealth: Payer: Self-pay | Admitting: *Deleted

## 2016-11-05 DIAGNOSIS — Z79899 Other long term (current) drug therapy: Secondary | ICD-10-CM

## 2016-11-05 NOTE — Telephone Encounter (Signed)
-----   Message from [place], [place] sent at 11/04/2016  5:36 PM EDT ----- Kidney function worsened slightly, instead of repeat in 1 week, prefer to repeat on Monday to make sure kidney function does not worsen. Forward lab to his nephrologist at [facility], Dr. Neta Ehlers.

## 2016-11-05 NOTE — Telephone Encounter (Signed)
Called and spoke to patient, advised, BMET ordered, he will come Monday for labs.

## 2016-11-08 LAB — BASIC METABOLIC PANEL
BUN/Creatinine Ratio: 15 (ref 10–24)
BUN: 38 mg/dL — AB (ref 8–27)
CALCIUM: 8.8 mg/dL (ref 8.6–10.2)
CO2: 17 mmol/L — AB (ref 20–29)
CREATININE: 2.53 mg/dL — AB (ref 0.76–1.27)
Chloride: 103 mmol/L (ref 96–106)
GFR calc Af Amer: 26 mL/min/{1.73_m2} — ABNORMAL LOW (ref >59)
GFR, EST NON AFRICAN AMERICAN: 22 mL/min/{1.73_m2} — AB (ref >59)
Glucose: 144 mg/dL — ABNORMAL HIGH (ref 65–99)
Potassium: 3.9 mmol/L (ref 3.5–5.2)
Sodium: 141 mmol/L (ref 134–144)

## 2016-11-08 NOTE — Telephone Encounter (Signed)
Kidney function worsened slightly, recommend go back to 40mg  BID lasix. Repeat BMET in 2-3 weeks

## 2016-11-10 ENCOUNTER — Telehealth: Payer: Self-pay | Admitting: *Deleted

## 2016-11-10 DIAGNOSIS — Z79899 Other long term (current) drug therapy: Secondary | ICD-10-CM

## 2016-11-10 MED ORDER — FUROSEMIDE 40 MG PO TABS: 40.0000 mg | ORAL_TABLET | Freq: Two times a day (BID) | ORAL | 0 refills | Status: DC

## 2016-11-10 NOTE — Telephone Encounter (Signed)
-----   Message from [place], [place] sent at 11/08/2016  5:12 PM EDT ----- Kidney function worsened slightly, recommend go back to 40mg  BID lasix. Repeat BMET in 2-3 weeks

## 2016-11-10 NOTE — Telephone Encounter (Signed)
Patient has been made aware to decrease the Lasix to 40 mg bid and will come back in 2-3 weeks for a repeat BMET. He verbalized his understanding.

## 2016-11-12 ENCOUNTER — Other Ambulatory Visit: Payer: Medicare Other

## 2016-11-12 ENCOUNTER — Other Ambulatory Visit: Payer: Self-pay | Admitting: Physician Assistant

## 2016-11-12 ENCOUNTER — Ambulatory Visit
Admission: RE | Admit: 2016-11-12 | Discharge: 2016-11-12 | Disposition: A | Discharge status: Home / Self Care | Payer: Medicare Other | Source: Ambulatory Visit | Attending: Physician Assistant | Admitting: Physician Assistant

## 2016-11-12 ENCOUNTER — Ambulatory Visit (INDEPENDENT_AMBULATORY_CARE_PROVIDER_SITE_OTHER): Payer: Medicare Other | Admitting: Pharmacist Clinician (PhC)/ Clinical Pharmacy Specialist

## 2016-11-12 DIAGNOSIS — R188 Other ascites: Secondary | ICD-10-CM

## 2016-11-12 DIAGNOSIS — R0602 Shortness of breath: Secondary | ICD-10-CM

## 2016-11-12 DIAGNOSIS — I482 Chronic atrial fibrillation, unspecified: Secondary | ICD-10-CM

## 2016-11-12 DIAGNOSIS — Z7901 Long term (current) use of anticoagulants: Secondary | ICD-10-CM

## 2016-11-12 DIAGNOSIS — I82409 Acute embolism and thrombosis of unspecified deep veins of unspecified lower extremity: Secondary | ICD-10-CM

## 2016-11-12 DIAGNOSIS — Z9889 Other specified postprocedural states: Secondary | ICD-10-CM

## 2016-11-12 LAB — POCT INR: INR: 4.3

## 2016-11-30 ENCOUNTER — Ambulatory Visit: Payer: Medicare Other | Admitting: Internal Medicine

## 2016-11-30 ENCOUNTER — Ambulatory Visit (INDEPENDENT_AMBULATORY_CARE_PROVIDER_SITE_OTHER): Payer: Medicare Other | Admitting: Pharmacist

## 2016-11-30 ENCOUNTER — Encounter: Payer: Self-pay | Admitting: Internal Medicine

## 2016-11-30 VITALS — BP 130/77 | HR 115 | Ht 69.0 in | Wt 186.2 lb

## 2016-11-30 DIAGNOSIS — I482 Chronic atrial fibrillation, unspecified: Secondary | ICD-10-CM

## 2016-11-30 DIAGNOSIS — E785 Hyperlipidemia, unspecified: Secondary | ICD-10-CM | POA: Diagnosis not present

## 2016-11-30 DIAGNOSIS — I350 Nonrheumatic aortic (valve) stenosis: Secondary | ICD-10-CM

## 2016-11-30 DIAGNOSIS — Z7901 Long term (current) use of anticoagulants: Secondary | ICD-10-CM

## 2016-11-30 DIAGNOSIS — Z9889 Other specified postprocedural states: Secondary | ICD-10-CM

## 2016-11-30 DIAGNOSIS — I82409 Acute embolism and thrombosis of unspecified deep veins of unspecified lower extremity: Secondary | ICD-10-CM

## 2016-11-30 LAB — POCT INR: INR: 6.2

## 2016-11-30 NOTE — Patient Instructions (Addendum)
Your physician recommends that you return for lab work TODAY - BMET, PT-INR  Your physician recommends that you return for lab work FASTING to check cholesterol   Your physician wants you to follow-up in: 6 months with Dr. Debara Pickett. You will receive a reminder letter in the mail two months in advance. If you don't receive a letter, please call our office to schedule the follow-up appointment.

## 2016-11-30 NOTE — Progress Notes (Signed)
OFFICE NOTE  Chief Complaint:  No complaints  Primary Care Physician: Willey Blade, MD  HPI:  Greg Booker is an 83 year old gentleman followed by Dr. Rex Kras with history of coronary disease in the past. Cardiac catheterization in 2006 showed an occluded right coronary with collaterals, EF 50%. A stress test in 2010 most recently was negative for ischemia. He has had trouble with DVT and has been off Coumadin for periods of time. Recently he had hip surgery, which he underwent uneventfully and has done well and is back on his Coumadin. Unfortunately, his levels were too high; as of January 3 his INR was 6.9.  It now seems to be more regulated. He is describing no symptoms of bleeding, shortness of breath, palpitations, presyncope, syncopal symptoms, chest pain, or any associated cardiac findings.   Greg Booker returns today and is feeling quite well. Unfortunately a few weeks ago he developed a pneumonia and was treated for this. He is now almost completely recovered. Fortunately, his warfarin levels have remained therapeutic. His gotten back to his activities and is looking forward to going on a cruise for the next week.  I had the pleasure of seeing Greg Booker back today in follow-up. He was last seen in 2015 and underwent a stress test which is negative for ischemia as part of a preoperative workup for hip replacement. He ultimately underwent hip replacement is doing very well. He denies any chest pain or worsening shortness of breath. I reviewed his medicines today noted that he is in chronic/permanent atrial fibrillation. His INR is a been stable and followed by Erasmo Downer, our anticoagulation pharmacist. He has been on long-term digoxin which was started by his previous cardiologist. He does have stage III chronic kidney disease. Recently there is data from a couple of very good studies which indicate that digoxin may increase the risk of mortality and that there was a clear cut off of  digoxin levels greater than 1.2 which wasn't associated with increased mortality, especially in patients with known coronary artery disease. He has not had a digoxin level tested in some time - it would be recommended that he has this testing done at least every 6 months.  06/12/2015  Greg Booker was seen back today in follow-up. Recently he was seen by Ignacia Bayley, NP, for imbalance, dizziness and difficulty walking. He denied any true syncopal episodes. [name] ordered an echocardiogram and monitor. The monitor showed rate controlled A. fib without any significant pauses or tachyarrhythmias. The echocardiogram showed moderate aortic stenosis with normal LV function. Neither study indicated a clear etiology of his imbalance and dizziness. Concern is for his fall risk as he is on warfarin anticoagulation. Mr. Broz does report he has a history of low back pain and neurosurgery.  01/21/2015  Greg Booker was seen today in follow-up. He recently was admitted at [facility] for acute limb ischemia of the left leg. 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 He ultimately had a peripheral angiogram and underwent left femoral-tibial bypass. He has had some difficulty in wound healing and is getting home health care. During this hospitalization he had no worsening chest pain or unstable angina. He had a recent stress test which showed no ischemia but a fixed inferior defect and was started on Ranexa for some exertional chest pain. This seems to be working well for him. His A. fib was somewhat uncontrolled during his hospitalization and the rate is somewhere around 100 today. He is reestablishing on  warfarin. He is not on an antiplatelet, but should be. Is also not had a recent lipid profile. In the past she's been intolerant to pravastatin, lovastatin and niacin causing myalgias and pruritus, respectively.  04/01/2016  Greg Booker returns today for follow-up. He is recently had some worsening shortness of  breath and lower extremity swelling. He had lower extremity arterial Dopplers this morning and I receive those preliminary reports indicating that his left arterial system is occluded. There is a significant reduction ABI of 0.57 on the right. He seems to have significant swelling on the right as well. He reports 2-3 pillow orthopnea which is new and shortness of breath with exertion. Weight is up to 194 from 189 pounds. EKG shows A. fib at 93 today with nonspecific ST and T-wave changes.   04/07/2016  Greg Booker returns today for follow-up. Since I last saw him last week he has had improvement in his breathing. He's not needed as many pillows to sleep at night. Weight has decreased from 194-191 pounds with his dry weight somewhere around 189 pounds. He is in A. fib with RVR today and was at a much faster rate yesterday when he had his echo performed. His echocardiogram did show a newly reduced EF to 45% with some diffuse hypokinesis. I suspect this is related to tachycardia related to A. fib. BNP was elevated over 700 with an elevated creatinine to 1.85. He is scheduled to see his nephrologist tomorrow. Hopefully that will repeat his creatinine at that time.  06/29/2016  Greg Booker is seen today in follow-up. Over the past several months she's done well. His weight is asked to down further to 181 from 191 pounds. He denies any worsening shortness of breath or swelling. Heart rate was slightly fast today with A. fib at 109. Blood pressure the right arm was 98/69 and checked in the left arm was 110/70. He does have some PAD and underwent left femoral-tibial bypass. He continues to report some discomfort in the left leg and is followed by Dr. Maryjean Morn in [facility]. Previously increased his metoprolol for rate control however there is little room to increase that further today. He says he is ambulating fairly well. He was in the ER in April the fall and had a compression fracture of the lumbar spine. He is apparently  seeing neurosurgery for this but has not undergone any procedures. His aortic murmur sounds stable compared to prior evaluation.  11/30/2016  Mr. Glasscock returns today for follow-up.  Recently had an episode of chest pain.  He was seen by 1 of our physician assistants and he is nitrates were adjusted.  Subsequently he was short of breath and felt that this could be related to heart failure.  Repeat echo was ordered and his Lasix was increased.  This led to worsening of renal dysfunction and his Lasix was then decreased.  His echo shows in fact his heart function has normalized, with EF up to 55-60%, however he still has moderate aortic stenosis.  We discussed this at some length today and he understands that he may very well need to face aortic valve replacement at some point in the future.  I suspect this would be TAVR procedure as he has multiple comorbidities.  There would be a high risk of renal failure associated with that and possibly pushed him onto dialysis.  He is also on the [facility] [facility] system.  He has been using it for several months however we do not have recent lab  work to reassess his lipid profile.  He has been intolerant to statins.  PMHx:  Past Medical History:  Diagnosis Date  . Anticoagulated on Coumadin   . Arthritis   . Calcified granuloma of lung ([place]) RIGHT LUNG BASE - STABLE PER CXR 03-03-2010  . Chronic back pain   . CKD (chronic kidney disease), stage III ([place])   . Coronary artery disease    a. 1997 PCI/BMS to the RCA; b. 2006 Cath: Occluded RCA with collat flow, EF 60%, otw nonobs dzs; b. 2010 MV: No ischemia; c. 06/2013 MV: low risk w/o ischemia.  . Diabetes mellitus without complication ([place])   . Falls   . GERD (gastroesophageal reflux disease)   . Heart murmur   . History of DVT (deep vein thrombosis)    a. Early 2000's.  Marland Kitchen History of pneumonia 01/2013  . Hyperlipidemia   . Hypertensive heart disease   . Impaired hearing BILATERAL AIDS  . Low back pain  radiating to left leg   . Mitral regurgitation   . Nocturia   . Numbness and tingling of left leg   . PAF (paroxysmal atrial fibrillation) (HCC)    a. CHA2DS2VASc = 5-->chronic coumadin.  . Prostate cancer ([place]) RECUR   a. 2006 S/P radioactive seed implant.  . Sciatic nerve pain LEFT LEG PAIN--  EPI INJECTIONS  . Urgency of urination     Past Surgical History:  Procedure Laterality Date  . BACK SURGERY    . CARDIAC CATHETERIZATION  1998   IN-STENT RESTENOSIS-- LARGE COLLATERALS  . CARDIAC CATHETERIZATION  2003  &  2006   2006 ---  MITRAL REGURG/ 100% OCCULSION RCA WITH LARGE COLLATERALS/ MILD DISEASE LAD & CIRCUMFLEX  . CARDIOVASCULAR STRESS TEST  06-03-2008   NORMAL STUDY/ LV NORMAL / COMPARED TO 12-26-2000 INFERIOR WALL ISCHEMIA IS NO LONGER PRESENT.  Marland Kitchen CARDIOVERSION  09-15-2005   UNSUCCESSFUL  . CORONARY ANGIOPLASTY WITH STENT PLACEMENT  1997- DR AL LITTLE   X1 STENT RCA  . CRYO ABLATION PROSTATE N/A 03/01/2011   Performed by Ailene Rud, MD at [facility]  . EXCISION BENIGN BREAST MASS  08-06-2008   LEFT  . JOINT REPLACEMENT    . LAMINECTOMY  10-28-2008   L3 - 4  . RADIOACTIVE SEED IMPLANT  08-03-2004   PROSTATE  . Right Lumbar three-four Laminectomy  Right 04/05/2013   Performed by Charlie Pitter, MD at [facility] NEURO ORS  . TOTAL HIP ARTHROPLASTY Right 06/26/2013   Performed by Garald Balding, MD at [facility]  . TOTAL HIP ARTHROPLASTY Left 11/02/2011   Performed by Garald Balding, MD at [facility]  . TRANSTHORACIC ECHOCARDIOGRAM  07-26-2007   LVSF NORMAL/ EF 55%/ LEFT ATRIUM 4.5CM MILD TO MOD. DILATED/ MILD  MITRAL & TRICUSPID REGURG    FAMHx:  Family History  Problem Relation Age of Onset  . Arthritis Mother   . Other Mother 32  . Heart Problems Father   . Heart disease Brother   . Breast cancer Sister   . Cancer Sister   . Heart Problems Brother     SOCHx:   reports that he quit smoking about 44 years ago. His smoking use included  cigarettes and pipe. He has a 25.00 pack-year smoking history. he has never used smokeless tobacco. He reports that he does not drink alcohol or use drugs.  ALLERGIES:  Allergies  Allergen Reactions  . Bicalutamide Other (See Comments)    Casodex caused increase in  breast size  . Codeine Itching and Rash  . Niacin And Related Other (See Comments)    Burning and stinging from tablets (otc capsules ok)  . Lovastatin Other (See Comments)    Leg weakness  . Beef Extract Rash  . Pravastatin Other (See Comments)    Myalgia    ROS: Pertinent items noted in HPI and remainder of comprehensive ROS otherwise negative.  HOME MEDS: Current Outpatient Medications  Medication Sig Dispense Refill  . aspirin EC 81 MG tablet Take 81 mg by mouth daily.     . B Complex Vitamins (VITAMIN B COMPLEX PO) Take 1 tablet by mouth daily.     . bicalutamide (CASODEX) 50 MG tablet Take 50 mg by mouth daily.    . Cholecalciferol (VITAMIN D3) 5000 units CAPS Take 5,000 Units by mouth daily.    Marland Kitchen diltiazem (CARDIZEM CD) 120 MG 24 hr capsule Take 120 mg by mouth daily.    Marland Kitchen docusate sodium (COLACE) 100 MG capsule Take 1 capsule (100 mg total) by mouth 2 (two) times daily as needed for mild constipation. 30 capsule 0  . Evolocumab with Infusor ([place]) 420 MG/3.5ML SOCT Inject 420 mg into the skin every 30 (thirty) days.    . furosemide (LASIX) 40 MG tablet Take 1 tablet (40 mg total) by mouth 2 (two) times daily. 180 tablet 0  . gabapentin (NEURONTIN) 100 MG capsule Take 300 mg by mouth at bedtime.     . isosorbide mononitrate (IMDUR) 60 MG 24 hr tablet TAKE 1 AND 1/2 TABLET BY MOUTH DAILY 45 tablet 5  . metoprolol succinate (TOPROL-XL) 100 MG 24 hr tablet Take 1 tablet (100 mg total) by mouth daily. Take with or immediately following a meal. 90 tablet 3  . Multiple Vitamins-Minerals (MULTIVITAMIN WITH MINERALS) tablet Take 1 tablet by mouth daily.    . nitroGLYCERIN (NITROSTAT) 0.4 MG SL tablet  Place 1 tablet (0.4 mg total) under the tongue every 5 (five) minutes as needed for chest pain. 25 tablet PRN  . omeprazole (PRILOSEC) 20 MG capsule Take 20 mg by mouth daily.    . ranolazine (RANEXA) 500 MG 12 hr tablet Take 1 tablet (500 mg total) by mouth 2 (two) times daily. 60 tablet 5  . Tamsulosin HCl (FLOMAX) 0.4 MG CAPS Take 0.4 mg by mouth daily after supper.    . vitamin C (ASCORBIC ACID) 500 MG tablet Take 500 mg by mouth daily.    Marland Kitchen warfarin (COUMADIN) 5 MG tablet TAKE ONE-HALF TO ONE TABLETS BY MOUTH ONCE DAILY AS DIRECTED BY COUMADIN CLINIC (Patient taking differently: Take 2.5 mg by mouth in the evening on Sun/Tues/Thurs/Sat and 5 mg on Mon/Wed/Fri) 90 tablet 0  . cephALEXin (KEFLEX) 500 MG capsule Take 500 mg by mouth 2 (two) times daily.    Marland Kitchen oxyCODONE-acetaminophen (PERCOCET/ROXICET) 5-325 MG tablet Take 1-2 tablets by mouth every 6 (six) hours as needed for severe pain. 20 tablet 0   No current facility-administered medications for this visit.     LABS/IMAGING: No results found for this or any previous visit (from the past 48 hour(s)). No results found.  VITALS: BP 130/77   Pulse (!) 115   Ht 5\' 9"  (1.753 m)   Wt 186 lb 3.2 oz (84.5 kg)   SpO2 98%   BMI 27.50 kg/m   EXAM: General appearance: alert, appears older than stated age and no distress Neck: no carotid bruit, no JVD and thyroid not enlarged, symmetric, no tenderness/mass/nodules Lungs:  clear to auscultation bilaterally Heart: irregularly irregular rhythm, S1, S2 normal and systolic murmur: late systolic 3/6, crescendo at 2nd right intercostal space Abdomen: soft, non-tender; bowel sounds normal; no masses,  no organomegaly Extremities: extremities normal, atraumatic, no cyanosis or edema and LLE notable for bypass, 1+ pulses Pulses: 2+ and symmetric Skin: Skin color, texture, turgor normal. No rashes or lesions Neurologic: Grossly normal Psych: Pleasant  EKG: Deferred  ASSESSMENT: 1. Chronic  systolic congestive heart failure - LVEF decreased to 45% (global HK) 03/2016 2. PAD with recent left critical limb ischemia-status post 3 digit amputation and left femoral-tibial bypass (Dr. Maryjean Morn at [facility]) 3. Dizziness, imbalance, frequent falls 4. Permanent atrial fibrillation - CHADSVASC score of 5 (anticoagulated on warfarin) 5. Coronary artery disease with occluded right coronary left to right collaterals, EF 50% 6. Moderate aortic stenosis. 7. Recent pneumonia 8. Hypertension 9. Dyslipidemia 10. Recent fall with vertebral compression fracture  PLAN: 1.   Mr. Comer denies any recurrent chest pain or worsening shortness of breath.  We have decreased his Lasix due to worsening creatinine.  He has a follow-up with his nephrologist today.  He reports having had blood work yesterday.  Those results are not available in care everywhere.  Will need to follow-up on that.  He is due for repeat lipid profile as he is on the [facility] [facility] system.  Provide a lab slip for that today.  No further adjustments on his medications.  We will need to follow his aortic stenosis closely.    Follow-up in 6 months.  Pixie Casino, MD, [facility], [place] Director of the Advanced Lipid Disorders &  Cardiovascular Risk Reduction Clinic Attending Cardiologist  Direct Dial: [phone]  Fax: [phone]  Website:  [email or web address] Mustin 11/30/2016, 10:10 AM

## 2016-12-01 ENCOUNTER — Ambulatory Visit (INDEPENDENT_AMBULATORY_CARE_PROVIDER_SITE_OTHER): Payer: Medicare Other | Admitting: Pharmacist

## 2016-12-01 ENCOUNTER — Other Ambulatory Visit: Payer: Medicare Other

## 2016-12-01 DIAGNOSIS — I482 Chronic atrial fibrillation, unspecified: Secondary | ICD-10-CM

## 2016-12-01 DIAGNOSIS — I82409 Acute embolism and thrombosis of unspecified deep veins of unspecified lower extremity: Secondary | ICD-10-CM

## 2016-12-01 DIAGNOSIS — Z7901 Long term (current) use of anticoagulants: Secondary | ICD-10-CM

## 2016-12-01 DIAGNOSIS — Z9889 Other specified postprocedural states: Secondary | ICD-10-CM

## 2016-12-01 LAB — LIPID PANEL
CHOLESTEROL TOTAL: 69 mg/dL — AB (ref 100–199)
Chol/HDL Ratio: 2.2 ratio (ref 0.0–5.0)
HDL: 32 mg/dL — ABNORMAL LOW (ref >39)
LDL Calculated: 24 mg/dL (ref 0–99)
TRIGLYCERIDES: 64 mg/dL (ref 0–149)
VLDL Cholesterol Cal: 13 mg/dL (ref 5–40)

## 2016-12-01 LAB — BASIC METABOLIC PANEL
BUN/Creatinine Ratio: 24 (ref 10–24)
BUN: 60 mg/dL — ABNORMAL HIGH (ref 8–27)
CO2: 20 mmol/L (ref 20–29)
Calcium: 9.1 mg/dL (ref 8.6–10.2)
Chloride: 104 mmol/L (ref 96–106)
Creatinine, Ser: 2.46 mg/dL — ABNORMAL HIGH (ref 0.76–1.27)
GFR calc Af Amer: 27 mL/min/{1.73_m2} — ABNORMAL LOW (ref >59)
GFR, EST NON AFRICAN AMERICAN: 23 mL/min/{1.73_m2} — AB (ref >59)
GLUCOSE: 121 mg/dL — AB (ref 65–99)
Potassium: 4.4 mmol/L (ref 3.5–5.2)
SODIUM: 142 mmol/L (ref 134–144)

## 2016-12-01 LAB — PROTIME-INR
INR: 5.4 — AB (ref 0.8–1.2)
PROTHROMBIN TIME: 57.7 s — AB (ref 9.1–12.0)

## 2016-12-01 NOTE — Telephone Encounter (Signed)
Renal function and electrolyte stable.

## 2016-12-06 ENCOUNTER — Other Ambulatory Visit: Payer: Self-pay | Admitting: Internal Medicine

## 2016-12-06 ENCOUNTER — Ambulatory Visit (INDEPENDENT_AMBULATORY_CARE_PROVIDER_SITE_OTHER): Payer: Medicare Other | Admitting: Pharmacist Clinician (PhC)/ Clinical Pharmacy Specialist

## 2016-12-06 DIAGNOSIS — I482 Chronic atrial fibrillation, unspecified: Secondary | ICD-10-CM

## 2016-12-06 DIAGNOSIS — Z7901 Long term (current) use of anticoagulants: Secondary | ICD-10-CM | POA: Diagnosis not present

## 2016-12-06 DIAGNOSIS — I82409 Acute embolism and thrombosis of unspecified deep veins of unspecified lower extremity: Secondary | ICD-10-CM

## 2016-12-06 DIAGNOSIS — Z9889 Other specified postprocedural states: Secondary | ICD-10-CM

## 2016-12-06 LAB — POCT INR: INR: 1.9

## 2016-12-13 ENCOUNTER — Telehealth: Payer: Self-pay | Admitting: Internal Medicine

## 2016-12-13 ENCOUNTER — Ambulatory Visit (INDEPENDENT_AMBULATORY_CARE_PROVIDER_SITE_OTHER): Payer: Medicare Other | Admitting: Pharmacist Clinician (PhC)/ Clinical Pharmacy Specialist

## 2016-12-13 DIAGNOSIS — Z9889 Other specified postprocedural states: Secondary | ICD-10-CM

## 2016-12-13 DIAGNOSIS — Z7901 Long term (current) use of anticoagulants: Secondary | ICD-10-CM

## 2016-12-13 DIAGNOSIS — I482 Chronic atrial fibrillation, unspecified: Secondary | ICD-10-CM

## 2016-12-13 DIAGNOSIS — I82409 Acute embolism and thrombosis of unspecified deep veins of unspecified lower extremity: Secondary | ICD-10-CM

## 2016-12-13 LAB — PROTIME-INR
INR: 7.5 — AB (ref 0.8–1.2)
Prothrombin Time: 79.5 s — ABNORMAL HIGH (ref 9.1–12.0)

## 2016-12-13 LAB — POCT INR: INR: >8

## 2016-12-13 NOTE — Telephone Encounter (Signed)
Incoming call from [facility]. Patient had a critical lab of INR 7.5 and PT as 79.5. Message and lab results have been given to pharmd.

## 2016-12-17 ENCOUNTER — Ambulatory Visit (INDEPENDENT_AMBULATORY_CARE_PROVIDER_SITE_OTHER): Payer: Medicare Other | Admitting: Pharmacist

## 2016-12-17 DIAGNOSIS — I482 Chronic atrial fibrillation, unspecified: Secondary | ICD-10-CM

## 2016-12-17 DIAGNOSIS — Z7901 Long term (current) use of anticoagulants: Secondary | ICD-10-CM | POA: Diagnosis not present

## 2016-12-17 DIAGNOSIS — I82409 Acute embolism and thrombosis of unspecified deep veins of unspecified lower extremity: Secondary | ICD-10-CM

## 2016-12-17 DIAGNOSIS — Z9889 Other specified postprocedural states: Secondary | ICD-10-CM

## 2016-12-17 LAB — POCT INR: INR: 5.7

## 2016-12-17 NOTE — Patient Instructions (Signed)
No warfarin until December/10, take 1/2 tablet on Monday. Repeat INR on Tuesday Dec/11  Patient aware to go to ER if any bleeding events this week.

## 2016-12-22 ENCOUNTER — Ambulatory Visit (INDEPENDENT_AMBULATORY_CARE_PROVIDER_SITE_OTHER): Payer: Medicare Other | Admitting: Pharmacist Clinician (PhC)/ Clinical Pharmacy Specialist

## 2016-12-22 DIAGNOSIS — I482 Chronic atrial fibrillation, unspecified: Secondary | ICD-10-CM

## 2016-12-22 DIAGNOSIS — I82409 Acute embolism and thrombosis of unspecified deep veins of unspecified lower extremity: Secondary | ICD-10-CM

## 2016-12-22 DIAGNOSIS — Z7901 Long term (current) use of anticoagulants: Secondary | ICD-10-CM

## 2016-12-22 DIAGNOSIS — Z9889 Other specified postprocedural states: Secondary | ICD-10-CM

## 2016-12-22 LAB — POCT INR: INR: 1.4

## 2016-12-28 ENCOUNTER — Ambulatory Visit (INDEPENDENT_AMBULATORY_CARE_PROVIDER_SITE_OTHER): Payer: Medicare Other | Admitting: Pharmacist Clinician (PhC)/ Clinical Pharmacy Specialist

## 2016-12-28 DIAGNOSIS — Z9889 Other specified postprocedural states: Secondary | ICD-10-CM

## 2016-12-28 DIAGNOSIS — Z7901 Long term (current) use of anticoagulants: Secondary | ICD-10-CM | POA: Diagnosis not present

## 2016-12-28 DIAGNOSIS — I82409 Acute embolism and thrombosis of unspecified deep veins of unspecified lower extremity: Secondary | ICD-10-CM

## 2016-12-28 DIAGNOSIS — I482 Chronic atrial fibrillation, unspecified: Secondary | ICD-10-CM

## 2016-12-28 LAB — POCT INR: INR: 6.1

## 2016-12-28 MED ORDER — DOCUSATE SODIUM 100 MG PO CAPS
100.00 mg | ORAL_CAPSULE | ORAL | Status: DC
Start: 2016-12-27 — End: 2016-12-28

## 2016-12-28 MED ORDER — GENERIC EXTERNAL MEDICATION
90.00 mg | Status: DC
Start: 2016-12-28 — End: 2016-12-28

## 2016-12-28 MED ORDER — BICALUTAMIDE 50 MG PO TABS
50.00 mg | ORAL_TABLET | ORAL | Status: DC
Start: 2016-12-28 — End: 2016-12-28

## 2016-12-28 MED ORDER — RANOLAZINE ER 500 MG PO TB12
500.00 mg | ORAL_TABLET | ORAL | Status: DC
Start: 2016-12-27 — End: 2016-12-28

## 2016-12-28 MED ORDER — PANTOPRAZOLE SODIUM 40 MG PO TBEC
40.00 mg | DELAYED_RELEASE_TABLET | ORAL | Status: DC
Start: 2016-12-28 — End: 2016-12-28

## 2016-12-28 MED ORDER — AZITHROMYCIN 250 MG PO TABS
500.00 mg | ORAL_TABLET | ORAL | Status: DC
Start: ? — End: 2016-12-28

## 2016-12-28 MED ORDER — ACETAMINOPHEN 500 MG PO TABS
500.00 mg | ORAL_TABLET | ORAL | Status: DC
Start: ? — End: 2016-12-28

## 2016-12-28 MED ORDER — VITAMIN C 500 MG PO TABS
500.00 mg | ORAL_TABLET | ORAL | Status: DC
Start: 2016-12-27 — End: 2016-12-28

## 2016-12-28 MED ORDER — IRON PO
1.00 | ORAL | Status: DC
Start: 2016-12-28 — End: 2016-12-28

## 2016-12-28 MED ORDER — ENZALUTAMIDE 40 MG PO CAPS
160.00 mg | ORAL_CAPSULE | ORAL | Status: DC
Start: 2016-12-27 — End: 2016-12-28

## 2016-12-28 MED ORDER — GENERIC EXTERNAL MEDICATION
1.00 g | Status: DC
Start: 2016-12-27 — End: 2016-12-28

## 2016-12-28 MED ORDER — CHOLECALCIFEROL 25 MCG (1000 UT) PO TABS
1000.00 | ORAL_TABLET | ORAL | Status: DC
Start: 2016-12-28 — End: 2016-12-28

## 2016-12-28 MED ORDER — ASPIRIN EC 81 MG PO TBEC
81.00 mg | DELAYED_RELEASE_TABLET | ORAL | Status: DC
Start: 2016-12-28 — End: 2016-12-28

## 2016-12-28 MED ORDER — GENERIC EXTERNAL MEDICATION
75.00 mg | Status: DC
Start: 2016-12-28 — End: 2016-12-28

## 2016-12-28 MED ORDER — DILTIAZEM HCL ER COATED BEADS 120 MG PO CP24
120.00 mg | ORAL_CAPSULE | ORAL | Status: DC
Start: 2016-12-28 — End: 2016-12-28

## 2016-12-28 MED ORDER — GABAPENTIN 300 MG PO CAPS
300.00 mg | ORAL_CAPSULE | ORAL | Status: DC
Start: 2016-12-27 — End: 2016-12-28

## 2016-12-28 MED ORDER — TAMSULOSIN HCL 0.4 MG PO CAPS
0.40 mg | ORAL_CAPSULE | ORAL | Status: DC
Start: 2016-12-28 — End: 2016-12-28

## 2016-12-28 NOTE — Patient Instructions (Addendum)
Description   No warfarin until INR check on Friday    Take furosemide 2 tablets twice daily Tuesday Dec 18 and Wednesday Dec 19.  The office will call you with a time to see Dr. Debara Pickett on Thursday.  If you feel worsening SOB or other problematic symptoms please go to ER

## 2016-12-30 ENCOUNTER — Telehealth: Payer: Self-pay | Admitting: Internal Medicine

## 2016-12-30 ENCOUNTER — Ambulatory Visit: Payer: Medicare Other | Admitting: Internal Medicine

## 2016-12-30 NOTE — Telephone Encounter (Signed)
[place] ) is calling because they are at the ER in [facility] w/Mr. Douthit because he has a vein in his left leg to ankle is cold and they think its a blockage and they are not letting leave . They are going to admit him today . If you have any questions please call

## 2016-12-30 NOTE — Telephone Encounter (Signed)
Routed to MD as FYI.

## 2017-01-12 ENCOUNTER — Encounter: Payer: Self-pay | Admitting: Internal Medicine

## 2017-01-12 ENCOUNTER — Other Ambulatory Visit: Payer: Self-pay | Admitting: Internal Medicine

## 2017-01-12 ENCOUNTER — Ambulatory Visit (INDEPENDENT_AMBULATORY_CARE_PROVIDER_SITE_OTHER): Payer: Medicare Other | Admitting: Pharmacist

## 2017-01-12 ENCOUNTER — Ambulatory Visit
Admission: RE | Admit: 2017-01-12 | Discharge: 2017-01-12 | Disposition: A | Discharge status: Home / Self Care | Payer: Medicare Other | Source: Ambulatory Visit | Attending: Internal Medicine | Admitting: Internal Medicine

## 2017-01-12 ENCOUNTER — Ambulatory Visit: Payer: Medicare Other | Admitting: Internal Medicine

## 2017-01-12 VITALS — BP 112/76 | HR 115 | Ht 69.0 in | Wt 178.6 lb

## 2017-01-12 DIAGNOSIS — I482 Chronic atrial fibrillation, unspecified: Secondary | ICD-10-CM

## 2017-01-12 DIAGNOSIS — Z9889 Other specified postprocedural states: Secondary | ICD-10-CM

## 2017-01-12 DIAGNOSIS — Z8701 Personal history of pneumonia (recurrent): Secondary | ICD-10-CM

## 2017-01-12 DIAGNOSIS — I350 Nonrheumatic aortic (valve) stenosis: Secondary | ICD-10-CM

## 2017-01-12 DIAGNOSIS — Z7901 Long term (current) use of anticoagulants: Secondary | ICD-10-CM

## 2017-01-12 DIAGNOSIS — I82409 Acute embolism and thrombosis of unspecified deep veins of unspecified lower extremity: Secondary | ICD-10-CM

## 2017-01-12 LAB — POCT INR: INR: 2.5

## 2017-01-12 MED ORDER — DILTIAZEM HCL ER COATED BEADS 180 MG PO CP24: 180.0000 mg | ORAL_CAPSULE | Freq: Every day | ORAL | 3 refills | Status: DC

## 2017-01-12 NOTE — Patient Instructions (Signed)
Your physician has recommended you make the following change in your medication:  -- STOP diltiazem 120mg   -- START diltiazem 180mg  daily (increased dose)  Your physician recommends that you schedule a follow-up appointment in [place] with Dr. Debara Pickett.

## 2017-01-12 NOTE — Progress Notes (Signed)
OFFICE NOTE  Chief Complaint:  Elevated heart rate  Primary Care Physician: Willey Blade, MD  HPI:  Greg Booker is an 84 year old gentleman followed by Dr. Rex Kras with history of coronary disease in the past. Cardiac catheterization in 2006 showed an occluded right coronary with collaterals, EF 50%. A stress test in 2010 most recently was negative for ischemia. He has had trouble with DVT and has been off Coumadin for periods of time. Recently he had hip surgery, which he underwent uneventfully and has done well and is back on his Coumadin. Unfortunately, his levels were too high; as of January 3 his INR was 6.9.  It now seems to be more regulated. He is describing no symptoms of bleeding, shortness of breath, palpitations, presyncope, syncopal symptoms, chest pain, or any associated cardiac findings.   Greg Booker returns today and is feeling quite well. Unfortunately a few weeks ago he developed a pneumonia and was treated for this. He is now almost completely recovered. Fortunately, his warfarin levels have remained therapeutic. His gotten back to his activities and is looking forward to going on a cruise for the next week.  I had the pleasure of seeing Greg Booker back today in follow-up. He was last seen in 2015 and underwent a stress test which is negative for ischemia as part of a preoperative workup for hip replacement. He ultimately underwent hip replacement is doing very well. He denies any chest pain or worsening shortness of breath. I reviewed his medicines today noted that he is in chronic/permanent atrial fibrillation. His INR is a been stable and followed by Erasmo Downer, our anticoagulation pharmacist. He has been on long-term digoxin which was started by his previous cardiologist. He does have stage III chronic kidney disease. Recently there is data from a couple of very good studies which indicate that digoxin may increase the risk of mortality and that there was a clear cut off of  digoxin levels greater than 1.2 which wasn't associated with increased mortality, especially in patients with known coronary artery disease. He has not had a digoxin level tested in some time - it would be recommended that he has this testing done at least every 6 months.  06/12/2015  Greg Booker was seen back today in follow-up. Recently he was seen by Ignacia Bayley, NP, for imbalance, dizziness and difficulty walking. He denied any true syncopal episodes. [name] ordered an echocardiogram and monitor. The monitor showed rate controlled A. fib without any significant pauses or tachyarrhythmias. The echocardiogram showed moderate aortic stenosis with normal LV function. Neither study indicated a clear etiology of his imbalance and dizziness. Concern is for his fall risk as he is on warfarin anticoagulation. Greg Booker does report he has a history of low back pain and neurosurgery.  01/21/2015  Greg Booker was seen today in follow-up. He recently was admitted at [facility] for acute limb ischemia of the left leg. 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 He ultimately had a peripheral angiogram and underwent left femoral-tibial bypass. He has had some difficulty in wound healing and is getting home health care. During this hospitalization he had no worsening chest pain or unstable angina. He had a recent stress test which showed no ischemia but a fixed inferior defect and was started on Ranexa for some exertional chest pain. This seems to be working well for him. His A. fib was somewhat uncontrolled during his hospitalization and the rate is somewhere around 100 today. He is reestablishing  on warfarin. He is not on an antiplatelet, but should be. Is also not had a recent lipid profile. In the past she's been intolerant to pravastatin, lovastatin and niacin causing myalgias and pruritus, respectively.  04/01/2016  Greg Booker returns today for follow-up. He is recently had some worsening shortness of  breath and lower extremity swelling. He had lower extremity arterial Dopplers this morning and I receive those preliminary reports indicating that his left arterial system is occluded. There is a significant reduction ABI of 0.57 on the right. He seems to have significant swelling on the right as well. He reports 2-3 pillow orthopnea which is new and shortness of breath with exertion. Weight is up to 194 from 189 pounds. EKG shows A. fib at 93 today with nonspecific ST and T-wave changes.   04/07/2016  Greg Booker returns today for follow-up. Since I last saw him last week he has had improvement in his breathing. He's not needed as many pillows to sleep at night. Weight has decreased from 194-191 pounds with his dry weight somewhere around 189 pounds. He is in A. fib with RVR today and was at a much faster rate yesterday when he had his echo performed. His echocardiogram did show a newly reduced EF to 45% with some diffuse hypokinesis. I suspect this is related to tachycardia related to A. fib. BNP was elevated over 700 with an elevated creatinine to 1.85. He is scheduled to see his nephrologist tomorrow. Hopefully that will repeat his creatinine at that time.  06/29/2016  Greg Booker is seen today in follow-up. Over the past several months she's done well. His weight is asked to down further to 181 from 191 pounds. He denies any worsening shortness of breath or swelling. Heart rate was slightly fast today with A. fib at 109. Blood pressure the right arm was 98/69 and checked in the left arm was 110/70. He does have some PAD and underwent left femoral-tibial bypass. He continues to report some discomfort in the left leg and is followed by Dr. Maryjean Morn in [facility]. Previously increased his metoprolol for rate control however there is little room to increase that further today. He says he is ambulating fairly well. He was in the ER in April the fall and had a compression fracture of the lumbar spine. He is apparently  seeing neurosurgery for this but has not undergone any procedures. His aortic murmur sounds stable compared to prior evaluation.  11/30/2016  Greg Booker returns today for follow-up.  Recently had an episode of chest pain.  He was seen by 1 of our physician assistants and he is nitrates were adjusted.  Subsequently he was short of breath and felt that this could be related to heart failure.  Repeat echo was ordered and his Lasix was increased.  This led to worsening of renal dysfunction and his Lasix was then decreased.  His echo shows in fact his heart function has normalized, with EF up to 55-60%, however he still has moderate aortic stenosis.  We discussed this at some length today and he understands that he may very well need to face aortic valve replacement at some point in the future.  I suspect this would be TAVR procedure as he has multiple comorbidities.  There would be a high risk of renal failure associated with that and possibly pushed him onto dialysis.  He is also on the [facility]rone[facility] system.  He has been using it for several months however we do not have recent  lab work to reassess his lipid profile.  He has been intolerant to statins.  01/12/2017  Greg Booker was seen today in follow-up.  He had a difficult couple of months.  He was admitted for pneumonia.  Subsequently he has had persistently elevated heart rate.  In addition he developed a cold foot and underwent repeat peripheral angiography and stenting.  He is now on aspirin, Plavix and warfarin.  INR was slightly supratherapeutic today.  He was started on the [place] Pushtronex system and has had marked improvement in his lipid profile with total cholesterol 69, triglycerides 64, HDL 32 and LDL-C 24.  He denies any anginal symptoms.  Weight is actually down 8 pounds since we last saw him.  EKG today shows A. fib with rapid ventricular response at 124.  His A. fib is permanent.  PMHx:  Past Medical History:  Diagnosis Date  .  Anticoagulated on Coumadin   . Arthritis   . Calcified granuloma of lung ([place]) RIGHT LUNG BASE - STABLE PER CXR 03-03-2010  . Chronic back pain   . CKD (chronic kidney disease), stage III ([place])   . Coronary artery disease    a. 1997 PCI/BMS to the RCA; b. 2006 Cath: Occluded RCA with collat flow, EF 60%, otw nonobs dzs; b. 2010 MV: No ischemia; c. 06/2013 MV: low risk w/o ischemia.  . Diabetes mellitus without complication ([place])   . Falls   . GERD (gastroesophageal reflux disease)   . Heart murmur   . History of DVT (deep vein thrombosis)    a. Early 2000's.  Marland Kitchen History of pneumonia 01/2013  . Hyperlipidemia   . Hypertensive heart disease   . Impaired hearing BILATERAL AIDS  . Low back pain radiating to left leg   . Mitral regurgitation   . Nocturia   . Numbness and tingling of left leg   . PAF (paroxysmal atrial fibrillation) ([facility])    a. CHA2DS2VASc = 5-->chronic coumadin.  . Prostate cancer ([place]) RECUR   a. 2006 S/P radioactive seed implant.  . Sciatic nerve pain LEFT LEG PAIN--  EPI INJECTIONS  . Urgency of urination     Past Surgical History:  Procedure Laterality Date  . BACK SURGERY    . CARDIAC CATHETERIZATION  1998   IN-STENT RESTENOSIS-- LARGE COLLATERALS  . CARDIAC CATHETERIZATION  2003  &  2006   2006 ---  MITRAL REGURG/ 100% OCCULSION RCA WITH LARGE COLLATERALS/ MILD DISEASE LAD & CIRCUMFLEX  . CARDIOVASCULAR STRESS TEST  06-03-2008   NORMAL STUDY/ LV NORMAL / COMPARED TO 12-26-2000 INFERIOR WALL ISCHEMIA IS NO LONGER PRESENT.  Marland Kitchen CARDIOVERSION  09-15-2005   UNSUCCESSFUL  . CORONARY ANGIOPLASTY WITH STENT PLACEMENT  1997- DR AL LITTLE   X1 STENT RCA  . CRYOABLATION  03/01/2011   Procedure: CRYO ABLATION PROSTATE;  Surgeon: Ailene Rud, MD;  [facility];  Service: Urology;  Laterality: N/A;  . EXCISION BENIGN BREAST MASS  08-06-2008   LEFT  . JOINT REPLACEMENT    . LAMINECTOMY  10-28-2008   L3 - 4  . LUMBAR  LAMINECTOMY/DECOMPRESSION MICRODISCECTOMY Right 04/05/2013   Procedure: Right Lumbar three-four Laminectomy ;  Surgeon: Charlie Pitter, MD;  Location: [place] [facility];  Service: Neurosurgery;  Laterality: Right;  . RADIOACTIVE SEED IMPLANT  08-03-2004   PROSTATE  . TOTAL HIP ARTHROPLASTY  11/02/2011   Procedure: TOTAL HIP ARTHROPLASTY;  Surgeon: Garald Balding, MD;  Location: [place];  Service: Orthopedics;  Laterality: Left;  Left Total Hip Replacement  . TOTAL HIP ARTHROPLASTY Right 06/26/2013   Procedure: TOTAL HIP ARTHROPLASTY;  Surgeon: Garald Balding, MD;  Location: [place];  Service: Orthopedics;  Laterality: Right;  . TRANSTHORACIC ECHOCARDIOGRAM  07-26-2007   LVSF NORMAL/ EF 55%/ LEFT ATRIUM 4.5CM MILD TO MOD. DILATED/ MILD  MITRAL & TRICUSPID REGURG    FAMHx:  Family History  Problem Relation Age of Onset  . Arthritis Mother   . Other Mother 89  . Heart Problems Father   . Heart disease Brother   . Breast cancer Sister   . Cancer Sister   . Heart Problems Brother     SOCHx:   reports that he quit smoking about 44 years ago. His smoking use included cigarettes and pipe. He has a 25.00 pack-year smoking history. he has never used smokeless tobacco. He reports that he does not drink alcohol or use drugs.  ALLERGIES:  Allergies  Allergen Reactions  . Bicalutamide Other (See Comments)    Casodex caused increase in breast size  . Codeine Itching and Rash  . Niacin And Related Other (See Comments)    Burning and stinging from tablets (otc capsules ok)  . Lovastatin Other (See Comments)    Leg weakness  . Beef Extract Rash  . Pravastatin Other (See Comments)    Myalgia    ROS: Pertinent items noted in HPI and remainder of comprehensive ROS otherwise negative.  HOME MEDS: Current Outpatient Medications  Medication Sig Dispense Refill  . aspirin EC 81 MG tablet Take 81 mg by mouth daily.     . B Complex Vitamins (VITAMIN B COMPLEX PO) Take 1 tablet by mouth daily.     .  bicalutamide (CASODEX) 50 MG tablet Take 50 mg by mouth daily.    . cephALEXin (KEFLEX) 500 MG capsule Take 500 mg by mouth 2 (two) times daily.    . Cholecalciferol (VITAMIN D3) 5000 units CAPS Take 5,000 Units by mouth daily.    Marland Kitchen docusate sodium (COLACE) 100 MG capsule Take 1 capsule (100 mg total) by mouth 2 (two) times daily as needed for mild constipation. 30 capsule 0  . Evolocumab with Infusor (Philip) 420 MG/3.5ML SOCT Inject 420 mg into the skin every 30 (thirty) days.    . furosemide (LASIX) 20 MG tablet Take 20 mg by mouth. 1/2 tablet (10mg )    . gabapentin (NEURONTIN) 100 MG capsule Take 300 mg by mouth at bedtime.     . isosorbide mononitrate (IMDUR) 60 MG 24 hr tablet TAKE 1 AND 1/2 TABLET BY MOUTH DAILY 45 tablet 5  . metoprolol succinate (TOPROL-XL) 100 MG 24 hr tablet Take 1 tablet (100 mg total) by mouth daily. Take with or immediately following a meal. 90 tablet 3  . Multiple Vitamins-Minerals (MULTIVITAMIN WITH MINERALS) tablet Take 1 tablet by mouth daily.    . nitroGLYCERIN (NITROSTAT) 0.4 MG SL tablet Place 1 tablet (0.4 mg total) under the tongue every 5 (five) minutes as needed for chest pain. 25 tablet PRN  . omeprazole (PRILOSEC) 20 MG capsule Take 20 mg by mouth daily.    Marland Kitchen oxyCODONE-acetaminophen (PERCOCET/ROXICET) 5-325 MG tablet Take 1-2 tablets by mouth every 6 (six) hours as needed for severe pain. 20 tablet 0  . ranolazine (RANEXA) 500 MG 12 hr tablet Take 1 tablet (500 mg total) by mouth 2 (two) times daily. 60 tablet 5  . Tamsulosin HCl (FLOMAX) 0.4 MG CAPS Take 0.4 mg by mouth daily after supper.    Marland Kitchen  vitamin C (ASCORBIC ACID) 500 MG tablet Take 500 mg by mouth daily.    Marland Kitchen warfarin (COUMADIN) 5 MG tablet TAKE ONE-HALF TO ONE TABLETS BY MOUTH ONCE DAILY AS DIRECTED BY COUMADIN CLINIC (Patient taking differently: Take 2.5 mg by mouth in the evening on Sun/Tues/Thurs/Sat and 5 mg on Mon/Wed/Fri) 90 tablet 0  . diltiazem (DILTIAZEM CD) 180 MG 24  hr capsule Take 1 capsule (180 mg total) by mouth daily. 90 capsule 3   No current facility-administered medications for this visit.     LABS/IMAGING: Results for orders placed or performed in visit on 01/12/17 (from the past 48 hour(s))  POCT INR     Status: None   Collection Time: 01/12/17  9:46 AM  Result Value Ref Range   INR 2.5    Dg Chest 2 View  Result Date: 01/12/2017 CLINICAL DATA:  Followup pneumonia with shortness of breath. EXAM: CHEST  2 VIEW COMPARISON:  12/25/2016 FINDINGS: Mild cardiomegaly as seen previously. Aortic atherosclerosis. Patchy infiltrate at the right lung base has improved. Multiple calcified granulomas in that region. Persistent abnormal density in the left lower lobe consistent with persistent pneumonia. Mass lesion not excluded. Continued follow-up. IMPRESSION: Improved appearance at the right lung base. Persistent abnormal density at the left base that could be pneumonia or mass. Continued follow-up to clearing recommended. Electronically Signed   By: Nelson Chimes M.D.   On: 01/12/2017 11:22    VITALS: BP 112/76   Pulse (!) 115   Ht 5\' 9"  (1.753 m)   Wt 178 lb 9.6 oz (81 kg)   BMI 26.37 kg/m   EXAM: General appearance: alert, appears older than stated age and no distress Neck: no carotid bruit, no JVD and thyroid not enlarged, symmetric, no tenderness/mass/nodules Lungs: clear to auscultation bilaterally Heart: irregularly irregular rhythm, S1, S2 normal and systolic murmur: late systolic 3/6, crescendo at 2nd right intercostal space Abdomen: soft, non-tender; bowel sounds normal; no masses,  no organomegaly Extremities: extremities normal, atraumatic, no cyanosis or edema and LLE notable for bypass, 1+ pulses Pulses: 2+ and symmetric Skin: Skin color, texture, turgor normal. No rashes or lesions Neurologic: Grossly normal Psych: Pleasant  EKG: A. fib with rapid ventricular response 124, inferior ST and T wave abnormalities-personally  reviewed  ASSESSMENT: 1. Chronic systolic congestive heart failure - LVEF decreased to 45% (global HK) 03/2016 2. PAD with recent left critical limb ischemia-status post 3 digit amputation and left femoral-tibial bypass (Dr. [facility]jean Morn at [facility]) 3. Dizziness, imbalance, frequent falls 4. Permanent atrial fibrillation - CHADSVASC score of 5 (anticoagulated on warfarin) 5. Coronary artery disease with occluded right coronary left to right collaterals, EF 50% 6. Moderate aortic stenosis. 7. Recent pneumonia 8. Hypertension 9. Dyslipidemia 10. Recent fall with vertebral compression fracture  PLAN: 1.   Greg Booker recently has had marked increase ventricular response related to A. fib.  This is most notably after he was hospitalized for pneumonia.  Blood pressure appears well-controlled today however heart rate is still elevated.  He would benefit from additional medication to lower heart rate and I advise increasing his diltiazem from 120 mg to 180 mg daily.  He will continue the current dose of metoprolol.    Follow-up in 1 month.  Pixie Casino, MD, [facility], [place] Director of the [facility]  Card[facility] Attending Cardiologist  Direct Dial: [phone]  Fax: [phone]  Website:  [email or web address]  Pixie Casino 01/12/2017,  5:38 PM

## 2017-01-20 ENCOUNTER — Ambulatory Visit: Payer: Medicare Other | Admitting: Internal Medicine

## 2017-01-21 ENCOUNTER — Ambulatory Visit (INDEPENDENT_AMBULATORY_CARE_PROVIDER_SITE_OTHER): Payer: Medicare Other

## 2017-01-21 ENCOUNTER — Ambulatory Visit (INDEPENDENT_AMBULATORY_CARE_PROVIDER_SITE_OTHER): Payer: Medicare Other | Admitting: Pharmacist

## 2017-01-21 VITALS — BP 110/70 | HR 90 | Wt 178.0 lb

## 2017-01-21 DIAGNOSIS — R0602 Shortness of breath: Secondary | ICD-10-CM

## 2017-01-21 DIAGNOSIS — I5043 Acute on chronic combined systolic (congestive) and diastolic (congestive) heart failure: Secondary | ICD-10-CM

## 2017-01-21 DIAGNOSIS — Z7901 Long term (current) use of anticoagulants: Secondary | ICD-10-CM | POA: Diagnosis not present

## 2017-01-21 DIAGNOSIS — I482 Chronic atrial fibrillation, unspecified: Secondary | ICD-10-CM

## 2017-01-21 DIAGNOSIS — I509 Heart failure, unspecified: Secondary | ICD-10-CM

## 2017-01-21 DIAGNOSIS — I82409 Acute embolism and thrombosis of unspecified deep veins of unspecified lower extremity: Secondary | ICD-10-CM

## 2017-01-21 DIAGNOSIS — Z9889 Other specified postprocedural states: Secondary | ICD-10-CM

## 2017-01-21 LAB — POCT INR: INR: 2.3

## 2017-01-21 NOTE — Patient Instructions (Signed)
1.) Reason for visit: SOB  2.) Name of MD requesting visit: Dr.Berry  3.) H&P: Patient had a INR appointment this morning.Pharmacist requested patient be triaged for sob.  4.) ROS related to problem: Patient stated sob has been worse the past 2 days.Stated Lasix was decreased to 10 mg twice a day at last visit with Dr.Hilty.  5.) Assessment and plan per MD: Spoke to DOD Dr.Berry he advised to increase Lasix to 20 mg twice a day .Advised to schedule appointment with a extender next week.Appointment scheduled with Jory Sims DNP 01/26/17 at 2:00 pm.

## 2017-01-25 ENCOUNTER — Other Ambulatory Visit: Payer: Self-pay | Admitting: Internal Medicine

## 2017-01-25 DIAGNOSIS — R918 Other nonspecific abnormal finding of lung field: Secondary | ICD-10-CM

## 2017-01-26 ENCOUNTER — Ambulatory Visit: Payer: Medicare Other | Admitting: Adult Health

## 2017-01-27 ENCOUNTER — Encounter: Payer: Self-pay | Admitting: Adult Health

## 2017-01-27 ENCOUNTER — Ambulatory Visit (INDEPENDENT_AMBULATORY_CARE_PROVIDER_SITE_OTHER): Payer: Medicare Other | Admitting: Pharmacist Clinician (PhC)/ Clinical Pharmacy Specialist

## 2017-01-27 ENCOUNTER — Ambulatory Visit: Payer: Medicare Other | Admitting: Adult Health

## 2017-01-27 VITALS — BP 107/72 | HR 108 | Ht 69.0 in | Wt 181.6 lb

## 2017-01-27 DIAGNOSIS — Z9889 Other specified postprocedural states: Secondary | ICD-10-CM | POA: Diagnosis not present

## 2017-01-27 DIAGNOSIS — I482 Chronic atrial fibrillation, unspecified: Secondary | ICD-10-CM

## 2017-01-27 DIAGNOSIS — I5032 Chronic diastolic (congestive) heart failure: Secondary | ICD-10-CM | POA: Diagnosis not present

## 2017-01-27 DIAGNOSIS — Z7901 Long term (current) use of anticoagulants: Secondary | ICD-10-CM

## 2017-01-27 DIAGNOSIS — I4821 Permanent atrial fibrillation: Secondary | ICD-10-CM

## 2017-01-27 DIAGNOSIS — I82409 Acute embolism and thrombosis of unspecified deep veins of unspecified lower extremity: Secondary | ICD-10-CM

## 2017-01-27 LAB — POCT INR: INR: 2.3

## 2017-01-27 NOTE — Patient Instructions (Signed)
Description   Take only 1/2 tablet daily until next appointment.  Start levofloxacin tonight.

## 2017-01-27 NOTE — Progress Notes (Signed)
Cardiology Office Note   Date:  1/61/0960   ID:  Muneeb, Veras [birth year], MRN [id number]  PCP:  Willey Blade, MD   Cardiologist: Dr. Debara Pickett   Chief Complaint  Patient presents with  . Shortness of Breath    pt states some SOB when walking   . Atrial Fibrillation   History of Present Illness: Greg Booker is a 84 y.o. male who presents for ongoing assessment and management of coronary artery disease, atrial fibrillation, also peripheral arterial disease with stenting, and remains on aspirin, Plavix, and warfarin, he was recently hospitalized for pneumonia, at which time he was found to have rapid atrial fibrillation in response to his physiologic stress.  On last office visit with Dr. Debara Pickett, dated 01/12/2017, his diltiazem was increased from 120 mg to 180 mg daily, and he was to continue on metoprolol as directed.  He continues some shortness of breath and has had recent chest x-ray at [facility] for ongoing management This revealed LLL pneumonia in the base and is planned for another course of antibiotics, Levaquin 500 mg. He is on coumadin and will need close follow up concerning the dosing on antibiotics.   Past Medical History:  Diagnosis Date  . Anticoagulated on Coumadin   . Arthritis   . Calcified granuloma of lung ([place]) RIGHT LUNG BASE - STABLE PER CXR 03-03-2010  . Chronic back pain   . CKD (chronic kidney disease), stage III ([place])   . Coronary artery disease    a. 1997 PCI/BMS to the RCA; b. 2006 Cath: Occluded RCA with collat flow, EF 60%, otw nonobs dzs; b. 2010 MV: No ischemia; c. 06/2013 MV: low risk w/o ischemia.  . Diabetes mellitus without complication ([place])   . Falls   . GERD (gastroesophageal reflux disease)   . Heart murmur   . History of DVT (deep vein thrombosis)    a. Early 2000's.  Marland Kitchen History of pneumonia 01/2013  . Hyperlipidemia   . Hypertensive heart disease   . Impaired hearing BILATERAL AIDS  . Low back pain radiating to left leg   .  Mitral regurgitation   . Nocturia   . Numbness and tingling of left leg   . PAF (paroxysmal atrial fibrillation) ([facility])    a. CHA2DS2VASc = 5-->chronic coumadin.  . Prostate cancer ([place]) RECUR   a. 2006 S/P radioactive seed implant.  . Sciatic nerve pain LEFT LEG PAIN--  EPI INJECTIONS  . Urgency of urination     Past Surgical History:  Procedure Laterality Date  . BACK SURGERY    . CARDIAC CATHETERIZATION  1998   IN-STENT RESTENOSIS-- LARGE COLLATERALS  . CARDIAC CATHETERIZATION  2003  &  2006   2006 ---  MITRAL REGURG/ 100% OCCULSION RCA WITH LARGE COLLATERALS/ MILD DISEASE LAD & CIRCUMFLEX  . CARDIOVASCULAR STRESS TEST  06-03-2008   NORMAL STUDY/ LV NORMAL / COMPARED TO 12-26-2000 INFERIOR WALL ISCHEMIA IS NO LONGER PRESENT.  Marland Kitchen CARDIOVERSION  09-15-2005   UNSUCCESSFUL  . CORONARY ANGIOPLASTY WITH STENT PLACEMENT  1997- DR AL LITTLE   X1 STENT RCA  . CRYOABLATION  03/01/2011   Procedure: CRYO ABLATION PROSTATE;  Surgeon: Ailene Rud, MD;  Location: [facility];  Service: Urology;  Laterality: N/A;  . EXCISION BENIGN BREAST MASS  08-06-2008   LEFT  . JOINT REPLACEMENT    . LAMINECTOMY  10-28-2008   L3 - 4  . LUMBAR LAMINECTOMY/DECOMPRESSION MICRODISCECTOMY Right 04/05/2013   Procedure: Right Lumbar three-four Laminectomy ;  Surgeon: Charlie Pitter, MD;  Location: [place] [facility];  Service: Neurosurgery;  Laterality: Right;  . RADIOACTIVE SEED IMPLANT  08-03-2004   PROSTATE  . TOTAL HIP ARTHROPLASTY  11/02/2011   Procedure: TOTAL HIP ARTHROPLASTY;  Surgeon: Garald Balding, MD;  Location: [place];  Service: Orthopedics;  Laterality: Left;  Left Total Hip Replacement  . TOTAL HIP ARTHROPLASTY Right 06/26/2013   Procedure: TOTAL HIP ARTHROPLASTY;  Surgeon: Garald Balding, MD;  Location: [place];  Service: Orthopedics;  Laterality: Right;  . TRANSTHORACIC ECHOCARDIOGRAM  07-26-2007   LVSF NORMAL/ EF 55%/ LEFT ATRIUM 4.5CM MILD TO MOD. DILATED/ MILD  MITRAL &  TRICUSPID REGURG     Current Outpatient Medications  Medication Sig Dispense Refill  . aspirin EC 81 MG tablet Take 81 mg by mouth daily.     . B Complex Vitamins (VITAMIN B COMPLEX PO) Take 1 tablet by mouth daily.     . bicalutamide (CASODEX) 50 MG tablet Take 50 mg by mouth daily.    . cephALEXin (KEFLEX) 500 MG capsule Take 500 mg by mouth 2 (two) times daily.    . Cholecalciferol (VITAMIN D3) 5000 units CAPS Take 5,000 Units by mouth daily.    Marland Kitchen diltiazem (DILTIAZEM CD) 180 MG 24 hr capsule Take 1 capsule (180 mg total) by mouth daily. 90 capsule 3  . docusate sodium (COLACE) 100 MG capsule Take 1 capsule (100 mg total) by mouth 2 (two) times daily as needed for mild constipation. 30 capsule 0  . Evolocumab with Infusor ([place]) 420 MG/3.5ML SOCT Inject 420 mg into the skin every 30 (thirty) days.    . furosemide (LASIX) 20 MG tablet Take 1 tablet (20 mg total) by mouth 2 (two) times daily. 60 tablet 6  . gabapentin (NEURONTIN) 100 MG capsule Take 300 mg by mouth at bedtime.     . isosorbide mononitrate (IMDUR) 60 MG 24 hr tablet TAKE 1 AND 1/2 TABLET BY MOUTH DAILY 45 tablet 5  . levofloxacin (LEVAQUIN) 500 MG tablet Take 1 tablet by mouth every other day.    . metoprolol succinate (TOPROL-XL) 100 MG 24 hr tablet Take 1 tablet (100 mg total) by mouth daily. Take with or immediately following a meal. 90 tablet 3  . Multiple Vitamins-Minerals (MULTIVITAMIN WITH MINERALS) tablet Take 1 tablet by mouth daily.    . nitroGLYCERIN (NITROSTAT) 0.4 MG SL tablet Place 1 tablet (0.4 mg total) under the tongue every 5 (five) minutes as needed for chest pain. 25 tablet PRN  . omeprazole (PRILOSEC) 20 MG capsule Take 20 mg by mouth daily.    Marland Kitchen oxyCODONE-acetaminophen (PERCOCET/ROXICET) 5-325 MG tablet Take 1-2 tablets by mouth every 6 (six) hours as needed for severe pain. 20 tablet 0  . ranolazine (RANEXA) 500 MG 12 hr tablet Take 1 tablet (500 mg total) by mouth 2 (two) times  daily. 60 tablet 5  . Tamsulosin HCl (FLOMAX) 0.4 MG CAPS Take 0.4 mg by mouth daily after supper.    . vitamin C (ASCORBIC ACID) 500 MG tablet Take 500 mg by mouth daily.    Marland Kitchen warfarin (COUMADIN) 5 MG tablet TAKE ONE-HALF TO ONE TABLETS BY MOUTH ONCE DAILY AS DIRECTED BY COUMADIN CLINIC (Patient taking differently: Take 2.5 mg by mouth in the evening on Sun/Tues/Thurs/Sat and 5 mg on Mon/Wed/Fri) 90 tablet 0   No current facility-administered medications for this visit.     Allergies:   Bicalutamide; Codeine; Niacin and related; Lovastatin; Beef extract; and  Pravastatin    Social History:  The patient  reports that he quit smoking about 44 years ago. His smoking use included cigarettes and pipe. He has a 25.00 pack-year smoking history. he has never used smokeless tobacco. He reports that he does not drink alcohol or use drugs.   Family History:  The patient's family history includes Arthritis in his mother; Breast cancer in his sister; Cancer in his sister; Heart Problems in his brother and father; Heart disease in his brother; Other (age of onset: 81) in his mother.    ROS: All other systems are reviewed and negative. Unless otherwise mentioned in H&P    PHYSICAL EXAM: VS:  BP 107/72   Pulse (!) 108   Ht 5\' 9"  (1.753 m)   Wt 181 lb 9.6 oz (82.4 kg)   SpO2 100%   BMI 26.82 kg/m  , BMI Body mass index is 26.82 kg/m. GEN: Well nourished, well developed, in no acute distress  HEENT: normal  Neck: no JVD, carotid bruits, or masses Cardiac: RRR; no murmurs, rubs, or gallops,no edema  Respiratory:  Clear to auscultation bilaterally, diminished breath sounds in the bases bilaterally.  GI: soft, nontender, nondistended, + BS MS: no deformity or atrophy He is frail and uses the walker for ambulation.  Skin: warm and dry, no rash. Thin, pale.  Neuro:  Strength and sensation are intact Psych: euthymic mood, full affect   Recent Labs: 04/01/2016: Brain Natriuretic Peptide  772.5 05/07/2016: Hemoglobin 10.4; Platelets 259 12/01/2016: BUN 60; Creatinine, Ser 2.46; Potassium 4.4; Sodium 142    Lipid Panel    Component Value Date/Time   CHOL 69 (L) 12/01/2016 1025   TRIG 64 12/01/2016 1025   HDL 32 (L) 12/01/2016 1025   CHOLHDL 2.2 12/01/2016 1025   LDLCALC 24 12/01/2016 1025      Wt Readings from Last 3 Encounters:  01/27/17 181 lb 9.6 oz (82.4 kg)  01/21/17 178 lb (80.7 kg)  01/12/17 178 lb 9.6 oz (81 kg)      Other studies Reviewed: Echocardiogram 11-15-2016  Left ventricle: The cavity size was normal. Wall thickness was   increased in a pattern of moderate LVH. Systolic function was   normal. The estimated ejection fraction was in the range of 55%   to 60%. Wall motion was normal; there were no regional wall   motion abnormalities. The study is not technically sufficient to   allow evaluation of LV diastolic function. - Aortic valve: Calcified leaflets. Moderate stenosis. There was   trivial regurgitation. Mean gradient (S): 20 mm Hg. Peak gradient   (S): 37 mm Hg. Valve area (VTI): 0.92 cm^2. Valve area (Vmax):   0.93 cm^2. Valve area (Vmean): 0.92 cm^2. - Aorta: Ascending aortic diameter: 38 mm (S). - Aortic root: The aortic root was top normal in size. - Mitral valve: Calcified annulus. Mildly thickened leaflets .   There was mild to moderate regurgitation. - Left atrium: Severely dilated. - Right atrium: Moderately dilated. - Tricuspid valve: There was mild regurgitation. - Pulmonary arteries: PA peak pressure: 35 mm Hg (S). - Inferior vena cava: The vessel was normal in size. The   respirophasic diameter changes were in the normal range (>= 50%),   consistent with normal central venous pressure.  ASSESSMENT AND PLAN:  1.  CAD:  Cardiac catheterization in 2006 showed an occluded right coronary with collaterals, EF 50%. A stress test in 2010 most recently was negative for ischemia. No changes in his current regimen.  2. PAD:  Stenting with left fem-pop bypass by Dr. Maryjean Morn. Now on Plavix and ASA.   3. Atrial fib: Heart rate elevated during recent hospitalization for pneumonia. He remains on diltiazem with higher dose of 180 mg from 120 mg. BP is stable. HR is slightly elevated today. Coumadin will be adjusted by our Pharm D during treatment of pneumonia on Levaquin.   4. Pneumonia: Followed by Jeanmarie Plant.    Current medicines are reviewed at length with the patient today.    Labs/ tests ordered today include: INR per coumadin,   Phill Myron. West Pugh, ANP, AACC   01/27/2017 1:36 PM    [facility] [address] [place], [place] [address] Phone: [phone]; Fax: [phone]

## 2017-01-27 NOTE — Patient Instructions (Signed)
Make sure to keep scheduled follow-ups with coumadin and Dr Debara Pickett

## 2017-02-01 ENCOUNTER — Ambulatory Visit (INDEPENDENT_AMBULATORY_CARE_PROVIDER_SITE_OTHER): Payer: Medicare Other | Admitting: Pharmacist

## 2017-02-01 DIAGNOSIS — Z9889 Other specified postprocedural states: Secondary | ICD-10-CM

## 2017-02-01 DIAGNOSIS — I482 Chronic atrial fibrillation, unspecified: Secondary | ICD-10-CM

## 2017-02-01 DIAGNOSIS — Z7901 Long term (current) use of anticoagulants: Secondary | ICD-10-CM | POA: Diagnosis not present

## 2017-02-01 DIAGNOSIS — I82409 Acute embolism and thrombosis of unspecified deep veins of unspecified lower extremity: Secondary | ICD-10-CM | POA: Diagnosis not present

## 2017-02-01 LAB — POCT INR: INR: 1.8

## 2017-02-04 ENCOUNTER — Ambulatory Visit (INDEPENDENT_AMBULATORY_CARE_PROVIDER_SITE_OTHER): Payer: Medicare Other | Admitting: Pharmacist

## 2017-02-04 DIAGNOSIS — Z7901 Long term (current) use of anticoagulants: Secondary | ICD-10-CM | POA: Diagnosis not present

## 2017-02-04 DIAGNOSIS — I482 Chronic atrial fibrillation, unspecified: Secondary | ICD-10-CM

## 2017-02-04 DIAGNOSIS — Z9889 Other specified postprocedural states: Secondary | ICD-10-CM

## 2017-02-04 DIAGNOSIS — I82409 Acute embolism and thrombosis of unspecified deep veins of unspecified lower extremity: Secondary | ICD-10-CM

## 2017-02-04 LAB — POCT INR: INR: 2.5

## 2017-02-11 HISTORY — PX: LEG AMPUTATION ABOVE KNEE: SHX117

## 2017-02-22 ENCOUNTER — Ambulatory Visit: Payer: Medicare Other | Admitting: Internal Medicine

## 2017-02-22 ENCOUNTER — Ambulatory Visit (INDEPENDENT_AMBULATORY_CARE_PROVIDER_SITE_OTHER): Payer: Medicare Other | Admitting: Pharmacist

## 2017-02-22 ENCOUNTER — Encounter: Payer: Self-pay | Admitting: Internal Medicine

## 2017-02-22 VITALS — BP 97/75 | HR 95 | Ht 69.0 in | Wt 177.8 lb

## 2017-02-22 DIAGNOSIS — I5032 Chronic diastolic (congestive) heart failure: Secondary | ICD-10-CM | POA: Diagnosis not present

## 2017-02-22 DIAGNOSIS — I482 Chronic atrial fibrillation, unspecified: Secondary | ICD-10-CM

## 2017-02-22 DIAGNOSIS — I350 Nonrheumatic aortic (valve) stenosis: Secondary | ICD-10-CM | POA: Diagnosis not present

## 2017-02-22 DIAGNOSIS — Z9889 Other specified postprocedural states: Secondary | ICD-10-CM

## 2017-02-22 DIAGNOSIS — Z7901 Long term (current) use of anticoagulants: Secondary | ICD-10-CM | POA: Diagnosis not present

## 2017-02-22 DIAGNOSIS — I4821 Permanent atrial fibrillation: Secondary | ICD-10-CM | POA: Insufficient documentation

## 2017-02-22 DIAGNOSIS — I82409 Acute embolism and thrombosis of unspecified deep veins of unspecified lower extremity: Secondary | ICD-10-CM

## 2017-02-22 LAB — POCT INR: INR: 2

## 2017-02-22 NOTE — Progress Notes (Signed)
OFFICE NOTE  Chief Complaint:  Elevated heart rate  Primary Care Physician: Willey Blade, MD  HPI:  Greg Booker is an 84 year old gentleman followed by Dr. Rex Kras with history of coronary disease in the past. Cardiac catheterization in 2006 showed an occluded right coronary with collaterals, EF 50%. A stress test in 2010 most recently was negative for ischemia. He has had trouble with DVT and has been off Coumadin for periods of time. Recently he had hip surgery, which he underwent uneventfully and has done well and is back on his Coumadin. Unfortunately, his levels were too high; as of January 3 his INR was 6.9.  It now seems to be more regulated. He is describing no symptoms of bleeding, shortness of breath, palpitations, presyncope, syncopal symptoms, chest pain, or any associated cardiac findings.   Greg Booker returns today and is feeling quite well. Unfortunately a few weeks ago he developed a pneumonia and was treated for this. He is now almost completely recovered. Fortunately, his warfarin levels have remained therapeutic. His gotten back to his activities and is looking forward to going on a cruise for the next week.  I had the pleasure of seeing Greg Booker back today in follow-up. He was last seen in 2015 and underwent a stress test which is negative for ischemia as part of a preoperative workup for hip replacement. He ultimately underwent hip replacement is doing very well. He denies any chest pain or worsening shortness of breath. I reviewed his medicines today noted that he is in chronic/permanent atrial fibrillation. His INR is a been stable and followed by Erasmo Downer, our anticoagulation pharmacist. He has been on long-term digoxin which was started by his previous cardiologist. He does have stage III chronic kidney disease. Recently there is data from a couple of very good studies which indicate that digoxin may increase the risk of mortality and that there was a clear cut off of  digoxin levels greater than 1.2 which wasn't associated with increased mortality, especially in patients with known coronary artery disease. He has not had a digoxin level tested in some time - it would be recommended that he has this testing done at least every 6 months.  06/12/2015  Greg Booker was seen back today in follow-up. Recently he was seen by Ignacia Bayley, NP, for imbalance, dizziness and difficulty walking. He denied any true syncopal episodes. [name] ordered an echocardiogram and monitor. The monitor showed rate controlled A. fib without any significant pauses or tachyarrhythmias. The echocardiogram showed moderate aortic stenosis with normal LV function. Neither study indicated a clear etiology of his imbalance and dizziness. Concern is for his fall risk as he is on warfarin anticoagulation. Greg Booker does report he has a history of low back pain and neurosurgery.  01/21/2015  Greg Booker was seen today in follow-up. He recently was admitted at [facility] regional for acute limb ischemia of the left leg. 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 He ultimately had a peripheral angiogram and underwent left femoral-tibial bypass. He has had some difficulty in wound healing and is getting home health care. During this hospitalization he had no worsening chest pain or unstable angina. He had a recent stress test which showed no ischemia but a fixed inferior defect and was started on Ranexa for some exertional chest pain. This seems to be working well for him. His A. fib was somewhat uncontrolled during his hospitalization and the rate is somewhere around 100 today. He is reestablishing  on warfarin. He is not on an antiplatelet, but should be. Is also not had a recent lipid profile. In the past she's been intolerant to pravastatin, lovastatin and niacin causing myalgias and pruritus, respectively.  04/01/2016  Greg Booker returns today for follow-up. He is recently had some worsening shortness of  breath and lower extremity swelling. He had lower extremity arterial Dopplers this morning and I receive those preliminary reports indicating that his left arterial system is occluded. There is a significant reduction ABI of 0.57 on the right. He seems to have significant swelling on the right as well. He reports 2-3 pillow orthopnea which is new and shortness of breath with exertion. Weight is up to 194 from 189 pounds. EKG shows A. fib at 93 today with nonspecific ST and T-wave changes.   04/07/2016  Greg Booker returns today for follow-up. Since I last saw him last week he has had improvement in his breathing. He's not needed as many pillows to sleep at night. Weight has decreased from 194-191 pounds with his dry weight somewhere around 189 pounds. He is in A. fib with RVR today and was at a much faster rate yesterday when he had his echo performed. His echocardiogram did show a newly reduced EF to 45% with some diffuse hypokinesis. I suspect this is related to tachycardia related to A. fib. BNP was elevated over 700 with an elevated creatinine to 1.85. He is scheduled to see his nephrologist tomorrow. Hopefully that will repeat his creatinine at that time.  06/29/2016  Greg Booker is seen today in follow-up. Over the past several months she's done well. His weight is asked to down further to 181 from 191 pounds. He denies any worsening shortness of breath or swelling. Heart rate was slightly fast today with A. fib at 109. Blood pressure the right arm was 98/69 and checked in the left arm was 110/70. He does have some PAD and underwent left femoral-tibial bypass. He continues to report some discomfort in the left leg and is followed by Dr. Maryjean Morn in [facility]. Previously increased his metoprolol for rate control however there is little room to increase that further today. He says he is ambulating fairly well. He was in the ER in April the fall and had a compression fracture of the lumbar spine. He is apparently  seeing neurosurgery for this but has not undergone any procedures. His aortic murmur sounds stable compared to prior evaluation.  11/30/2016  Greg Booker returns today for follow-up.  Recently had an episode of chest pain.  He was seen by 1 of our physician assistants and he is nitrates were adjusted.  Subsequently he was short of breath and felt that this could be related to heart failure.  Repeat echo was ordered and his Lasix was increased.  This led to worsening of renal dysfunction and his Lasix was then decreased.  His echo shows in fact his heart function has normalized, with EF up to 55-60%, however he still has moderate aortic stenosis.  We discussed this at some length today and he understands that he may very well need to face aortic valve replacement at some point in the future.  I suspect this would be TAVR procedure as he has multiple comorbidities.  There would be a high risk of renal failure associated with that and possibly pushed him onto dialysis.  He is also on the [facility] Pushtronex system.  He has been using it for several months however we do not have recent  lab work to reassess his lipid profile.  He has been intolerant to statins.  01/12/2017  Greg Booker was seen today in follow-up.  He had a difficult couple of months.  He was admitted for pneumonia.  Subsequently he has had persistently elevated heart rate.  In addition he developed a cold foot and underwent repeat peripheral angiography and stenting.  He is now on aspirin, Plavix and warfarin.  INR was slightly supratherapeutic today.  He was started on the [place] Pushtronex system and has had marked improvement in his lipid profile with total cholesterol 69, triglycerides 64, HDL 32 and LDL-C 24.  He denies any anginal symptoms.  Weight is actually down 8 pounds since we last saw him.  EKG today shows A. fib with rapid ventricular response at 124.  His A. fib is permanent.  02/22/2017  Greg Booker returns today for follow-up.  He  reports he is not had any chest pain in the last 5 or 6 weeks.  He was treated again for pneumonia which apparently has resolved.  His heart rate is better controlled today on increased dose diltiazem however his blood pressure has been running low.  He is on 90 mg of Imdur and Ranexa.  He is also on metoprolol.  He is due for an INR check today.  PMHx:  Past Medical History:  Diagnosis Date  . Anticoagulated on Coumadin   . Arthritis   . Calcified granuloma of lung ([place]) RIGHT LUNG BASE - STABLE PER CXR 03-03-2010  . Chronic back pain   . CKD (chronic kidney disease), stage III ([place])   . Coronary artery disease    a. 1997 PCI/BMS to the RCA; b. 2006 Cath: Occluded RCA with collat flow, EF 60%, otw nonobs dzs; b. 2010 MV: No ischemia; c. 06/2013 MV: low risk w/o ischemia.  . Diabetes mellitus without complication ([place])   . Falls   . GERD (gastroesophageal reflux disease)   . Heart murmur   . History of DVT (deep vein thrombosis)    a. Early 2000's.  Marland Kitchen History of pneumonia 01/2013  . Hyperlipidemia   . Hypertensive heart disease   . Impaired hearing BILATERAL AIDS  . Low back pain radiating to left leg   . Mitral regurgitation   . Nocturia   . Numbness and tingling of left leg   . PAF (paroxysmal atrial fibrillation) ([facility])    a. CHA2DS2VASc = 5-->chronic coumadin.  . Prostate cancer ([place]) RECUR   a. 2006 S/P radioactive seed implant.  . Sciatic nerve pain LEFT LEG PAIN--  EPI INJECTIONS  . Urgency of urination     Past Surgical History:  Procedure Laterality Date  . BACK SURGERY    . CARDIAC CATHETERIZATION  1998   IN-STENT RESTENOSIS-- LARGE COLLATERALS  . CARDIAC CATHETERIZATION  2003  &  2006   2006 ---  MITRAL REGURG/ 100% OCCULSION RCA WITH LARGE COLLATERALS/ MILD DISEASE LAD & CIRCUMFLEX  . CARDIOVASCULAR STRESS TEST  06-03-2008   NORMAL STUDY/ LV NORMAL / COMPARED TO 12-26-2000 INFERIOR WALL ISCHEMIA IS NO LONGER PRESENT.  Marland Kitchen CARDIOVERSION  09-15-2005   UNSUCCESSFUL  .  CORONARY ANGIOPLASTY WITH STENT PLACEMENT  1997- DR AL LITTLE   X1 STENT RCA  . CRYOABLATION  03/01/2011   Procedure: CRYO ABLATION PROSTATE;  Surgeon: Ailene Rud, MD;  Location: [facility];  Service: Urology;  Laterality: N/A;  . EXCISION BENIGN BREAST MASS  08-06-2008   LEFT  . JOINT REPLACEMENT    .  LAMINECTOMY  10-28-2008   L3 - 4  . LUMBAR LAMINECTOMY/DECOMPRESSION MICRODISCECTOMY Right 04/05/2013   Procedure: Right Lumbar three-four Laminectomy ;  Surgeon: Charlie Pitter, MD;  Location: [facility];  Service: Neurosurgery;  Laterality: Right;  . RADIOACTIVE SEED IMPLANT  08-03-2004   PROSTATE  . TOTAL HIP ARTHROPLASTY  11/02/2011   Procedure: TOTAL HIP ARTHROPLASTY;  Surgeon: Garald Balding, MD;  Location: [place];  Service: Orthopedics;  Laterality: Left;  Left Total Hip Replacement  . TOTAL HIP ARTHROPLASTY Right 06/26/2013   Procedure: TOTAL HIP ARTHROPLASTY;  Surgeon: Garald Balding, MD;  Location: [place];  Service: Orthopedics;  Laterality: Right;  . TRANSTHORACIC ECHOCARDIOGRAM  07-26-2007   LVSF NORMAL/ EF 55%/ LEFT ATRIUM 4.5CM MILD TO MOD. DILATED/ MILD  MITRAL & TRICUSPID REGURG    FAMHx:  Family History  Problem Relation Age of Onset  . Arthritis Mother   . Other Mother 53  . Heart Problems Father   . Heart disease Brother   . Breast cancer Sister   . Cancer Sister   . Heart Problems Brother     SOCHx:   reports that he quit smoking about 45 years ago. His smoking use included cigarettes and pipe. He has a 25.00 pack-year smoking history. he has never used smokeless tobacco. He reports that he does not drink alcohol or use drugs.  ALLERGIES:  Allergies  Allergen Reactions  . Bicalutamide Other (See Comments)    Casodex caused increase in breast size  . Codeine Itching and Rash  . Niacin And Related Other (See Comments)    Burning and stinging from tablets (otc capsules ok)  . Lovastatin Other (See Comments)    Leg weakness  .  Beef Extract Rash  . Pravastatin Other (See Comments)    Myalgia    ROS: Pertinent items noted in HPI and remainder of comprehensive ROS otherwise negative.  HOME MEDS: Current Outpatient Medications  Medication Sig Dispense Refill  . aspirin EC 81 MG tablet Take 81 mg by mouth daily.     . B Complex Vitamins (VITAMIN B COMPLEX PO) Take 1 tablet by mouth daily.     . bicalutamide (CASODEX) 50 MG tablet Take 50 mg by mouth daily.    . Cholecalciferol (VITAMIN D3) 5000 units CAPS Take 5,000 Units by mouth daily.    Marland Kitchen diltiazem (DILTIAZEM CD) 180 MG 24 hr capsule Take 1 capsule (180 mg total) by mouth daily. 90 capsule 3  . docusate sodium (COLACE) 100 MG capsule Take 1 capsule (100 mg total) by mouth 2 (two) times daily as needed for mild constipation. 30 capsule 0  . Evolocumab with Infusor ([place]) 420 MG/3.5ML SOCT Inject 420 mg into the skin every 30 (thirty) days.    . furosemide (LASIX) 20 MG tablet Take 1 tablet (20 mg total) by mouth 2 (two) times daily. 60 tablet 6  . gabapentin (NEURONTIN) 100 MG capsule Take 300 mg by mouth at bedtime.     . isosorbide mononitrate (IMDUR) 60 MG 24 hr tablet TAKE 1 AND 1/2 TABLET BY MOUTH DAILY 45 tablet 5  . metoprolol succinate (TOPROL-XL) 100 MG 24 hr tablet Take 1 tablet (100 mg total) by mouth daily. Take with or immediately following a meal. 90 tablet 3  . Multiple Vitamins-Minerals (MULTIVITAMIN WITH MINERALS) tablet Take 1 tablet by mouth daily.    . nitroGLYCERIN (NITROSTAT) 0.4 MG SL tablet Place 1 tablet (0.4 mg total) under the tongue every 5 (five)  minutes as needed for chest pain. 25 tablet PRN  . omeprazole (PRILOSEC) 20 MG capsule Take 20 mg by mouth daily.    Marland Kitchen oxyCODONE-acetaminophen (PERCOCET/ROXICET) 5-325 MG tablet Take 1-2 tablets by mouth every 6 (six) hours as needed for severe pain. 20 tablet 0  . ranolazine (RANEXA) 500 MG 12 hr tablet Take 1 tablet (500 mg total) by mouth 2 (two) times daily. 60 tablet 5   . Tamsulosin HCl (FLOMAX) 0.4 MG CAPS Take 0.4 mg by mouth daily after supper.    . vitamin C (ASCORBIC ACID) 500 MG tablet Take 500 mg by mouth daily.    Marland Kitchen warfarin (COUMADIN) 5 MG tablet TAKE ONE-HALF TO ONE TABLETS BY MOUTH ONCE DAILY AS DIRECTED BY COUMADIN CLINIC (Patient taking differently: Take 2.5 mg by mouth in the evening on Sun/Tues/Thurs/Sat and 5 mg on Mon/Wed/Fri) 90 tablet 0   No current facility-administered medications for this visit.     LABS/IMAGING: No results found for this or any previous visit (from the past 48 hour(s)). No results found.  VITALS: BP 97/75   Pulse 95   Ht 5\' 9"  (1.753 m)   Wt 177 lb 12.8 oz (80.6 kg)   SpO2 98%   BMI 26.26 kg/m   EXAM: General appearance: alert, appears older than stated age and no distress Neck: no carotid bruit, no JVD and thyroid not enlarged, symmetric, no tenderness/mass/nodules Lungs: clear to auscultation bilaterally Heart: irregularly irregular rhythm, S1, S2 normal and systolic murmur: late systolic 3/6, crescendo at 2nd right intercostal space Abdomen: soft, non-tender; bowel sounds normal; no masses,  no organomegaly Extremities: extremities normal, atraumatic, no cyanosis or edema and LLE notable for bypass, 1+ pulses Pulses: 2+ and symmetric Skin: Skin color, texture, turgor normal. No rashes or lesions Neurologic: Grossly normal Psych: Pleasant  EKG: Deferred  ASSESSMENT: 1. Chronic systolic congestive heart failure - LVEF decreased to 45% (global HK) 03/2016 2. PAD with recent left critical limb ischemia-status post 3 digit amputation and left femoral-tibial bypass (Dr. Maryjean Morn at [facility]) 3. Dizziness, imbalance, frequent falls 4. Permanent atrial fibrillation - CHADSVASC score of 5 (anticoagulated on warfarin) 5. Coronary artery disease with occluded right coronary left to right collaterals, EF 50% 6. Moderate aortic stenosis. 7. Recent pneumonia 8. Hypertension 9. Dyslipidemia 10. Recent fall with  vertebral compression fracture  PLAN: 1.   Mr. Graybeal has had better rate control on increased dose diltiazem.  His blood pressure is low normal.  We will decrease his M door to 60 mg daily.  Repeat INR today.  Plan follow-up with me in 6 months.  Pixie Casino, MD, [facility] [place] Director of the Advanced Lipid Disorders &  Cardiovascular Risk Reduction Clinic Attending Cardiologist  Direct Dial: [phone]  Fax: [phone]  Website:  [email or web address] 02/22/2017, 8:46 AM

## 2017-02-22 NOTE — Patient Instructions (Signed)
Your physician has recommended you make the following change in your medication:  DECREASE isosorbide mononitrate (imdur) to 60mg  daily  Your physician wants you to follow-up in: 6 months with Dr. Debara Pickett. You will receive a reminder letter in the mail two months in advance. If you don't receive a letter, please call our office to schedule the follow-up appointment.

## 2017-04-07 ENCOUNTER — Ambulatory Visit (INDEPENDENT_AMBULATORY_CARE_PROVIDER_SITE_OTHER): Payer: Medicare Other | Admitting: Pharmacist Clinician (PhC)/ Clinical Pharmacy Specialist

## 2017-04-07 DIAGNOSIS — Z7901 Long term (current) use of anticoagulants: Secondary | ICD-10-CM | POA: Diagnosis not present

## 2017-04-07 DIAGNOSIS — I482 Chronic atrial fibrillation, unspecified: Secondary | ICD-10-CM

## 2017-04-07 DIAGNOSIS — I82409 Acute embolism and thrombosis of unspecified deep veins of unspecified lower extremity: Secondary | ICD-10-CM

## 2017-04-07 DIAGNOSIS — Z9889 Other specified postprocedural states: Secondary | ICD-10-CM | POA: Diagnosis not present

## 2017-04-07 LAB — PROTIME-INR: INR: 4.6 — AB (ref 0.9–1.1)

## 2017-04-11 ENCOUNTER — Ambulatory Visit (INDEPENDENT_AMBULATORY_CARE_PROVIDER_SITE_OTHER): Payer: Medicare Other | Admitting: Pharmacist

## 2017-04-11 DIAGNOSIS — Z7901 Long term (current) use of anticoagulants: Secondary | ICD-10-CM

## 2017-04-11 DIAGNOSIS — I482 Chronic atrial fibrillation, unspecified: Secondary | ICD-10-CM

## 2017-04-11 DIAGNOSIS — Z9889 Other specified postprocedural states: Secondary | ICD-10-CM

## 2017-04-11 DIAGNOSIS — I82409 Acute embolism and thrombosis of unspecified deep veins of unspecified lower extremity: Secondary | ICD-10-CM

## 2017-04-11 LAB — POCT INR: INR: 1.8

## 2017-04-18 ENCOUNTER — Ambulatory Visit (INDEPENDENT_AMBULATORY_CARE_PROVIDER_SITE_OTHER): Payer: Medicare Other | Admitting: Pharmacist Clinician (PhC)/ Clinical Pharmacy Specialist

## 2017-04-18 DIAGNOSIS — Z7901 Long term (current) use of anticoagulants: Secondary | ICD-10-CM

## 2017-04-18 DIAGNOSIS — I82409 Acute embolism and thrombosis of unspecified deep veins of unspecified lower extremity: Secondary | ICD-10-CM | POA: Diagnosis not present

## 2017-04-18 DIAGNOSIS — Z9889 Other specified postprocedural states: Secondary | ICD-10-CM | POA: Diagnosis not present

## 2017-04-18 DIAGNOSIS — I482 Chronic atrial fibrillation, unspecified: Secondary | ICD-10-CM

## 2017-04-18 LAB — POCT INR: INR: 6.1

## 2017-04-18 NOTE — Patient Instructions (Signed)
Description   No warfarin Monday April 8 or Tuesday April 9.  Then decrease dose to 4 mg daily except 2 mg (1/2 tablet) each Monday, Wednesday and Friday.  Repeat INR in 1 week

## 2017-04-25 ENCOUNTER — Ambulatory Visit (INDEPENDENT_AMBULATORY_CARE_PROVIDER_SITE_OTHER): Payer: Medicare Other | Admitting: Pharmacist Clinician (PhC)/ Clinical Pharmacy Specialist

## 2017-04-25 DIAGNOSIS — Z9889 Other specified postprocedural states: Secondary | ICD-10-CM

## 2017-04-25 DIAGNOSIS — I82409 Acute embolism and thrombosis of unspecified deep veins of unspecified lower extremity: Secondary | ICD-10-CM

## 2017-04-25 DIAGNOSIS — I482 Chronic atrial fibrillation, unspecified: Secondary | ICD-10-CM

## 2017-04-25 DIAGNOSIS — Z7901 Long term (current) use of anticoagulants: Secondary | ICD-10-CM | POA: Diagnosis not present

## 2017-04-25 LAB — POCT INR: INR: 2.8

## 2017-05-03 ENCOUNTER — Ambulatory Visit (INDEPENDENT_AMBULATORY_CARE_PROVIDER_SITE_OTHER): Payer: Medicare Other | Admitting: Pharmacist

## 2017-05-03 DIAGNOSIS — I482 Chronic atrial fibrillation, unspecified: Secondary | ICD-10-CM

## 2017-05-03 DIAGNOSIS — Z7901 Long term (current) use of anticoagulants: Secondary | ICD-10-CM | POA: Diagnosis not present

## 2017-05-03 DIAGNOSIS — Z9889 Other specified postprocedural states: Secondary | ICD-10-CM

## 2017-05-03 DIAGNOSIS — I82409 Acute embolism and thrombosis of unspecified deep veins of unspecified lower extremity: Secondary | ICD-10-CM

## 2017-05-03 LAB — POCT INR: INR: 2.9

## 2017-05-06 DIAGNOSIS — N189 Chronic kidney disease, unspecified: Secondary | ICD-10-CM | POA: Diagnosis not present

## 2017-05-06 DIAGNOSIS — D631 Anemia in chronic kidney disease: Secondary | ICD-10-CM | POA: Diagnosis not present

## 2017-05-06 DIAGNOSIS — D472 Monoclonal gammopathy: Secondary | ICD-10-CM | POA: Diagnosis not present

## 2017-05-11 ENCOUNTER — Other Ambulatory Visit: Payer: Self-pay | Admitting: Cardiovascular Disease

## 2017-05-17 ENCOUNTER — Ambulatory Visit (INDEPENDENT_AMBULATORY_CARE_PROVIDER_SITE_OTHER): Payer: Medicare Other | Admitting: Pharmacist Clinician (PhC)/ Clinical Pharmacy Specialist

## 2017-05-17 DIAGNOSIS — Z9889 Other specified postprocedural states: Secondary | ICD-10-CM

## 2017-05-17 DIAGNOSIS — I482 Chronic atrial fibrillation, unspecified: Secondary | ICD-10-CM

## 2017-05-17 DIAGNOSIS — Z7901 Long term (current) use of anticoagulants: Secondary | ICD-10-CM | POA: Diagnosis not present

## 2017-05-17 DIAGNOSIS — I82409 Acute embolism and thrombosis of unspecified deep veins of unspecified lower extremity: Secondary | ICD-10-CM

## 2017-05-17 LAB — POCT INR: INR: 1.7

## 2017-05-17 NOTE — Patient Instructions (Signed)
Description   Take 1.5 tablets today Tuesday May 7.  Switch to 5 mg tablets when current supply gone.   Take 1 tablet (5 mg) each Monday and 1/2 tablet (2.5 mg ) all other days.  Repeat INR in 2 weeks

## 2017-05-20 DIAGNOSIS — R760 Raised antibody titer: Secondary | ICD-10-CM | POA: Diagnosis not present

## 2017-05-20 DIAGNOSIS — D472 Monoclonal gammopathy: Secondary | ICD-10-CM | POA: Diagnosis not present

## 2017-05-31 ENCOUNTER — Ambulatory Visit (INDEPENDENT_AMBULATORY_CARE_PROVIDER_SITE_OTHER): Payer: Medicare Other | Admitting: Pharmacist Clinician (PhC)/ Clinical Pharmacy Specialist

## 2017-05-31 DIAGNOSIS — I482 Chronic atrial fibrillation, unspecified: Secondary | ICD-10-CM

## 2017-05-31 DIAGNOSIS — Z7901 Long term (current) use of anticoagulants: Secondary | ICD-10-CM | POA: Diagnosis not present

## 2017-05-31 DIAGNOSIS — I82409 Acute embolism and thrombosis of unspecified deep veins of unspecified lower extremity: Secondary | ICD-10-CM

## 2017-05-31 DIAGNOSIS — Z9889 Other specified postprocedural states: Secondary | ICD-10-CM

## 2017-05-31 LAB — POCT INR: INR: 3.5 — AB (ref 2.0–3.0)

## 2017-05-31 NOTE — Patient Instructions (Signed)
Description   No warfarin today Tuesday May 21, then decrease dose to 1/2 tablet daily.  Repeat INR in 2 weeks

## 2017-06-14 ENCOUNTER — Ambulatory Visit (INDEPENDENT_AMBULATORY_CARE_PROVIDER_SITE_OTHER): Payer: Medicare Other | Admitting: Pharmacist

## 2017-06-14 DIAGNOSIS — I82409 Acute embolism and thrombosis of unspecified deep veins of unspecified lower extremity: Secondary | ICD-10-CM

## 2017-06-14 DIAGNOSIS — Z7901 Long term (current) use of anticoagulants: Secondary | ICD-10-CM

## 2017-06-14 DIAGNOSIS — I482 Chronic atrial fibrillation, unspecified: Secondary | ICD-10-CM

## 2017-06-14 DIAGNOSIS — Z9889 Other specified postprocedural states: Secondary | ICD-10-CM

## 2017-06-14 LAB — POCT INR: INR: 1.4 — AB (ref 2.0–3.0)

## 2017-06-24 ENCOUNTER — Ambulatory Visit (INDEPENDENT_AMBULATORY_CARE_PROVIDER_SITE_OTHER): Payer: Medicare Other | Admitting: Pharmacist Clinician (PhC)/ Clinical Pharmacy Specialist

## 2017-06-24 DIAGNOSIS — I482 Chronic atrial fibrillation, unspecified: Secondary | ICD-10-CM

## 2017-06-24 DIAGNOSIS — Z7901 Long term (current) use of anticoagulants: Secondary | ICD-10-CM

## 2017-06-24 DIAGNOSIS — I82409 Acute embolism and thrombosis of unspecified deep veins of unspecified lower extremity: Secondary | ICD-10-CM | POA: Diagnosis not present

## 2017-06-24 DIAGNOSIS — Z9889 Other specified postprocedural states: Secondary | ICD-10-CM

## 2017-06-24 LAB — POCT INR: INR: 2.4 (ref 2.0–3.0)

## 2017-06-24 NOTE — Patient Instructions (Signed)
Description   Continue with 1 tablet every Tuesday, and 1/2 tablet all other days of the week.  Repeat INR in 2 weeks

## 2017-07-08 ENCOUNTER — Ambulatory Visit (INDEPENDENT_AMBULATORY_CARE_PROVIDER_SITE_OTHER): Payer: Medicare Other | Admitting: Pharmacist Clinician (PhC)/ Clinical Pharmacy Specialist

## 2017-07-08 DIAGNOSIS — Z9889 Other specified postprocedural states: Secondary | ICD-10-CM

## 2017-07-08 DIAGNOSIS — I482 Chronic atrial fibrillation, unspecified: Secondary | ICD-10-CM

## 2017-07-08 DIAGNOSIS — Z7901 Long term (current) use of anticoagulants: Secondary | ICD-10-CM

## 2017-07-08 DIAGNOSIS — I82409 Acute embolism and thrombosis of unspecified deep veins of unspecified lower extremity: Secondary | ICD-10-CM

## 2017-07-08 LAB — POCT INR: INR: 4.1 — AB (ref 2.0–3.0)

## 2017-07-08 NOTE — Patient Instructions (Signed)
Description   No warfarin today Friday June 28, then continue with 1 tablet every Tuesday, and 1/2 tablet all other days of the week.  Repeat INR in 2 weeks

## 2017-07-25 ENCOUNTER — Ambulatory Visit (INDEPENDENT_AMBULATORY_CARE_PROVIDER_SITE_OTHER): Payer: Medicare Other | Admitting: Pharmacist

## 2017-07-25 DIAGNOSIS — Z7901 Long term (current) use of anticoagulants: Secondary | ICD-10-CM | POA: Diagnosis not present

## 2017-07-25 DIAGNOSIS — I482 Chronic atrial fibrillation, unspecified: Secondary | ICD-10-CM

## 2017-07-25 DIAGNOSIS — Z9889 Other specified postprocedural states: Secondary | ICD-10-CM

## 2017-07-25 DIAGNOSIS — I82409 Acute embolism and thrombosis of unspecified deep veins of unspecified lower extremity: Secondary | ICD-10-CM

## 2017-07-25 LAB — POCT INR: INR: 1.6 — AB (ref 2.0–3.0)

## 2017-08-05 ENCOUNTER — Ambulatory Visit (INDEPENDENT_AMBULATORY_CARE_PROVIDER_SITE_OTHER): Payer: Medicare Other | Admitting: Pharmacist Clinician (PhC)/ Clinical Pharmacy Specialist

## 2017-08-05 DIAGNOSIS — I82409 Acute embolism and thrombosis of unspecified deep veins of unspecified lower extremity: Secondary | ICD-10-CM

## 2017-08-05 DIAGNOSIS — Z7901 Long term (current) use of anticoagulants: Secondary | ICD-10-CM | POA: Diagnosis not present

## 2017-08-05 DIAGNOSIS — Z9889 Other specified postprocedural states: Secondary | ICD-10-CM

## 2017-08-05 DIAGNOSIS — I482 Chronic atrial fibrillation, unspecified: Secondary | ICD-10-CM

## 2017-08-05 LAB — POCT INR: INR: 3.4 — AB (ref 2.0–3.0)

## 2017-08-26 ENCOUNTER — Other Ambulatory Visit: Payer: Self-pay | Admitting: Internal Medicine

## 2017-08-26 ENCOUNTER — Ambulatory Visit (INDEPENDENT_AMBULATORY_CARE_PROVIDER_SITE_OTHER): Payer: Medicare Other | Admitting: Pharmacist

## 2017-08-26 DIAGNOSIS — Z9889 Other specified postprocedural states: Secondary | ICD-10-CM | POA: Diagnosis not present

## 2017-08-26 DIAGNOSIS — I82409 Acute embolism and thrombosis of unspecified deep veins of unspecified lower extremity: Secondary | ICD-10-CM

## 2017-08-26 DIAGNOSIS — I482 Chronic atrial fibrillation, unspecified: Secondary | ICD-10-CM

## 2017-08-26 DIAGNOSIS — Z7901 Long term (current) use of anticoagulants: Secondary | ICD-10-CM | POA: Diagnosis not present

## 2017-08-26 LAB — POCT INR: INR: 1.7 — AB (ref 2.0–3.0)

## 2017-08-26 NOTE — Patient Instructions (Signed)
Description   Today take 1 tablet then continue with 1 tablet every Tuesday, and 1/2 tablet all other days of the week.  Repeat INR in 3 weeks

## 2017-09-02 ENCOUNTER — Ambulatory Visit (INDEPENDENT_AMBULATORY_CARE_PROVIDER_SITE_OTHER): Payer: Medicare Other | Admitting: Pharmacist

## 2017-09-02 ENCOUNTER — Encounter: Payer: Self-pay | Admitting: Internal Medicine

## 2017-09-02 ENCOUNTER — Ambulatory Visit: Payer: Medicare Other | Admitting: Internal Medicine

## 2017-09-02 VITALS — BP 114/68 | HR 89 | Ht 69.0 in | Wt 175.0 lb

## 2017-09-02 DIAGNOSIS — I350 Nonrheumatic aortic (valve) stenosis: Secondary | ICD-10-CM | POA: Diagnosis not present

## 2017-09-02 DIAGNOSIS — I482 Chronic atrial fibrillation, unspecified: Secondary | ICD-10-CM

## 2017-09-02 DIAGNOSIS — I82409 Acute embolism and thrombosis of unspecified deep veins of unspecified lower extremity: Secondary | ICD-10-CM

## 2017-09-02 DIAGNOSIS — Z7901 Long term (current) use of anticoagulants: Secondary | ICD-10-CM | POA: Diagnosis not present

## 2017-09-02 DIAGNOSIS — Z9889 Other specified postprocedural states: Secondary | ICD-10-CM

## 2017-09-02 DIAGNOSIS — Z89512 Acquired absence of left leg below knee: Secondary | ICD-10-CM | POA: Insufficient documentation

## 2017-09-02 LAB — POCT INR: INR: 2.7 (ref 2.0–3.0)

## 2017-09-02 MED ORDER — RIVAROXABAN 15 MG PO TABS: 15.0000 mg | ORAL_TABLET | Freq: Every day | ORAL | 11 refills | Status: DC

## 2017-09-02 NOTE — Progress Notes (Signed)
OFFICE NOTE  Chief Complaint:  Exertional fatigue  Primary Care Physician: Raina Mina., MD  HPI:  Greg Booker is an 84 year old gentleman followed by Dr. Rex Kras with history of coronary disease in the past. Cardiac catheterization in 2006 showed an occluded right coronary with collaterals, EF 50%. A stress test in 2010 most recently was negative for ischemia. He has had trouble with DVT and has been off Coumadin for periods of time. Recently he had hip surgery, which he underwent uneventfully and has done well and is back on his Coumadin. Unfortunately, his levels were too high; as of January 3 his INR was 6.9.  It now seems to be more regulated. He is describing no symptoms of bleeding, shortness of breath, palpitations, presyncope, syncopal symptoms, chest pain, or any associated cardiac findings.   Greg Booker returns today and is feeling quite well. Unfortunately a few weeks ago he developed a pneumonia and was treated for this. He is now almost completely recovered. Fortunately, his warfarin levels have remained therapeutic. His gotten back to his activities and is looking forward to going on a cruise for the next week.  I had the pleasure of seeing Greg Booker back today in follow-up. He was last seen in 2015 and underwent a stress test which is negative for ischemia as part of a preoperative workup for hip replacement. He ultimately underwent hip replacement is doing very well. He denies any chest pain or worsening shortness of breath. I reviewed his medicines today noted that he is in chronic/permanent atrial fibrillation. His INR is a been stable and followed by Erasmo Downer, our anticoagulation pharmacist. He has been on long-term digoxin which was started by his previous cardiologist. He does have stage III chronic kidney disease. Recently there is data from a couple of very good studies which indicate that digoxin may increase the risk of mortality and that there was a clear cut off of  digoxin levels greater than 1.2 which wasn't associated with increased mortality, especially in patients with known coronary artery disease. He has not had a digoxin level tested in some time - it would be recommended that he has this testing done at least every 6 months.  06/12/2015  Greg Booker was seen back today in follow-up. Recently he was seen by Ignacia Bayley, NP, for imbalance, dizziness and difficulty walking. He denied any true syncopal episodes. [name] ordered an echocardiogram and monitor. The monitor showed rate controlled A. fib without any significant pauses or tachyarrhythmias. The echocardiogram showed moderate aortic stenosis with normal LV function. Neither study indicated a clear etiology of his imbalance and dizziness. Concern is for his fall risk as he is on warfarin anticoagulation. Greg Booker does report he has a history of low back pain and neurosurgery.  01/21/2015  Greg Booker was seen today in follow-up. He recently was admitted at [facility] for acute limb ischemia of the left leg. 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 He ultimately had a peripheral angiogram and underwent left femoral-tibial bypass. He has had some difficulty in wound healing and is getting home health care. During this hospitalization he had no worsening chest pain or unstable angina. He had a recent stress test which showed no ischemia but a fixed inferior defect and was started on Ranexa for some exertional chest pain. This seems to be working well for him. His A. fib was somewhat uncontrolled during his hospitalization and the rate is somewhere around 100 today. He is reestablishing  on warfarin. He is not on an antiplatelet, but should be. Is also not had a recent lipid profile. In the past she's been intolerant to pravastatin, lovastatin and niacin causing myalgias and pruritus, respectively.  04/01/2016  Greg Booker returns today for follow-up. He is recently had some worsening shortness of  breath and lower extremity swelling. He had lower extremity arterial Dopplers this morning and I receive those preliminary reports indicating that his left arterial system is occluded. There is a significant reduction ABI of 0.57 on the right. He seems to have significant swelling on the right as well. He reports 2-3 pillow orthopnea which is new and shortness of breath with exertion. Weight is up to 194 from 189 pounds. EKG shows A. fib at 93 today with nonspecific ST and T-wave changes.   04/07/2016  Greg Booker returns today for follow-up. Since I last saw him last week he has had improvement in his breathing. He's not needed as many pillows to sleep at night. Weight has decreased from 194-191 pounds with his dry weight somewhere around 189 pounds. He is in A. fib with RVR today and was at a much faster rate yesterday when he had his echo performed. His echocardiogram did show a newly reduced EF to 45% with some diffuse hypokinesis. I suspect this is related to tachycardia related to A. fib. BNP was elevated over 700 with an elevated creatinine to 1.85. He is scheduled to see his nephrologist tomorrow. Hopefully that will repeat his creatinine at that time.  06/29/2016  Greg Booker is seen today in follow-up. Over the past several months she's done well. His weight is asked to down further to 181 from 191 pounds. He denies any worsening shortness of breath or swelling. Heart rate was slightly fast today with A. fib at 109. Blood pressure the right arm was 98/69 and checked in the left arm was 110/70. He does have some PAD and underwent left femoral-tibial bypass. He continues to report some discomfort in the left leg and is followed by Dr. Maryjean Morn in [facility]. Previously increased his metoprolol for rate control however there is little room to increase that further today. He says he is ambulating fairly well. He was in the ER in April the fall and had a compression fracture of the lumbar spine. He is apparently  seeing neurosurgery for this but has not undergone any procedures. His aortic murmur sounds stable compared to prior evaluation.  11/30/2016  Greg Booker returns today for follow-up.  Recently had an episode of chest pain.  He was seen by 1 of our physician assistants and he is nitrates were adjusted.  Subsequently he was short of breath and felt that this could be related to heart failure.  Repeat echo was ordered and his Lasix was increased.  This led to worsening of renal dysfunction and his Lasix was then decreased.  His echo shows in fact his heart function has normalized, with EF up to 55-60%, however he still has moderate aortic stenosis.  We discussed this at some length today and he understands that he may very well need to face aortic valve replacement at some point in the future.  I suspect this would be TAVR procedure as he has multiple comorbidities.  There would be a high risk of renal failure associated with that and possibly pushed him onto dialysis.  He is also on the [facility] P[facility]htronex system.  He has been using it for several months however we do not have recent  lab work to reassess his lipid profile.  He has been intolerant to statins.  01/12/2017  Greg Booker was seen today in follow-up.  He had a difficult couple of months.  He was admitted for pneumonia.  Subsequently he has had persistently elevated heart rate.  In addition he developed a cold foot and underwent repeat peripheral angiography and stenting.  He is now on aspirin, Plavix and warfarin.  INR was slightly supratherapeutic today.  He was started on the [place] Pushtronex system and has had marked improvement in his lipid profile with total cholesterol 69, triglycerides 64, HDL 32 and LDL-C 24.  He denies any anginal symptoms.  Weight is actually down 8 pounds since we last saw him.  EKG today shows A. fib with rapid ventricular response at 124.  His A. fib is permanent.  02/22/2017  Greg Booker returns today for follow-up.  He  reports he is not had any chest pain in the last 5 or 6 weeks.  He was treated again for pneumonia which apparently has resolved.  His heart rate is better controlled today on increased dose diltiazem however his blood pressure has been running low.  He is on 90 mg of Imdur and Ranexa.  He is also on metoprolol.  He is due for an INR check today.  09/02/2017  Greg Booker is seen today in follow-up.  Unfortunately underwent amputation of the left leg.  This was previously bypassed.  His vascular surgeon left and now is seeing a new specialist.  Ultimately though he seems like he is feeling better.  Unfortunately is been having worsening shortness of breath and fatigue.  He does have moderate aortic stenosis by echo that was in October 2018.  He has permanent A. fib which is been rate controlled.  He was anticoagulated on warfarin due to his history of PAD however now that he is status post amputation, family is questioning whether he could be switched over to a direct oral anticoagulant.  Blood pressure is well controlled today.  PMHx:  Past Medical History:  Diagnosis Date  . Anticoagulated on Coumadin   . Arthritis   . Calcified granuloma of lung ([place]) RIGHT LUNG BASE - STABLE PER CXR 03-03-2010  . Chronic back pain   . CKD (chronic kidney disease), stage III ([place])   . Coronary artery disease    a. 1997 PCI/BMS to the RCA; b. 2006 Cath: Occluded RCA with collat flow, EF 60%, otw nonobs dzs; b. 2010 MV: No ischemia; c. 06/2013 MV: low risk w/o ischemia.  . Diabetes mellitus without complication ([place])   . Falls   . GERD (gastroesophageal reflux disease)   . Heart murmur   . History of DVT (deep vein thrombosis)    a. Early 2000's.  Marland Kitchen History of pneumonia 01/2013  . Hyperlipidemia   . Hypertensive heart disease   . Impaired hearing BILATERAL AIDS  . Low back pain radiating to left leg   . Mitral regurgitation   . Nocturia   . Numbness and tingling of left leg   . PAF (paroxysmal atrial  fibrillation) (HCC)    a. CHA2DS2VASc = 5-->chronic coumadin.  . Prostate cancer ([place]) RECUR   a. 2006 S/P radioactive seed implant.  . Sciatic nerve pain LEFT LEG PAIN--  EPI INJECTIONS  . Urgency of urination     Past Surgical History:  Procedure Laterality Date  . BACK SURGERY    . CARDIAC CATHETERIZATION  1998   IN-STENT RESTENOSIS-- LARGE COLLATERALS  .  CARDIAC CATHETERIZATION  2003  &  2006   2006 ---  MITRAL REGURG/ 100% OCCULSION RCA WITH LARGE COLLATERALS/ MILD DISEASE LAD & CIRCUMFLEX  . CARDIOVASCULAR STRESS TEST  06-03-2008   NORMAL STUDY/ LV NORMAL / COMPARED TO 12-26-2000 INFERIOR WALL ISCHEMIA IS NO LONGER PRESENT.  Marland Kitchen CARDIOVERSION  09-15-2005   UNSUCCESSFUL  . CORONARY ANGIOPLASTY WITH STENT PLACEMENT  1997- DR AL LITTLE   X1 STENT RCA  . CRYOABLATION  03/01/2011   Procedure: CRYO ABLATION PROSTATE;  Surgeon: Ailene Rud, MD;  [facility];  Service: Urology;  Laterality: N/A;  . EXCISION BENIGN BREAST MASS  08-06-2008   LEFT  . JOINT REPLACEMENT    . LAMINECTOMY  10-28-2008   L3 - 4  . LUMBAR LAMINECTOMY/DECOMPRESSION MICRODISCECTOMY Right 04/05/2013   Procedure: Right Lumbar three-four Laminectomy ;  Surgeon: Charlie Pitter, MD;  Location: [facility];  Service: Neurosurgery;  Laterality: Right;  . RADIOACTIVE SEED IMPLANT  08-03-2004   PROSTATE  . TOTAL HIP ARTHROPLASTY  11/02/2011   Procedure: TOTAL HIP ARTHROPLASTY;  Surgeon: Garald Balding, MD;  Location: [facility];  Service: Orthopedics;  Laterality: Left;  Left Total Hip Replacement  . TOTAL HIP ARTHROPLASTY Right 06/26/2013   Procedure: TOTAL HIP ARTHROPLASTY;  Surgeon: Garald Balding, MD;  Location: [facility];  Service: Orthopedics;  Laterality: Right;  . TRANSTHORACIC ECHOCARDIOGRAM  07-26-2007   LVSF NORMAL/ EF 55%/ LEFT ATRIUM 4.5CM MILD TO MOD. DILATED/ MILD  MITRAL & TRICUSPID REGURG    FAMHx:  Family History  Problem Relation Age of Onset  . Arthritis Mother   .  Other Mother 73  . Heart Problems Father   . Heart disease Brother   . Breast cancer Sister   . Cancer Sister   . Heart Problems Brother     SOCHx:   reports that he quit smoking about 45 years ago. His smoking use included cigarettes and pipe. He has a 25.00 pack-year smoking history. He has never used smokeless tobacco. He reports that he does not drink alcohol or use drugs.  ALLERGIES:  Allergies  Allergen Reactions  . Bicalutamide Other (See Comments)    Casodex caused increase in breast size  . Codeine Itching and Rash  . Niacin And Related Other (See Comments)    Burning and stinging from tablets (otc capsules ok)  . Lovastatin Other (See Comments)    Leg weakness  . Beef Extract Rash  . Pravastatin Other (See Comments)    Myalgia    ROS: Pertinent items noted in HPI and remainder of comprehensive ROS otherwise negative.  HOME MEDS: Current Outpatient Medications  Medication Sig Dispense Refill  . aspirin EC 81 MG tablet Take 81 mg by mouth daily.     . B Complex Vitamins (VITAMIN B COMPLEX PO) Take 1 tablet by mouth daily.     . Cholecalciferol (VITAMIN D3) 5000 units CAPS Take 5,000 Units by mouth daily.    Marland Kitchen diltiazem (DILTIAZEM CD) 180 MG 24 hr capsule Take 1 capsule (180 mg total) by mouth daily. 90 capsule 3  . docusate sodium (COLACE) 100 MG capsule Take 1 capsule (100 mg total) by mouth 2 (two) times daily as needed for mild constipation. 30 capsule 0  . Evolocumab with Infusor ([place]) 420 MG/3.5ML SOCT Inject 420 mg into the skin every 30 (thirty) days.    . furosemide (LASIX) 40 MG tablet TAKE 1 TABLET BY MOUTH TWO  TIMES DAILY 180  tablet 3  . gabapentin (NEURONTIN) 100 MG capsule Take 200 mg by mouth at bedtime.     . isosorbide mononitrate (IMDUR) 60 MG 24 hr tablet Take 60 mg by mouth daily.    . metoprolol succinate (TOPROL-XL) 100 MG 24 hr tablet Take 1 tablet (100 mg total) by mouth daily. Take with or immediately following a meal.  90 tablet 3  . metoprolol succinate (TOPROL-XL) 25 MG 24 hr tablet TAKE 3 TABLETS BY MOUTH  DAILY WITH OR IMMEDIATELY  FOLLOWING A MEAL 270 tablet 3  . Multiple Vitamins-Minerals (MULTIVITAMIN WITH MINERALS) tablet Take 1 tablet by mouth daily.    . nitroGLYCERIN (NITROSTAT) 0.4 MG SL tablet Place 1 tablet (0.4 mg total) under the tongue every 5 (five) minutes as needed for chest pain. 25 tablet PRN  . ranitidine (ZANTAC) 150 MG tablet Take 150 mg by mouth daily.    . ranolazine (RANEXA) 500 MG 12 hr tablet Take 1 tablet (500 mg total) by mouth 2 (two) times daily. 60 tablet 5  . Tamsulosin HCl (FLOMAX) 0.4 MG CAPS Take 0.4 mg by mouth daily after supper.    . vitamin C (ASCORBIC ACID) 500 MG tablet Take 500 mg by mouth daily.    Marland Kitchen warfarin (COUMADIN) 5 MG tablet TAKE ONE-HALF TO ONE TABLETS BY MOUTH ONCE DAILY AS DIRECTED BY COUMADIN CLINIC (Patient taking differently: Take 2.5 mg by mouth in the evening on Sun/Tues/Thurs/Sat and 5 mg on Mon/Wed/Fri) 90 tablet 0  . bicalutamide (CASODEX) 50 MG tablet Take 50 mg by mouth daily.     No current facility-administered medications for this visit.     LABS/IMAGING: No results found for this or any previous visit (from the past 48 hour(s)). No results found.  VITALS: BP 114/68   Pulse 89   Ht 5\' 9"  (1.753 m)   Wt 175 lb (79.4 kg)   BMI 25.84 kg/m   EXAM: General appearance: alert, appears older than stated age and no distress Neck: no carotid bruit, no JVD and thyroid not enlarged, symmetric, no tenderness/mass/nodules Lungs: clear to auscultation bilaterally Heart: irregularly irregular rhythm, S1, S2 normal and systolic murmur: late systolic 3/6, crescendo at 2nd right intercostal space Abdomen: soft, non-tender; bowel sounds normal; no masses,  no organomegaly Extremities: extremities normal, atraumatic, no cyanosis or edema and LLE notable for bypass, 1+ pulses Pulses: 2+ and symmetric Skin: Skin color, texture, turgor normal. No  rashes or lesions Neurologic: Grossly normal Psych: Pleasant  EKG: A. fib at 89, nonspecific ST changes-personally reviewed  ASSESSMENT: 1. Chronic systolic congestive heart failure - LVEF decreased to 45% (global HK) 03/2016 2. PAD with left BKA 3. Dizziness, imbalance, frequent falls 4. Permanent atrial fibrillation - CHADSVASC score of 5 (anticoagulated on warfarin) 5. Coronary artery disease with occluded right coronary left to right collaterals, EF 50% 6. Moderate aortic stenosis. 7. Recent pneumonia 8. Hypertension 9. Dyslipidemia 10. Recent fall with vertebral compression fracture  PLAN: 1.   Mr. Kent has recently been having some more exertional fatigue while trying to rehab after amputation of the left lower leg amputation.  On exam today he has a late peaking harsh systolic murmur which is high-pitched concerning for severe aortic stenosis.  I like to repeat an echocardiogram as likely he is having severe symptomatic aortic stenosis.  In addition, now that he has had amputation, we could consider transitioning over to Xarelto.  We will check an INR today and start the medication accordingly.  Plan follow-up with  me after his echo.  Pixie Casino, MD, [facility], [place] Director of the Advanced Lipid Disorders &  Cardiovascular Risk Reduction Clinic Attending Cardiologist  Direct Dial: [phone]  Fax: [phone]  Website:  [email or web address] 09/02/2017, 9:58 AM

## 2017-09-02 NOTE — Patient Instructions (Addendum)
Your physician has recommended you make the following change in your medication:  -- STOP warfarin today 09/02/17 -- START xarelto 15mg  daily on 09/03/17 with biggest meal of the day  Your physician has requested that you have an echocardiogram. Echocardiography is a painless test that uses sound waves to create images of your heart. It provides your doctor with information about the size and shape of your heart and how well your heart's chambers and valves are working. This procedure takes approximately one hour. There are no restrictions for this procedure. -- done at [address] physician recommends that you schedule a follow-up appointment after echo with Dr. Debara Pickett.

## 2017-09-02 NOTE — Addendum Note (Signed)
Addended by: Fidel Levy on: 09/02/2017 10:44 AM   Modules accepted: Orders

## 2017-09-07 ENCOUNTER — Other Ambulatory Visit: Payer: Self-pay

## 2017-09-07 ENCOUNTER — Ambulatory Visit (HOSPITAL_COMMUNITY): Payer: Medicare Other | Attending: Internal Medicine

## 2017-09-07 DIAGNOSIS — E785 Hyperlipidemia, unspecified: Secondary | ICD-10-CM | POA: Insufficient documentation

## 2017-09-07 DIAGNOSIS — M549 Dorsalgia, unspecified: Secondary | ICD-10-CM | POA: Insufficient documentation

## 2017-09-07 DIAGNOSIS — I350 Nonrheumatic aortic (valve) stenosis: Secondary | ICD-10-CM | POA: Diagnosis not present

## 2017-09-07 DIAGNOSIS — G8929 Other chronic pain: Secondary | ICD-10-CM | POA: Diagnosis not present

## 2017-09-07 DIAGNOSIS — I2510 Atherosclerotic heart disease of native coronary artery without angina pectoris: Secondary | ICD-10-CM | POA: Diagnosis not present

## 2017-09-07 DIAGNOSIS — N189 Chronic kidney disease, unspecified: Secondary | ICD-10-CM | POA: Diagnosis not present

## 2017-09-07 DIAGNOSIS — I081 Rheumatic disorders of both mitral and tricuspid valves: Secondary | ICD-10-CM | POA: Diagnosis not present

## 2017-09-07 DIAGNOSIS — E1122 Type 2 diabetes mellitus with diabetic chronic kidney disease: Secondary | ICD-10-CM | POA: Diagnosis not present

## 2017-09-07 MED ORDER — PERFLUTREN LIPID MICROSPHERE
1.0000 mL | INTRAVENOUS | Status: AC | PRN
  Administered 2017-09-07: 3 mL via INTRAVENOUS

## 2017-09-11 HISTORY — PX: ESOPHAGOGASTRODUODENOSCOPY: SHX1529

## 2017-09-11 HISTORY — PX: COLONOSCOPY W/ POLYPECTOMY: SHX1380

## 2017-09-23 ENCOUNTER — Ambulatory Visit (INDEPENDENT_AMBULATORY_CARE_PROVIDER_SITE_OTHER): Payer: Medicare Other | Admitting: Pharmacist

## 2017-09-23 DIAGNOSIS — Z7901 Long term (current) use of anticoagulants: Secondary | ICD-10-CM | POA: Diagnosis not present

## 2017-09-23 DIAGNOSIS — I482 Chronic atrial fibrillation, unspecified: Secondary | ICD-10-CM

## 2017-09-23 DIAGNOSIS — I481 Persistent atrial fibrillation: Secondary | ICD-10-CM | POA: Diagnosis not present

## 2017-09-23 DIAGNOSIS — I4811 Longstanding persistent atrial fibrillation: Secondary | ICD-10-CM

## 2017-09-23 LAB — POCT INR: INR: 1.4 — AB (ref 2.0–3.0)

## 2017-09-27 ENCOUNTER — Ambulatory Visit (INDEPENDENT_AMBULATORY_CARE_PROVIDER_SITE_OTHER): Payer: Medicare Other | Admitting: Orthopedic Surgery

## 2017-09-27 ENCOUNTER — Encounter (INDEPENDENT_AMBULATORY_CARE_PROVIDER_SITE_OTHER): Payer: Self-pay | Admitting: Orthopedic Surgery

## 2017-09-27 ENCOUNTER — Ambulatory Visit (INDEPENDENT_AMBULATORY_CARE_PROVIDER_SITE_OTHER): Payer: Self-pay

## 2017-09-27 VITALS — BP 100/57 | HR 89 | Ht 69.0 in | Wt 175.0 lb

## 2017-09-27 DIAGNOSIS — M25561 Pain in right knee: Secondary | ICD-10-CM

## 2017-09-27 DIAGNOSIS — M1711 Unilateral primary osteoarthritis, right knee: Secondary | ICD-10-CM | POA: Diagnosis not present

## 2017-09-27 MED ORDER — LIDOCAINE HCL 1 % IJ SOLN
2.0000 mL | INTRAMUSCULAR | Status: AC | PRN
  Administered 2017-09-27: 2 mL

## 2017-09-27 MED ORDER — BUPIVACAINE HCL 0.5 % IJ SOLN
2.0000 mL | INTRAMUSCULAR | Status: AC | PRN
  Administered 2017-09-27: 2 mL via INTRA_ARTICULAR

## 2017-09-27 MED ORDER — METHYLPREDNISOLONE ACETATE 40 MG/ML IJ SUSP
80.0000 mg | INTRAMUSCULAR | Status: AC | PRN
  Administered 2017-09-27: 80 mg

## 2017-09-27 NOTE — Progress Notes (Signed)
Office Visit Note   Patient: Greg Booker           Date of Birth: [birth year]           MRN: [id number] Visit Date: 09/27/2017              Requested by: Raina Mina., MD [address] [place], [place] [address] PCP: Raina Mina., MD   Assessment & Plan: Visit Diagnoses:  1. Right knee pain, unspecified chronicity   2. Unilateral primary osteoarthritis, right knee     Plan: #1: Corticosteroid injected into the right knee. #2: I had spoken to his nephrologist at [facility] who allowed for the injection.   Follow-Up Instructions: Return if symptoms worsen or fail to improve.   Face-to-face time spent with patient was greater than 30 minutes.  Greater than 50% of the time was spent in counseling and coordination of care.  Orders:  Orders Placed This Encounter  Procedures  . XR KNEE 3 VIEW RIGHT   No orders of the defined types were placed in this encounter.     Procedures: Large Joint Inj: R knee on 09/27/2017 11:09 AM Indications: pain and diagnostic evaluation Details: 25 G 1.5 in needle, medial approach  Arthrogram: No  Medications: 2 mL lidocaine 1 %; 2 mL bupivacaine 0.5 %; 80 mg methylPREDNISolone acetate 40 MG/ML Outcome: tolerated well, no immediate complications Procedure, treatment alternatives, risks and benefits explained, specific risks discussed. Consent was given by the patient. Immediately prior to procedure a time out was called to verify the correct patient, procedure, equipment, support staff and site/side marked as required. Patient was prepped and draped in the usual sterile fashion.       Clinical Data: No additional findings.   Subjective: Chief Complaint  Patient presents with  . Follow-up    pt has hx of l knee amputation 03/2017. pt hasl knee prosthesis and is doing PT but having pain in r knee. not sure if due from r leg bearing most of his weight    HPI  Greg Booker is a very pleasant 84 year old white male who is seen today for  evaluation of his right knee.  He is status post left below-knee amputation in March of this year for essentially hemic problems.  He did has been given a prosthesis for the left leg but he is quite weak and has difficulty with ambulation.  His right knee has been bothering him which is also limiting his ability to use his prosthesis.  His main complaints today are the pain in the right knee as well as his weakness.  He does have a significant knee disease and is very close to dialysis.   Review of Systems  Constitutional: Negative for fatigue and fever.  HENT: Negative for ear pain.   Eyes: Negative for pain.  Respiratory: Negative for cough and shortness of breath.   Cardiovascular: Positive for leg swelling.  Gastrointestinal: Positive for constipation. Negative for diarrhea.  Genitourinary: Negative for difficulty urinating.  Musculoskeletal: Negative for back pain and neck pain.  Skin: Negative for rash.  Allergic/Immunologic: Negative for food allergies.  Neurological: Positive for weakness and numbness.  Hematological: Bruises/bleeds easily.  Psychiatric/Behavioral: Negative for sleep disturbance.     Objective: Vital Signs: BP (!) 100/57 (BP Location: Left Arm, Patient Position: Sitting, Cuff Size: Normal)   Pulse 89   Ht 5\' 9"  (1.753 m)   Wt 175 lb (79.4 kg)   BMI 25.84 kg/m   Physical Exam  Constitutional: He is oriented to person, place, and time. He appears well-developed and well-nourished.  HENT:  Mouth/Throat: Oropharynx is clear and moist.  Eyes: Pupils are equal, round, and reactive to light. EOM are normal.  Pulmonary/Chest: Effort normal.  Neurological: He is alert and oriented to person, place, and time.  Skin: Skin is warm and dry.  Psychiatric: He has a normal mood and affect. His behavior is normal.    Ortho Exam  Today he has minimal effusion of the right knee.  He does have decreased range of motion from about 5 to 7 degrees shy of full extension to  about 95 degrees of flexion.  No warmth or erythema.  No ecchymosis noted.  Appears ligamentously stable from medial lateral collateral ligament.  Calf is supple nontender.   Specialty Comments:  No specialty comments available.  Imaging: Xr Knee 3 View Right  Result Date: 09/27/2017 Three-view x-ray of the right knee reveals varus deformity of the right lower extremity.  Peaked intercondylar spines noted.  The medial compartment is markedly narrowed by about 60 to 70%.  Periarticular spurring is noted both medially and laterally.  Some cystic changes are noted in the proximal lateral tibial plateau.  A very sclerotic bone mainly on the medial tibial plateau.  Patellofemoral DJD is also noted.    PMFS History: Current Outpatient Medications  Medication Sig Dispense Refill  . aspirin EC 81 MG tablet Take 81 mg by mouth daily.     . B Complex Vitamins (VITAMIN B COMPLEX PO) Take 1 tablet by mouth daily.     . bicalutamide (CASODEX) 50 MG tablet Take 50 mg by mouth daily.    . Cholecalciferol (VITAMIN D3) 5000 units CAPS Take 5,000 Units by mouth daily.    Marland Kitchen diltiazem (DILTIAZEM CD) 180 MG 24 hr capsule Take 1 capsule (180 mg total) by mouth daily. 90 capsule 3  . docusate sodium (COLACE) 100 MG capsule Take 1 capsule (100 mg total) by mouth 2 (two) times daily as needed for mild constipation. 30 capsule 0  . Evolocumab with Infusor ([place]) 420 MG/3.5ML SOCT Inject 420 mg into the skin every 30 (thirty) days.    . furosemide (LASIX) 40 MG tablet TAKE 1 TABLET BY MOUTH TWO  TIMES DAILY 180 tablet 3  . gabapentin (NEURONTIN) 100 MG capsule Take 200 mg by mouth at bedtime.     . isosorbide mononitrate (IMDUR) 60 MG 24 hr tablet Take 60 mg by mouth daily.    . metoprolol succinate (TOPROL-XL) 100 MG 24 hr tablet Take 1 tablet (100 mg total) by mouth daily. Take with or immediately following a meal. 90 tablet 3  . metoprolol succinate (TOPROL-XL) 25 MG 24 hr tablet TAKE 3  TABLETS BY MOUTH  DAILY WITH OR IMMEDIATELY  FOLLOWING A MEAL 270 tablet 3  . Multiple Vitamins-Minerals (MULTIVITAMIN WITH MINERALS) tablet Take 1 tablet by mouth daily.    . nitroGLYCERIN (NITROSTAT) 0.4 MG SL tablet Place 1 tablet (0.4 mg total) under the tongue every 5 (five) minutes as needed for chest pain. 25 tablet PRN  . ranitidine (ZANTAC) 150 MG tablet Take 150 mg by mouth daily.    . ranolazine (RANEXA) 500 MG 12 hr tablet Take 1 tablet (500 mg total) by mouth 2 (two) times daily. 60 tablet 5  . Rivaroxaban (XARELTO) 15 MG TABS tablet Take 1 tablet (15 mg total) by mouth daily with supper. 30 tablet 11  . Tamsulosin HCl (FLOMAX) 0.4 MG CAPS  Take 0.4 mg by mouth daily after supper.    . vitamin C (ASCORBIC ACID) 500 MG tablet Take 500 mg by mouth daily.     No current facility-administered medications for this visit.     Patient Active Problem List   Diagnosis Date Noted  . Status post below knee amputation of left lower extremity ([place]) 09/02/2017  . Diastolic CHF, chronic ([place]) 02/22/2017  . Permanent atrial fibrillation ([place]) 02/22/2017  . On anticoagulant therapy 08/09/2016  . Diabetic neuropathy ([place]) 08/09/2016  . Hypertensive heart and chronic kidney disease 08/09/2016  . Osteoarthritis 08/09/2016  . Overweight 08/09/2016  . Vitamin D deficiency 08/09/2016  . Coronary atherosclerosis 07/19/2016  . Bilateral carotid bruits 07/13/2016  . Acute on chronic combined systolic and diastolic CHF (congestive heart failure) ([place]) 06/29/2016  . Acute congestive heart failure ([place]) 04/02/2016  . Aortic valve stenosis 04/02/2016  . Shortness of breath 04/02/2016  . Atheroscler of native artery of both legs with intermit claudication ([place]) 04/01/2016  . Status post partial amputation of foot, left ([place]) 04/01/2016  . Dyslipidemia 01/21/2016  . PVD (peripheral vascular disease) with claudication ([place]) 01/21/2016  . CAD (coronary artery disease) 01/21/2016  . Diabetic ulcer of left  midfoot associated with diabetes mellitus due to underlying condition, with fat layer exposed ([place]) 01/14/2016  . Gram-negative bacteremia 12/09/2015  . AKI (acute kidney injury) ([place]) 12/08/2015  . Diabetes mellitus ([place]) 12/08/2015  . Pulmonary edema 12/08/2015  . Sepsis ([place]) 12/08/2015  . ST segment depression 12/08/2015  . Toe amputation status, left ([place]) 12/08/2015  . Troponin level elevated 12/08/2015  . Hyperlipidemia 11/04/2015  . Atherosclerosis of native artery of left lower extremity with rest pain ([place]) 10/28/2015  . Diabetic peripheral vascular disease ([place]) 10/24/2015  . Chest pain with moderate risk of acute coronary syndrome 10/07/2015  . Recurrent falls 06/12/2015  . Imbalance 06/12/2015  . Hypertensive heart disease without heart failure 06/12/2015  . Hypertensive heart disease   . Falls   . Hallux rigidus of both feet 07/04/2014  . Plantar fasciitis, bilateral 07/04/2014  . Avascular necrosis of bone of right hip ([place]) 06/28/2013  . S/P total hip arthroplasty 06/26/2013  . Lumbosacral spondylosis without myelopathy 04/05/2013  . Spondylosis of lumbar joint 04/05/2013  . bicalutamide-induced gynecomastia 10/17/2012  . Hypokalemia 11/04/2011  . CAD S/P percutaneous coronary angioplasty 11/03/2011  . Hypertension 11/03/2011  . CKD (chronic kidney disease) 11/03/2011  . Avascular necrosis of femoral head, s/p [facility] 10/28/2011  . Malignant tumor of prostate ([place]) 03/01/2011   Past Medical History:  Diagnosis Date  . Anticoagulated on Coumadin   . Arthritis   . Calcified granuloma of lung ([place]) RIGHT LUNG BASE - STABLE PER CXR 03-03-2010  . Chronic back pain   . CKD (chronic kidney disease), stage III ([place])   . Coronary artery disease    a. 1997 PCI/BMS to the RCA; b. 2006 Cath: Occluded RCA with collat flow, EF 60%, otw nonobs dzs; b. 2010 MV: No ischemia; c. 06/2013 MV: low risk w/o ischemia.  . Diabetes mellitus without complication ([place])   . Falls   . GERD  (gastroesophageal reflux disease)   . Heart murmur   . History of DVT (deep vein thrombosis)    a. Early 2000's.  Marland Kitchen History of pneumonia 01/2013  . Hyperlipidemia   . Hypertensive heart disease   . Impaired hearing BILATERAL AIDS  . Low back pain radiating to left leg   . Mitral regurgitation   . Nocturia   .  Numbness and tingling of left leg   . PAF (paroxysmal atrial fibrillation) ([facility])    a. CHA2DS2VASc = 5-->chronic coumadin.  . Prostate cancer ([place]) RECUR   a. 2006 S/P radioactive seed implant.  . Sciatic nerve pain LEFT LEG PAIN--  EPI INJECTIONS  . Urgency of urination     Family History  Problem Relation Age of Onset  . Arthritis Mother   . Other Mother 74  . Heart Problems Father   . Heart disease Brother   . Breast cancer Sister   . Cancer Sister   . Heart Problems Brother     Past Surgical History:  Procedure Laterality Date  . BACK SURGERY    . CARDIAC CATHETERIZATION  1998   IN-STENT RESTENOSIS-- LARGE COLLATERALS  . CARDIAC CATHETERIZATION  2003  &  2006   2006 ---  MITRAL REGURG/ 100% OCCULSION RCA WITH LARGE COLLATERALS/ MILD DISEASE LAD & CIRCUMFLEX  . CARDIOVASCULAR STRESS TEST  06-03-2008   NORMAL STUDY/ LV NORMAL / COMPARED TO 12-26-2000 INFERIOR WALL ISCHEMIA IS NO LONGER PRESENT.  Marland Kitchen CARDIOVERSION  09-15-2005   UNSUCCESSFUL  . CORONARY ANGIOPLASTY WITH STENT PLACEMENT  1997- DR AL LITTLE   X1 STENT RCA  . CRYOABLATION  03/01/2011   Procedure: CRYO ABLATION PROSTATE;  Surgeon: Ailene Rud, MD;  [facility];  Service: Urology;  Laterality: N/A;  . EXCISION BENIGN BREAST MASS  08-06-2008   LEFT  . JOINT REPLACEMENT    . LAMINECTOMY  10-28-2008   L3 - 4  . LUMBAR LAMINECTOMY/DECOMPRESSION MICRODISCECTOMY Right 04/05/2013   Procedure: Right Lumbar three-four Laminectomy ;  Surgeon: Charlie Pitter, MD;  Location: [facility];  Service: Neurosurgery;  Laterality: Right;  . RADIOACTIVE SEED IMPLANT  08-03-2004   PROSTATE    . TOTAL HIP ARTHROPLASTY  11/02/2011   Procedure: TOTAL HIP ARTHROPLASTY;  Surgeon: Garald Balding, MD;  Location: [facility];  Service: Orthopedics;  Laterality: Left;  Left Total Hip Replacement  . TOTAL HIP ARTHROPLASTY Right 06/26/2013   Procedure: TOTAL HIP ARTHROPLASTY;  Surgeon: Garald Balding, MD;  Location: [facility];  Service: Orthopedics;  Laterality: Right;  . TRANSTHORACIC ECHOCARDIOGRAM  07-26-2007   LVSF NORMAL/ EF 55%/ LEFT ATRIUM 4.5CM MILD TO MOD. DILATED/ MILD  MITRAL & TRICUSPID REGURG   Social History   Occupational History  . Not on file  Tobacco Use  . Smoking status: Former Smoker    Packs/day: 1.00    Years: 25.00    Pack years: 25.00    Types: Cigarettes, Pipe    Last attempt to quit: 02/23/1972    Years since quitting: 45.6  . Smokeless tobacco: Never Used  Substance and Sexual Activity  . Alcohol use: No  . Drug use: No  . Sexual activity: Not on file

## 2017-10-07 ENCOUNTER — Other Ambulatory Visit: Payer: Self-pay | Admitting: Internal Medicine

## 2017-10-07 ENCOUNTER — Ambulatory Visit (INDEPENDENT_AMBULATORY_CARE_PROVIDER_SITE_OTHER): Payer: Medicare Other | Admitting: Pharmacist Clinician (PhC)/ Clinical Pharmacy Specialist

## 2017-10-07 DIAGNOSIS — I4811 Longstanding persistent atrial fibrillation: Secondary | ICD-10-CM

## 2017-10-07 DIAGNOSIS — I481 Persistent atrial fibrillation: Secondary | ICD-10-CM | POA: Diagnosis not present

## 2017-10-07 LAB — POCT INR: INR: 2.8 (ref 2.0–3.0)

## 2017-10-07 NOTE — Telephone Encounter (Signed)
°*  STAT* If patient is at the pharmacy, call can be transferred to refill team.   1. Which medications need to be refilled? (please list name of each medication and dose if known) wasarin  2. Which pharmacy/location (including street and city if local pharmacy) is medication to be sent to? [facility]  [place]   3. Do they need a 30 day or 90 day supply? [place]

## 2017-10-10 MED ORDER — WARFARIN SODIUM 5 MG PO TABS: ORAL_TABLET | ORAL | 0 refills | Status: AC

## 2017-10-10 NOTE — Addendum Note (Signed)
Addended by: Rockne Menghini on: 10/10/2017 07:34 AM   Modules accepted: Orders

## 2017-10-11 ENCOUNTER — Ambulatory Visit: Payer: Medicare Other | Admitting: Internal Medicine

## 2017-10-17 ENCOUNTER — Encounter: Payer: Self-pay | Admitting: Internal Medicine

## 2017-10-17 ENCOUNTER — Ambulatory Visit: Payer: Medicare Other | Admitting: Internal Medicine

## 2017-10-17 VITALS — BP 98/50 | HR 95 | Ht 69.0 in | Wt 170.0 lb

## 2017-10-17 DIAGNOSIS — Z7901 Long term (current) use of anticoagulants: Secondary | ICD-10-CM | POA: Diagnosis not present

## 2017-10-17 DIAGNOSIS — I350 Nonrheumatic aortic (valve) stenosis: Secondary | ICD-10-CM | POA: Diagnosis not present

## 2017-10-17 DIAGNOSIS — I4811 Longstanding persistent atrial fibrillation: Secondary | ICD-10-CM | POA: Diagnosis not present

## 2017-10-17 DIAGNOSIS — Z9889 Other specified postprocedural states: Secondary | ICD-10-CM | POA: Diagnosis not present

## 2017-10-17 MED ORDER — DILTIAZEM HCL ER 120 MG PO CP24: 120.0000 mg | ORAL_CAPSULE | Freq: Every day | ORAL | 3 refills | Status: DC

## 2017-10-17 NOTE — Progress Notes (Signed)
OFFICE NOTE  Chief Complaint:  Exertional fatigue  Primary Care Physician: Raina Mina., MD  HPI:  Greg Booker is an 84 year old gentleman followed by Dr. Rex Kras with history of coronary disease in the past. Cardiac catheterization in 2006 showed an occluded right coronary with collaterals, EF 50%. A stress test in 2010 most recently was negative for ischemia. He has had trouble with DVT and has been off Coumadin for periods of time. Recently he had hip surgery, which he underwent uneventfully and has done well and is back on his Coumadin. Unfortunately, his levels were too high; as of January 3 his INR was 6.9.  It now seems to be more regulated. He is describing no symptoms of bleeding, shortness of breath, palpitations, presyncope, syncopal symptoms, chest pain, or any associated cardiac findings.   [name] returns today and is feeling quite well. Unfortunately a few weeks ago he developed a pneumonia and was treated for this. He is now almost completely recovered. Fortunately, his warfarin levels have remained therapeutic. His gotten back to his activities and is looking forward to going on a cruise for the next week.  I had the pleasure of seeing [name] back today in follow-up. He was last seen in 2015 and underwent a stress test which is negative for ischemia as part of a preoperative workup for hip replacement. He ultimately underwent hip replacement is doing very well. He denies any chest pain or worsening shortness of breath. I reviewed his medicines today noted that he is in chronic/permanent atrial fibrillation. His INR is a been stable and followed by Erasmo Downer, our anticoagulation pharmacist. He has been on long-term digoxin which was started by his previous cardiologist. He does have stage III chronic kidney disease. Recently there is data from a couple of very good studies which indicate that digoxin may increase the risk of mortality and that there was a clear cut off of  digoxin levels greater than 1.2 which wasn't associated with increased mortality, especially in patients with known coronary artery disease. He has not had a digoxin level tested in some time - it would be recommended that he has this testing done at least every 6 months.  06/12/2015  [name] was seen back today in follow-up. Recently he was seen by Ignacia Bayley, NP, for imbalance, dizziness and difficulty walking. He denied any true syncopal episodes. [name] ordered an echocardiogram and monitor. The monitor showed rate controlled A. fib without any significant pauses or tachyarrhythmias. The echocardiogram showed moderate aortic stenosis with normal LV function. Neither study indicated a clear etiology of his imbalance and dizziness. Concern is for his fall risk as he is on warfarin anticoagulation. Mr. Alpert does report he has a history of low back pain and neurosurgery.  01/21/2015  [name] was seen today in follow-up. He recently was admitted at [facility] for acute limb ischemia of the left leg. 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 He ultimately had a peripheral angiogram and underwent left femoral-tibial bypass. He has had some difficulty in wound healing and is getting home health care. During this hospitalization he had no worsening chest pain or unstable angina. He had a recent stress test which showed no ischemia but a fixed inferior defect and was started on Ranexa for some exertional chest pain. This seems to be working well for him. His A. fib was somewhat uncontrolled during his hospitalization and the rate is somewhere around 100 today. He is reestablishing  on warfarin. He is not on an antiplatelet, but should be. Is also not had a recent lipid profile. In the past she's been intolerant to pravastatin, lovastatin and niacin causing myalgias and pruritus, respectively.  04/01/2016  [name] returns today for follow-up. He is recently had some worsening shortness of  breath and lower extremity swelling. He had lower extremity arterial Dopplers this morning and I receive those preliminary reports indicating that his left arterial system is occluded. There is a significant reduction ABI of 0.57 on the right. He seems to have significant swelling on the right as well. He reports 2-3 pillow orthopnea which is new and shortness of breath with exertion. Weight is up to 194 from 189 pounds. EKG shows A. fib at 93 today with nonspecific ST and T-wave changes.   04/07/2016  [name] returns today for follow-up. Since I last saw him last week he has had improvement in his breathing. He's not needed as many pillows to sleep at night. Weight has decreased from 194-191 pounds with his dry weight somewhere around 189 pounds. He is in A. fib with RVR today and was at a much faster rate yesterday when he had his echo performed. His echocardiogram did show a newly reduced EF to 45% with some diffuse hypokinesis. I suspect this is related to tachycardia related to A. fib. BNP was elevated over 700 with an elevated creatinine to 1.85. He is scheduled to see his nephrologist tomorrow. Hopefully that will repeat his creatinine at that time.  06/29/2016  [name] is seen today in follow-up. Over the past several months she's done well. His weight is asked to down further to 181 from 191 pounds. He denies any worsening shortness of breath or swelling. Heart rate was slightly fast today with A. fib at 109. Blood pressure the right arm was 98/69 and checked in the left arm was 110/70. He does have some PAD and underwent left femoral-tibial bypass. He continues to report some discomfort in the left leg and is followed by Dr. Maryjean Morn in [facility]. Previously increased his metoprolol for rate control however there is little room to increase that further today. He says he is ambulating fairly well. He was in the ER in April the fall and had a compression fracture of the lumbar spine. He is apparently  seeing neurosurgery for this but has not undergone any procedures. His aortic murmur sounds stable compared to prior evaluation.  11/30/2016  Mr. Demilio returns today for follow-up.  Recently had an episode of chest pain.  He was seen by 1 of our physician assistants and he is nitrates were adjusted.  Subsequently he was short of breath and felt that this could be related to heart failure.  Repeat echo was ordered and his Lasix was increased.  This led to worsening of renal dysfunction and his Lasix was then decreased.  His echo shows in fact his heart function has normalized, with EF up to 55-60%, however he still has moderate aortic stenosis.  We discussed this at some length today and he understands that he may very well need to face aortic valve replacement at some point in the future.  I suspect this would be TAVR procedure as he has multiple comorbidities.  There would be a high risk of renal failure associated with that and possibly pushed him onto dialysis.  He is also on the [place] Pushtronex system.  He has been using it for several months however we do not have recent  lab work to reassess his lipid profile.  He has been intolerant to statins.  01/12/2017  Mr. Cribb was seen today in follow-up.  He had a difficult couple of months.  He was admitted for pneumonia.  Subsequently he has had persistently elevated heart rate.  In addition he developed a cold foot and underwent repeat peripheral angiography and stenting.  He is now on aspirin, Plavix and warfarin.  INR was slightly supratherapeutic today.  He was started on the [place] Pushtronex system and has had marked improvement in his lipid profile with total cholesterol 69, triglycerides 64, HDL 32 and LDL-C 24.  He denies any anginal symptoms.  Weight is actually down 8 pounds since we last saw him.  EKG today shows A. fib with rapid ventricular response at 124.  His A. fib is permanent.  02/22/2017  Mr. Stetler returns today for follow-up.  He  reports he is not had any chest pain in the last 5 or 6 weeks.  He was treated again for pneumonia which apparently has resolved.  His heart rate is better controlled today on increased dose diltiazem however his blood pressure has been running low.  He is on 90 mg of Imdur and Ranexa.  He is also on metoprolol.  He is due for an INR check today.  09/02/2017  Mr. Fontanilla is seen today in follow-up.  Unfortunately underwent amputation of the left leg.  This was previously bypassed.  His vascular surgeon left and now is seeing a new specialist.  Ultimately though he seems like he is feeling better.  Unfortunately is been having worsening shortness of breath and fatigue.  He does have moderate aortic stenosis by echo that was in October 2018.  He has permanent A. fib which is been rate controlled.  He was anticoagulated on warfarin due to his history of PAD however now that he is status post amputation, family is questioning whether he could be switched over to a direct oral anticoagulant.  Blood pressure is well controlled today.  10/17/2017  Mr. Hansman returns for follow-up.  He recently underwent a echocardiogram which demonstrated LVEF 60 to 65% and moderate aortic stenosis.  We will need to monitor this closely.  His daughter told me that he recently was admitted with acute renal failure, but that his symptoms improved and he did not require dialysis.  Nonetheless he is scheduled to get a fistula placed.  Based on this information he was taken off of Xarelto and placed back on warfarin.  We will continue to monitor that here.  I agree with warfarin in the setting of end-stage renal disease.  PMHx:  Past Medical History:  Diagnosis Date  . Anticoagulated on Coumadin   . Arthritis   . Calcified granuloma of lung ([place]) RIGHT LUNG BASE - STABLE PER CXR 03-03-2010  . Chronic back pain   . CKD (chronic kidney disease), stage III ([place])   . Coronary artery disease    a. 1997 PCI/BMS to the RCA; b. 2006 Cath:  Occluded RCA with collat flow, EF 60%, otw nonobs dzs; b. 2010 MV: No ischemia; c. 06/2013 MV: low risk w/o ischemia.  . Diabetes mellitus without complication ([place])   . Falls   . GERD (gastroesophageal reflux disease)   . Heart murmur   . History of DVT (deep vein thrombosis)    a. Early 2000's.  Marland Kitchen History of pneumonia 01/2013  . Hyperlipidemia   . Hypertensive heart disease   . Impaired hearing BILATERAL AIDS  .  Low back pain radiating to left leg   . Mitral regurgitation   . Nocturia   . Numbness and tingling of left leg   . PAF (paroxysmal atrial fibrillation) (HCC)    a. CHA2DS2VASc = 5-->chronic coumadin.  . Prostate cancer ([place]) RECUR   a. 2006 S/P radioactive seed implant.  . Sciatic nerve pain LEFT LEG PAIN--  EPI INJECTIONS  . Urgency of urination     Past Surgical History:  Procedure Laterality Date  . BACK SURGERY    . CARDIAC CATHETERIZATION  1998   IN-STENT RESTENOSIS-- LARGE COLLATERALS  . CARDIAC CATHETERIZATION  2003  &  2006   2006 ---  MITRAL REGURG/ 100% OCCULSION RCA WITH LARGE COLLATERALS/ MILD DISEASE LAD & CIRCUMFLEX  . CARDIOVASCULAR STRESS TEST  06-03-2008   NORMAL STUDY/ LV NORMAL / COMPARED TO 12-26-2000 INFERIOR WALL ISCHEMIA IS NO LONGER PRESENT.  Marland Kitchen CARDIOVERSION  09-15-2005   UNSUCCESSFUL  . CORONARY ANGIOPLASTY WITH STENT PLACEMENT  1997- DR AL LITTLE   X1 STENT RCA  . CRYOABLATION  03/01/2011   Procedure: CRYO ABLATION PROSTATE;  Surgeon: Ailene Rud, MD;  [facility];  Service: Urology;  Laterality: N/A;  . EXCISION BENIGN BREAST MASS  08-06-2008   LEFT  . JOINT REPLACEMENT    . LAMINECTOMY  10-28-2008   L3 - 4  . LUMBAR LAMINECTOMY/DECOMPRESSION MICRODISCECTOMY Right 04/05/2013   Procedure: Right Lumbar three-four Laminectomy ;  Surgeon: Charlie Pitter, MD;  Location: [place];  Service: Neurosurgery;  Laterality: Right;  . RADIOACTIVE SEED IMPLANT  08-03-2004   PROSTATE  . TOTAL HIP ARTHROPLASTY   11/02/2011   Procedure: TOTAL HIP ARTHROPLASTY;  Surgeon: Garald Balding, MD;  Location: [facility];  Service: Orthopedics;  Laterality: Left;  Left Total Hip Replacement  . TOTAL HIP ARTHROPLASTY Right 06/26/2013   Procedure: TOTAL HIP ARTHROPLASTY;  Surgeon: Garald Balding, MD;  Location: [facility];  Service: Orthopedics;  Laterality: Right;  . TRANSTHORACIC ECHOCARDIOGRAM  07-26-2007   LVSF NORMAL/ EF 55%/ LEFT ATRIUM 4.5CM MILD TO MOD. DILATED/ MILD  MITRAL & TRICUSPID REGURG    FAMHx:  Family History  Problem Relation Age of Onset  . Arthritis Mother   . Other Mother 67  . Heart Problems Father   . Heart disease Brother   . Breast cancer Sister   . Cancer Sister   . Heart Problems Brother     SOCHx:   reports that he quit smoking about 45 years ago. His smoking use included cigarettes and pipe. He has a 25.00 pack-year smoking history. He has never used smokeless tobacco. He reports that he does not drink alcohol or use drugs.  ALLERGIES:  Allergies  Allergen Reactions  . Bicalutamide Other (See Comments)    Casodex caused increase in breast size  . Codeine Itching and Rash  . Niacin And Related Other (See Comments)    Burning and stinging from tablets (otc capsules ok)  . Lovastatin Other (See Comments)    Leg weakness  . Beef Extract Rash  . Pravastatin Other (See Comments)    Myalgia    ROS: Pertinent items noted in HPI and remainder of comprehensive ROS otherwise negative.  HOME MEDS: Current Outpatient Medications  Medication Sig Dispense Refill  . aspirin EC 81 MG tablet Take 81 mg by mouth daily.     . B Complex Vitamins (VITAMIN B COMPLEX PO) Take 1 tablet by mouth daily.     . bicalutamide (CASODEX)  50 MG tablet Take 50 mg by mouth daily.    . Cholecalciferol (VITAMIN D3) 5000 units CAPS Take 5,000 Units by mouth daily.    Marland Kitchen diltiazem (DILTIAZEM CD) 180 MG 24 hr capsule Take 1 capsule (180 mg total) by mouth daily. 90 capsule 3  . docusate sodium (COLACE)  100 MG capsule Take 1 capsule (100 mg total) by mouth 2 (two) times daily as needed for mild constipation. 30 capsule 0  . Evolocumab with Infusor ([place]) 420 MG/3.5ML SOCT Inject 420 mg into the skin every 30 (thirty) days.    . furosemide (LASIX) 40 MG tablet TAKE 1 TABLET BY MOUTH TWO  TIMES DAILY 180 tablet 3  . gabapentin (NEURONTIN) 100 MG capsule Take 200 mg by mouth at bedtime.     . isosorbide mononitrate (IMDUR) 60 MG 24 hr tablet Take 60 mg by mouth daily.    . metoprolol succinate (TOPROL-XL) 100 MG 24 hr tablet Take 1 tablet (100 mg total) by mouth daily. Take with or immediately following a meal. 90 tablet 3  . metoprolol succinate (TOPROL-XL) 25 MG 24 hr tablet TAKE 3 TABLETS BY MOUTH  DAILY WITH OR IMMEDIATELY  FOLLOWING A MEAL 270 tablet 3  . Multiple Vitamins-Minerals (MULTIVITAMIN WITH MINERALS) tablet Take 1 tablet by mouth daily.    . nitroGLYCERIN (NITROSTAT) 0.4 MG SL tablet Place 1 tablet (0.4 mg total) under the tongue every 5 (five) minutes as needed for chest pain. 25 tablet PRN  . ranitidine (ZANTAC) 150 MG tablet Take 150 mg by mouth daily.    . ranolazine (RANEXA) 500 MG 12 hr tablet Take 1 tablet (500 mg total) by mouth 2 (two) times daily. 60 tablet 5  . Tamsulosin HCl (FLOMAX) 0.4 MG CAPS Take 0.4 mg by mouth daily after supper.    . vitamin C (ASCORBIC ACID) 500 MG tablet Take 500 mg by mouth daily.    Marland Kitchen warfarin (COUMADIN) 5 MG tablet Take 1/2 to 1 tablet by mouth daily as directed 90 tablet 0   No current facility-administered medications for this visit.     LABS/IMAGING: No results found for this or any previous visit (from the past 48 hour(s)). No results found.  VITALS: BP (!) 98/50   Pulse 95   Ht 5\' 9"  (1.753 m)   Wt 170 lb (77.1 kg)   SpO2 97%   BMI 25.10 kg/m   EXAM: General appearance: alert, appears older than stated age and no distress Neck: no carotid bruit, no JVD and thyroid not enlarged, symmetric, no  tenderness/mass/nodules Lungs: clear to auscultation bilaterally Heart: irregularly irregular rhythm, S1, S2 normal and systolic murmur: late systolic 3/6, crescendo at 2nd right intercostal space Abdomen: soft, non-tender; bowel sounds normal; no masses,  no organomegaly Extremities: Status post left BKA Pulses: 2+ and symmetric Skin: Skin color, texture, turgor normal. No rashes or lesions Neurologic: Grossly normal Psych: Pleasant  EKG: Deferred  ASSESSMENT: 1. Chronic systolic congestive heart failure - LVEF decreased to 45% (global HK) 03/2016 2. PAD with left BKA 3. Dizziness, imbalance, frequent falls 4. Permanent atrial fibrillation - CHADSVASC score of 5 (anticoagulated on warfarin) 5. Coronary artery disease with occluded right coronary left to right collaterals, EF 50% 6. Moderate aortic stenosis. 7. Recent pneumonia 8. Hypertension 9. Dyslipidemia 10. Recent fall with vertebral compression fracture  PLAN: 1.   Mr. Cott was apparently recently admitted with worsening renal failure.  He is now scheduled to have an AV fistula placed.  Based on this he is back on warfarin we will continue to monitor his INRs.  He is currently holding the warfarin for fistula placement.  Blood pressure is low today.  I think we can further decrease his diltiazem to 120 mg daily to see if this improves blood pressure as he has a history of dizziness, imbalance and frequent falls.  Follow-up in 6 months.  Pixie Casino, MD, [facility], [place] Director of the [facility]  Card[facility] Attending Cardiologist  Direct Dial: [phone]  Fax: [phone]  Website:  [email or web address] 10/17/2017, 11:11 AM

## 2017-10-17 NOTE — Patient Instructions (Signed)
Medication Instructions:  DECREASE diltiazem to 120mg  daily  If you need a refill on your cardiac medications before your next appointment, please call your pharmacy.    Follow-Up: At [facility], you and your health needs are our priority.  As part of our continuing mission to provide you with exceptional heart care, we have created designated Provider Care Teams.  These Care Teams include your primary Cardiologist (physician) and Advanced Practice Providers (APPs -  Physician Assistants and Nurse Practitioners) who all work together to provide you with the care you need, when you need it. You will need a follow up appointment in 6 months.  Please call our office 2 months in advance to schedule this appointment.  You may see Dr. Debara Pickett or one of the following Advanced Practice Providers on your designated Care Team: Almyra Deforest, [place] . Fabian Sharp, PA-C  Any Other Special Instructions Will Be Listed Below (If Applicable).

## 2017-10-28 ENCOUNTER — Ambulatory Visit (INDEPENDENT_AMBULATORY_CARE_PROVIDER_SITE_OTHER): Payer: Medicare Other | Admitting: Pharmacist Clinician (PhC)/ Clinical Pharmacy Specialist

## 2017-10-28 DIAGNOSIS — I482 Chronic atrial fibrillation, unspecified: Secondary | ICD-10-CM | POA: Diagnosis not present

## 2017-10-28 DIAGNOSIS — Z7901 Long term (current) use of anticoagulants: Secondary | ICD-10-CM | POA: Diagnosis not present

## 2017-10-28 LAB — POCT INR: INR: 2.4 (ref 2.0–3.0)

## 2017-11-21 DIAGNOSIS — R972 Elevated prostate specific antigen [PSA]: Secondary | ICD-10-CM

## 2017-11-25 ENCOUNTER — Ambulatory Visit (INDEPENDENT_AMBULATORY_CARE_PROVIDER_SITE_OTHER): Payer: Medicare Other | Admitting: Pharmacist

## 2017-11-25 DIAGNOSIS — I482 Chronic atrial fibrillation, unspecified: Secondary | ICD-10-CM | POA: Diagnosis not present

## 2017-11-25 DIAGNOSIS — Z7901 Long term (current) use of anticoagulants: Secondary | ICD-10-CM

## 2017-11-25 LAB — POCT INR: INR: 4.8 — AB (ref 2.0–3.0)

## 2017-11-30 DIAGNOSIS — C7951 Secondary malignant neoplasm of bone: Secondary | ICD-10-CM | POA: Diagnosis not present

## 2017-11-30 DIAGNOSIS — C772 Secondary and unspecified malignant neoplasm of intra-abdominal lymph nodes: Secondary | ICD-10-CM

## 2017-11-30 DIAGNOSIS — R972 Elevated prostate specific antigen [PSA]: Secondary | ICD-10-CM

## 2017-11-30 DIAGNOSIS — Z79899 Other long term (current) drug therapy: Secondary | ICD-10-CM

## 2017-11-30 DIAGNOSIS — C61 Malignant neoplasm of prostate: Secondary | ICD-10-CM

## 2017-11-30 DIAGNOSIS — D631 Anemia in chronic kidney disease: Secondary | ICD-10-CM

## 2017-11-30 DIAGNOSIS — N189 Chronic kidney disease, unspecified: Secondary | ICD-10-CM

## 2017-12-01 ENCOUNTER — Ambulatory Visit (INDEPENDENT_AMBULATORY_CARE_PROVIDER_SITE_OTHER): Payer: Medicare Other | Admitting: Pharmacist Clinician (PhC)/ Clinical Pharmacy Specialist

## 2017-12-01 DIAGNOSIS — I482 Chronic atrial fibrillation, unspecified: Secondary | ICD-10-CM | POA: Diagnosis not present

## 2017-12-01 DIAGNOSIS — Z7901 Long term (current) use of anticoagulants: Secondary | ICD-10-CM

## 2017-12-01 LAB — POCT INR: INR: 2.6 (ref 2.0–3.0)

## 2017-12-01 NOTE — Patient Instructions (Signed)
Description   Continue taking 1 tablet on Tuesday and 1/2 of a tablet on all other days. Repeat INR in 3 weeks

## 2017-12-11 DIAGNOSIS — Z9289 Personal history of other medical treatment: Secondary | ICD-10-CM

## 2017-12-11 HISTORY — DX: Personal history of other medical treatment: Z92.89

## 2017-12-11 HISTORY — PX: CORONARY ANGIOPLASTY WITH STENT PLACEMENT: SHX49

## 2017-12-14 ENCOUNTER — Telehealth: Payer: Self-pay | Admitting: Internal Medicine

## 2017-12-14 NOTE — Telephone Encounter (Signed)
New Message   Pt c/o medication issue:  1. Name of Medication: Plavix  2. How are you currently taking this medication (dosage and times per day)?   3. Are you having a reaction (difficulty breathing--STAT)?   4. What is your medication issue? Greg Booker is calling on behalf of patient. He just recently had a heart cath and was told to start Plavix but the pharmacist said that it will interfere with the coumadin. Please discuss.

## 2017-12-14 NOTE — Telephone Encounter (Signed)
Agree with recommendation given to patient. Clopidogrel and warfarin will be use together.   Will answer any additional questions during f/u coumadin visit.

## 2017-12-14 NOTE — Telephone Encounter (Signed)
Spoke with patient's daughter. She states patient had LHC on Monday 12/2 @ [facility] and got a stent (records not loaded into care everywhere at time of call). She states the cardiologist put him on plavix and the pharmacist told them he cannot take this with warfarin so they were concerned he would need to stop warfarin. Explained to daughter that plavix is an antiplatelet used for arterial disease + stents and warfarin is an anticoagulant or blood thinner, and he is on this for afib. Explained that these medications together can increase his bleeding risk. She will see about getting records sent to Dr. Debara Pickett.   Routed to MD & CVRR as Juluis Rainier

## 2017-12-14 NOTE — Telephone Encounter (Signed)
Thanks for the update

## 2017-12-30 DIAGNOSIS — D631 Anemia in chronic kidney disease: Secondary | ICD-10-CM | POA: Diagnosis not present

## 2017-12-30 DIAGNOSIS — R972 Elevated prostate specific antigen [PSA]: Secondary | ICD-10-CM

## 2017-12-30 DIAGNOSIS — N189 Chronic kidney disease, unspecified: Secondary | ICD-10-CM | POA: Diagnosis not present

## 2017-12-30 DIAGNOSIS — C7951 Secondary malignant neoplasm of bone: Secondary | ICD-10-CM | POA: Diagnosis not present

## 2017-12-30 DIAGNOSIS — C772 Secondary and unspecified malignant neoplasm of intra-abdominal lymph nodes: Secondary | ICD-10-CM

## 2017-12-30 DIAGNOSIS — C61 Malignant neoplasm of prostate: Secondary | ICD-10-CM | POA: Diagnosis not present

## 2018-01-02 ENCOUNTER — Telehealth: Payer: Self-pay | Admitting: Internal Medicine

## 2018-01-02 NOTE — Telephone Encounter (Signed)
Pt c/o swelling: STAT is pt has developed SOB within 24 hours  1) How much weight have you gained and in what time span? Last weighed 165 on Tuesday 12/31/17 patient weigh 171  2) If swelling, where is the swelling located? Foot  Stomach and neck area  3) Are you currently taking a fluid pill? No   4) Are you currently SOB? Yes   5) Do you have a log of your daily weights (if so, list)? Yes   6) Have you gained 3 pounds in a day or 5 pounds in a week? Yes   7) Have you traveled recently? No

## 2018-01-02 NOTE — Telephone Encounter (Signed)
Received a call from patient's care giver [name].She stated patient is currently at [facility].Stated patient has gained 5 lbs in 1 week.Stated he very sob.Increased swelling in abd,lower legs and feet.Requesting to be seen today.Advised he needs to go to [facility] ED.

## 2018-01-02 NOTE — Progress Notes (Signed)
Cardiology Office Note   Date:  27/06/2374   ID:  Nithin, Demeo [birth year], MRN [id number]  PCP:  Raina Mina., MD  Cardiologist:  Dr. Debara Pickett  No chief complaint on file.    History of Present Illness: Greg Booker is a 84 y.o. male who presents for ongoing assessment and management of CAD with most recent cath in 2006 revealed an occluded RCA with collaterals. Hx of DVT on coumadin  therapy.She is followed in our coumadin clinic and was last seen by them on 12/01/2017. Will be seen today.  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   He was last seem by Dr. Debara Pickett on 10/17/2017 for and had recent echocardiogram revealing a LVEF of 60%-65% and moderate Aortic stenosis. He had been admitted for acute renal failure which did not require dialysis. He was scheduled for a fistula placement.   Diltiazem was decreased to 120 mg daily to see if his BP was improved to avoid dizziness and imbalance and frequent falls.Marland Kitchen  He comes today clearly fluid overloaded with 2+3+ pitting edema with worsening cough and congestion. Skin splitting of the right lower leg with evidence of cellulitis.   Past Medical History:  Diagnosis Date  . Anticoagulated on Coumadin   . Arthritis   . Calcified granuloma of lung ([place]) RIGHT LUNG BASE - STABLE PER CXR 03-03-2010  . Chronic back pain   . CKD (chronic kidney disease), stage III ([place])   . Coronary artery disease    a. 1997 PCI/BMS to the RCA; b. 2006 Cath: Occluded RCA with collat flow, EF 60%, otw nonobs dzs; b. 2010 MV: No ischemia; c. 06/2013 MV: low risk w/o ischemia.  . Diabetes mellitus without complication ([place])   . Falls   . GERD (gastroesophageal reflux disease)   . Heart murmur   . History of DVT (deep vein thrombosis)    a. Early 2000's.  Marland Kitchen History of pneumonia 01/2013  . Hyperlipidemia   . Hypertensive heart disease   . Impaired hearing BILATERAL  AIDS  . Low back pain radiating to left leg   . Mitral regurgitation   . Nocturia   . Numbness and tingling of left leg   . PAF (paroxysmal atrial fibrillation) (HCC)    a. CHA2DS2VASc = 5-->chronic coumadin.  . Prostate cancer ([place]) RECUR   a. 2006 S/P radioactive seed implant.  . Sciatic nerve pain LEFT LEG PAIN--  EPI INJECTIONS  . Urgency of urination     Past Surgical History:  Procedure Laterality Date  . BACK SURGERY    . CARDIAC CATHETERIZATION  1998   IN-STENT RESTENOSIS-- LARGE COLLATERALS  . CARDIAC CATHETERIZATION  2003  &  2006   2006 ---  MITRAL REGURG/ 100% OCCULSION RCA WITH LARGE COLLATERALS/ MILD DISEASE LAD & CIRCUMFLEX  . CARDIOVASCULAR STRESS TEST  06-03-2008   NORMAL STUDY/ LV NORMAL / COMPARED TO 12-26-2000 INFERIOR WALL ISCHEMIA IS NO LONGER PRESENT.  Marland Kitchen CARDIOVERSION  09-15-2005   UNSUCCESSFUL  . CORONARY ANGIOPLASTY WITH STENT PLACEMENT  1997- DR AL LITTLE   X1 STENT RCA  . CRYOABLATION  03/01/2011   Procedure: CRYO ABLATION PROSTATE;  Surgeon: Ailene Rud, MD;  Location: [facility];  Service: Urology;  Laterality: N/A;  . EXCISION BENIGN BREAST MASS  08-06-2008   LEFT  . JOINT REPLACEMENT    . LAMINECTOMY  10-28-2008  L3 - 4  . LUMBAR LAMINECTOMY/DECOMPRESSION MICRODISCECTOMY Right 04/05/2013   Procedure: Right Lumbar three-four Laminectomy ;  Surgeon: Charlie Pitter, MD;  Location: [facility];  Service: Neurosurgery;  Laterality: Right;  . RADIOACTIVE SEED IMPLANT  08-03-2004   PROSTATE  . TOTAL HIP ARTHROPLASTY  11/02/2011   Procedure: TOTAL HIP ARTHROPLASTY;  Surgeon: Garald Balding, MD;  Location: [facility];  Service: Orthopedics;  Laterality: Left;  Left Total Hip Replacement  . TOTAL HIP ARTHROPLASTY Right 06/26/2013   Procedure: TOTAL HIP ARTHROPLASTY;  Surgeon: Garald Balding, MD;  Location: [facility] [facility];  Service: Orthopedics;  Laterality: Right;  . TRANSTHORACIC ECHOCARDIOGRAM  07-26-2007   LVSF NORMAL/ EF 55%/ LEFT  ATRIUM 4.5CM MILD TO MOD. DILATED/ MILD  MITRAL & TRICUSPID REGURG     Current Outpatient Medications  Medication Sig Dispense Refill  . aspirin EC 81 MG tablet Take 81 mg by mouth daily.     . B Complex Vitamins (VITAMIN B COMPLEX PO) Take 1 tablet by mouth daily.     . bicalutamide (CASODEX) 50 MG tablet Take 50 mg by mouth daily.    . Cholecalciferol (VITAMIN D3) 5000 units CAPS Take 5,000 Units by mouth daily.    . clopidogrel (PLAVIX) 75 MG tablet Take 75 mg by mouth daily.    Marland Kitchen diltiazem (DILACOR XR) 120 MG 24 hr capsule Take 1 capsule (120 mg total) by mouth daily. 90 capsule 3  . docusate sodium (COLACE) 100 MG capsule Take 1 capsule (100 mg total) by mouth 2 (two) times daily as needed for mild constipation. 30 capsule 0  . Evolocumab with Infusor (Hope) 420 MG/3.5ML SOCT Inject 420 mg into the skin every 30 (thirty) days.    . furosemide (LASIX) 40 MG tablet TAKE 1 TABLET BY MOUTH TWO  TIMES DAILY 180 tablet 3  . gabapentin (NEURONTIN) 100 MG capsule Take 200 mg by mouth at bedtime.     . isosorbide mononitrate (IMDUR) 60 MG 24 hr tablet Take 60 mg by mouth daily.    . metoprolol succinate (TOPROL-XL) 100 MG 24 hr tablet Take 1 tablet (100 mg total) by mouth daily. Take with or immediately following a meal. 90 tablet 3  . metoprolol succinate (TOPROL-XL) 25 MG 24 hr tablet TAKE 3 TABLETS BY MOUTH  DAILY WITH OR IMMEDIATELY  FOLLOWING A MEAL 270 tablet 3  . Multiple Vitamins-Minerals (MULTIVITAMIN WITH MINERALS) tablet Take 1 tablet by mouth daily.    . nitroGLYCERIN (NITROSTAT) 0.4 MG SL tablet Place 1 tablet (0.4 mg total) under the tongue every 5 (five) minutes as needed for chest pain. 25 tablet PRN  . ranitidine (ZANTAC) 150 MG tablet Take 150 mg by mouth daily.    . ranolazine (RANEXA) 500 MG 12 hr tablet Take 1 tablet (500 mg total) by mouth 2 (two) times daily. 60 tablet 5  . Tamsulosin HCl (FLOMAX) 0.4 MG CAPS Take 0.4 mg by mouth daily after supper.      . vitamin C (ASCORBIC ACID) 500 MG tablet Take 500 mg by mouth daily.    Marland Kitchen warfarin (COUMADIN) 5 MG tablet Take 1/2 to 1 tablet by mouth daily as directed 90 tablet 0   No current facility-administered medications for this visit.     Allergies:   Bicalutamide; Codeine; Niacin and related; Lovastatin; Beef extract; and Pravastatin    Social History:  The patient  reports that he quit smoking about 45 years ago. His smoking use included cigarettes and  pipe. He has a 25.00 pack-year smoking history. He has never used smokeless tobacco. He reports that he does not drink alcohol or use drugs.   Family History:  The patient's family history includes Arthritis in his mother; Breast cancer in his sister; Cancer in his sister; Heart Problems in his brother and father; Heart disease in his brother; Other (age of onset: 90) in his mother.    ROS: All other systems are reviewed and negative. Unless otherwise mentioned in H&P    PHYSICAL EXAM: VS:  There were no vitals taken for this visit. , BMI There is no height or weight on file to calculate BMI. GEN: Well nourished, well developed, in no acute distress HEENT: normal Neck: no JVD, carotid bruits, or masses Cardiac: IRRR; no murmurs, rubs, or gallops,no edema  Respiratory:  Clear to auscultation bilaterally, normal work of breathing GI: soft, nontender, nondistended, + BS MS: no deformity or atrophy Skin: warm and dry, no rash Neuro:  Strength and sensation are intact Psych: euthymic mood, full affect   EKG: Atrial fib rate of 94 bpm with anterolateral T-wave abnormality.   Recent Labs: No results found for requested labs within last 8760 hours.    Lipid Panel    Component Value Date/Time   CHOL 69 (L) 12/01/2016 1025   TRIG 64 12/01/2016 1025   HDL 32 (L) 12/01/2016 1025   CHOLHDL 2.2 12/01/2016 1025   LDLCALC 24 12/01/2016 1025      Wt Readings from Last 3 Encounters:  10/17/17 170 lb (77.1 kg)  09/27/17 175 lb (79.4 kg)   09/02/17 175 lb (79.4 kg)      Other studies Reviewed: See H&P   ASSESSMENT AND PLAN:  1. Decompensated Diastolic CHF-Admit.  See H&P.   Current medicines are reviewed at length with the patient today.     Phill Myron. West Pugh, ANP, AACC   01/02/2018 1:54 PM    [facility] [address] Office [phone] Fax [phone]

## 2018-01-02 NOTE — Telephone Encounter (Signed)
Thanks .. agree. We will look for him.  Dr Lemmie Evens

## 2018-01-03 ENCOUNTER — Encounter: Payer: Self-pay | Admitting: Adult Health

## 2018-01-03 ENCOUNTER — Other Ambulatory Visit: Payer: Self-pay

## 2018-01-03 ENCOUNTER — Inpatient Hospital Stay (HOSPITAL_COMMUNITY): Payer: Medicare Other

## 2018-01-03 ENCOUNTER — Ambulatory Visit (INDEPENDENT_AMBULATORY_CARE_PROVIDER_SITE_OTHER): Payer: Medicare Other | Admitting: Pharmacist

## 2018-01-03 ENCOUNTER — Ambulatory Visit: Payer: Medicare Other | Admitting: Adult Health

## 2018-01-03 ENCOUNTER — Encounter (HOSPITAL_COMMUNITY): Payer: Self-pay | Admitting: General Practice

## 2018-01-03 ENCOUNTER — Inpatient Hospital Stay (HOSPITAL_COMMUNITY)
Admission: AD | Admit: 2018-01-03 | Discharge: 2018-01-10 | DRG: 682 | Disposition: A | Discharge status: Home Health | Payer: Medicare Other | Attending: Internal Medicine | Admitting: Internal Medicine

## 2018-01-03 VITALS — BP 92/50 | HR 94 | Ht 69.0 in | Wt 172.3 lb

## 2018-01-03 DIAGNOSIS — I739 Peripheral vascular disease, unspecified: Secondary | ICD-10-CM | POA: Diagnosis not present

## 2018-01-03 DIAGNOSIS — I350 Nonrheumatic aortic (valve) stenosis: Secondary | ICD-10-CM | POA: Diagnosis not present

## 2018-01-03 DIAGNOSIS — I5023 Acute on chronic systolic (congestive) heart failure: Secondary | ICD-10-CM | POA: Diagnosis not present

## 2018-01-03 DIAGNOSIS — I4821 Permanent atrial fibrillation: Secondary | ICD-10-CM | POA: Diagnosis present

## 2018-01-03 DIAGNOSIS — I2510 Atherosclerotic heart disease of native coronary artery without angina pectoris: Secondary | ICD-10-CM | POA: Diagnosis present

## 2018-01-03 DIAGNOSIS — Z955 Presence of coronary angioplasty implant and graft: Secondary | ICD-10-CM | POA: Diagnosis not present

## 2018-01-03 DIAGNOSIS — E785 Hyperlipidemia, unspecified: Secondary | ICD-10-CM | POA: Diagnosis not present

## 2018-01-03 DIAGNOSIS — G8929 Other chronic pain: Secondary | ICD-10-CM | POA: Diagnosis present

## 2018-01-03 DIAGNOSIS — E876 Hypokalemia: Secondary | ICD-10-CM | POA: Diagnosis not present

## 2018-01-03 DIAGNOSIS — K219 Gastro-esophageal reflux disease without esophagitis: Secondary | ICD-10-CM | POA: Diagnosis present

## 2018-01-03 DIAGNOSIS — I252 Old myocardial infarction: Secondary | ICD-10-CM

## 2018-01-03 DIAGNOSIS — E1151 Type 2 diabetes mellitus with diabetic peripheral angiopathy without gangrene: Secondary | ICD-10-CM | POA: Diagnosis present

## 2018-01-03 DIAGNOSIS — N183 Chronic kidney disease, stage 3 (moderate): Secondary | ICD-10-CM | POA: Diagnosis not present

## 2018-01-03 DIAGNOSIS — Z7189 Other specified counseling: Secondary | ICD-10-CM

## 2018-01-03 DIAGNOSIS — T502X5A Adverse effect of carbonic-anhydrase inhibitors, benzothiadiazides and other diuretics, initial encounter: Secondary | ICD-10-CM | POA: Diagnosis not present

## 2018-01-03 DIAGNOSIS — Z66 Do not resuscitate: Secondary | ICD-10-CM | POA: Diagnosis present

## 2018-01-03 DIAGNOSIS — R319 Hematuria, unspecified: Secondary | ICD-10-CM | POA: Diagnosis not present

## 2018-01-03 DIAGNOSIS — I080 Rheumatic disorders of both mitral and aortic valves: Secondary | ICD-10-CM | POA: Diagnosis present

## 2018-01-03 DIAGNOSIS — Z7982 Long term (current) use of aspirin: Secondary | ICD-10-CM

## 2018-01-03 DIAGNOSIS — L89613 Pressure ulcer of right heel, stage 3: Secondary | ICD-10-CM | POA: Diagnosis present

## 2018-01-03 DIAGNOSIS — I2582 Chronic total occlusion of coronary artery: Secondary | ICD-10-CM | POA: Diagnosis present

## 2018-01-03 DIAGNOSIS — C61 Malignant neoplasm of prostate: Secondary | ICD-10-CM | POA: Diagnosis present

## 2018-01-03 DIAGNOSIS — D631 Anemia in chronic kidney disease: Secondary | ICD-10-CM | POA: Diagnosis present

## 2018-01-03 DIAGNOSIS — Z86718 Personal history of other venous thrombosis and embolism: Secondary | ICD-10-CM | POA: Diagnosis not present

## 2018-01-03 DIAGNOSIS — E1159 Type 2 diabetes mellitus with other circulatory complications: Secondary | ICD-10-CM | POA: Diagnosis not present

## 2018-01-03 DIAGNOSIS — N170 Acute kidney failure with tubular necrosis: Secondary | ICD-10-CM | POA: Diagnosis not present

## 2018-01-03 DIAGNOSIS — Z9111 Patient's noncompliance with dietary regimen: Secondary | ICD-10-CM

## 2018-01-03 DIAGNOSIS — I5043 Acute on chronic combined systolic (congestive) and diastolic (congestive) heart failure: Secondary | ICD-10-CM | POA: Diagnosis present

## 2018-01-03 DIAGNOSIS — H9190 Unspecified hearing loss, unspecified ear: Secondary | ICD-10-CM | POA: Diagnosis present

## 2018-01-03 DIAGNOSIS — N184 Chronic kidney disease, stage 4 (severe): Secondary | ICD-10-CM | POA: Diagnosis not present

## 2018-01-03 DIAGNOSIS — Z7901 Long term (current) use of anticoagulants: Secondary | ICD-10-CM

## 2018-01-03 DIAGNOSIS — I509 Heart failure, unspecified: Secondary | ICD-10-CM

## 2018-01-03 DIAGNOSIS — Z91018 Allergy to other foods: Secondary | ICD-10-CM

## 2018-01-03 DIAGNOSIS — I4891 Unspecified atrial fibrillation: Secondary | ICD-10-CM | POA: Diagnosis present

## 2018-01-03 DIAGNOSIS — Z515 Encounter for palliative care: Secondary | ICD-10-CM | POA: Diagnosis not present

## 2018-01-03 DIAGNOSIS — D649 Anemia, unspecified: Secondary | ICD-10-CM | POA: Diagnosis present

## 2018-01-03 DIAGNOSIS — I10 Essential (primary) hypertension: Secondary | ICD-10-CM | POA: Diagnosis not present

## 2018-01-03 DIAGNOSIS — I132 Hypertensive heart and chronic kidney disease with heart failure and with stage 5 chronic kidney disease, or end stage renal disease: Secondary | ICD-10-CM | POA: Diagnosis present

## 2018-01-03 DIAGNOSIS — Z96643 Presence of artificial hip joint, bilateral: Secondary | ICD-10-CM | POA: Diagnosis present

## 2018-01-03 DIAGNOSIS — I482 Chronic atrial fibrillation, unspecified: Secondary | ICD-10-CM

## 2018-01-03 DIAGNOSIS — D508 Other iron deficiency anemias: Secondary | ICD-10-CM | POA: Diagnosis not present

## 2018-01-03 DIAGNOSIS — Z79899 Other long term (current) drug therapy: Secondary | ICD-10-CM

## 2018-01-03 DIAGNOSIS — E119 Type 2 diabetes mellitus without complications: Secondary | ICD-10-CM

## 2018-01-03 DIAGNOSIS — E1122 Type 2 diabetes mellitus with diabetic chronic kidney disease: Secondary | ICD-10-CM | POA: Diagnosis present

## 2018-01-03 DIAGNOSIS — N179 Acute kidney failure, unspecified: Principal | ICD-10-CM | POA: Diagnosis present

## 2018-01-03 DIAGNOSIS — N185 Chronic kidney disease, stage 5: Secondary | ICD-10-CM | POA: Diagnosis present

## 2018-01-03 DIAGNOSIS — N189 Chronic kidney disease, unspecified: Secondary | ICD-10-CM | POA: Diagnosis present

## 2018-01-03 DIAGNOSIS — Z89422 Acquired absence of other left toe(s): Secondary | ICD-10-CM

## 2018-01-03 DIAGNOSIS — Z8249 Family history of ischemic heart disease and other diseases of the circulatory system: Secondary | ICD-10-CM

## 2018-01-03 DIAGNOSIS — Z7902 Long term (current) use of antithrombotics/antiplatelets: Secondary | ICD-10-CM

## 2018-01-03 DIAGNOSIS — Z87891 Personal history of nicotine dependence: Secondary | ICD-10-CM

## 2018-01-03 DIAGNOSIS — Z888 Allergy status to other drugs, medicaments and biological substances status: Secondary | ICD-10-CM

## 2018-01-03 DIAGNOSIS — L8990 Pressure ulcer of unspecified site, unspecified stage: Secondary | ICD-10-CM

## 2018-01-03 DIAGNOSIS — I959 Hypotension, unspecified: Secondary | ICD-10-CM | POA: Diagnosis not present

## 2018-01-03 DIAGNOSIS — I5032 Chronic diastolic (congestive) heart failure: Secondary | ICD-10-CM | POA: Diagnosis not present

## 2018-01-03 DIAGNOSIS — Z885 Allergy status to narcotic agent status: Secondary | ICD-10-CM

## 2018-01-03 HISTORY — DX: Dorsalgia, unspecified: M54.9

## 2018-01-03 HISTORY — DX: Non-pressure chronic ulcer of unspecified part of unspecified lower leg with unspecified severity: L97.909

## 2018-01-03 HISTORY — DX: Type 2 diabetes mellitus without complications: E11.9

## 2018-01-03 HISTORY — DX: Anxiety disorder, unspecified: F41.9

## 2018-01-03 HISTORY — DX: Other chronic pain: G89.29

## 2018-01-03 HISTORY — DX: Personal history of other medical treatment: Z92.89

## 2018-01-03 HISTORY — DX: Non-pressure chronic ulcer of unspecified part of unspecified lower leg with unspecified severity: I73.9

## 2018-01-03 HISTORY — DX: Chronic obstructive pulmonary disease, unspecified: J44.9

## 2018-01-03 HISTORY — DX: Pain in thoracic spine: M54.6

## 2018-01-03 LAB — GLUCOSE, CAPILLARY
Glucose-Capillary: 124 mg/dL — ABNORMAL HIGH (ref 70–99)
Glucose-Capillary: 116 mg/dL — ABNORMAL HIGH (ref 70–99)

## 2018-01-03 LAB — COMPREHENSIVE METABOLIC PANEL
ALT: 14 U/L (ref 0–44)
ANION GAP: 13 (ref 5–15)
AST: 19 U/L (ref 15–41)
Albumin: 2.9 g/dL — ABNORMAL LOW (ref 3.5–5.0)
Alkaline Phosphatase: 50 U/L (ref 38–126)
BUN: 64 mg/dL — ABNORMAL HIGH (ref 8–23)
CO2: 25 mmol/L (ref 22–32)
Calcium: 8.9 mg/dL (ref 8.9–10.3)
Chloride: 99 mmol/L (ref 98–111)
Creatinine, Ser: 4.07 mg/dL — ABNORMAL HIGH (ref 0.61–1.24)
GFR calc Af Amer: 14 mL/min — ABNORMAL LOW (ref >60)
GFR calc non Af Amer: 12 mL/min — ABNORMAL LOW (ref >60)
Glucose, Bld: 129 mg/dL — ABNORMAL HIGH (ref 70–99)
Potassium: 3.9 mmol/L (ref 3.5–5.1)
Sodium: 137 mmol/L (ref 135–145)
Total Bilirubin: 0.8 mg/dL (ref 0.3–1.2)
Total Protein: 6.7 g/dL (ref 6.5–8.1)

## 2018-01-03 LAB — CBC WITH DIFFERENTIAL/PLATELET
Abs Immature Granulocytes: 0.09 10*3/uL — ABNORMAL HIGH (ref 0.00–0.07)
Basophils Absolute: 0 10*3/uL (ref 0.0–0.1)
Basophils Relative: 0 %
EOS PCT: 0 %
Eosinophils Absolute: 0.1 10*3/uL (ref 0.0–0.5)
HCT: 21.9 % — ABNORMAL LOW (ref 39.0–52.0)
Hemoglobin: 7 g/dL — ABNORMAL LOW (ref 13.0–17.0)
Immature Granulocytes: 1 %
Lymphocytes Relative: 8 %
Lymphs Abs: 1 10*3/uL (ref 0.7–4.0)
MCH: 32.9 pg (ref 26.0–34.0)
MCHC: 32 g/dL (ref 30.0–36.0)
MCV: 102.8 fL — ABNORMAL HIGH (ref 80.0–100.0)
Monocytes Absolute: 0.8 10*3/uL (ref 0.1–1.0)
Monocytes Relative: 6 %
Neutro Abs: 10.6 10*3/uL — ABNORMAL HIGH (ref 1.7–7.7)
Neutrophils Relative %: 85 %
Platelets: 282 10*3/uL (ref 150–400)
RBC: 2.13 MIL/uL — ABNORMAL LOW (ref 4.22–5.81)
RDW: 18.2 % — AB (ref 11.5–15.5)
WBC: 12.5 10*3/uL — ABNORMAL HIGH (ref 4.0–10.5)
nRBC: 0.2 % (ref 0.0–0.2)

## 2018-01-03 LAB — BRAIN NATRIURETIC PEPTIDE: B Natriuretic Peptide: 1394.2 pg/mL — ABNORMAL HIGH (ref 0.0–100.0)

## 2018-01-03 LAB — IRON AND TIBC
IRON: 80 ug/dL (ref 45–182)
Saturation Ratios: 35 % (ref 17.9–39.5)
TIBC: 231 ug/dL — ABNORMAL LOW (ref 250–450)
UIBC: 151 ug/dL

## 2018-01-03 LAB — PREPARE RBC (CROSSMATCH)

## 2018-01-03 LAB — POCT INR: INR: 2.8 (ref 2.0–3.0)

## 2018-01-03 MED ORDER — SODIUM CHLORIDE 0.9% IV SOLUTION: Freq: Once | INTRAVENOUS | Status: DC

## 2018-01-03 MED ORDER — DILTIAZEM HCL ER COATED BEADS 120 MG PO CP24: 120.0000 mg | ORAL_CAPSULE | Freq: Every day | ORAL | Status: DC

## 2018-01-03 MED ORDER — TAMSULOSIN HCL 0.4 MG PO CAPS: 0.4000 mg | ORAL_CAPSULE | Freq: Every day | ORAL | Status: DC

## 2018-01-03 MED ORDER — FUROSEMIDE 10 MG/ML IJ SOLN
60.0000 mg | Freq: Three times a day (TID) | INTRAMUSCULAR | Status: DC
  Administered 2018-01-03 – 2018-01-07 (×12): 60 mg via INTRAVENOUS
  Filled 2018-01-03 (×13): qty 6

## 2018-01-03 MED ORDER — METOPROLOL SUCCINATE ER 100 MG PO TB24: 100.0000 mg | ORAL_TABLET | Freq: Every day | ORAL | Status: DC

## 2018-01-03 MED ORDER — CLOPIDOGREL BISULFATE 75 MG PO TABS
75.0000 mg | ORAL_TABLET | Freq: Every day | ORAL | Status: DC
  Administered 2018-01-04 – 2018-01-10 (×7): 75 mg via ORAL
  Filled 2018-01-03 (×7): qty 1

## 2018-01-03 MED ORDER — INSULIN ASPART 100 UNIT/ML ~~LOC~~ SOLN
0.0000 [IU] | Freq: Three times a day (TID) | SUBCUTANEOUS | Status: DC
  Administered 2018-01-04 (×2): 1 [IU] via SUBCUTANEOUS
  Administered 2018-01-05: 2 [IU] via SUBCUTANEOUS
  Administered 2018-01-05 – 2018-01-09 (×12): 1 [IU] via SUBCUTANEOUS
  Administered 2018-01-10: 2 [IU] via SUBCUTANEOUS
  Administered 2018-01-10: 1 [IU] via SUBCUTANEOUS

## 2018-01-03 MED ORDER — ENOXAPARIN SODIUM 30 MG/0.3ML ~~LOC~~ SOLN: 30.0000 mg | SUBCUTANEOUS | Status: DC

## 2018-01-03 MED ORDER — WARFARIN SODIUM 2.5 MG PO TABS
2.5000 mg | ORAL_TABLET | Freq: Once | ORAL | Status: AC
  Administered 2018-01-03: 2.5 mg via ORAL
  Filled 2018-01-03: qty 1

## 2018-01-03 MED ORDER — SENNOSIDES-DOCUSATE SODIUM 8.6-50 MG PO TABS
1.0000 | ORAL_TABLET | Freq: Every day | ORAL | Status: DC
  Administered 2018-01-03 – 2018-01-04 (×2): 1 via ORAL
  Filled 2018-01-03 (×2): qty 1

## 2018-01-03 MED ORDER — SODIUM CHLORIDE 0.9% FLUSH: 3.0000 mL | INTRAVENOUS | Status: DC | PRN

## 2018-01-03 MED ORDER — SODIUM CHLORIDE 0.9% FLUSH
3.0000 mL | Freq: Two times a day (BID) | INTRAVENOUS | Status: DC
  Administered 2018-01-03 – 2018-01-10 (×8): 3 mL via INTRAVENOUS

## 2018-01-03 MED ORDER — SODIUM CHLORIDE 0.9 % IV SOLN: 250.0000 mL | INTRAVENOUS | Status: DC | PRN

## 2018-01-03 MED ORDER — ACETAMINOPHEN 325 MG PO TABS: 650.0000 mg | ORAL_TABLET | ORAL | Status: DC | PRN

## 2018-01-03 MED ORDER — GABAPENTIN 100 MG PO CAPS
100.0000 mg | ORAL_CAPSULE | Freq: Every day | ORAL | Status: DC
  Administered 2018-01-03 – 2018-01-09 (×7): 100 mg via ORAL
  Filled 2018-01-03 (×7): qty 1

## 2018-01-03 MED ORDER — WARFARIN SODIUM 5 MG PO TABS: 5.0000 mg | ORAL_TABLET | Freq: Every day | ORAL | Status: DC

## 2018-01-03 MED ORDER — ONDANSETRON HCL 4 MG/2ML IJ SOLN: 4.0000 mg | Freq: Four times a day (QID) | INTRAMUSCULAR | Status: DC | PRN

## 2018-01-03 MED ORDER — RANOLAZINE ER 500 MG PO TB12: 500.0000 mg | ORAL_TABLET | Freq: Two times a day (BID) | ORAL | Status: DC

## 2018-01-03 MED ORDER — ISOSORBIDE MONONITRATE ER 30 MG PO TB24: 30.0000 mg | ORAL_TABLET | Freq: Every day | ORAL | Status: DC

## 2018-01-03 MED ORDER — WARFARIN - PHARMACIST DOSING INPATIENT
Freq: Every day | Status: DC
  Administered 2018-01-04 – 2018-01-09 (×2)

## 2018-01-03 NOTE — Progress Notes (Signed)
ANTICOAGULATION CONSULT NOTE - Initial Consult  Pharmacy Consult for Coumadin Indication: atrial fibrillation  Allergies  Allergen Reactions  . Bicalutamide Other (See Comments)    Casodex caused increase in breast size  . Codeine Itching and Rash  . Niacin And Related Other (See Comments)    Burning and stinging from tablets (otc capsules ok)  . Lovastatin Other (See Comments)    Leg weakness  . Morphine Other (See Comments)    Unknown reaction  . Beef Extract Rash  . Pravastatin Other (See Comments)    Myalgia    Patient Measurements: Height: 5\' 9"  (175.3 cm) Weight: 171 lb 1.2 oz (77.6 kg) IBW/kg (Calculated) : 70.7  Vital Signs: Temp: 98.7 F (37.1 C) (12/24 1259) Temp Source: Oral (12/24 1259) BP: 96/59 (12/24 1300) Pulse Rate: 81 (12/24 1259)  Labs: Recent Labs    01/03/18 1041 01/03/18 1303  HGB  --  7.0*  HCT  --  21.9*  PLT  --  282  INR 2.8  --   CREATININE  --  4.07*    Estimated Creatinine Clearance: 13 mL/min (A) (by C-G formula based on SCr of 4.07 mg/dL (H)).   Medical History: Past Medical History:  Diagnosis Date  . Anticoagulated on Coumadin   . Arthritis   . Calcified granuloma of lung ([place]) RIGHT LUNG BASE - STABLE PER CXR 03-03-2010  . Chronic back pain   . CKD (chronic kidney disease), stage III ([place])   . Coronary artery disease    a. 1997 PCI/BMS to the RCA; b. 2006 Cath: Occluded RCA with collat flow, EF 60%, otw nonobs dzs; b. 2010 MV: No ischemia; c. 06/2013 MV: low risk w/o ischemia.  . Diabetes mellitus without complication ([place])   . Falls   . GERD (gastroesophageal reflux disease)   . Heart murmur   . History of DVT (deep vein thrombosis)    a. Early 2000's.  Marland Kitchen History of pneumonia 01/2013  . Hyperlipidemia   . Hypertensive heart disease   . Impaired hearing BILATERAL AIDS  . Low back pain radiating to left leg   . Mitral regurgitation   . Nocturia   . Numbness and tingling of left leg   . PAF (paroxysmal atrial  fibrillation) (HCC)    a. CHA2DS2VASc = 5-->chronic coumadin.  . Prostate cancer ([place]) RECUR   a. 2006 S/P radioactive seed implant.  . Sciatic nerve pain LEFT LEG PAIN--  EPI INJECTIONS  . Urgency of urination    Assessment:  84 yr old male admitted with fluid overload.  To continue Coumadin as prior to admission for atrial fibrillation and hx DVT (2013?).       Caregiver in room reports most recent Coumadin regimen is 2.5 mg daily except 5 mg on Tuesdays.  She also reports that Coumadin has been held x 2 days d/t INR 4.1 on 12/22 and 3.8 on 12/23 at [facility].  She also reports INR 3.1 this morning.  INR 2.8 at Cardiology office today. Also reports some recent oozing from RLE blister, which is wrapped.   Hx anemia, but Hgb down 8.1 and 1 unit PRBCs to be given today.  Expect INR to continue trend down in am, after holding doses x 2 days.  Goal of Therapy:  INR 2-3 Monitor platelets by anticoagulation protocol: Yes   Plan:   Coumadin 2.5 mg x 1 today rather than usual Tuesday dose of 5 mg.    Daily PT/INR.  Monitor for any bleeding.  Elana Alm,  Tobey Bride, [place] Pager: [phone] or phone: [phone] 01/03/2018,4:33 PM

## 2018-01-03 NOTE — Consult Note (Addendum)
Referring Provider: No ref. provider found Primary Care Physician:  Greg Booker., MD Primary Nephrologist: Dr. Neta Ehlers  Reason for Consultation: Chronic kidney disease stage V, anasarca with increasing peripheral edema, debility and weakness,  HPI: This very pleasant 84 year old gentleman who is here with his caregiver.  His wife died several years ago and he has no children he has a brother that is power of attorney.  He is seen by a nephrologist in [facility] and has an AV fistula that has been created in his left upper arm.  He has a history of hypertension chronic atrial fibrillation diet-controlled type 2 diabetes coronary artery disease status post stent placement prostate cancer, peripheral vascular disease with a recent left BKA.  He has stage IV/V chronic kidney disease with atherosclerotic renal vascular disease.  He had a recent hospitalization with acute kidney injury status post N STEMI and stent placement.  His fistula was created on 10/20/2017.  His creatinine on 12/13/2017 was 2.79.  After his hospitalization he was discharged 12/13/2017 to [place].  Admitted [facility] 11/23 to 12/06/2017 for N STEMI  Admitted 12/2 to 12/13/2017 for cardiac catheterization and stent placement to LAD.  According to caregiver since admission he has had failure to thrive poor appetite increasing lower extremity edema with some blisters, he is gained about 10 pounds of weight..  He has a sacral decubitus due to being nonambulatory increasing lethargy.  He was brought to his cardiologist appointment today for increasing lower extremity swelling.  It was decided to admit him directly to [facility].  Blood pressure 96/59 pulse 81 temperature 98.7 O2 sats 96% room air  Sodium 137 potassium 3.9 chloride 99 CO2 125 glucose 129 BUN 64 creatinine 4.07 calcium 8.9 alkaline phosphatase 50 albumin 2.9 AST 19 ALT 14 BNP 1394 WBC 12.5 hemoglobin 7.0 platelets 282 INR  2.8  He is lethargic history is from caregiver.  He is pale and has a very waxy appearance. " He states he does not want to die"    Medications include diltiazem 120 mg daily, metoprolol succinate 100 mg daily, Ranexa 500 mg twice daily, isosorbide mononitrate 30 mg daily.  Flomax 0.4 mg daily.  Last 2D echo showed a systolic function of 45 to 50% with grade 2 diastolic dysfunction in August.  In October repeat 2D echo showed an EF of 60 to 65%.  He had a moderately calcified aortic valve with mildly increased pulmonary pressures at 34 mmHg 08/2017.  He sees Dr. Debara Pickett who is his cardiologist    Past Medical History:  Diagnosis Date  . Anticoagulated on Coumadin   . Arthritis   . Calcified granuloma of lung ([place]) RIGHT LUNG BASE - STABLE PER CXR 03-03-2010  . Chronic back pain   . CKD (chronic kidney disease), stage III ([place])   . Coronary artery disease    a. 1997 PCI/BMS to the RCA; b. 2006 Cath: Occluded RCA with collat flow, EF 60%, otw nonobs dzs; b. 2010 MV: No ischemia; c. 06/2013 MV: low risk w/o ischemia.  . Diabetes mellitus without complication ([place])   . Falls   . GERD (gastroesophageal reflux disease)   . Heart murmur   . History of DVT (deep vein thrombosis)    a. Early 2000's.  Marland Kitchen History of pneumonia 01/2013  . Hyperlipidemia   . Hypertensive heart disease   . Impaired hearing BILATERAL AIDS  . Low back pain radiating to left leg   . Mitral  regurgitation   . Nocturia   . Numbness and tingling of left leg   . PAF (paroxysmal atrial fibrillation) (HCC)    a. CHA2DS2VASc = 5-->chronic coumadin.  . Prostate cancer ([place]) RECUR   a. 2006 S/P radioactive seed implant.  . Sciatic nerve pain LEFT LEG PAIN--  EPI INJECTIONS  . Urgency of urination     Past Surgical History:  Procedure Laterality Date  . BACK SURGERY    . CARDIAC CATHETERIZATION  1998   IN-STENT RESTENOSIS-- LARGE COLLATERALS  . CARDIAC CATHETERIZATION  2003  &  2006   2006 ---  MITRAL REGURG/ 100%  OCCULSION RCA WITH LARGE COLLATERALS/ MILD DISEASE LAD & CIRCUMFLEX  . CARDIOVASCULAR STRESS TEST  06-03-2008   NORMAL STUDY/ LV NORMAL / COMPARED TO 12-26-2000 INFERIOR WALL ISCHEMIA IS NO LONGER PRESENT.  Marland Kitchen CARDIOVERSION  09-15-2005   UNSUCCESSFUL  . CORONARY ANGIOPLASTY WITH STENT PLACEMENT  1997- DR AL LITTLE   X1 STENT RCA  . CRYOABLATION  03/01/2011   Procedure: CRYO ABLATION PROSTATE;  Surgeon: Ailene Rud, MD;  Location: [facility];  Service: Urology;  Laterality: N/A;  . EXCISION BENIGN BREAST MASS  08-06-2008   LEFT  . JOINT REPLACEMENT    . LAMINECTOMY  10-28-2008   L3 - 4  . LUMBAR LAMINECTOMY/DECOMPRESSION MICRODISCECTOMY Right 04/05/2013   Procedure: Right Lumbar three-four Laminectomy ;  Surgeon: Charlie Pitter, MD;  Location: [place] [facility]URO [facility];  Service: Neurosurgery;  Laterality: Right;  . RADIOACTIVE SEED IMPLANT  08-03-2004   PROSTATE  . TOTAL HIP ARTHROPLASTY  11/02/2011   Procedure: TOTAL HIP ARTHROPLASTY;  Surgeon: Garald Balding, MD;  Location: [facility];  Service: Orthopedics;  Laterality: Left;  Left Total Hip Replacement  . TOTAL HIP ARTHROPLASTY Right 06/26/2013   Procedure: TOTAL HIP ARTHROPLASTY;  Surgeon: Garald Balding, MD;  Location: [facility];  Service: Orthopedics;  Laterality: Right;  . TRANSTHORACIC ECHOCARDIOGRAM  07-26-2007   LVSF NORMAL/ EF 55%/ LEFT ATRIUM 4.5CM MILD TO MOD. DILATED/ MILD  MITRAL & TRICUSPID REGURG    Prior to Admission medications   Medication Sig Start Date End Date Taking? Authorizing Provider  aspirin EC 81 MG tablet Take 81 mg by mouth daily.     [provider]  B Complex Vitamins (VITAMIN B COMPLEX PO) Take 1 tablet by mouth daily.     [provider]  bicalutamide (CASODEX) 50 MG tablet Take 50 mg by mouth daily. 04/12/14   [provider]  cefdinir (OMNICEF) 300 MG capsule Take 300 mg by mouth 2 (two) times daily.    [provider]  Cholecalciferol (VITAMIN D3) 5000 units  CAPS Take 5,000 Units by mouth daily.    [provider]  clopidogrel (PLAVIX) 75 MG tablet Take 75 mg by mouth daily.    [provider]  diltiazem (DILACOR XR) 120 MG 24 hr capsule Take 1 capsule (120 mg total) by mouth daily. 10/17/17   Hilty, Nadean Corwin, MD  docusate sodium (COLACE) 100 MG capsule Take 1 capsule (100 mg total) by mouth 2 (two) times daily as needed for mild constipation. 05/07/16   Virgel Manifold, MD  Evolocumab with Infusor ([place]) 420 MG/3.5ML SOCT Inject 420 mg into the skin every 30 (thirty) days.    [provider]  furosemide (LASIX) 40 MG tablet TAKE 1 TABLET BY MOUTH TWO  TIMES DAILY 05/11/17   Troy Sine, MD  gabapentin (NEURONTIN) 100 MG capsule Take 200 mg  by mouth at bedtime.  03/19/16   [provider]  isosorbide mononitrate (IMDUR) 60 MG 24 hr tablet Take 60 mg by mouth daily.    [provider]  metoprolol tartrate (LOPRESSOR) 25 MG tablet Take 25 mg by mouth 2 (two) times daily.    [provider]  Multiple Vitamins-Minerals (MULTIVITAMIN WITH MINERALS) tablet Take 1 tablet by mouth daily.    [provider]  nitroGLYCERIN (NITROSTAT) 0.4 MG SL tablet Place 1 tablet (0.4 mg total) under the tongue every 5 (five) minutes as needed for chest pain. 10/20/16   Almyra Deforest, PA  ranitidine (ZANTAC) 150 MG tablet Take 150 mg by mouth daily.    [provider]  ranolazine (RANEXA) 500 MG 12 hr tablet Take 1 tablet (500 mg total) by mouth 2 (two) times daily. 10/14/15   Erlene Quan, PA-C  Tamsulosin HCl (FLOMAX) 0.4 MG CAPS Take 0.4 mg by mouth daily after supper.    [provider]  vitamin C (ASCORBIC ACID) 500 MG tablet Take 500 mg by mouth daily.    [provider]  warfarin (COUMADIN) 5 MG tablet Take 1/2 to 1 tablet by mouth daily as directed 10/10/17   Hilty, Nadean Corwin, MD    Current Facility-Administered Medications  Medication Dose Route Frequency  Provider Last Rate Last Dose  . 0.9 %  sodium chloride infusion  250 mL Intravenous PRN Lendon Colonel, NP      . acetaminophen (TYLENOL) tablet 650 mg  650 mg Oral Q4H PRN Lendon Colonel, NP      . clopidogrel (PLAVIX) tablet 75 mg  75 mg Oral Daily Lendon Colonel, NP      . diltiazem (CARDIZEM CD) 24 hr capsule 120 mg  120 mg Oral Daily Lendon Colonel, NP      . furosemide (LASIX) injection 60 mg  60 mg Intravenous Q8H Lendon Colonel, NP      . gabapentin (NEURONTIN) capsule 100 mg  100 mg Oral QHS Lendon Colonel, NP      . isosorbide mononitrate (IMDUR) 24 hr tablet 30 mg  30 mg Oral Daily Lendon Colonel, NP      . metoprolol succinate (TOPROL-XL) 24 hr tablet 100 mg  100 mg Oral Daily Lendon Colonel, NP      . ondansetron [facility]) injection 4 mg  4 mg Intravenous Q6H PRN Lendon Colonel, NP      . ranolazine (RANEXA) 12 hr tablet 500 mg  500 mg Oral BID Lendon Colonel, NP      . senna-docusate (Senokot-S) tablet 1 tablet  1 tablet Oral QHS Lendon Colonel, NP      . sodium chloride flush (NS) 0.9 % injection 3 mL  3 mL Intravenous Q12H Lendon Colonel, NP      . sodium chloride flush (NS) 0.9 % injection 3 mL  3 mL Intravenous PRN Lendon Colonel, NP      . tamsulosin [facility]) capsule 0.4 mg  0.4 mg Oral QPC supper Lendon Colonel, NP      . warfarin (COUMADIN) tablet 5 mg  5 mg Oral q1800 Lendon Colonel, NP        Allergies as of 01/03/2018 - Review Complete 01/03/2018  Allergen Reaction Noted  . Bicalutamide Other (See Comments) 10/13/2012  . Codeine Itching and Rash 01/01/2011  . Niacin and related Other (See Comments) 06/15/2013  . Lovastatin Other (See Comments) 02/23/2011  .  Morphine Other (See Comments) 12/03/2017  . Beef extract Rash 11/17/2015  . Pravastatin Other (See Comments) 08/04/2012    Family History  Problem Relation Age of Onset  . Arthritis Mother   . Other Mother 90  . Heart Problems Father    . Heart disease Brother   . Breast cancer Sister   . Cancer Sister   . Heart Problems Brother     Social History   Socioeconomic History  . Marital status: Widowed    Spouse name: Not on file  . Number of children: Not on file  . Years of education: Not on file  . Highest education level: Not on file  Occupational History  . Not on file  Social Needs  . Financial resource strain: Not on file  . Food insecurity:    Worry: Not on file    Inability: Not on file  . Transportation needs:    Medical: Not on file    Non-medical: Not on file  Tobacco Use  . Smoking status: Former Smoker    Packs/day: 1.00    Years: 25.00    Pack years: 25.00    Types: Cigarettes, Pipe    Last attempt to quit: 02/23/1972    Years since quitting: 45.8  . Smokeless tobacco: Never Used  Substance and Sexual Activity  . Alcohol use: No  . Drug use: No  . Sexual activity: Not on file  Lifestyle  . Physical activity:    Days per week: Not on file    Minutes per session: Not on file  . Stress: Not on file  Relationships  . Social connections:    Talks on phone: Not on file    Gets together: Not on file    Attends religious service: Not on file    Active member of club or organization: Not on file    Attends meetings of clubs or organizations: Not on file    Relationship status: Not on file  . Intimate partner violence:    Fear of current or ex partner: Not on file    Emotionally abused: Not on file    Physically abused: Not on file    Forced sexual activity: Not on file  Other Topics Concern  . Not on file  Social History Narrative   Lives with male friend in a one story home with basement.  Has no children.     Retired from Architect, Hospital doctor, Therapist, music.     Education: some college.     Review of Systems: Gen: Weak, fatigued, malaise no fever sweats chills HEENT: No eye pain or eye discomfort CV: Denies chest pain angina orthopnea does have  some lower extremity swelling Resp: Has had cough with no sputum production GI: Denies vomiting blood, jaundice, and fecal incontinence.   Denies dysphagia or odynophagia. GU : Denies urinary burning, blood in urine, urinary frequency, urinary hesitancy, nocturnal urination, and urinary incontinence.  No renal calculi. MS: No use of nonsteroidal anti-inflammatories does complain of some muscle weakness has a sacral decubitus Derm: Sacral decubitus Psych:  quiet and withdrawn Heme: Denies bruising, bleeding, and enlarged lymph nodes. Neuro: No headache.  No diplopia. No dysarthria.  No dysphasia.  No history of CVA.  No Seizures. No paresthesias.  . Endocrine No DM.  No Thyroid disease.  No Adrenal disease.  Physical Exam: Vital signs in last 24 hours: Temp:  [98.7 F (37.1 C)] 98.7 F (37.1 C) (12/24 1259) Pulse Rate:  [81-94]  81 (12/24 1259) Resp:  [19] 19 (12/24 1259) BP: (92-96)/(50-59) 96/59 (12/24 1300) SpO2:  [96 %] 96 % (12/24 1259) Weight:  [77.6 kg-78.2 kg] 77.6 kg (12/24 1259)   General:   Elderly ill-appearing gentleman waxy-looking appearance Head:  Normocephalic and atraumatic. Eyes:  Sclera clear, no icterus.  Pale conjunctive a Ears:  Normal auditory acuity. Nose:  No deformity, discharge,  or lesions. Mouth:  No deformity or lesions, dentition normal. Neck:  Supple; no masses or thyromegaly.  JVP elevated to jaw Lungs: Lungs with diminished at bases with crackles throughout. Heart: Irregular rate and rhythm with 3/6 systolic murmur left sternal edge Abdomen: Distended hypoactive liver palpable below right costochondral margin Msk: BKA on left Pulses: Carotid bruits audible Extremities: 3+ edema on right foot. Neurologic:  Alert and  oriented x4;. Skin: Sacral decubitus heel decubiti Cervical Nodes:  No significant cervical adenopathy.   Intake/Output from previous day: No intake/output data recorded. Intake/Output this shift: No intake/output data  recorded.  Lab Results: Recent Labs    01/03/18 1303  WBC 12.5*  HGB 7.0*  HCT 21.9*  PLT 282   BMET Recent Labs    01/03/18 1303  NA 137  K 3.9  CL 99  CO2 25  GLUCOSE 129*  BUN 64*  CREATININE 4.07*  CALCIUM 8.9   LFT Recent Labs    01/03/18 1303  PROT 6.7  ALBUMIN 2.9*  AST 19  ALT 14  ALKPHOS 50  BILITOT 0.8   PT/INR Recent Labs    01/03/18 1041  INR 2.8   Hepatitis Panel No results for input(s): HEPBSAG, HCVAB, HEPAIGM, HEPBIGM in the last 72 hours.  Studies/Results: No results found.  Assessment/Plan:  Acute on chronic renal disease.  Appears to have some progression of his renal insufficiency his last creatinine was 2.79 December 2019.  On admission this is increased to 4.07.  He has significant heart failure with hypotension.  I believe this is the etiology of his worsening renal failure.  We will send urinalysis and urine electrolytes.  He was taking an antibiotic second-generation cephalosporin in nursing home.  It is conceivable he could have acute interstitial nephritis that could be worsening his renal failure.  He could also have amyloidosis secondary to his decubiti, he could also have an acute glomerulonephritis that would be in the differential but less likely secondary to infection.  We shall check a renal ultrasound and evaluate for possible hydronephrosis although I think this is less likely.  Blood pressure appears to be very low less than 100 mmHg.  We will stop some of his negative inotropic medications at this time.  If his atrial fibrillation becomes problematic these may need to be added back on modified.  Hypertension/volume.  Patient hypotensive.  I do not think he would tolerate dialysis if it was needed with this lower blood pressure.  Clearance would need to be obtained by CRRT.  He is clearly volume overloaded.  Plan would be to discontinue diltiazem, metoprolol, isosorbide mononitrate, Ranexa and Flomax ordered these medications will  have a negative effect on his blood pressure and limit renal perfusion.  He will need to avoid nonsteroidal anti-inflammatory drugs such as ibuprofen Motrin Aleve and Advil.  Chronic kidney disease stage IV/V.  Continue to monitor patient.  Should blood pressure start to improve then patient may be a candidate for dialysis.  Certainly at this point I am not sure that dialysis would be indicated.  Anemia we will check iron TIBC and percentage  saturations.  Will start darbepoetin if not iron deficient  Bones check PTH and follow calcium phosphorus.  Congestive heart failure EF 40% status post and STEMI in December 2019 with cardiac catheterization and stent to LAD.   Atrial fibrillation rate controlled and anticoagulated with Coumadin.  History of prostate cancer status post radioactive seed implant 2006    LOS: 0 Sherril Croon @TODAY @3 :36 PM

## 2018-01-03 NOTE — Patient Instructions (Addendum)
DIRECT ADMIT TELE- [facility] CHF FLOOR-GO DIRECTLY TO  ADMITTING

## 2018-01-03 NOTE — Consult Note (Addendum)
[facility] Medical Consultation  Greg Booker IEP:[id number] DOB: 09/03/1929 DOA: 01/03/2018 PCP: Raina Mina., MD   Requesting physician: Cardiology Date of consultation: 01/03/2018 Reason for consultation: Anemia, renal failure  HPI:  84 year old male with history of coronary artery disease, history of DVT, chronic A. fib on anticoagulation with Coumadin, hypertension, PAD with left BKA, recent admission to [facility] at the beginning of December 2019 requiring DES to the RCA and currently on Plavix, chronic combined CHF who is admitted to the hospital directly from cardiology office for fluid overload.  Patient is accompanied by his caregiver and they tell me that over the last week, he has gained about 5 pounds and has had progressive lower extremity swelling.  He is also had some blisters that popped open and had "a lot of bleeding", and they saw wound care this morning prior to admission.  He also has noticed that he is making less urine at home.  He was a direct admitted to the cardiology service, underwent blood work and was found to have a creatinine of 4.0 from prior baseline into the mid twos as well as a hemoglobin of 7.0 from prior baselines of about 11.  Currently patient complains of shortness of breath, denies any chest pain, denies any palpitations.  He denies any abdominal pain, nausea or vomiting.  He denies having blood in the stool or black tarry stools, denies any blood in the urine.  Scheduled Meds: . sodium chloride   Intravenous Once  . clopidogrel  75 mg Oral Daily  . diltiazem  120 mg Oral Daily  . furosemide  60 mg Intravenous Q8H  . gabapentin  100 mg Oral QHS  . isosorbide mononitrate  30 mg Oral Daily  . metoprolol succinate  100 mg Oral Daily  . ranolazine  500 mg Oral BID  . senna-docusate  1 tablet Oral QHS  . sodium chloride flush  3 mL Intravenous Q12H  . tamsulosin  0.4 mg Oral QPC supper  . warfarin  5 mg Oral q1800   Continuous  Infusions: . sodium chloride     PRN Meds:.sodium chloride, acetaminophen, ondansetron (ZOFRAN) IV, sodium chloride flush   Review of Systems:  As per HPI, otherwise 10 point review of systems negative   Impression/Recommendations Active Problems:   Acute on chronic systolic heart failure ([place])  1. Acute kidney injury on chronic kidney disease stage IV.  It appears that baseline creatinine is in the mid twos, last time he was discharged from [facility] his creatinine was 3.6.  Currently is elevated 4.0.  I recommended nephrology consult, cardiology will call.  He is going to be diuresed and will monitor renal function with IV Lasix.  Discussed with Dr. Justin Mend with nephrology, he recommends a palliative care consultation as well 2. Anemia, unspecified, likely multifactorial in the setting of chronic kidney disease, will transfuse a unit of packed red blood cells and check fecal occult for blood.  No overt bleeding other than the bleeding wounds reported by the patient 3. Acute on chronic combined CHF-most recent 2D echo was in August 2019 showed an EF of 45-50% with diffuse hypokinesis, as well as grade 2 diastolic dysfunction.  Patient was placed on Lasix 80 mg every 8 hours, continue, strict I's and O's as well as daily weights 4. Chronic A. Fib -continue Coumadin, continue metoprolol and diltiazem for rate control 5. Coronary artery disease -apparently just had a cath earlier this month with a drug-eluting stent placed  in the RCA.  Continue Plavix, management per cardiology. 6. Type 2 diabetes mellitus -placed on sliding scale 7. Decubitus ulcer right lateral heel -consult wound care    Past Medical History:  Diagnosis Date  . Anticoagulated on Coumadin   . Arthritis   . Calcified granuloma of lung ([place]) RIGHT LUNG BASE - STABLE PER CXR 03-03-2010  . Chronic back pain   . CKD (chronic kidney disease), stage III ([place])   . Coronary artery disease    a. 1997 PCI/BMS to the RCA;  b. 2006 Cath: Occluded RCA with collat flow, EF 60%, otw nonobs dzs; b. 2010 MV: No ischemia; c. 06/2013 MV: low risk w/o ischemia.  . Diabetes mellitus without complication ([place])   . Falls   . GERD (gastroesophageal reflux disease)   . Heart murmur   . History of DVT (deep vein thrombosis)    a. Early 2000's.  Marland Kitchen History of pneumonia 01/2013  . Hyperlipidemia   . Hypertensive heart disease   . Impaired hearing BILATERAL AIDS  . Low back pain radiating to left leg   . Mitral regurgitation   . Nocturia   . Numbness and tingling of left leg   . PAF (paroxysmal atrial fibrillation) (H[facility])    a. CHA2DS2VASc = 5-->chronic coumadin.  . Prostate cancer ([place]) RECUR   a. 2006 S/P radioactive seed implant.  . Sciatic nerve pain LEFT LEG PAIN--  EPI INJECTIONS  . Urgency of urination    Past Surgical History:  Procedure Laterality Date  . BACK SURGERY    . CARDIAC CATHETERIZATION  1998   IN-STENT RESTENOSIS-- LARGE COLLATERALS  . CARDIAC CATHETERIZATION  2003  &  2006   2006 ---  MITRAL REGURG/ 100% OCCULSION RCA WITH LARGE COLLATERALS/ MILD DISEASE LAD & CIRCUMFLEX  . CARDIOVASCULAR STRESS TEST  06-03-2008   NORMAL STUDY/ LV NORMAL / COMPARED TO 12-26-2000 INFERIOR WALL ISCHEMIA IS NO LONGER PRESENT.  Marland Kitchen CARDIOVERSION  09-15-2005   UNSUCCESSFUL  . CORONARY ANGIOPLASTY WITH STENT PLACEMENT  1997- DR AL LITTLE   X1 STENT RCA  . CRYOABLATION  03/01/2011   Procedure: CRYO ABLATION PROSTATE;  Surgeon: Ailene Rud, MD;  Location: [facility];  Service: Urology;  Laterality: N/A;  . EXCISION BENIGN BREAST MASS  08-06-2008   LEFT  . JOINT REPLACEMENT    . LAMINECTOMY  10-28-2008   L3 - 4  . LUMBAR LAMINECTOMY/DECOMPRESSION MICRODISCECTOMY Right 04/05/2013   Procedure: Right Lumbar three-four Laminectomy ;  Surgeon: Charlie Pitter, MD;  Location: [place] [facility];  Service: Neurosurgery;  Laterality: Right;  . RADIOACTIVE SEED IMPLANT  08-03-2004   PROSTATE  . TOTAL HIP  ARTHROPLASTY  11/02/2011   Procedure: TOTAL HIP ARTHROPLASTY;  Surgeon: Garald Balding, MD;  Location: [place];  Service: Orthopedics;  Laterality: Left;  Left Total Hip Replacement  . TOTAL HIP ARTHROPLASTY Right 06/26/2013   Procedure: TOTAL HIP ARTHROPLASTY;  Surgeon: Garald Balding, MD;  Location: [place];  Service: Orthopedics;  Laterality: Right;  . TRANSTHORACIC ECHOCARDIOGRAM  07-26-2007   LVSF NORMAL/ EF 55%/ LEFT ATRIUM 4.5CM MILD TO MOD. DILATED/ MILD  MITRAL & TRICUSPID REGURG   Social History:  reports that he quit smoking about 45 years ago. His smoking use included cigarettes and pipe. He has a 25.00 pack-year smoking history. He has never used smokeless tobacco. He reports that he does not drink alcohol or use drugs.  Allergies  Allergen Reactions  . Bicalutamide Other (See Comments)  Casodex caused increase in breast size  . Codeine Itching and Rash  . Niacin And Related Other (See Comments)    Burning and stinging from tablets (otc capsules ok)  . Lovastatin Other (See Comments)    Leg weakness  . Morphine Other (See Comments)    Unknown reaction  . Beef Extract Rash  . Pravastatin Other (See Comments)    Myalgia   Family History  Problem Relation Age of Onset  . Arthritis Mother   . Other Mother 21  . Heart Problems Father   . Heart disease Brother   . Breast cancer Sister   . Cancer Sister   . Heart Problems Brother     Prior to Admission medications   Medication Sig Start Date End Date Taking? Authorizing Provider  aspirin EC 81 MG tablet Take 81 mg by mouth daily.     [provider]  B Complex Vitamins (VITAMIN B COMPLEX PO) Take 1 tablet by mouth daily.     [provider]  bicalutamide (CASODEX) 50 MG tablet Take 50 mg by mouth daily. 04/12/14   [provider]  cefdinir (OMNICEF) 300 MG capsule Take 300 mg by mouth 2 (two) times daily.    [provider]  Cholecalciferol (VITAMIN D3) 5000 units CAPS Take 5,000  Units by mouth daily.    [provider]  clopidogrel (PLAVIX) 75 MG tablet Take 75 mg by mouth daily.    [provider]  diltiazem (DILACOR XR) 120 MG 24 hr capsule Take 1 capsule (120 mg total) by mouth daily. 10/17/17   Hilty, Nadean Corwin, MD  docusate sodium (COLACE) 100 MG capsule Take 1 capsule (100 mg total) by mouth 2 (two) times daily as needed for mild constipation. 05/07/16   Virgel Manifold, MD  Evolocumab with Infusor ([place]) 420 MG/3.5ML SOCT Inject 420 mg into the skin every 30 (thirty) days.    [provider]  furosemide (LASIX) 40 MG tablet TAKE 1 TABLET BY MOUTH TWO  TIMES DAILY 05/11/17   Troy Sine, MD  gabapentin (NEURONTIN) 100 MG capsule Take 200 mg by mouth at bedtime.  03/19/16   [provider]  isosorbide mononitrate (IMDUR) 60 MG 24 hr tablet Take 60 mg by mouth daily.    [provider]  metoprolol tartrate (LOPRESSOR) 25 MG tablet Take 25 mg by mouth 2 (two) times daily.    [provider]  Multiple Vitamins-Minerals (MULTIVITAMIN WITH MINERALS) tablet Take 1 tablet by mouth daily.    [provider]  nitroGLYCERIN (NITROSTAT) 0.4 MG SL tablet Place 1 tablet (0.4 mg total) under the tongue every 5 (five) minutes as needed for chest pain. 10/20/16   Almyra Deforest, PA  ranitidine (ZANTAC) 150 MG tablet Take 150 mg by mouth daily.    [provider]  ranolazine (RANEXA) 500 MG 12 hr tablet Take 1 tablet (500 mg total) by mouth 2 (two) times daily. 10/14/15   Erlene Quan, PA-C  Tamsulosin HCl (FLOMAX) 0.4 MG CAPS Take 0.4 mg by mouth daily after supper.    [provider]  vitamin C (ASCORBIC ACID) 500 MG tablet Take 500 mg by mouth daily.    [provider]  warfarin (COUMADIN) 5 MG tablet Take 1/2 to 1 tablet by mouth daily as directed 10/10/17   Pixie Casino, MD   Physical Exam: Blood pressure (!) 96/59, pulse 81, temperature 98.7 F (37.1 C), temperature source  Oral, resp. rate 19, height  5\' 9"  (1.753 m), weight 77.6 kg, SpO2 96 %. Vitals:   01/03/18 1259 01/03/18 1300  BP: (!) 96/59 (!) 96/59  Pulse: 81   Resp: 19   Temp: 98.7 F (37.1 C)   SpO2: 96%      General: No distress  Eyes: No scleral icterus  Neck: No masses  Cardiovascular: Irregularly irregular, 2+ pitting lower extremity edema, no significant murmurs appreciated  Respiratory: Bibasilar crackles, no wheezing heard.  Good air movement  Abdomen: Soft, nontender, nondistended, bowel sounds positive  Skin: No rash seen  Neurologic: Nonfocal, equal strength  Labs on Admission:  Basic Metabolic Panel: Recent Labs  Lab 01/03/18 1303  NA 137  K 3.9  CL 99  CO2 25  GLUCOSE 129*  BUN 64*  CREATININE 4.07*  CALCIUM 8.9   Liver Function Tests: Recent Labs  Lab 01/03/18 1303  AST 19  ALT 14  ALKPHOS 50  BILITOT 0.8  PROT 6.7  ALBUMIN 2.9*   No results for input(s): LIPASE, AMYLASE in the last 168 hours. No results for input(s): AMMONIA in the last 168 hours. CBC: Recent Labs  Lab 01/03/18 1303  WBC 12.5*  NEUTROABS 10.6*  HGB 7.0*  HCT 21.9*  MCV 102.8*  PLT 282   Cardiac Enzymes: No results for input(s): CKTOTAL, CKMB, CKMBINDEX, TROPONINI in the last 168 hours. BNP: Invalid input(s): POCBNP CBG: No results for input(s): GLUCAP in the last 168 hours.  Radiological Exams on Admission: No results found.  EKG: Independently reviewed.  Telemetry reviewed, A. fib   Neosha Switalski [facility] Pager [phone]  If 7PM-7AM, please contact night-coverage [email or web address] Password [email or web address] 01/03/2018, 3:52 PM

## 2018-01-03 NOTE — Progress Notes (Addendum)
Cardiology Office Note  ERROR. H&P completed after admission.     Phill Myron. West Pugh, ANP, AACC   01/02/2018 1:54 PM    [facility] [place] [address] Office [phone] Fax [phone]

## 2018-01-03 NOTE — H&P (Signed)
Date:  85/27/7824   ID:  [name], DOB [birth year], MRN [id number]  PCP:  Raina Mina., MD  Cardiologist:  Debara Pickett   Date:  76/19/5093   ID:  Greg Booker, DOB [birth year], MRN [id number]  History of Present Illness: Greg Booker is a 84 y.o. male who presents for ongoing assessment and management of CAD with most recent cath in 2006 revealed an occluded RCA with collaterals. Hx of DVT on coumadin  therapy.She is followed in our coumadin clinic and was last seen by them on 12/01/2017.   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 Had cardiac cath at [facility] on 12/12/2017 requiring DES to the RCA. Now on Plavix.  He was last seem by Dr. Debara Pickett on 10/17/2017 for and had recent echocardiogram revealing a LVEF of 60%-65% and moderate Aortic stenosis. He had been admitted for acute renal failure which did not require dialysis. He was scheduled for a fistula placement.   He comes today clearly fluid overloaded with 2+3+ pitting edema in the right leg, having gained over 10 lbs. He was advised to go to ER yesterday but wanted to wait for this appointment. He is dyspneic, falls asleep easily. His skin is spilling open and weeping on the lower right extremity.   He has been a patient of [facility] for rehab and has not been adhering to low sodium diet. He has been having worsening cough and congestion.    Past Medical History:  Diagnosis Date  . Anticoagulated on Coumadin   . Arthritis   . Calcified granuloma of lung ([place]) RIGHT LUNG BASE - STABLE PER CXR 03-03-2010  . Chronic back pain   . CKD (chronic kidney disease), stage III ([place])   . Coronary artery disease    a. 1997 PCI/BMS to the RCA; b. 2006 Cath: Occluded RCA with collat flow, EF 60%, otw nonobs dzs; b. 2010 MV: No ischemia; c. 06/2013 MV: low risk w/o ischemia.  . Diabetes mellitus without complication ([place])   . Falls   . GERD  (gastroesophageal reflux disease)   . Heart murmur   . History of DVT (deep vein thrombosis)    a. Early 2000's.  Marland Kitchen History of pneumonia 01/2013  . Hyperlipidemia   . Hypertensive heart disease   . Impaired hearing BILATERAL AIDS  . Low back pain radiating to left leg   . Mitral regurgitation   . Nocturia   . Numbness and tingling of left leg   . PAF (paroxysmal atrial fibrillation) ([facility])    a. CHA2DS2VASc = 5-->chronic coumadin.  . Prostate cancer ([place]) RECUR   a. 2006 S/P radioactive seed implant.  . Sciatic nerve pain LEFT LEG PAIN--  EPI INJECTIONS  . Urgency of urination     Past Surgical History:  Procedure Laterality Date  . BACK SURGERY    . CARDIAC CATHETERIZATION  1998   IN-STENT RESTENOSIS-- LARGE COLLATERALS  . CARDIAC CATHETERIZATION  2003  &  2006   2006 ---  MITRAL REGURG/ 100% OCCULSION RCA WITH LARGE COLLATERALS/ MILD DISEASE LAD & CIRCUMFLEX  . CARDIOVASCULAR STRESS TEST  06-03-2008   NORMAL STUDY/ LV NORMAL / COMPARED TO 12-26-2000 INFERIOR WALL ISCHEMIA IS NO LONGER PRESENT.  Marland Kitchen CARDIOVERSION  09-15-2005   UNSUCCESSFUL  . CORONARY ANGIOPLASTY WITH STENT PLACEMENT  1997- DR AL LITTLE   X1 STENT RCA  . CRYOABLATION  03/01/2011  Procedure: CRYO ABLATION PROSTATE;  Surgeon: Ailene Rud, MD;  Location: [facility];  Service: Urology;  Laterality: N/A;  . EXCISION BENIGN BREAST MASS  08-06-2008   LEFT  . JOINT REPLACEMENT    . LAMINECTOMY  10-28-2008   L3 - 4  . LUMBAR LAMINECTOMY/DECOMPRESSION MICRODISCECTOMY Right 04/05/2013   Procedure: Right Lumbar three-four Laminectomy ;  Surgeon: Charlie Pitter, MD;  Location: [place] [facility];  Service: Neurosurgery;  Laterality: Right;  . RADIOACTIVE SEED IMPLANT  08-03-2004   PROSTATE  . TOTAL HIP ARTHROPLASTY  11/02/2011   Procedure: TOTAL HIP ARTHROPLASTY;  Surgeon: Garald Balding, MD;  Location: [place];  Service: Orthopedics;  Laterality: Left;  Left Total Hip Replacement  . TOTAL HIP  ARTHROPLASTY Right 06/26/2013   Procedure: TOTAL HIP ARTHROPLASTY;  Surgeon: Garald Balding, MD;  Location: [place];  Service: Orthopedics;  Laterality: Right;  . TRANSTHORACIC ECHOCARDIOGRAM  07-26-2007   LVSF NORMAL/ EF 55%/ LEFT ATRIUM 4.5CM MILD TO MOD. DILATED/ MILD  MITRAL & TRICUSPID REGURG     Current Outpatient Medications  Medication Sig Dispense Refill  . aspirin EC 81 MG tablet Take 81 mg by mouth daily.     . B Complex Vitamins (VITAMIN B COMPLEX PO) Take 1 tablet by mouth daily.     . bicalutamide (CASODEX) 50 MG tablet Take 50 mg by mouth daily.    . Cholecalciferol (VITAMIN D3) 5000 units CAPS Take 5,000 Units by mouth daily.    . clopidogrel (PLAVIX) 75 MG tablet Take 75 mg by mouth daily.    Marland Kitchen diltiazem (DILACOR XR) 120 MG 24 hr capsule Take 1 capsule (120 mg total) by mouth daily. 90 capsule 3  . docusate sodium (COLACE) 100 MG capsule Take 1 capsule (100 mg total) by mouth 2 (two) times daily as needed for mild constipation. 30 capsule 0  . Evolocumab with Infusor ([place]) 420 MG/3.5ML SOCT Inject 420 mg into the skin every 30 (thirty) days.    . furosemide (LASIX) 40 MG tablet TAKE 1 TABLET BY MOUTH TWO  TIMES DAILY 180 tablet 3  . gabapentin (NEURONTIN) 100 MG capsule Take 200 mg by mouth at bedtime.     . isosorbide mononitrate (IMDUR) 60 MG 24 hr tablet Take 60 mg by mouth daily.    . metoprolol succinate (TOPROL-XL) 100 MG 24 hr tablet Take 1 tablet (100 mg total) by mouth daily. Take with or immediately following a meal. 90 tablet 3  . metoprolol succinate (TOPROL-XL) 25 MG 24 hr tablet TAKE 3 TABLETS BY MOUTH  DAILY WITH OR IMMEDIATELY  FOLLOWING A MEAL 270 tablet 3  . Multiple Vitamins-Minerals (MULTIVITAMIN WITH MINERALS) tablet Take 1 tablet by mouth daily.    . nitroGLYCERIN (NITROSTAT) 0.4 MG SL tablet Place 1 tablet (0.4 mg total) under the tongue every 5 (five) minutes as needed for chest pain. 25 tablet PRN  . ranitidine (ZANTAC) 150 MG  tablet Take 150 mg by mouth daily.    . ranolazine (RANEXA) 500 MG 12 hr tablet Take 1 tablet (500 mg total) by mouth 2 (two) times daily. 60 tablet 5  . Tamsulosin HCl (FLOMAX) 0.4 MG CAPS Take 0.4 mg by mouth daily after supper.    . vitamin C (ASCORBIC ACID) 500 MG tablet Take 500 mg by mouth daily.    Marland Kitchen warfarin (COUMADIN) 5 MG tablet Take 1/2 to 1 tablet by mouth daily as directed 90 tablet 0   No current facility-administered  medications for this visit.     Allergies:   Bicalutamide; Codeine; Niacin and related; Lovastatin; Beef extract; and Pravastatin    Social History:  The patient  reports that he quit smoking about 45 years ago. His smoking use included cigarettes and pipe. He has a 25.00 pack-year smoking history. He has never used smokeless tobacco. He reports that he does not drink alcohol or use drugs.   Family History:  The patient's family history includes Arthritis in his mother; Breast cancer in his sister; Cancer in his sister; Heart Problems in his brother and father; Heart disease in his brother; Other (age of onset: 23) in his mother.    ROS: All other systems are reviewed and negative. Unless otherwise mentioned in H&P    PHYSICAL EXAM: VS: BP 92/50, HR 94 irregular, weight 180 lbs. GEN: Well nourished, well developed, in no acute distress HEENT: normal Neck:  Positive JVD, carotid bruits, or masses Cardiac: IRRR; 2/6 systolic murmurs, rubs, or gallops, 2+-3+ pitting edema  Respiratory:  Bilateral rales and rhonchi  GI: soft, nontender, distended, + BS MS: no deformity or atrophy. Left BKA.  Skin: Weeping right lower extremity with erythema.  Neuro:  Strength and sensation are diminished Psych: euthymic mood, full affect, somnolence    EKG: Atrial fib with rate of 94 bpm. Antero/septal, with lateral T-wave abnormality.   Recent Labs: No results found for requested labs within last 8760 hours.    Lipid Panel    Component Value Date/Time   CHOL 69 (L)  12/01/2016 1025   TRIG 64 12/01/2016 1025   HDL 32 (L) 12/01/2016 1025   CHOLHDL 2.2 12/01/2016 1025   LDLCALC 24 12/01/2016 1025      Wt Readings from Last 3 Encounters:  10/17/17 170 lb (77.1 kg)  09/27/17 175 lb (79.4 kg)  09/02/17 175 lb (79.4 kg)      Other studies Reviewed:  Cardiac cath [facility] 12/12/2017)             Room Number [place]  Date of Study 12/12/2017  Referring  Remo Lipps    Diagnostic Remo Lipps   Interventional Remo Lipps  Physician  Rohrbeck,  Physician  Rohrbeck,  Physician   Marijo File,        MD, [facility]         MD, [facility]          MD, [facility]  Diagnostic Cath Status: Elective  Interventional Cath Status: Elective  Procedure  Procedure Type  Diagnostic procedure: CARDIAC CATH  PCI procedure:  Conclusions  Diagnostic Summary  Chronic Total Occlusion of the RCA with good collateral filling.  Severe stenosis of the LAD  Otherwise non-obstructive coronary artery disease.  Normal LV function LV ejection fraction is 50-55 %  Moderate to severe Aortic Stenosis  Elevated pulmonary artery, pulmonary capillary wedge pressure.  Interventional Summary  Successful PCI / 2.75X15 mm Xience Drug Eluting Stent of the proximal LAD  Coronary Artery.  Interventional Recommendations  Dual anti-platelet therapy with Clopidogrel and Aspirin 81 mg for at least 1  months is recommended. Then may drop aspirin with his chronic anti-coagulation  Signatures  Electronically signed by Clarene Critchley, MD,  FACC(Interventional Physician) on 12/12/2017 12:36  Angiographic Findings  Cardiac Arteries and Lesion Findings  LMCA:  Lesion on LMCA: 15% stenosis 6 mm length .  LAD:  Lesion on Mid LAD:  Devices used  - 0.014" X 190cm All-Star J-Tip PTCI guidewire. Number of passes: 1.  - 2.5 mm X 15 mm Trek  RX. 2 inflation(s) to a max pressure of: 12 atm.  - 2.75 mm x 15 mm Xience Sierra DES Stent.  2 inflation(s) to a max  pressure of: 14 atm.  Lesion on Prox LAD: Mid subsection.75% stenosis 12 mm length reduced to 0%.  Pre procedure TIMI III flow was noted. Post Procedure TIMI III flow was  present. Good run off was present. The lesion was diagnosed as High Risk  (C). Bifurcation lesion.  Devices used  - 2.75 x 15 XIENCE V RX DES (ACC_162). (Primary Device)Length: 15 mm.  LCx:  Lesion on Prox CX: 15% stenosis 12 mm length .  RCA:  Lesion on Prox RCA: 100% stenosis 8 mm length . Pre procedure TIMI 0 flow  was noted.  Ramus:  Lesion on Ramus: 10% stenosis 3 mm length .  Cardiac Collaterals  - collateral flow from the Dist LAD to the R PDA.  - collateral flow from the Dist CX to the R PAV.  Procedure Data  Procedure Date  Date: 12/12/2017 Start: 11:27  Fluoroscopy Time: PCI: 0:00 minutes. Total: 0:00 minutes.  Medical History  Allergies  - BEEF CONTAINING PRODUCTS allergy. Reaction: Other.  - PRAVASTATIN allergy.  - NIACIN PREPARATIONS allergy. Reaction: Other.  - MORPHINE allergy. Reaction: Other.  Admission Data  Admission Date: 12/12/2017  Admission Status: Outpatient  Coronary Tree  Dominance: Right  [facility]  LV function assessed BP:ZWCHEN.  Ejection Fraction  - Method: Estimated. EF%: 55.   Echocardiogram 09/07/2017 Left ventricle: The cavity size was normal. Systolic function was   mildly reduced. The estimated ejection fraction was in the range   of 45% to 50%. Diffuse hypokinesis. Features are consistent with   a pseudonormal left ventricular filling pattern, with concomitant   abnormal relaxation and increased filling pressure (grade 2   diastolic dysfunction). - Aortic valve: Trileaflet; severely thickened, severely calcified   leaflets. Valve mobility was restricted. There was moderate   stenosis. There was trivial regurgitation. Peak velocity (S): 354   cm/s. Prior peak velocity 3.0 m/s Mean gradient (S):  19 mm Hg. - Aorta: The aorta was moderately calcified. - Mitral valve: Calcified annulus. There was mild regurgitation. - Left atrium: The atrium was moderately dilated. Volume/bsa, S:   46.8 ml/m^2. - Tricuspid valve: There was mild regurgitation. - Pulmonary arteries: Systolic pressure was mildly increased. PA   peak pressure: 34 mm Hg (S).  NM Stress Test 10/09/2015  Study Highlights     Nuclear stress EF: 53%.  Downsloping ST segment depression ST segment depression of 1 mm was noted during stress in the aVF, V5, III and II leads.  Defect 1: There is a defect present in the basal inferior, basal inferolateral, mid inferior, mid inferolateral and apical inferior location.  Findings consistent with prior myocardial infarction with peri-infarct ischemia.  This is a low risk study.   Low risk stress nuclear study with inferior wall scar and inferolateral peri-infarct ischemia. Borderline decreased left ventricular global systolic function.    ASSESSMENT AND PLAN:  1. Acute on Chronic Decompensated Diastolic CHF: Patient has significant fluid overload due multiple factors, to include dietary non-compliance. He is a resident of [facility]. He has significant edema in his right lower extremity aswith skin spitting.   He will be admitted for IV diureses and management of CKD. I will start him at IV lasix 60 mg Q 8 hours, decrease isosorbide to 30 mg daily from 60 mg daily to avoid  hypotension, and continue him on other  medications. I have written order for catheter due to his Left BKA and aggressive diureses.   I have discussed this with Dr. Gwenlyn Found who is DOD in the office today who agrees with my assessment and plan. He will be sent [facility] for admission and diuresis.   2.CAD: DES to the RCA 12/12/2017, with severe stenosis of the LAD. Completed at [facility].   3. Atrial fib: He is on coumadin. Was checked in the office by pharmacy today. INR 2.5. He will have pharmacy consult to  manage during admission. Orders have been written for this.   4. CKD: Stage III He has been planned for a dialysis shunt but this has not been completed yet. Follow labs. Creatinine 2.46 on 12/01/2017. Did not place on ACE/ARB at this time.   5. PAD: Hx of acute ischemia of the left leg, with amputation of 3 toes for dry gangrene, had left femoral tibial bypass. He is in rehab facility for strengthening of right leg.   6. Possible Cellulitis of the right leg due to skin tears. Need further evaluation and possible antibiotic therapy if clinically warranted.   7. Hx of diabetes: Not on diabetic meds at this time.     Phill Myron. West Pugh, ANP, AACC   01/02/2018 1:54 PM    [facility] [address] Office [phone] Fax [phone]

## 2018-01-04 ENCOUNTER — Inpatient Hospital Stay (HOSPITAL_COMMUNITY): Payer: Medicare Other

## 2018-01-04 DIAGNOSIS — N183 Chronic kidney disease, stage 3 (moderate): Secondary | ICD-10-CM

## 2018-01-04 DIAGNOSIS — D649 Anemia, unspecified: Secondary | ICD-10-CM | POA: Diagnosis present

## 2018-01-04 DIAGNOSIS — E1151 Type 2 diabetes mellitus with diabetic peripheral angiopathy without gangrene: Secondary | ICD-10-CM

## 2018-01-04 DIAGNOSIS — D631 Anemia in chronic kidney disease: Secondary | ICD-10-CM

## 2018-01-04 DIAGNOSIS — I5023 Acute on chronic systolic (congestive) heart failure: Secondary | ICD-10-CM

## 2018-01-04 DIAGNOSIS — I739 Peripheral vascular disease, unspecified: Secondary | ICD-10-CM

## 2018-01-04 DIAGNOSIS — L8990 Pressure ulcer of unspecified site, unspecified stage: Secondary | ICD-10-CM

## 2018-01-04 DIAGNOSIS — I350 Nonrheumatic aortic (valve) stenosis: Secondary | ICD-10-CM

## 2018-01-04 LAB — URINALYSIS, ROUTINE W REFLEX MICROSCOPIC
Bacteria, UA: NONE SEEN
Bilirubin Urine: NEGATIVE
Glucose, UA: NEGATIVE mg/dL
Ketones, ur: NEGATIVE mg/dL
Nitrite: NEGATIVE
Protein, ur: NEGATIVE mg/dL
RBC / HPF: >50 RBC/hpf — ABNORMAL HIGH (ref 0–5)
Specific Gravity, Urine: 1.011 (ref 1.005–1.030)
pH: 6 (ref 5.0–8.0)

## 2018-01-04 LAB — BASIC METABOLIC PANEL
Anion gap: 14 (ref 5–15)
BUN: 64 mg/dL — ABNORMAL HIGH (ref 8–23)
CO2: 26 mmol/L (ref 22–32)
Calcium: 8.5 mg/dL — ABNORMAL LOW (ref 8.9–10.3)
Chloride: 100 mmol/L (ref 98–111)
Creatinine, Ser: 3.95 mg/dL — ABNORMAL HIGH (ref 0.61–1.24)
GFR calc Af Amer: 15 mL/min — ABNORMAL LOW (ref >60)
GFR calc non Af Amer: 13 mL/min — ABNORMAL LOW (ref >60)
Glucose, Bld: 119 mg/dL — ABNORMAL HIGH (ref 70–99)
Potassium: 3.6 mmol/L (ref 3.5–5.1)
Sodium: 140 mmol/L (ref 135–145)

## 2018-01-04 LAB — HEMOGLOBIN AND HEMATOCRIT, BLOOD
HCT: 20.8 % — ABNORMAL LOW (ref 39.0–52.0)
Hemoglobin: 7 g/dL — ABNORMAL LOW (ref 13.0–17.0)

## 2018-01-04 LAB — GLUCOSE, CAPILLARY
Glucose-Capillary: 110 mg/dL — ABNORMAL HIGH (ref 70–99)
Glucose-Capillary: 124 mg/dL — ABNORMAL HIGH (ref 70–99)
Glucose-Capillary: 129 mg/dL — ABNORMAL HIGH (ref 70–99)
Glucose-Capillary: 114 mg/dL — ABNORMAL HIGH (ref 70–99)

## 2018-01-04 LAB — PROTIME-INR
INR: 1.97
Prothrombin Time: 22.2 seconds — ABNORMAL HIGH (ref 11.4–15.2)

## 2018-01-04 LAB — PREPARE RBC (CROSSMATCH)

## 2018-01-04 LAB — SODIUM, URINE, RANDOM: Sodium, Ur: 70 mmol/L

## 2018-01-04 MED ORDER — DARBEPOETIN ALFA 200 MCG/0.4ML IJ SOSY
200.0000 ug | PREFILLED_SYRINGE | INTRAMUSCULAR | Status: DC
  Administered 2018-01-04: 200 ug via SUBCUTANEOUS
  Filled 2018-01-04: qty 0.4

## 2018-01-04 MED ORDER — DM-GUAIFENESIN ER 30-600 MG PO TB12
1.0000 | ORAL_TABLET | Freq: Two times a day (BID) | ORAL | Status: DC
  Administered 2018-01-04 – 2018-01-10 (×13): 1 via ORAL
  Filled 2018-01-04 (×13): qty 1

## 2018-01-04 MED ORDER — SODIUM CHLORIDE 0.9% IV SOLUTION: Freq: Once | INTRAVENOUS | Status: DC

## 2018-01-04 MED ORDER — METOPROLOL TARTRATE 12.5 MG HALF TABLET
12.5000 mg | ORAL_TABLET | Freq: Two times a day (BID) | ORAL | Status: DC
  Administered 2018-01-04 – 2018-01-05 (×2): 12.5 mg via ORAL
  Filled 2018-01-04 (×3): qty 1

## 2018-01-04 MED ORDER — WARFARIN SODIUM 2.5 MG PO TABS
2.5000 mg | ORAL_TABLET | Freq: Once | ORAL | Status: AC
  Administered 2018-01-04: 2.5 mg via ORAL
  Filled 2018-01-04: qty 1

## 2018-01-04 MED ORDER — DILTIAZEM HCL 60 MG PO TABS
30.0000 mg | ORAL_TABLET | Freq: Four times a day (QID) | ORAL | Status: DC
  Administered 2018-01-04 – 2018-01-05 (×5): 30 mg via ORAL
  Filled 2018-01-04 (×5): qty 1

## 2018-01-04 NOTE — Progress Notes (Signed)
Notified by RN pt's HR staying in the 130s, chronic a fib but had been rate controlled.   He has been transfused and Hgb remains 7.0.  May need another unit, IM following.  Pt has been on dilt 240 mg po as outpt - held due to labile hypotension.  BP is improving, so will add diltiazem 30 mg po every 6 hours back for rate control.

## 2018-01-04 NOTE — Progress Notes (Signed)
DAILY PROGRESS NOTE   Patient Name: Greg Booker Date of Encounter: 01/04/2018  Chief Complaint   Tired, short of breath  Patient Profile   84 yo male with AKI on CKD, acute decompensated diastolic CHF, PAF, PAD s/p amputation and anemia, admitted for CHF.  Subjective   Feels better after transfusion. Markedly volume overloaded. Weak. In afib with RVR. Repeat CBC pending.   Objective   Vitals:   01/03/18 1300 01/03/18 2046 01/04/18 0019 01/04/18 0514  BP: (!) 96/59 131/78 (!) 120/48 139/68  Pulse:  93 (!) 111 99  Resp: 18     Temp:  98.4 F (36.9 C) 98.6 F (37 C) 98.3 F (36.8 C)  TempSrc:  Oral Oral Oral  SpO2:  93% 94% 93%  Weight:    74.6 kg  Height:        Intake/Output Summary (Last 24 hours) at 01/04/2018 0932 Last data filed at 01/04/2018 0351 Gross per 24 hour  Intake 306.67 ml  Output 1500 ml  Net -1193.33 ml   Filed Weights   01/03/18 1259 01/04/18 0514  Weight: 77.6 kg 74.6 kg    Physical Exam   General appearance: alert, mild distress and pale Neck: JVD - several cm above sternal notch, no carotid bruit and thyroid not enlarged, symmetric, no tenderness/mass/nodules Lungs: diminished breath sounds bilaterally Heart: regular rate and rhythm, S1, S2 normal and systolic murmur: late systolic 3/6, musical at 2nd right intercostal space Abdomen: soft, non-tender; bowel sounds normal; no masses,  no organomegaly Extremities: edema 2+ weeping edema of the RLE, LLE BKA Pulses: 1+ pulses Skin: pale, warm, dry Neurologic: Mental status: Alert, oriented, thought content appropriate Psych: Optimistic  Inpatient Medications    Scheduled Meds: . sodium chloride   Intravenous Once  . clopidogrel  75 mg Oral Daily  . furosemide  60 mg Intravenous Q8H  . gabapentin  100 mg Oral QHS  . insulin aspart  0-9 Units Subcutaneous TID WC  . senna-docusate  1 tablet Oral QHS  . sodium chloride flush  3 mL Intravenous Q12H  . Warfarin - Pharmacist Dosing  Inpatient   Does not apply q1800    Continuous Infusions: . sodium chloride      PRN Meds: sodium chloride, acetaminophen, ondansetron (ZOFRAN) IV, sodium chloride flush   Labs   Results for orders placed or performed during the hospital encounter of 01/03/18 (from the past 48 hour(s))  CBC WITH DIFFERENTIAL     Status: Abnormal   Collection Time: 01/03/18  1:03 PM  Result Value Ref Range   WBC 12.5 (H) 4.0 - 10.5 K/uL   RBC 2.13 (L) 4.22 - 5.81 MIL/uL   Hemoglobin 7.0 (L) 13.0 - 17.0 g/dL   HCT 21.9 (L) 39.0 - 52.0 %   MCV 102.8 (H) 80.0 - 100.0 fL   MCH 32.9 26.0 - 34.0 pg   MCHC 32.0 30.0 - 36.0 g/dL   RDW 18.2 (H) 11.5 - 15.5 %   Platelets 282 150 - 400 K/uL   nRBC 0.2 0.0 - 0.2 %   Neutrophils Relative % 85 %   Neutro Abs 10.6 (H) 1.7 - 7.7 K/uL   Lymphocytes Relative 8 %   Lymphs Abs 1.0 0.7 - 4.0 K/uL   Monocytes Relative 6 %   Monocytes Absolute 0.8 0.1 - 1.0 K/uL   Eosinophils Relative 0 %   Eosinophils Absolute 0.1 0.0 - 0.5 K/uL   Basophils Relative 0 %   Basophils Absolute 0.0 0.0 -  0.1 K/uL   Immature Granulocytes 1 %   Abs Immature Granulocytes 0.09 (H) 0.00 - 0.07 K/uL    Comment: Performed at [facility], [place] [address], [place], [place] [address]  Comprehensive metabolic panel     Status: Abnormal   Collection Time: 01/03/18  1:03 PM  Result Value Ref Range   Sodium 137 135 - 145 mmol/L   Potassium 3.9 3.5 - 5.1 mmol/L   Chloride 99 98 - 111 mmol/L   CO2 25 22 - 32 mmol/L   Glucose, Bld 129 (H) 70 - 99 mg/dL   BUN 64 (H) 8 - 23 mg/dL   Creatinine, Ser 4.07 (H) 0.61 - 1.24 mg/dL   Calcium 8.9 8.9 - 10.3 mg/dL   Total Protein 6.7 6.5 - 8.1 g/dL   Albumin 2.9 (L) 3.5 - 5.0 g/dL   AST 19 15 - 41 U/L   ALT 14 0 - 44 U/L   Alkaline Phosphatase 50 38 - 126 U/L   Total Bilirubin 0.8 0.3 - 1.2 mg/dL   GFR calc non Af Amer 12 (L) >60 mL/min   GFR calc Af Amer 14 (L) >60 mL/min   Anion gap 13 5 - 15    Comment: Performed at [facility], [address] [place], [place] [address]  Brain natriuretic peptide     Status: Abnormal   Collection Time: 01/03/18  1:03 PM  Result Value Ref Range   B Natriuretic Peptide 1,394.2 (H) 0.0 - 100.0 pg/mL    Comment: Performed at [facility] [address] [place], [place] [address]  Urinalysis, Routine w reflex microscopic     Status: Abnormal   Collection Time: 01/03/18  4:03 PM  Result Value Ref Range   Color, Urine YELLOW YELLOW   APPearance CLEAR CLEAR   Specific Gravity, Urine 1.011 1.005 - 1.030   pH 6.0 5.0 - 8.0   Glucose, UA NEGATIVE NEGATIVE mg/dL   Hgb urine dipstick MODERATE (A) NEGATIVE   Bilirubin Urine NEGATIVE NEGATIVE   Ketones, ur NEGATIVE NEGATIVE mg/dL   Protein, ur NEGATIVE NEGATIVE mg/dL   Nitrite NEGATIVE NEGATIVE   Leukocytes, UA TRACE (A) NEGATIVE   RBC / HPF >50 (H) 0 - 5 RBC/hpf   WBC, UA 11-20 0 - 5 WBC/hpf   Bacteria, UA NONE SEEN NONE SEEN   Squamous Epithelial / LPF 0-5 0 - 5   Mucus PRESENT    Hyaline Casts, UA PRESENT     Comment: Performed at [facility] [address], [place], [place] [address]  Sodium, urine, random     Status: None   Collection Time: 01/03/18  4:03 PM  Result Value Ref Range   Sodium, Ur 70 mmol/L    Comment: Performed at [facility] [address], [place], [place] [address]  Prepare RBC     Status: None   Collection Time: 01/03/18  4:31 PM  Result Value Ref Range   Order Confirmation      ORDER PROCESSED BY BLOOD BANK Performed at [facility], [place] [address] [place], [place] [address]   Type and screen [place]     Status: None (Preliminary result)   Collection Time: 01/03/18  4:40 PM  Result Value Ref Range   ABO/RH(D) A NEG    Antibody Screen NEG    Sample Expiration 01/06/2018    Unit Number [id number]    Blood Component Type RED CELLS,LR  Unit division 00    Status of Unit ISSUED    Transfusion Status OK TO TRANSFUSE    Crossmatch Result       Compatible Performed at [facility], [place] [address] [place], [place] [address]   Iron and TIBC     Status: Abnormal   Collection Time: 01/03/18  4:57 PM  Result Value Ref Range   Iron 80 45 - 182 ug/dL   TIBC 231 (L) 250 - 450 ug/dL   Saturation Ratios 35 17.9 - 39.5 %   UIBC 151 ug/dL    Comment: Performed at [facility] [address], [place], [place] [address]  Glucose, capillary     Status: Abnormal   Collection Time: 01/03/18  5:05 PM  Result Value Ref Range   Glucose-Capillary 124 (H) 70 - 99 mg/dL  Glucose, capillary     Status: Abnormal   Collection Time: 01/03/18  9:59 PM  Result Value Ref Range   Glucose-Capillary 116 (H) 70 - 99 mg/dL  Basic metabolic panel     Status: Abnormal   Collection Time: 01/04/18  4:02 AM  Result Value Ref Range   Sodium 140 135 - 145 mmol/L   Potassium 3.6 3.5 - 5.1 mmol/L   Chloride 100 98 - 111 mmol/L   CO2 26 22 - 32 mmol/L   Glucose, Bld 119 (H) 70 - 99 mg/dL   BUN 64 (H) 8 - 23 mg/dL   Creatinine, Ser 3.95 (H) 0.61 - 1.24 mg/dL   Calcium 8.5 (L) 8.9 - 10.3 mg/dL   GFR calc non Af Amer 13 (L) >60 mL/min   GFR calc Af Amer 15 (L) >60 mL/min   Anion gap 14 5 - 15    Comment: Performed at [facility] [address] [place], [place] [address]  Protime-INR     Status: Abnormal   Collection Time: 01/04/18  4:02 AM  Result Value Ref Range   Prothrombin Time 22.2 (H) 11.4 - 15.2 seconds   INR 1.97     Comment: Performed at [facility] [address], [place], [place] [address]    ECG   N/A  Telemetry   Afib with RVR - Personally Reviewed  Radiology    US Renal  Result Date: 01/03/2018 CLINICAL DATA:  Chronic renal insufficiency. Congestive heart failure and shortness of breath. EXAM: RENAL / URINARY TRACT ULTRASOUND COMPLETE COMPARISON:  CT of the chest, abdomen and pelvis performed 11/29/2017 FINDINGS: Right Kidney: Renal measurements: 9.5 x 5.9 x 5.3 cm = volume: 160.4 mL. Increased  parenchymal echogenicity is noted. No mass or hydronephrosis visualized. Left Kidney: Renal measurements: 11.2 x 5.6 x 5.6 cm = volume: 182.6 mL. Increased parenchymal echogenicity is noted. No mass or hydronephrosis visualized. Bladder: Decompressed, with a Foley catheter in place. Small bilateral pleural effusions are noted. IMPRESSION: 1. No evidence of hydronephrosis. 2. Increased renal parenchymal echogenicity likely reflects medical renal disease. 3. Small bilateral pleural effusions noted. Electronically Signed   By: Garald Balding M.D.   On: 01/03/2018 21:38    Cardiac Studies   N/A  Assessment   Principal Problem:   Acute on chronic systolic heart failure (HCC) Active Problems:   CKD (chronic kidney disease)   PVD (peripheral vascular disease) with claudication (HCC)   CAD (coronary artery disease)   Aortic valve stenosis   Diabetes mellitus ([place])   Diabetic peripheral vascular disease (HCC)   Anemia   Plan   1.  Mr. Noone has multiple abnormalities and is quite ill. He was anemic and feels better after transfusion - post-transfusion CBC is pending. In afib with RVR - hypotensive. Diuresing as tolerated. Has had 1.1L negative-  There is blood in the foley catheter -? If this is related to trauma - has history of prostate CA - may need urology evaluation. Appreciate nephrology and IM assistance. His aortic murmur is high-pitched and late-peaking, may be more severe stenosis at this point - possibly worse in the setting of high output from anemia. Will repeat limited echo for AS. INR slightly subtherapeutic -appreciate management per pharmacy.   Time Spent Directly with Patient:  I have spent a total of 35 minutes with the patient reviewing hospital notes, telemetry, EKGs, labs and examining the patient as well as establishing an assessment and plan that was discussed personally with the patient.  > 50% of time was spent in direct patient care.  Length of Stay:  LOS: 1 day    Pixie Casino, MD, [facility], [place] Director of the Advanced Lipid Disorders &  Cardiovascular Risk Reduction Clinic Diplomate of the American Board of Clinical Lipidology Attending Cardiologist  Direct Dial: [phone]  Fax: [phone]  Website:  [email or web address] 01/04/2018, 7:16 AM

## 2018-01-04 NOTE — Progress Notes (Addendum)
PROGRESS NOTE    Auburn Hert Bozzo  AVW:[id number] DOB: 02-09-1929 DOA: 01/03/2018 PCP: Raina Mina., MD   Brief Narrative:  HPI on 01/03/2018 by Ms. Jory Sims, NP (cardiology) Greg Booker is a 84 y.o. male who presents for ongoing assessment and management of CAD with most recent cath in 2006 revealed an occluded RCA with collaterals. Hx of DVT on coumadin  therapy.She is followed in our coumadin clinic and was last seen by them on 12/01/2017.   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 Had cardiac cath at [facility] on 12/12/2017 requiring DES to the RCA. Now on Plavix.  He was last seem by Dr. Debara Pickett on 10/17/2017 for and had recent echocardiogram revealing a LVEF of 60%-65% and moderate Aortic stenosis. He had been admitted for acute renal failure which did not require dialysis. He was scheduled for a fistula placement.   He comes today clearly fluid overloaded with 2+3+ pitting edema in the right leg, having gained over 10 lbs. He was advised to go to ER yesterday but wanted to wait for this appointment. He is dyspneic, falls asleep easily. His skin is spilling open and weeping on the lower right extremity.   He has been a patient of [facility] for rehab and has not been adhering to low sodium diet. He has been having worsening cough and congestion.    Interim history [facility] assumed care of this patient as he had multiple medical problems.  Patient was admitted by cardiology for volume overload/CHF.  Assessment & Plan   Acute kidney injury on chronic kidney disease, stage IV -Patient's baseline creatinine around 2.5, however on admission was 4.07 -Nephrology consulted and appreciated, may have urinary tract infection, interstitial nephritis.  Does not feel patient would be a hemodialysis candidate given his hypotension. -will continue to monitor BMP closely as patient will be getting  IV diuresis -Creatinine currently 3.95 -Renal ultrasound showed no evidence of hydronephrosis.    Acute on chronic combined CHF -Echocardiogram August 2019 showed an EF of 45 to 50%, diffuse hypokinesis, grade 2 diastolic dysfunction -BNP 1394 -Patient currently on IV Lasix 60 mg 3 times daily -Monitor intake and output, daily weights -Cardiology following  Anemia -Likely multifactorial including chronic kidney disease, volume overload with CHF -hemoglobin on admission was 7, patient was given 1 unit PRBC -Upon review of patient's chart, hemoglobin was approximately 9-10 in 2018 (per care everywhere) -No repeat hemoglobin done today -Anemia panel showed adequate iron -Nephrology starting darbepoetin  Chronic atrial fibrillation -Continue Coumadin, diltiazem, metoprolol  Coronary artery disease -Patient had catheterization earlier this month with drug-eluting stent placed in the RCA. -Currently no complaints of chest pain -Continue Plavix and management per cardiology  Diabetes mellitus, type II -Continue insulin sliding scale and CBG monitoring  Right lateral heel decubitus ulcer -Wound care consulted  History of prostate cancer -Status post radioactive seed placement -? Source of anemia in conjunction with coumadin   DVT Prophylaxis  Coumadin  Code Status: Full  Family Communication: None at bedside  Disposition Plan: Admitted. Pending cardiology and nephrology recommendations for CHF and CKD.   Consultants Nephrology Cardiology  Procedures  None  Antibiotics   Anti-infectives (From admission, onward)   None      Subjective:   Greg Booker seen and examined today.  To feel weak and short of breath.  Currently denies chest pain, abdominal pain,  nausea or vomiting, diarrhea or constipation, dizziness or headache.  Objective:   Vitals:   01/03/18 2046 01/04/18 0019 01/04/18 0514 01/04/18 1408  BP: 131/78 (!) 120/48 139/68 (!) 103/91  Pulse: 93 (!) 111  99 (!) 114  Resp:    20  Temp: 98.4 F (36.9 C) 98.6 F (37 C) 98.3 F (36.8 C) 98.3 F (36.8 C)  TempSrc: Oral Oral Oral Oral  SpO2: 93% 94% 93% 98%  Weight:   74.6 kg   Height:        Intake/Output Summary (Last 24 hours) at 01/04/2018 1444 Last data filed at 01/04/2018 1411 Gross per 24 hour  Intake 546.67 ml  Output 1500 ml  Net -953.33 ml   Filed Weights   01/03/18 1259 01/04/18 0514  Weight: 77.6 kg 74.6 kg    Exam  General: Well developed, chronically ill-appearing, NAD  HEENT: NCAT, mucous membranes moist.   Neck: Supple, + JVD  Cardiovascular: S1 S2 auscultated, cardia, 3/6 SEM  Respiratory: Diminished breath sounds bilaterally  Abdomen: Soft, nontender, nondistended, + bowel sounds  Extremities: Left AKA, +2RLE edema  Neuro: AAOx3, nonfocal  Skin: Without rashes exudates or nodules  Psych: Normal affect and demeanor with intact judgement and insight   Data Reviewed: I have personally reviewed following labs and imaging studies  CBC: Recent Labs  Lab 01/03/18 1303  WBC 12.5*  NEUTROABS 10.6*  HGB 7.0*  HCT 21.9*  MCV 102.8*  PLT 875   Basic Metabolic Panel: Recent Labs  Lab 01/03/18 1303 01/04/18 0402  NA 137 140  K 3.9 3.6  CL 99 100  CO2 25 26  GLUCOSE 129* 119*  BUN 64* 64*  CREATININE 4.07* 3.95*  CALCIUM 8.9 8.5*   GFR: Estimated Creatinine Clearance: 13.4 mL/min (A) (by C-G formula based on SCr of 3.95 mg/dL (H)). Liver Function Tests: Recent Labs  Lab 01/03/18 1303  AST 19  ALT 14  ALKPHOS 50  BILITOT 0.8  PROT 6.7  ALBUMIN 2.9*   No results for input(s): LIPASE, AMYLASE in the last 168 hours. No results for input(s): AMMONIA in the last 168 hours. Coagulation Profile: Recent Labs  Lab 01/03/18 1041 01/04/18 0402  INR 2.8 1.97   Cardiac Enzymes: No results for input(s): CKTOTAL, CKMB, CKMBINDEX, TROPONINI in the last 168 hours. BNP (last 3 results) No results for input(s): PROBNP in the last 8760  hours. HbA1C: No results for input(s): HGBA1C in the last 72 hours. CBG: Recent Labs  Lab 01/03/18 1705 01/03/18 2159 01/04/18 0734 01/04/18 1127  GLUCAP 124* 116* 110* 124*   Lipid Profile: No results for input(s): CHOL, HDL, LDLCALC, TRIG, CHOLHDL, LDLDIRECT in the last 72 hours. Thyroid Function Tests: No results for input(s): TSH, T4TOTAL, FREET4, T3FREE, THYROIDAB in the last 72 hours. Anemia Panel: Recent Labs    01/03/18 1657  TIBC 231*  IRON 80   Urine analysis:    Component Value Date/Time   COLORURINE YELLOW 01/03/2018 1603   APPEARANCEUR CLEAR 01/03/2018 1603   LABSPEC 1.011 01/03/2018 1603   PHURINE 6.0 01/03/2018 1603   GLUCOSEU NEGATIVE 01/03/2018 1603   HGBUR MODERATE (A) 01/03/2018 1603   BILIRUBINUR NEGATIVE 01/03/2018 1603   KETONESUR NEGATIVE 01/03/2018 1603   PROTEINUR NEGATIVE 01/03/2018 1603   UROBILINOGEN 0.2 10/29/2011 0945   NITRITE NEGATIVE 01/03/2018 1603   LEUKOCYTESUR TRACE (A) 01/03/2018 1603   Sepsis Labs: @LABRCNTIP (procalcitonin:4,lacticidven:4)  )No results found for this or any previous visit (from the past 240 hour(s)).    Radiology  Studies: Dg Chest 2 View  Result Date: 01/04/2018 CLINICAL DATA:  Chest congestion cough and sob x5 weeks EXAM: CHEST - 2 VIEW COMPARISON:  12/24/2017 FINDINGS: Mild hyperinflation on the lateral view. A mid to lower thoracic vertebral compression deformity is moderate. Midline trachea. Cardiomegaly accentuated by AP portable technique. Atherosclerosis in the transverse aorta. Small left pleural effusion, decreased. Resolved interstitial edema. Improved left greater than right base airspace disease. IMPRESSION: 1. Cardiomegaly with resolution of interstitial edema. 2. Small left pleural effusion, decreased. 3. Improved bibasilar airspace disease, likely atelectasis. At the left lung base, residual infection/aspiration cannot be excluded. Electronically Signed   By: Abigail Miyamoto M.D.   On: 01/04/2018  07:19   US Renal  Result Date: 01/03/2018 CLINICAL DATA:  Chronic renal insufficiency. Congestive heart failure and shortness of breath. EXAM: RENAL / URINARY TRACT ULTRASOUND COMPLETE COMPARISON:  CT of the chest, abdomen and pelvis performed 11/29/2017 FINDINGS: Right Kidney: Renal measurements: 9.5 x 5.9 x 5.3 cm = volume: 160.4 mL. Increased parenchymal echogenicity is noted. No mass or hydronephrosis visualized. Left Kidney: Renal measurements: 11.2 x 5.6 x 5.6 cm = volume: 182.6 mL. Increased parenchymal echogenicity is noted. No mass or hydronephrosis visualized. Bladder: Decompressed, with a Foley catheter in place. Small bilateral pleural effusions are noted. IMPRESSION: 1. No evidence of hydronephrosis. 2. Increased renal parenchymal echogenicity likely reflects medical renal disease. 3. Small bilateral pleural effusions noted. Electronically Signed   By: Garald Balding M.D.   On: 01/03/2018 21:38     Scheduled Meds: . sodium chloride   Intravenous Once  . clopidogrel  75 mg Oral Daily  . darbepoetin (ARANESP) injection - NON-DIALYSIS  200 mcg Subcutaneous Q Wed-1800  . dextromethorphan-guaiFENesin  1 tablet Oral BID  . furosemide  60 mg Intravenous Q8H  . gabapentin  100 mg Oral QHS  . insulin aspart  0-9 Units Subcutaneous TID WC  . senna-docusate  1 tablet Oral QHS  . sodium chloride flush  3 mL Intravenous Q12H  . warfarin  2.5 mg Oral ONCE-1800  . Warfarin - Pharmacist Dosing Inpatient   Does not apply q1800   Continuous Infusions: . sodium chloride       LOS: 1 day   Time Spent in minutes   40 minutes  Lajoyce Tamura D.O. on 01/04/2018 at 2:44 PM  Between 7am to 7pm - Please see pager noted on [email or web address]  After 7pm go to [email or web address]  And look for the night coverage person covering for me after hours  [facility] Office  [phone]

## 2018-01-04 NOTE — Progress Notes (Signed)
ANTICOAGULATION CONSULT NOTE - Follow Up Consult  Pharmacy Consult for Coumadin Indication: atrial fibrillation  Allergies  Allergen Reactions  . Bicalutamide Other (See Comments)    Casodex caused increase in breast size  . Codeine Itching and Rash  . Niacin And Related Other (See Comments)    Burning and stinging from tablets (otc capsules ok)  . Lovastatin Other (See Comments)    Leg weakness  . Morphine Other (See Comments)    "Allergic," per MAR  . Beef Extract Rash  . Pravastatin Other (See Comments)    Myalgia    Patient Measurements: Height: 5\' 9"  (175.3 cm) Weight: 164 lb 7.4 oz (74.6 kg) IBW/kg (Calculated) : 70.7  Vital Signs: Temp: 98.3 F (36.8 C) (12/25 0514) Temp Source: Oral (12/25 0514) BP: 139/68 (12/25 0514) Pulse Rate: 99 (12/25 0514)  Labs: Recent Labs    01/03/18 1041 01/03/18 1303 01/04/18 0402  HGB  --  7.0*  --   HCT  --  21.9*  --   PLT  --  282  --   LABPROT  --   --  22.2*  INR 2.8  --  1.97  CREATININE  --  4.07* 3.95*    Estimated Creatinine Clearance: 13.4 mL/min (A) (by C-G formula based on SCr of 3.95 mg/dL (H)).  Assessment:   84 yr old male admitted 12/24 with fluid overload.  To continue Coumadin as prior to admission for atrial fibrillation and hx DVT (2013?).       Caregiver  reported 12/24 most recent Coumadin regimen is 2.5 mg daily except 5 mg on Tuesdays.  She also reports that Coumadin has been held x 2 days d/t INR 4.1 on 12/22 and 3.8 on 12/23 at SNF.  She also reports INR 3.1 this morning.  INR 2.8 at Cardiology office on 12/24.  Hx anemia, Hgb down 8.1 on 12/24 and 1 unit PRBCs given.  No CBC today. Aranesp 200 mg SQ weekly to begin today.  Also on Plavix daily for recent stent.    INR trended down to 1.97. Just below target range. Had expected some decrease, after Coumadin held 12/22 and 12/23.  Resumed on 12/24 with 2.5 mg x 1.  Hematuria noted, possibly from foley trauma.  Urine culture planned.  Goal of  Therapy:  INR 2-3 Monitor platelets by anticoagulation protocol: Yes   Plan:   Coumadin 2.5 mg again today.  Daily PT/INR.  CBC in am.  Follow up hematuria.  Arty Baumgartner, [place] Pager: [phone] or phone: [phone] 01/04/2018,1:25 PM

## 2018-01-04 NOTE — Progress Notes (Signed)
[facility] ROUNDING NOTE   Subjective:   A little more interactive today still very ill-appearing gentleman  Blood pressure 626 systolic pulse 948 irregular temperature 98.3 O2 sats 96%  Sodium 140 potassium 3.6 chloride 100 CO2 26 glucose 119 BUN 64 creatinine 3.95 calcium 8.5 AST 19 ALT 14 iron saturations 35% hemoglobin 7.0 WBC 12.5 platelets 282  Recorded urine output 1.5 L 01/04/2018  Objective:  Vital signs in last 24 hours:  Temp:  [98.3 F (36.8 C)-98.7 F (37.1 C)] 98.3 F (36.8 C) (12/25 0514) Pulse Rate:  [81-111] 99 (12/25 0514) Resp:  [18-19] 18 (12/24 1300) BP: (92-139)/(48-78) 139/68 (12/25 0514) SpO2:  [93 %-96 %] 93 % (12/25 0514) Weight:  [74.6 kg-78.2 kg] 74.6 kg (12/25 0514)  Weight change:  Filed Weights   01/03/18 1259 01/04/18 0514  Weight: 77.6 kg 74.6 kg    Intake/Output: I/O last 3 completed shifts: In: 306.7 [Blood:306.7] Out: 1500 [Urine:1500]   Intake/Output this shift:  No intake/output data recorded. Alert elderly nondistressed appears fatigued and exhausted CVS-irregular rate and rhythm tachycardic 3/6 systolic murmur RS-some rhonchorous breath sounds diminished at bases ABD- BS present soft non-distended EXT-2+ pitting edema   Basic Metabolic Panel: Recent Labs  Lab 01/03/18 1303 01/04/18 0402  NA 137 140  K 3.9 3.6  CL 99 100  CO2 25 26  GLUCOSE 129* 119*  BUN 64* 64*  CREATININE 4.07* 3.95*  CALCIUM 8.9 8.5*    Liver Function Tests: Recent Labs  Lab 01/03/18 1303  AST 19  ALT 14  ALKPHOS 50  BILITOT 0.8  PROT 6.7  ALBUMIN 2.9*   No results for input(s): LIPASE, AMYLASE in the last 168 hours. No results for input(s): AMMONIA in the last 168 hours.  CBC: Recent Labs  Lab 01/03/18 1303  WBC 12.5*  NEUTROABS 10.6*  HGB 7.0*  HCT 21.9*  MCV 102.8*  PLT 282    Cardiac Enzymes: No results for input(s): CKTOTAL, CKMB, CKMBINDEX, TROPONINI in the last 168 hours.  BNP: Invalid input(s):  POCBNP  CBG: Recent Labs  Lab 01/03/18 1705 01/03/18 2159 01/04/18 0734  GLUCAP 124* 116* 110*    Microbiology: Results for orders placed or performed during the hospital encounter of 06/18/13  Surgical pcr screen     Status: None   Collection Time: 06/18/13  9:38 AM  Result Value Ref Range Status   MRSA, PCR NEGATIVE NEGATIVE Final   Staphylococcus aureus NEGATIVE NEGATIVE Final    Comment:        The Xpert SA Assay (FDA approved for NASAL specimens in patients over 24 years of age), is one component of a comprehensive surveillance program.  Test performance has been validated by [employer] for patients greater than or equal to 60 year old. It is not intended to diagnose infection nor to guide or monitor treatment.    Coagulation Studies: Recent Labs    01/03/18 1041 01/04/18 0402  LABPROT  --  22.2*  INR 2.8 1.97    Urinalysis: Recent Labs    01/03/18 1603  COLORURINE YELLOW  LABSPEC 1.011  PHURINE 6.0  GLUCOSEU NEGATIVE  HGBUR MODERATE*  BILIRUBINUR NEGATIVE  KETONESUR NEGATIVE  PROTEINUR NEGATIVE  NITRITE NEGATIVE  LEUKOCYTESUR TRACE*      Imaging: Dg Chest 2 View  Result Date: 01/04/2018 CLINICAL DATA:  Chest congestion cough and sob x5 weeks EXAM: CHEST - 2 VIEW COMPARISON:  12/24/2017 FINDINGS: Mild hyperinflation on the lateral view. A mid to lower thoracic vertebral compression  deformity is moderate. Midline trachea. Cardiomegaly accentuated by AP portable technique. Atherosclerosis in the transverse aorta. Small left pleural effusion, decreased. Resolved interstitial edema. Improved left greater than right base airspace disease. IMPRESSION: 1. Cardiomegaly with resolution of interstitial edema. 2. Small left pleural effusion, decreased. 3. Improved bibasilar airspace disease, likely atelectasis. At the left lung base, residual infection/aspiration cannot be excluded. Electronically Signed   By: Abigail Miyamoto M.D.   On: 01/04/2018 07:19   US  Renal  Result Date: 01/03/2018 CLINICAL DATA:  Chronic renal insufficiency. Congestive heart failure and shortness of breath. EXAM: RENAL / URINARY TRACT ULTRASOUND COMPLETE COMPARISON:  CT of the chest, abdomen and pelvis performed 11/29/2017 FINDINGS: Right Kidney: Renal measurements: 9.5 x 5.9 x 5.3 cm = volume: 160.4 mL. Increased parenchymal echogenicity is noted. No mass or hydronephrosis visualized. Left Kidney: Renal measurements: 11.2 x 5.6 x 5.6 cm = volume: 182.6 mL. Increased parenchymal echogenicity is noted. No mass or hydronephrosis visualized. Bladder: Decompressed, with a Foley catheter in place. Small bilateral pleural effusions are noted. IMPRESSION: 1. No evidence of hydronephrosis. 2. Increased renal parenchymal echogenicity likely reflects medical renal disease. 3. Small bilateral pleural effusions noted. Electronically Signed   By: Garald Balding M.D.   On: 01/03/2018 21:38     Medications:   . sodium chloride     . sodium chloride   Intravenous Once  . clopidogrel  75 mg Oral Daily  . furosemide  60 mg Intravenous Q8H  . gabapentin  100 mg Oral QHS  . insulin aspart  0-9 Units Subcutaneous TID WC  . senna-docusate  1 tablet Oral QHS  . sodium chloride flush  3 mL Intravenous Q12H  . Warfarin - Pharmacist Dosing Inpatient   Does not apply q1800   sodium chloride, acetaminophen, ondansetron (ZOFRAN) IV, sodium chloride flush  Assessment/ Plan:   Acute on chronic renal failure with some progression of his renal insufficiency his last creatinine was 2.7 19 December 2017 on admission is increased to 4.07 is slightly better this morning.  He had significant heart failure and hypotension I believe this is the etiology of his worsening renal failure.  His urinalysis showed moderate blood greater than 50 RBCs per high-powered film negative for protein specific gravity 1.011 he had 11-20 WBCs per high-powered field nitrates negative leukocyte esterase trace.  Renal ultrasound no  evidence of hydronephrosis increased renal parenchymal echogenicity and bilateral pleural effusions.  The urine is bloody and this could be secondary to Foley trauma, patient also on anticoagulation with Coumadin.  I do not think this represents an acute glomerulonephritis.  He may have a urinary tract infection and was sent urine for culture.  There may be an element of interstitial nephritis as he does have some white cells in his urine.  I think at this point this would be academic.  His atrial fibrillation appears to have worsened.  I will defer to cardiology  Hypertension/volume.  Patient is hypotensive I do not think he would tolerate dialysis even though he has an AV fistula that is mature we have discontinued some of his negative inotropic medications continue to avoid nonsteroidal anti-inflammatory drugs  Chronic kidney disease stage IV/V continue to monitor blood pressure problematic and I do not think he would tolerate dialysis  Anemia iron saturations adequate start darbepoetin  Bones PTH pending  Atrial fibrillation patient anticoagulated.  This could be causing hematuria with Foley.  History of prostate cancer status post radioactive seed placement.   LOS: 1 Hassell Done  Smith Robert @TODAY @9 :28 AM

## 2018-01-05 ENCOUNTER — Inpatient Hospital Stay (HOSPITAL_COMMUNITY): Payer: Medicare Other

## 2018-01-05 ENCOUNTER — Encounter (HOSPITAL_COMMUNITY): Payer: Self-pay | Admitting: Physician Assistant

## 2018-01-05 DIAGNOSIS — I5032 Chronic diastolic (congestive) heart failure: Secondary | ICD-10-CM

## 2018-01-05 DIAGNOSIS — N184 Chronic kidney disease, stage 4 (severe): Secondary | ICD-10-CM

## 2018-01-05 LAB — BASIC METABOLIC PANEL
Anion gap: 13 (ref 5–15)
BUN: 72 mg/dL — ABNORMAL HIGH (ref 8–23)
CALCIUM: 8.5 mg/dL — AB (ref 8.9–10.3)
CO2: 24 mmol/L (ref 22–32)
Chloride: 99 mmol/L (ref 98–111)
Creatinine, Ser: 3.79 mg/dL — ABNORMAL HIGH (ref 0.61–1.24)
GFR calc Af Amer: 16 mL/min — ABNORMAL LOW (ref >60)
GFR calc non Af Amer: 14 mL/min — ABNORMAL LOW (ref >60)
Glucose, Bld: 112 mg/dL — ABNORMAL HIGH (ref 70–99)
Potassium: 3.1 mmol/L — ABNORMAL LOW (ref 3.5–5.1)
Sodium: 136 mmol/L (ref 135–145)

## 2018-01-05 LAB — CBC
HCT: 24.7 % — ABNORMAL LOW (ref 39.0–52.0)
Hemoglobin: 8.7 g/dL — ABNORMAL LOW (ref 13.0–17.0)
MCH: 35.2 pg — ABNORMAL HIGH (ref 26.0–34.0)
MCHC: 35.2 g/dL (ref 30.0–36.0)
MCV: 100 fL (ref 80.0–100.0)
Platelets: 247 10*3/uL (ref 150–400)
RBC: 2.47 MIL/uL — ABNORMAL LOW (ref 4.22–5.81)
RDW: 19.4 % — ABNORMAL HIGH (ref 11.5–15.5)
WBC: 9.1 10*3/uL (ref 4.0–10.5)
nRBC: 1 % — ABNORMAL HIGH (ref 0.0–0.2)

## 2018-01-05 LAB — GLUCOSE, CAPILLARY
Glucose-Capillary: 103 mg/dL — ABNORMAL HIGH (ref 70–99)
Glucose-Capillary: 176 mg/dL — ABNORMAL HIGH (ref 70–99)
Glucose-Capillary: 131 mg/dL — ABNORMAL HIGH (ref 70–99)

## 2018-01-05 LAB — OCCULT BLOOD X 1 CARD TO LAB, STOOL: Fecal Occult Bld: POSITIVE — AB

## 2018-01-05 LAB — PROTIME-INR
INR: 1.89
Prothrombin Time: 21.5 seconds — ABNORMAL HIGH (ref 11.4–15.2)

## 2018-01-05 LAB — TYPE AND SCREEN
ABO/RH(D): A NEG
ANTIBODY SCREEN: NEGATIVE
Unit division: 0
Unit division: 0

## 2018-01-05 LAB — BPAM RBC
Blood Product Expiration Date: 201912282359
Blood Product Expiration Date: 202001032359
ISSUE DATE / TIME: 201912250027
ISSUE DATE / TIME: 201912251957
Unit Type and Rh: 600
Unit Type and Rh: 600

## 2018-01-05 LAB — PARATHYROID HORMONE, INTACT (NO CA): PTH: 44 pg/mL (ref 15–65)

## 2018-01-05 MED ORDER — AMIODARONE HCL IN DEXTROSE 360-4.14 MG/200ML-% IV SOLN
60.0000 mg/h | INTRAVENOUS | Status: AC
  Administered 2018-01-05 – 2018-01-06 (×2): 60 mg/h via INTRAVENOUS
  Filled 2018-01-05 (×2): qty 200

## 2018-01-05 MED ORDER — POTASSIUM CHLORIDE CRYS ER 20 MEQ PO TBCR
30.0000 meq | EXTENDED_RELEASE_TABLET | Freq: Once | ORAL | Status: AC
  Administered 2018-01-05: 30 meq via ORAL
  Filled 2018-01-05: qty 1

## 2018-01-05 MED ORDER — METOPROLOL TARTRATE 25 MG PO TABS: 25.0000 mg | ORAL_TABLET | Freq: Two times a day (BID) | ORAL | Status: DC

## 2018-01-05 MED ORDER — ALPRAZOLAM 0.25 MG PO TABS
0.2500 mg | ORAL_TABLET | Freq: Four times a day (QID) | ORAL | Status: DC | PRN
  Administered 2018-01-05 – 2018-01-09 (×5): 0.25 mg via ORAL
  Filled 2018-01-05 (×5): qty 1

## 2018-01-05 MED ORDER — POTASSIUM CHLORIDE CRYS ER 20 MEQ PO TBCR
40.0000 meq | EXTENDED_RELEASE_TABLET | ORAL | Status: AC
  Administered 2018-01-05 (×2): 40 meq via ORAL
  Filled 2018-01-05 (×2): qty 2

## 2018-01-05 MED ORDER — TAMSULOSIN HCL 0.4 MG PO CAPS
0.4000 mg | ORAL_CAPSULE | Freq: Every day | ORAL | Status: DC
  Administered 2018-01-05 – 2018-01-09 (×5): 0.4 mg via ORAL
  Filled 2018-01-05 (×5): qty 1

## 2018-01-05 MED ORDER — SENNA 8.6 MG PO TABS
2.0000 | ORAL_TABLET | Freq: Every day | ORAL | Status: DC
  Administered 2018-01-05 – 2018-01-09 (×5): 17.2 mg via ORAL
  Filled 2018-01-05 (×5): qty 2

## 2018-01-05 MED ORDER — ALBUTEROL SULFATE (2.5 MG/3ML) 0.083% IN NEBU: 3.0000 mL | INHALATION_SOLUTION | Freq: Four times a day (QID) | RESPIRATORY_TRACT | Status: DC | PRN

## 2018-01-05 MED ORDER — METOPROLOL TARTRATE 25 MG PO TABS
25.0000 mg | ORAL_TABLET | Freq: Two times a day (BID) | ORAL | Status: DC
  Administered 2018-01-05 – 2018-01-09 (×5): 25 mg via ORAL
  Filled 2018-01-05 (×7): qty 1

## 2018-01-05 MED ORDER — BICALUTAMIDE 50 MG PO TABS
50.0000 mg | ORAL_TABLET | Freq: Every day | ORAL | Status: DC
  Administered 2018-01-05 – 2018-01-10 (×6): 50 mg via ORAL
  Filled 2018-01-05 (×6): qty 1

## 2018-01-05 MED ORDER — AMIODARONE HCL IN DEXTROSE 360-4.14 MG/200ML-% IV SOLN
30.0000 mg/h | INTRAVENOUS | Status: DC
  Administered 2018-01-06 – 2018-01-08 (×4): 30 mg/h via INTRAVENOUS
  Filled 2018-01-05 (×4): qty 200

## 2018-01-05 MED ORDER — WARFARIN SODIUM 2.5 MG PO TABS
2.5000 mg | ORAL_TABLET | Freq: Once | ORAL | Status: AC
  Administered 2018-01-05: 2.5 mg via ORAL
  Filled 2018-01-05: qty 1

## 2018-01-05 MED ORDER — B COMPLEX-C PO TABS
1.0000 | ORAL_TABLET | Freq: Every day | ORAL | Status: DC
  Administered 2018-01-06 – 2018-01-10 (×5): 1 via ORAL
  Filled 2018-01-05 (×5): qty 1

## 2018-01-05 MED ORDER — PANTOPRAZOLE SODIUM 40 MG PO TBEC
40.0000 mg | DELAYED_RELEASE_TABLET | Freq: Every day | ORAL | Status: DC
  Administered 2018-01-05 – 2018-01-10 (×6): 40 mg via ORAL
  Filled 2018-01-05 (×6): qty 1

## 2018-01-05 MED ORDER — ADULT MULTIVITAMIN W/MINERALS CH
1.0000 | ORAL_TABLET | Freq: Every day | ORAL | Status: DC
  Administered 2018-01-06 – 2018-01-10 (×5): 1 via ORAL
  Filled 2018-01-05 (×5): qty 1

## 2018-01-05 MED ORDER — GABAPENTIN 100 MG PO CAPS: 100.0000 mg | ORAL_CAPSULE | Freq: Every day | ORAL | Status: DC

## 2018-01-05 MED ORDER — ACETAMINOPHEN 500 MG PO TABS: 500.0000 mg | ORAL_TABLET | Freq: Four times a day (QID) | ORAL | Status: DC | PRN

## 2018-01-05 MED ORDER — METOPROLOL TARTRATE 5 MG/5ML IV SOLN
2.5000 mg | INTRAVENOUS | Status: DC | PRN
  Administered 2018-01-05: 2.5 mg via INTRAVENOUS
  Filled 2018-01-05: qty 5

## 2018-01-05 MED ORDER — ALUM & MAG HYDROXIDE-SIMETH 200-200-20 MG/5ML PO SUSP: 30.0000 mL | ORAL | Status: DC | PRN

## 2018-01-05 NOTE — Progress Notes (Signed)
Called by RN regarding rapid atrial fibrillation:  Patient went into atrial fibrillation earlier today and his heart rate is elevated, in the 120s-140s. - He has had all of his doses of Cardizem today, has not missed any.  He has not had his evening dose of metoprolol.  I requested that the evening dose of metoprolol be given now.  Systolic blood pressure is 118.  I also wrote for as needed IV metoprolol for heart rate greater than 120.  Rosaria Ferries, PA-C 01/05/2018 6:51 PM Beeper [phone]

## 2018-01-05 NOTE — Progress Notes (Signed)
PROGRESS NOTE    Greg Booker  FTD:[id number] DOB: 04-Jan-1930 DOA: 01/03/2018 PCP: Raina Mina., MD   Brief Narrative:  HPI on 01/03/2018 by Ms. Jory Sims, NP (cardiology) Greg Booker is a 84 y.o. male who presents for ongoing assessment and management of CAD with most recent cath in 2006 revealed an occluded RCA with collaterals. Hx of DVT on coumadin  therapy.She is followed in our coumadin clinic and was last seen by them on 12/01/2017.   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 Had cardiac cath at [facility] on 12/12/2017 requiring DES to the RCA. Now on Plavix.  He was last seem by Dr. Debara Pickett on 10/17/2017 for and had recent echocardiogram revealing a LVEF of 60%-65% and moderate Aortic stenosis. He had been admitted for acute renal failure which did not require dialysis. He was scheduled for a fistula placement.   He comes today clearly fluid overloaded with 2+3+ pitting edema in the right leg, having gained over 10 lbs. He was advised to go to ER yesterday but wanted to wait for this appointment. He is dyspneic, falls asleep easily. His skin is spilling open and weeping on the lower right extremity.   He has been a patient of [facility] for rehab and has not been adhering to low sodium diet. He has been having worsening cough and congestion.    Interim history [facility] assumed care of this patient as he had multiple medical problems.  Patient was admitted by cardiology for volume overload/CHF.  Assessment & Plan   Acute kidney injury on chronic kidney disease, stage IV -Patient's baseline creatinine around 2.5, however on admission was 4.07 -Nephrology consulted and appreciated, may have urinary tract infection, interstitial nephritis.  Does not feel patient would be a hemodialysis candidate given his hypotension. -will continue to monitor BMP closely as patient will be getting  IV diuresis -Creatinine currently 3.95 -Renal ultrasound showed no evidence of hydronephrosis.    Acute on chronic combined CHF -Echocardiogram August 2019 showed an EF of 45 to 50%, diffuse hypokinesis, grade 2 diastolic dysfunction -BNP 1394 -Patient currently on IV Lasix 60 mg 3 times daily -Monitor intake and output, daily weights -Cardiology following -Echocardiogram EF 30-35%, diffuse hypokinesis.   Anemia -Likely multifactorial including chronic kidney disease, volume overload with CHF -hemoglobin on admission was 7, patient was given 1 unit PRBC -Upon review of patient's chart, hemoglobin was approximately 9-10 in 2018 (per care everywhere) -Hemoglobin 8.7 -Anemia panel showed adequate iron -Nephrology starting darbepoetin -FOBT + -will discuss with gastroenterology -Reviewed patient's chart via care everywhere, it seems that he has been following up with gastroenterology [employer], note from 11/22/2017 noted that patient and daughter deferred a small bowel capsule endoscopy as patient's hemoglobin was stable and no bleeding was noted. -He also has had a EGD showing mild nonspecific gastropathy.  Colonoscopy showed a 7 mm sessile sigmoid polyp which was removed, grade 2 internal hemorrhoids.  Aortic stenosis -Echocardiogram showed moderate to moderate severe, valve severely calcified with mildly reduced leaflet mobility.  Mean systolic gradient through valve is 28 mmHg -Cardiology following  Chronic atrial fibrillation -Continue Coumadin, diltiazem, metoprolol  Coronary artery disease -Patient had catheterization earlier this month with drug-eluting stent placed in the RCA. -Currently no complaints of chest pain -Continue Plavix and management per cardiology  Diabetes mellitus, type II -Continue insulin sliding scale and  CBG monitoring  Right lateral heel decubitus ulcer -Wound care consulted  History of prostate cancer -Status post radioactive seed  placement -? Source of anemia in conjunction with coumadin   DVT Prophylaxis  Coumadin  Code Status: Full  Family Communication: None at bedside  Disposition Plan: Admitted. Pending cardiology and nephrology recommendations for CHF and CKD.  We will also discuss with gastroenterology  Consultants Nephrology Cardiology Gastroenterology  Procedures  Echcoardiogram  Antibiotics   Anti-infectives (From admission, onward)   None      Subjective:   Greg Booker seen and examined today.  Feeling mildly better today.  Continues to have some weakness.  Feels swelling has improved and his breathing has also improved but not back to his baseline.  Denies current chest pain, abdominal pain, nausea or vomiting, diarrhea or constipation, dizziness or headache. Objective:   Vitals:   01/05/18 0052 01/05/18 0603 01/05/18 0809 01/05/18 1211  BP: (!) 105/54 (!) 128/100 (!) 102/40 114/70  Pulse:  (!) 119 (!) 102   Resp:  19 18   Temp:  98.7 F (37.1 C) 98.5 F (36.9 C)   TempSrc:  Oral Oral   SpO2:  98% 99%   Weight:  71.3 kg    Height:        Intake/Output Summary (Last 24 hours) at 01/05/2018 1433 Last data filed at 01/05/2018 1239 Gross per 24 hour  Intake 316 ml  Output 2900 ml  Net -2584 ml   Filed Weights   01/03/18 1259 01/04/18 0514 01/05/18 0603  Weight: 77.6 kg 74.6 kg 71.3 kg   Exam  General: Well developed, chronically ill appearing, NAD  HEENT: NCAT,  mucous membranes moist.   Neck: Supple  Cardiovascular: S1 S2 auscultated, 3/6 SEM, RRR  Respiratory: Clear to auscultation bilaterally with equal chest rise  Abdomen: Soft, nontender, nondistended, + bowel sounds  Extremities: Left AKA. RLE edema-improved  Neuro: AAOx3,  nonfocal  Psych: Normal affect and demeanor    Data Reviewed: I have personally reviewed following labs and imaging studies  CBC: Recent Labs  Lab 01/03/18 1303 01/04/18 1451 01/05/18 0326  WBC 12.5*  --  9.1  NEUTROABS  10.6*  --   --   HGB 7.0* 7.0* 8.7*  HCT 21.9* 20.8* 24.7*  MCV 102.8*  --  100.0  PLT 282  --  660   Basic Metabolic Panel: Recent Labs  Lab 01/03/18 1303 01/04/18 0402 01/05/18 0326  NA 137 140 136  K 3.9 3.6 3.1*  CL 99 100 99  CO2 25 26 24   GLUCOSE 129* 119* 112*  BUN 64* 64* 72*  CREATININE 4.07* 3.95* 3.79*  CALCIUM 8.9 8.5* 8.5*   GFR: Estimated Creatinine Clearance: 14 mL/min (A) (by C-G formula based on SCr of 3.79 mg/dL (H)). Liver Function Tests: Recent Labs  Lab 01/03/18 1303  AST 19  ALT 14  ALKPHOS 50  BILITOT 0.8  PROT 6.7  ALBUMIN 2.9*   No results for input(s): LIPASE, AMYLASE in the last 168 hours. No results for input(s): AMMONIA in the last 168 hours. Coagulation Profile: Recent Labs  Lab 01/03/18 1041 01/04/18 0402 01/05/18 0326  INR 2.8 1.97 1.89   Cardiac Enzymes: No results for input(s): CKTOTAL, CKMB, CKMBINDEX, TROPONINI in the last 168 hours. BNP (last 3 results) No results for input(s): PROBNP in the last 8760 hours. HbA1C: No results for input(s): HGBA1C in the last 72 hours. CBG: Recent Labs  Lab 01/04/18 1127 01/04/18 1609 01/04/18 2126 01/05/18 6301 01/05/18  1147  GLUCAP 124* 129* 114* 103* 176*   Lipid Profile: No results for input(s): CHOL, HDL, LDLCALC, TRIG, CHOLHDL, LDLDIRECT in the last 72 hours. Thyroid Function Tests: No results for input(s): TSH, T4TOTAL, FREET4, T3FREE, THYROIDAB in the last 72 hours. Anemia Panel: Recent Labs    01/03/18 1657  TIBC 231*  IRON 80   Urine analysis:    Component Value Date/Time   COLORURINE YELLOW 01/03/2018 1603   APPEARANCEUR CLEAR 01/03/2018 1603   LABSPEC 1.011 01/03/2018 1603   PHURINE 6.0 01/03/2018 1603   GLUCOSEU NEGATIVE 01/03/2018 1603   HGBUR MODERATE (A) 01/03/2018 1603   BILIRUBINUR NEGATIVE 01/03/2018 1603   KETONESUR NEGATIVE 01/03/2018 1603   PROTEINUR NEGATIVE 01/03/2018 1603   UROBILINOGEN 0.2 10/29/2011 0945   NITRITE NEGATIVE 01/03/2018 1603    LEUKOCYTESUR TRACE (A) 01/03/2018 1603   Sepsis Labs: @LABRCNTIP (procalcitonin:4,lacticidven:4)  )No results found for this or any previous visit (from the past 240 hour(s)).    Radiology Studies: Dg Chest 2 View  Result Date: 01/05/2018 CLINICAL DATA:  Cough. EXAM: CHEST - 2 VIEW COMPARISON:  01/04/2018 FINDINGS: Stable mild enlargement of the cardiopericardial silhouette. Increased retrocardiac airspace opacity. Atherosclerotic calcification of the aortic arch. Calcified granulomas at the right lung base. Bilateral interstitial accentuation similar to prior. Blunting of both costophrenic angles posteriorly. Bony demineralization. IMPRESSION: 1. Cardiomegaly with interstitial edema. 2. Small bilateral pleural effusions. Increased airspace opacity in the retrocardiac region potentially from increased atelectasis or pneumonia. 3. Old granulomatous disease. Electronically Signed   By: Van Clines M.D.   On: 01/05/2018 09:15   Dg Chest 2 View  Result Date: 01/04/2018 CLINICAL DATA:  Chest congestion cough and sob x5 weeks EXAM: CHEST - 2 VIEW COMPARISON:  12/24/2017 FINDINGS: Mild hyperinflation on the lateral view. A mid to lower thoracic vertebral compression deformity is moderate. Midline trachea. Cardiomegaly accentuated by AP portable technique. Atherosclerosis in the transverse aorta. Small left pleural effusion, decreased. Resolved interstitial edema. Improved left greater than right base airspace disease. IMPRESSION: 1. Cardiomegaly with resolution of interstitial edema. 2. Small left pleural effusion, decreased. 3. Improved bibasilar airspace disease, likely atelectasis. At the left lung base, residual infection/aspiration cannot be excluded. Electronically Signed   By: Abigail Miyamoto M.D.   On: 01/04/2018 07:19   US Renal  Result Date: 01/03/2018 CLINICAL DATA:  Chronic renal insufficiency. Congestive heart failure and shortness of breath. EXAM: RENAL / URINARY TRACT ULTRASOUND  COMPLETE COMPARISON:  CT of the chest, abdomen and pelvis performed 11/29/2017 FINDINGS: Right Kidney: Renal measurements: 9.5 x 5.9 x 5.3 cm = volume: 160.4 mL. Increased parenchymal echogenicity is noted. No mass or hydronephrosis visualized. Left Kidney: Renal measurements: 11.2 x 5.6 x 5.6 cm = volume: 182.6 mL. Increased parenchymal echogenicity is noted. No mass or hydronephrosis visualized. Bladder: Decompressed, with a Foley catheter in place. Small bilateral pleural effusions are noted. IMPRESSION: 1. No evidence of hydronephrosis. 2. Increased renal parenchymal echogenicity likely reflects medical renal disease. 3. Small bilateral pleural effusions noted. Electronically Signed   By: Garald Balding M.D.   On: 01/03/2018 21:38     Scheduled Meds: . clopidogrel  75 mg Oral Daily  . darbepoetin (ARANESP) injection - NON-DIALYSIS  200 mcg Subcutaneous Q Wed-1800  . dextromethorphan-guaiFENesin  1 tablet Oral BID  . diltiazem  30 mg Oral Q6H  . furosemide  60 mg Intravenous Q8H  . gabapentin  100 mg Oral QHS  . insulin aspart  0-9 Units Subcutaneous TID WC  . metoprolol tartrate  12.5 mg Oral BID  . senna-docusate  1 tablet Oral QHS  . sodium chloride flush  3 mL Intravenous Q12H  . Warfarin - Pharmacist Dosing Inpatient   Does not apply q1800   Continuous Infusions: . sodium chloride       LOS: 2 days   Time Spent in minutes   45 minutes (greater than 50% of time spent with patient face to face, as well as reviewing old records, calling consults, and formulating a plan)  Tasfia Vasseur D.O. on 01/05/2018 at 2:33 PM  Between 7am to 7pm - Please see pager noted on [email or web address]  After 7pm go to [email or web address]  And look for the night coverage person covering for me after hours  [facility] Office  [phone]

## 2018-01-05 NOTE — Consult Note (Signed)
Consultation Note Date: 01/05/2018   Patient Name: Greg Booker  DOB: [birth year]  MRN: [id number]  Age / Sex: 84 y.o., male  PCP: Raina Mina., MD Referring Physician: Cristal Ford, DO  Reason for Consult tion: Establishing goals of care  HPI/Patient Profile: 84 y.o. male  with past medical history of diastolic CHF, HTN, CAD, HLD, mitral regurgitation, atrial fibrillation (on Coumadin), diabetes 2, CKD stage 3-4, PAD, h/o prostate cancer, h/o left foot 3 toes amputated admitted on 01/03/2018 from SNF (rehab s/p toe amputation) with weight gain, cough, congestion r/t CHF exacerbation and acute on chronic kidney disease. Also with anemia of chronic disease and new right heel stage 3 decubitus ulcer.   Clinical Assessment and Goals of Care: I met today with Greg Booker along with his caregiver/friend, [name], and his sister and great nephew at bedside. We discussed the concern with his worsening heart disease and worsening renal failure. We discussed that he is a poor candidate for dialysis because of his cardiac disease. We discussed the risks of interventions in helping one disease will hurt his other organs. I encouraged him to consider his options and the expectations of quality of life.   We discussed code status and I reviewed HCPOA/Living Will with them. [name] explains that he completed Living Will many years ago with his wife who has since died - he is invested in reviewing this Living Will. We discussed that at the end of his life he believes he would like to be at home - I encouraged consideration of DNR with this goal.   We were interrupted by other visitors but [name] will continue to work with [name] and discuss his [place]. They are planning on returning home from the hospital instead of back to rehab.   Primary Decision Maker PATIENT    SUMMARY OF RECOMMENDATIONS   - Continue  conversation regarding his wishes and [place] - Recommend outpatient palliative to follow (if available in his area)  Code Status/Advance Care Planning:  Full code - discussed consideration of DNR   Symptom Management:   Per attending, cardiology, and renal.   Palliative Prophylaxis:   Delirium Protocol and Frequent Pain Assessment  Psycho-social/Spiritual:   Desire for further Chaplaincy support:yes  Additional Recommendations: Caregiving  Support/Resources and Education on Hospice  Prognosis:   Overall prognosis very poor with chronic multiorgan dysfunction.   Discharge Planning: Home with Palliative Services      Primary Diagnoses: Present on Admission: . Acute on chronic systolic heart failure ([place]) . Anemia . CKD (chronic kidney disease) . PVD (peripheral vascular disease) with claudication ([place]) . CAD (coronary artery disease) . Diabetic peripheral vascular disease ([place])   I have reviewed the medical record, interviewed the patient and family, and examined the patient. The following aspects are pertinent.  Past Medical History:  Diagnosis Date  . Anticoagulated on Coumadin   . Anxiety    "just in the last week" (01/03/2018)  . Arthritis   . Calcified granuloma of lung ([place]) RIGHT LUNG BASE -  STABLE PER CXR 03-03-2010  . Chronic mid back pain    "fell and broke my back in ~ 2018" (01/03/2018)  . CKD (chronic kidney disease), stage III ([place])   . COPD (chronic obstructive pulmonary disease) ([place])   . Coronary artery disease    a. 1997 PCI/BMS to the RCA; b. 2006 Cath: Occluded RCA with collat flow, EF 60%, otw nonobs dzs; b. 2010 MV: No ischemia; c. 06/2013 MV: low risk w/o ischemia.  . Falls   . GERD (gastroesophageal reflux disease)   . Heart murmur   . History of blood transfusion 12/2017   "related to anemia" (01/03/2018)  . History of DVT (deep vein thrombosis)    a. Early 2000's.; "that's why I lost my LLE" (01/03/2018)  . Hyperlipidemia   .  Hypertensive heart disease   . Impaired hearing BILATERAL AIDS  . Low back pain radiating to left leg   . Mitral regurgitation   . Nocturia   . Numbness and tingling of left leg   . PAF (paroxysmal atrial fibrillation) ([facility])    a. CHA2DS2VASc = 5-->chronic coumadin.  . Pneumonia 01/2013; 01/03/2018  . Prostate cancer ([place]) RECUR   a. 2006 S/P radioactive seed implant.; S/P "froze it"  . Sciatic nerve pain LEFT LEG PAIN--  EPI INJECTIONS  . Type 2 diabetes, diet controlled ([place])   . Urgency of urination    Social History   Socioeconomic History  . Marital status: Widowed    Spouse name: Not on file  . Number of children: Not on file  . Years of education: Not on file  . Highest education level: Not on file  Occupational History  . Not on file  Social Needs  . Financial resource strain: Not on file  . Food insecurity:    Worry: Not on file    Inability: Not on file  . Transportation needs:    Medical: Not on file    Non-medical: Not on file  Tobacco Use  . Smoking status: Former Smoker    Packs/day: 1.00    Years: 25.00    Pack years: 25.00    Types: Cigarettes, Pipe    Last attempt to quit: 02/23/1972    Years since quitting: 45.8  . Smokeless tobacco: Never Used  Substance and Sexual Activity  . Alcohol use: Not Currently  . Drug use: No  . Sexual activity: Not on file  Lifestyle  . Physical activity:    Days per week: Not on file    Minutes per session: Not on file  . Stress: Not on file  Relationships  . Social connections:    Talks on phone: Not on file    Gets together: Not on file    Attends religious service: Not on file    Active member of club or organization: Not on file    Attends meetings of clubs or organizations: Not on file    Relationship status: Not on file  Other Topics Concern  . Not on file  Social History Narrative   Lives with male friend in a one story home with basement.  Has no children.     Retired from Architect,  Hospital doctor, Therapist, music.     Education: some college.    Family History  Problem Relation Age of Onset  . Arthritis Mother   . Other Mother 67  . Heart Problems Father   . Heart disease Brother   . Breast cancer Sister   .  Cancer Sister   . Heart Problems Brother    Scheduled Meds: . sodium chloride   Intravenous Once  . sodium chloride   Intravenous Once  . clopidogrel  75 mg Oral Daily  . darbepoetin (ARANESP) injection - NON-DIALYSIS  200 mcg Subcutaneous Q Wed-1800  . dextromethorphan-guaiFENesin  1 tablet Oral BID  . diltiazem  30 mg Oral Q6H  . furosemide  60 mg Intravenous Q8H  . gabapentin  100 mg Oral QHS  . insulin aspart  0-9 Units Subcutaneous TID WC  . metoprolol tartrate  12.5 mg Oral BID  . potassium chloride  40 mEq Oral Q4H  . senna-docusate  1 tablet Oral QHS  . sodium chloride flush  3 mL Intravenous Q12H  . Warfarin - Pharmacist Dosing Inpatient   Does not apply q1800   Continuous Infusions: . sodium chloride     PRN Meds:.sodium chloride, acetaminophen, ondansetron (ZOFRAN) IV, sodium chloride flush Allergies  Allergen Reactions  . Bicalutamide Other (See Comments)    Casodex caused increase in breast size  . Codeine Itching and Rash  . Niacin And Related Other (See Comments)    Burning and stinging from tablets (otc capsules ok)  . Lovastatin Other (See Comments)    Leg weakness  . Morphine Other (See Comments)    "Allergic," per MAR  . Beef Extract Rash  . Pravastatin Other (See Comments)    Myalgia   Review of Systems  Constitutional: Positive for activity change.  Neurological: Positive for weakness.    Physical Exam Vitals signs and nursing note reviewed.  Constitutional:      Appearance: He is well-groomed. He is ill-appearing.  Cardiovascular:     Rate and Rhythm: Tachycardia present. Rhythm irregularly irregular.  Pulmonary:     Effort: Pulmonary effort is normal. No tachypnea, accessory muscle usage or  respiratory distress.     Comments: + congested, productive cough Neurological:     Mental Status: He is alert and oriented to person, place, and time.     Comments: Unclear how much he truly understands regarding the complexity of his health situation and circumstances     Vital Signs: BP (!) 102/40 (BP Location: Right Arm)   Pulse (!) 102   Temp 98.5 F (36.9 C) (Oral)   Resp 18   Ht '5\' 9"'  (1.753 m)   Wt 71.3 kg   SpO2 99%   BMI 23.21 kg/m  Pain Scale: 0-10   Pain Score: 0-No pain   SpO2: SpO2: 99 % O2 Device:SpO2: 99 % O2 Flow Rate: .   IO: Intake/output summary:   Intake/Output Summary (Last 24 hours) at 01/05/2018 1055 Last data filed at 01/05/2018 0865 Gross per 24 hour  Intake 556 ml  Output 2750 ml  Net -2194 ml    LBM: Last BM Date: 01/02/18(per patient) Baseline Weight: Weight: 77.6 kg Most recent weight: Weight: 71.3 kg     Palliative Assessment/Data:     Time In: 1100 Time Out: 1200 Time Total: 60 min Greater than 50%  of this time was spent counseling and coordinating care related to the above assessment and plan.  Signed by: Vinie Sill, NP Palliative Medicine Team Pager # [phone] (M-F 8a-5p) Team Phone # [phone] (Nights/Weekends)

## 2018-01-05 NOTE — Progress Notes (Signed)
ANTICOAGULATION CONSULT NOTE - Follow Up Consult  Pharmacy Consult for Coumadin Indication: atrial fibrillation  Allergies  Allergen Reactions  . Bicalutamide Other (See Comments)    Casodex caused increase in breast size 01/05/18: confirmed still taking this medication  . Codeine Itching and Rash  . Niacin And Related Other (See Comments)    Burning and stinging from tablets (otc capsules ok)  . Lovastatin Other (See Comments)    Leg weakness  . Morphine Other (See Comments)    "Allergic," per MAR  . Beef Extract Rash  . Pravastatin Other (See Comments)    Myalgia    Patient Measurements: Height: 5\' 9"  (175.3 cm) Weight: 157 lb 3.2 oz (71.3 kg) IBW/kg (Calculated) : 70.7  Vital Signs: Temp: 98.5 F (36.9 C) (12/26 0809) Temp Source: Oral (12/26 0809) BP: 114/70 (12/26 1211) Pulse Rate: 102 (12/26 0809)  Labs: Recent Labs    01/03/18 1041  01/03/18 1303 01/04/18 0402 01/04/18 1451 01/05/18 0326  HGB  --    < > 7.0*  --  7.0* 8.7*  HCT  --   --  21.9*  --  20.8* 24.7*  PLT  --   --  282  --   --  247  LABPROT  --   --   --  22.2*  --  21.5*  INR 2.8  --   --  1.97  --  1.89  CREATININE  --   --  4.07* 3.95*  --  3.79*   < > = values in this interval not displayed.    Estimated Creatinine Clearance: 14 mL/min (A) (by C-G formula based on SCr of 3.79 mg/dL (H)).  Assessment:   84 yr old male admitted 12/24 with fluid overload.  To continue Coumadin as prior to admission for atrial fibrillation and hx DVT (2013?).       Caregiver  reported 12/24 most recent Coumadin regimen is 2.5 mg daily except 5 mg on Tuesdays.  She also reports that Coumadin has been held x 2 days d/t INR 4.1 on 12/22 and 3.8 on 12/23 at SNF.  She also reports INR 3.1 this morning.  INR 2.8 at Cardiology office on 12/24.  Hx anemia, Hgb down 8.1 on 12/24 and 1 unit PRBCs given, Hgb 8.7 today. Aranesp 200 mg SQ weekly begun on 12/25.  Also on Plavix daily for recent stent.    INR trended down  to 1.89. Just below target range. Had expected some decrease, after Coumadin held 12/22 and 12/23. Coumadin 2.5 mg given the last 2 days. Hematuria noted yesterday, possibly from foley trauma.  Foley now out and urine reported clear, but FOB positive. PPI daily added as prior to admission.  Goal of Therapy:  INR 2-3 Monitor platelets by anticoagulation protocol: Yes   Plan:   Coumadin 2.5 mg again today.  Daily PT/INR.   Follow up FOB checks, monitor for any bleeding.  Arty Baumgartner, [place] Pager: [phone] or phone: [phone] 01/05/2018,4:37 PM

## 2018-01-05 NOTE — Progress Notes (Signed)
To clarify the medical record, there is a cath report from 12/12/2017 by Dr. Marijo File at [facility] indicating he underwent LHC and was found to have a CTO of the RCA (known since cath in 2006) and new severe LAD stenosis - he had a DES placed to the LAD.  This procedure was scheduled earlier, but delayed due to kidney disease. It is feasible that dye load could also have contributed to AKI. EF at the time of cath was 50-55%, so his LV dysfunction is new in the last few weeks. I suspect it may be related to afib with RVR, although his afib is permanent.  Rates have been poorly controlled here. Will increase metoprolol to 25 mg BID. Continue current dose cardizem - BP will limit dose titration.  Pixie Casino, MD, [facility], [place] Director of the Advanced Lipid Disorders &  Cardiovascular Risk Reduction Clinic Diplomate of the American Board of Clinical Lipidology Attending Cardiologist  Direct Dial: [phone]  Fax: [phone]  Website:  [email or web address]

## 2018-01-05 NOTE — Progress Notes (Addendum)
DAILY PROGRESS NOTE   Patient Name: RASHID WHITENIGHT Date of Encounter: 01/05/2018  Chief Complaint   12/24: Tired, SOB  Patient Profile   84 yo male with AKI on CKD, acute decompensated diastolic CHF, PAF, PAD s/p amputation and anemia, admitted 12/24 for CHF.  Subjective   Breathing better now, edema improved [facility], no dietary restrictions practiced. Was getting hidden sodium in foods. Had colonoscopy several weeks ago w/ Dr Lacinda Axon in [place]  Objective   Vitals:   01/04/18 2238 01/04/18 2313 01/05/18 0052 01/05/18 0603  BP:  118/88 (!) 105/54 (!) 128/100  Pulse: 66   (!) 119  Resp: 16   19  Temp:  98.2 F (36.8 C)  98.7 F (37.1 C)  TempSrc:  Oral  Oral  SpO2: 99%   98%  Weight:    71.3 kg  Height:        Intake/Output Summary (Last 24 hours) at 01/05/2018 0740 Last data filed at 01/05/2018 0617 Gross per 24 hour  Intake 556 ml  Output 2450 ml  Net -1894 ml   Filed Weights   01/03/18 1259 01/04/18 0514 01/05/18 0603  Weight: 77.6 kg 74.6 kg 71.3 kg    Physical Exam   General appearance: alert, no distress and pale Neck: JVD - 9 cm above sternal notch, no carotid bruit and thyroid not enlarged, symmetric, no tenderness/mass/nodules Lungs: diminished breath sounds bilaterally and rales bibasilar and bilaterally Heart: irregularly irregular rhythm, S1, S2 normal and systolic murmur: late systolic 3/6, honking and musical at 2nd right intercostal space Abdomen: soft, non-tender; bowel sounds normal; no masses,  no organomegaly Extremities: edema Trace edema of the RLE, LLE BKA Pulses: 1+ pulses Skin: pale, warm, dry Neurologic: Mental status: Alert, oriented, thought content appropriate Wound on R tibial area dressed  Inpatient Medications    Scheduled Meds: . sodium chloride   Intravenous Once  . sodium chloride   Intravenous Once  . clopidogrel  75 mg Oral Daily  . darbepoetin (ARANESP) injection - NON-DIALYSIS  200 mcg Subcutaneous Q Wed-1800  .  dextromethorphan-guaiFENesin  1 tablet Oral BID  . diltiazem  30 mg Oral Q6H  . furosemide  60 mg Intravenous Q8H  . gabapentin  100 mg Oral QHS  . insulin aspart  0-9 Units Subcutaneous TID WC  . metoprolol tartrate  12.5 mg Oral BID  . potassium chloride  40 mEq Oral Q4H  . senna-docusate  1 tablet Oral QHS  . sodium chloride flush  3 mL Intravenous Q12H  . Warfarin - Pharmacist Dosing Inpatient   Does not apply q1800    Continuous Infusions: . sodium chloride      PRN Meds: sodium chloride, acetaminophen, ondansetron (ZOFRAN) IV, sodium chloride flush   Labs   Results for orders placed or performed during the hospital encounter of 01/03/18 (from the past 48 hour(s))  CBC WITH DIFFERENTIAL     Status: Abnormal   Collection Time: 01/03/18  1:03 PM  Result Value Ref Range   WBC 12.5 (H) 4.0 - 10.5 K/uL   RBC 2.13 (L) 4.22 - 5.81 MIL/uL   Hemoglobin 7.0 (L) 13.0 - 17.0 g/dL   HCT 21.9 (L) 39.0 - 52.0 %   MCV 102.8 (H) 80.0 - 100.0 fL   MCH 32.9 26.0 - 34.0 pg   MCHC 32.0 30.0 - 36.0 g/dL   RDW 18.2 (H) 11.5 - 15.5 %   Platelets 282 150 - 400 K/uL   nRBC 0.2 0.0 - 0.2 %  Neutrophils Relative % 85 %   Neutro Abs 10.6 (H) 1.7 - 7.7 K/uL   Lymphocytes Relative 8 %   Lymphs Abs 1.0 0.7 - 4.0 K/uL   Monocytes Relative 6 %   Monocytes Absolute 0.8 0.1 - 1.0 K/uL   Eosinophils Relative 0 %   Eosinophils Absolute 0.1 0.0 - 0.5 K/uL   Basophils Relative 0 %   Basophils Absolute 0.0 0.0 - 0.1 K/uL   Immature Granulocytes 1 %   Abs Immature Granulocytes 0.09 (H) 0.00 - 0.07 K/uL    Comment: Performed at [facility] [address] [place], [place] [address]  Comprehensive metabolic panel     Status: Abnormal   Collection Time: 01/03/18  1:03 PM  Result Value Ref Range   Sodium 137 135 - 145 mmol/L   Potassium 3.9 3.5 - 5.1 mmol/L   Chloride 99 98 - 111 mmol/L   CO2 25 22 - 32 mmol/L   Glucose, Bld 129 (H) 70 - 99 mg/dL   BUN 64 (H) 8 - 23 mg/dL   Creatinine, Ser  4.07 (H) 0.61 - 1.24 mg/dL   Calcium 8.9 8.9 - 10.3 mg/dL   Total Protein 6.7 6.5 - 8.1 g/dL   Albumin 2.9 (L) 3.5 - 5.0 g/dL   AST 19 15 - 41 U/L   ALT 14 0 - 44 U/L   Alkaline Phosphatase 50 38 - 126 U/L   Total Bilirubin 0.8 0.3 - 1.2 mg/dL   GFR calc non Af Amer 12 (L) >60 mL/min   GFR calc Af Amer 14 (L) >60 mL/min   Anion gap 13 5 - 15    Comment: Performed at [facility], [address], [place], [place] [address]  Brain natriuretic peptide     Status: Abnormal   Collection Time: 01/03/18  1:03 PM  Result Value Ref Range   B Natriuretic Peptide 1,394.2 (H) 0.0 - 100.0 pg/mL    Comment: Performed at [facility] [address], [place], [place] [address]  Urinalysis, Routine w reflex microscopic     Status: Abnormal   Collection Time: 01/03/18  4:03 PM  Result Value Ref Range   Color, Urine YELLOW YELLOW   APPearance CLEAR CLEAR   Specific Gravity, Urine 1.011 1.005 - 1.030   pH 6.0 5.0 - 8.0   Glucose, UA NEGATIVE NEGATIVE mg/dL   Hgb urine dipstick MODERATE (A) NEGATIVE   Bilirubin Urine NEGATIVE NEGATIVE   Ketones, ur NEGATIVE NEGATIVE mg/dL   Protein, ur NEGATIVE NEGATIVE mg/dL   Nitrite NEGATIVE NEGATIVE   Leukocytes, UA TRACE (A) NEGATIVE   RBC / HPF >50 (H) 0 - 5 RBC/hpf   WBC, UA 11-20 0 - 5 WBC/hpf   Bacteria, UA NONE SEEN NONE SEEN   Squamous Epithelial / LPF 0-5 0 - 5   Mucus PRESENT    Hyaline Casts, UA PRESENT     Comment: Performed at [facility] [address] [place], [place] [address]  Sodium, urine, random     Status: None   Collection Time: 01/03/18  4:03 PM  Result Value Ref Range   Sodium, Ur 70 mmol/L    Comment: Performed at [facility] [address] [place], [place] [address]  Prepare RBC     Status: None   Collection Time: 01/03/18  4:31 PM  Result Value Ref Range   Order Confirmation      ORDER PROCESSED BY BLOOD BANK Performed  at [facility], [place] [address] [place], [place] [address]   Type and  screen [place]     Status: None   Collection Time: 01/03/18  4:40 PM  Result Value Ref Range   ABO/RH(D) A NEG    Antibody Screen NEG    Sample Expiration 01/06/2018    Unit Number [id number]    Blood Component Type RED CELLS,LR    Unit division 00    Status of Unit ISSUED,FINAL    Transfusion Status OK TO TRANSFUSE    Crossmatch Result      Compatible Performed at [facility], [place] [address] [place], [place] [address]    Unit Number [id number]    Blood Component Type RBC LR PHER1    Unit division 00    Status of Unit ISSUED,FINAL    Transfusion Status OK TO TRANSFUSE    Crossmatch Result Compatible   Iron and TIBC     Status: Abnormal   Collection Time: 01/03/18  4:57 PM  Result Value Ref Range   Iron 80 45 - 182 ug/dL   TIBC 231 (L) 250 - 450 ug/dL   Saturation Ratios 35 17.9 - 39.5 %   UIBC 151 ug/dL    Comment: Performed at [facility], [place] [address], [place], [place] [address]  Glucose, capillary     Status: Abnormal   Collection Time: 01/03/18  5:05 PM  Result Value Ref Range   Glucose-Capillary 124 (H) 70 - 99 mg/dL  Glucose, capillary     Status: Abnormal   Collection Time: 01/03/18  9:59 PM  Result Value Ref Range   Glucose-Capillary 116 (H) 70 - 99 mg/dL  Basic metabolic panel     Status: Abnormal   Collection Time: 01/04/18  4:02 AM  Result Value Ref Range   Sodium 140 135 - 145 mmol/L   Potassium 3.6 3.5 - 5.1 mmol/L   Chloride 100 98 - 111 mmol/L   CO2 26 22 - 32 mmol/L   Glucose, Bld 119 (H) 70 - 99 mg/dL   BUN 64 (H) 8 - 23 mg/dL   Creatinine, Ser 3.95 (H) 0.61 - 1.24 mg/dL   Calcium 8.5 (L) 8.9 - 10.3 mg/dL   GFR calc non Af Amer 13 (L) >60 mL/min   GFR calc Af Amer 15 (L) >60 mL/min   Anion gap 14 5 - 15    Comment: Performed at [facility] [address], [place], [place] [address]  Protime-INR     Status: Abnormal   Collection Time: 01/04/18  4:02 AM  Result Value Ref Range   Prothrombin Time  22.2 (H) 11.4 - 15.2 seconds   INR 1.97     Comment: Performed at [facility] [address] [place], [place] [address]  Glucose, capillary     Status: Abnormal   Collection Time: 01/04/18  7:34 AM  Result Value Ref Range   Glucose-Capillary 110 (H) 70 - 99 mg/dL  Glucose, capillary     Status: Abnormal   Collection Time: 01/04/18 11:27 AM  Result Value Ref Range   Glucose-Capillary 124 (H) 70 - 99 mg/dL  Hemoglobin and hematocrit, blood     Status: Abnormal   Collection Time: 01/04/18  2:51 PM  Result Value Ref Range   Hemoglobin 7.0 (L) 13.0 - 17.0 g/dL   HCT 20.8 (L) 39.0 - 52.0 %    Comment: Performed at [facility], [address] [place], [place] [address]  Glucose, capillary     Status: Abnormal   Collection Time: 01/04/18  4:09 PM  Result Value Ref Range   Glucose-Capillary 129 (H) 70 - 99 mg/dL  Prepare RBC     Status: None   Collection Time: 01/04/18  6:00 PM  Result Value Ref Range   Order Confirmation      ORDER PROCESSED BY BLOOD BANK Performed at [facility], [place] [address] [place], [place] [address]   Glucose, capillary     Status: Abnormal   Collection Time: 01/04/18  9:26 PM  Result Value Ref Range   Glucose-Capillary 114 (H) 70 - 99 mg/dL  Occult blood card to lab, stool RN will collect     Status: Abnormal   Collection Time: 01/05/18 12:11 AM  Result Value Ref Range   Fecal Occult Bld POSITIVE (A) NEGATIVE    Comment: Performed at [facility], [place] [address], [place], [place] [address]  Basic metabolic panel     Status: Abnormal   Collection Time: 01/05/18  3:26 AM  Result Value Ref Range   Sodium 136 135 - 145 mmol/L   Potassium 3.1 (L) 3.5 - 5.1 mmol/L   Chloride 99 98 - 111 mmol/L   CO2 24 22 - 32 mmol/L   Glucose, Bld 112 (H) 70 - 99 mg/dL   BUN 72 (H) 8 - 23 mg/dL   Creatinine, Ser 3.79 (H) 0.61 - 1.24 mg/dL   Calcium 8.5 (L) 8.9 - 10.3 mg/dL   GFR calc non Af Amer 14 (L) >60 mL/min   GFR calc Af Amer 16 (L) >60  mL/min   Anion gap 13 5 - 15    Comment: Performed at [facility] [address] [place], [place] [address]  Protime-INR     Status: Abnormal   Collection Time: 01/05/18  3:26 AM  Result Value Ref Range   Prothrombin Time 21.5 (H) 11.4 - 15.2 seconds   INR 1.89     Comment: Performed at [facility] [address] [place], [place] [address]  CBC     Status: Abnormal   Collection Time: 01/05/18  3:26 AM  Result Value Ref Range   WBC 9.1 4.0 - 10.5 K/uL   RBC 2.47 (L) 4.22 - 5.81 MIL/uL   Hemoglobin 8.7 (L) 13.0 - 17.0 g/dL   HCT 24.7 (L) 39.0 - 52.0 %   MCV 100.0 80.0 - 100.0 fL   MCH 35.2 (H) 26.0 - 34.0 pg   MCHC 35.2 30.0 - 36.0 g/dL   RDW 19.4 (H) 11.5 - 15.5 %   Platelets 247 150 - 400 K/uL   nRBC 1.0 (H) 0.0 - 0.2 %    Comment: Performed at [facility], [place] [address] [place], [place] [address]    ECG   N/A  Telemetry   Atrial fib, RVR most of the time, PVCs and pairs - Personally Reviewed  Radiology    Dg Chest 2 View  Result Date: 01/04/2018 CLINICAL DATA:  Chest congestion cough and sob x5 weeks EXAM: CHEST - 2 VIEW COMPARISON:  12/24/2017 FINDINGS: Mild hyperinflation on the lateral view. A mid to lower thoracic vertebral compression deformity is moderate. Midline trachea. Cardiomegaly accentuated by AP portable technique. Atherosclerosis in the transverse aorta. Small left pleural effusion, decreased. Resolved interstitial edema. Improved left greater than right base airspace disease. IMPRESSION: 1. Cardiomegaly with resolution of interstitial edema. 2. Small  left pleural effusion, decreased. 3. Improved bibasilar airspace disease, likely atelectasis. At the left lung base, residual infection/aspiration cannot be excluded. Electronically Signed   By: Abigail Miyamoto M.D.   On: 01/04/2018 07:19   US Renal  Result Date: 01/03/2018 CLINICAL DATA:  Chronic renal insufficiency. Congestive heart failure and shortness of breath. EXAM: RENAL /  URINARY TRACT ULTRASOUND COMPLETE COMPARISON:  CT of the chest, abdomen and pelvis performed 11/29/2017 FINDINGS: Right Kidney: Renal measurements: 9.5 x 5.9 x 5.3 cm = volume: 160.4 mL. Increased parenchymal echogenicity is noted. No mass or hydronephrosis visualized. Left Kidney: Renal measurements: 11.2 x 5.6 x 5.6 cm = volume: 182.6 mL. Increased parenchymal echogenicity is noted. No mass or hydronephrosis visualized. Bladder: Decompressed, with a Foley catheter in place. Small bilateral pleural effusions are noted. IMPRESSION: 1. No evidence of hydronephrosis. 2. Increased renal parenchymal echogenicity likely reflects medical renal disease. 3. Small bilateral pleural effusions noted. Electronically Signed   By: Garald Balding M.D.   On: 01/03/2018 21:38    Cardiac Studies   LIMITED ECHO: ORDERED  Assessment   Principal Problem:   Acute on chronic systolic heart failure (HCC) Active Problems:   CKD (chronic kidney disease)   PVD (peripheral vascular disease) with claudication (HCC)   CAD (coronary artery disease)   Aortic valve stenosis   Diabetes mellitus ([place])   Diabetic peripheral vascular disease (HCC)   Anemia   Pressure injury of skin   Plan   1. Anemia: FOB +. Mr Bair feels better after transfusion, H&H are improved. Source of blood loss may be GI, although daughter states nothing was found on recent eval, will try to get records, leave eval to IM. 2. Afib, RVR: Dilt 30 mg q 6 hr added back 12/25 for rate control, and was started back on Lopressor at 12. 5 mg bid last pm. Transfusion may have helped also.  3. AS: May be more severe than previously thought. Limited echo ordered to further assess valve, need HR control. 4. Acute on chronic combined systolic and diastolic CHF: I/O net neg 3 L since admit, wt down 14 lbs, I/O is likely incomplete, however wt is still down, lung sounds are poor despite volume improvement, recheck CXR 5. Chronic anticoag: INR up to 1.89, per  Pharmacy 6. CKD IV/V: Renal following, Cr improved from admission but still very high 7. Hypokalemia: 2nd diuresis, 40 meq ordered already, per IM/Nephrology   Rosaria Ferries 01/05/2018, 7:40 AM

## 2018-01-05 NOTE — Consult Note (Addendum)
[facility] Nurse wound consult note Reason for Consult: Consult requested for right heel and leg.  Pt states he is followed by the outpatient wound care center in [facility] for the chronic right heel wound and they have ordered daily Hydrogel and he receives serial debridements.  Wound type: Right heel with stage 3 pressure injury; 1.5X1X.2cm, 80% red, 20% yellow Pressure Injury POA: Yes Drainage (amount, consistency, odor) small amt tan drainage, no odor or fluctuance Right anterior calf with patchy areas of partial thickness skin loss where previous blister ruptured.  2X1X.1cm, red and moist with small amt yellow drainage. Dressing procedure/placement/frequency: Continue present plan of care with Hydrogel daily to right heel, patient has his own at the bedside.  Foam dressing to protect and promote healing to right heel and leg.  Float heel to reduce pressure.  Pt plans to resume follow-up with the outpatient wound care center after discharge. Please re-consult if further assistance is needed.  Thank-you,  Julien Girt MSN, [place], [place], [place], [place]

## 2018-01-05 NOTE — Progress Notes (Signed)
Was notified by nursing staff that patient's Hr's have sustained in the 140-150's despite administering his evening dose of the metoprolol. His BP was also in the high 09'Q systolic. Given he has CKD, rate control may only be accomplished at this point with amiodarone. Patient at a slight risk of medical cardioversion with a transient subtherapeutic INR, but our options for rate control are limited. If patient continues to sustain this high it may worsen his overall clinical picture. Will discuss this with the patient as well.   (Cardiology fellow, Willeen Cass)

## 2018-01-05 NOTE — Plan of Care (Signed)
  Problem: Education: Goal: Knowledge of General Education information will improve Description: Including pain rating scale, medication(s)/side effects and non-pharmacologic comfort measures Outcome: Progressing   Problem: Clinical Measurements: Goal: Respiratory complications will improve Outcome: Progressing Goal: Cardiovascular complication will be avoided Outcome: Progressing   

## 2018-01-05 NOTE — Progress Notes (Signed)
Greg Booker ROUNDING NOTE   Subjective:   Caregiver in room.  [name] appears in good spirits.  Blood pressure 102/40 pulse of 102 temperature 98.4 O2 sats 99% room air  Sodium 136 potassium 3.1 chloride 99 CO2 24 BUN 72 creatinine 3.79 glucose 112 calcium 8.5 WBC 9.1 hemoglobin 8.7 platelets 247 INR 1.89  Recorded urine output 3 L recorded 01/04/2018  Objective:  Vital signs in last 24 hours:  Temp:  [98.2 F (36.8 C)-98.7 F (37.1 C)] 98.5 F (36.9 C) (12/26 0809) Pulse Rate:  [66-127] 102 (12/26 0809) Resp:  [16-20] 18 (12/26 0809) BP: (102-128)/(40-100) 102/40 (12/26 0809) SpO2:  [98 %-99 %] 99 % (12/26 0809) Weight:  [71.3 kg] 71.3 kg (12/26 0603)  Weight change: -6.295 kg Filed Weights   01/03/18 1259 01/04/18 0514 01/05/18 0603  Weight: 77.6 kg 74.6 kg 71.3 kg    Intake/Output: I/O last 3 completed shifts: In: 862.7 [P.O.:240; Blood:622.7] Out: 4287 [Urine:3950]   Intake/Output this shift:  No intake/output data recorded. Alert elderly nondistressed appears fatigued and exhausted CVS-irregular rate and rhythm tachycardic 3/6 systolic murmur RS-some rhonchorous breath sounds diminished at bases ABD- BS present soft non-distended EXT-2+ pitting edema   Basic Metabolic Panel: Recent Labs  Lab 01/03/18 1303 01/04/18 0402 01/05/18 0326  NA 137 140 136  K 3.9 3.6 3.1*  CL 99 100 99  CO2 25 26 24   GLUCOSE 129* 119* 112*  BUN 64* 64* 72*  CREATININE 4.07* 3.95* 3.79*  CALCIUM 8.9 8.5* 8.5*    Liver Function Tests: Recent Labs  Lab 01/03/18 1303  AST 19  ALT 14  ALKPHOS 50  BILITOT 0.8  PROT 6.7  ALBUMIN 2.9*   No results for input(s): LIPASE, AMYLASE in the last 168 hours. No results for input(s): AMMONIA in the last 168 hours.  CBC: Recent Labs  Lab 01/03/18 1303 01/04/18 1451 01/05/18 0326  WBC 12.5*  --  9.1  NEUTROABS 10.6*  --   --   HGB 7.0* 7.0* 8.7*  HCT 21.9* 20.8* 24.7*  MCV 102.8*  --  100.0  PLT 282  --   247    Cardiac Enzymes: No results for input(s): CKTOTAL, CKMB, CKMBINDEX, TROPONINI in the last 168 hours.  BNP: Invalid input(s): POCBNP  CBG: Recent Labs  Lab 01/03/18 2159 01/04/18 0734 01/04/18 1127 01/04/18 1609 01/04/18 2126  GLUCAP 116* 110* 44* 129* 114*    Microbiology: Results for orders placed or performed during the hospital encounter of 06/18/13  Surgical pcr screen     Status: None   Collection Time: 06/18/13  9:38 AM  Result Value Ref Range Status   MRSA, PCR NEGATIVE NEGATIVE Final   Staphylococcus aureus NEGATIVE NEGATIVE Final    Comment:        The Xpert SA Assay (FDA approved for NASAL specimens in patients over 49 years of age), is one component of a comprehensive surveillance program.  Test performance has been validated by [employer] for patients greater than or equal to 29 year old. It is not intended to diagnose infection nor to guide or monitor treatment.    Coagulation Studies: Recent Labs    01/03/18 1041 01/04/18 0402 01/05/18 0326  LABPROT  --  22.2* 21.5*  INR 2.8 1.97 1.89    Urinalysis: Recent Labs    01/03/18 1603  COLORURINE YELLOW  LABSPEC 1.011  PHURINE 6.0  GLUCOSEU NEGATIVE  HGBUR MODERATE*  BILIRUBINUR NEGATIVE  KETONESUR NEGATIVE  PROTEINUR NEGATIVE  NITRITE NEGATIVE  LEUKOCYTESUR TRACE*      Imaging: Dg Chest 2 View  Result Date: 01/04/2018 CLINICAL DATA:  Chest congestion cough and sob x5 weeks EXAM: CHEST - 2 VIEW COMPARISON:  12/24/2017 FINDINGS: Mild hyperinflation on the lateral view. A mid to lower thoracic vertebral compression deformity is moderate. Midline trachea. Cardiomegaly accentuated by AP portable technique. Atherosclerosis in the transverse aorta. Small left pleural effusion, decreased. Resolved interstitial edema. Improved left greater than right base airspace disease. IMPRESSION: 1. Cardiomegaly with resolution of interstitial edema. 2. Small left pleural effusion, decreased. 3.  Improved bibasilar airspace disease, likely atelectasis. At the left lung base, residual infection/aspiration cannot be excluded. Electronically Signed   By: Greg Miyamoto M.D.   On: 01/04/2018 07:19   US Renal  Result Date: 01/03/2018 CLINICAL DATA:  Chronic renal insufficiency. Congestive heart failure and shortness of breath. EXAM: RENAL / URINARY TRACT ULTRASOUND COMPLETE COMPARISON:  CT of the chest, abdomen and pelvis performed 11/29/2017 FINDINGS: Right Kidney: Renal measurements: 9.5 x 5.9 x 5.3 cm = volume: 160.4 mL. Increased parenchymal echogenicity is noted. No mass or hydronephrosis visualized. Left Kidney: Renal measurements: 11.2 x 5.6 x 5.6 cm = volume: 182.6 mL. Increased parenchymal echogenicity is noted. No mass or hydronephrosis visualized. Bladder: Decompressed, with a Foley catheter in place. Small bilateral pleural effusions are noted. IMPRESSION: 1. No evidence of hydronephrosis. 2. Increased renal parenchymal echogenicity likely reflects medical renal disease. 3. Small bilateral pleural effusions noted. Electronically Signed   By: Garald Balding M.D.   On: 01/03/2018 21:38     Medications:   . sodium chloride     . sodium chloride   Intravenous Once  . sodium chloride   Intravenous Once  . clopidogrel  75 mg Oral Daily  . darbepoetin (ARANESP) injection - NON-DIALYSIS  200 mcg Subcutaneous Q Wed-1800  . dextromethorphan-guaiFENesin  1 tablet Oral BID  . diltiazem  30 mg Oral Q6H  . furosemide  60 mg Intravenous Q8H  . gabapentin  100 mg Oral QHS  . insulin aspart  0-9 Units Subcutaneous TID WC  . metoprolol tartrate  12.5 mg Oral BID  . potassium chloride  40 mEq Oral Q4H  . senna-docusate  1 tablet Oral QHS  . sodium chloride flush  3 mL Intravenous Q12H  . Warfarin - Pharmacist Dosing Inpatient   Does not apply q1800   sodium chloride, acetaminophen, ondansetron (ZOFRAN) IV, sodium chloride flush  Assessment/ Plan:   Acute on chronic renal failure with some  progression of his renal insufficiency his last creatinine was 2.7 19 December 2017 on admission is increased to 4.07 is slightly better this morning.  Creatinine appears to be improving.  He had significant heart failure and hypotension I believe this is the etiology of his worsening renal failure.  His urinalysis showed moderate blood greater than 50 RBCs per high-powered film negative for protein specific gravity 1.011 he had 11-20 WBCs per high-powered field nitrates negative leukocyte esterase trace.  Renal ultrasound no evidence of hydronephrosis increased renal parenchymal echogenicity and bilateral pleural effusions.  The urine is bloody and this could be secondary to Foley trauma, patient also on anticoagulation with Coumadin.  I do not think this represents an acute glomerulonephritis.  He may have a urinary tract infection and was sent urine for culture the results are pending.  There may be an element of interstitial nephritis as he does have some white cells in his urine.  I think at this point this would be academic.  Atrial fibrillation improves to be under better control.  Appreciate help from cardiology  Hypertension/volume.  Patient's blood pressure has improved with discontinuation of his antihypertensives he is now back on diltiazem and metoprolol as well as Lasix.  We shall see how he tolerates this.  I do not think he would tolerate dialysis even though he has an AV fistula that is mature we have discontinued some of his negative inotropic medications continue to avoid nonsteroidal anti-inflammatory drugs  Chronic kidney disease stage IV/V continue to monitor blood pressure problematic and I do not think he would tolerate dialysis  Anemia iron saturations started darbepoetin.  Transfused 1 unit packed red blood cells 01/03/2018   Bones PTH pending   atrial fibrillation patient anticoagulated.  This could be causing hematuria with Foley.  History of prostate cancer status post  radioactive seed placement.  Hypokalemia replete appreciate assistance from Dr. Ree Kida  Will sign off on patient today.  Diuresis appears to be continuing well creatinine appears to be stable appreciate consult and will be happy to see the patient at any time he already has a primary nephrologist involved in his care.  This is in [facility] and he can follow-up with his regular nephrologist on discharge   LOS: [place] @TODAY @8 :12 AM

## 2018-01-05 NOTE — Clinical Social Work Note (Signed)
Clinical Social Work Assessment  Patient Details  Name: Greg Booker MRN: [id number] Date of Birth: 07-May-1929  Date of referral:  01/05/18               Reason for consult:  Facility Placement, Discharge Planning                Permission sought to share information with:  Family Supports Permission granted to share information::  Yes, Verbal Permission Granted  Name::     Edythe Clarity  Agency::     Relationship::  caregiver  Contact Information:  [phone]  Housing/Transportation Living arrangements for the past 2 months:  [place], [place] of Information:  Patient, Other (Comment Required)(caregiver) Patient Interpreter Needed:  None Criminal Activity/Legal Involvement Pertinent to Current Situation/Hospitalization:  No - Comment as needed Significant Relationships:  Siblings, Other(Comment)(caregiver) Lives with:  Self Do you feel safe going back to the place where you live?  Yes Need for family participation in patient care:  Yes (Comment)  Care giving concerns: Patient from rehab at [facility] in [place]. Prior to rehab was home with a caregiver. Patient is amputated on the left leg and uses a wheelchair at baseline.   Social Worker assessment / plan: CSW met with patient at bedside. Patient alert and oriented. CSW introduced self and role and discussed disposition planning. Patient indicated he had been at [facility] for rehab but now wants to go home.   Patient reported he lives at home alone and has a caregiver that comes to help him during the day. Patient called his caregiver, [name], and had her on speaker phone for part of the assessment. [name] indicated she stays with patient all day, and she can arrange for other people to stay with him at night as well. [name] is agreeable for patient to go home with home health services.  Requested PT evaluation from MD and referred patient to [facility] for home health needs. CSW will sign off, as no  additional needs identified at this time. Please re-consult if other needs arise or if disposition plan changes to SNF.  Employment status:  Retired Research officer, political party) PT Recommendations:  Not assessed at this time Information / Referral to community resources:     Patient/Family's Response to care: Patient and caregiver appreciative of care.  Patient/Family's Understanding of and Emotional Response to Diagnosis, Current Treatment, and Prognosis: Patient and caregiver with understanding of patient's condition. Patient and caregiver both want for patient to return home at discharge.  Emotional Assessment Appearance:  Appears stated age Attitude/Demeanor/Rapport:  Engaged Affect (typically observed):  Accepting, Calm, Pleasant, Appropriate Orientation:  Oriented to Self, Oriented to Place, Oriented to  Time, Oriented to Situation Alcohol / Substance use:  Not Applicable Psych involvement (Current and /or in the community):  No (Comment)  Discharge Needs  Concerns to be addressed:  Discharge Planning Concerns, Care Coordination Readmission within the last 30 days:  Yes Current discharge risk:  Physical Impairment, Dependent with Mobility Barriers to Discharge:  Continued Medical Work up   Estanislado Emms, LCSW 01/05/2018, 4:41 PM

## 2018-01-05 NOTE — Progress Notes (Signed)
  Echocardiogram 2D Echocardiogram Limited has been performed.  Greg Booker 01/05/2018, 11:07 AM

## 2018-01-06 DIAGNOSIS — Z7189 Other specified counseling: Secondary | ICD-10-CM

## 2018-01-06 DIAGNOSIS — Z515 Encounter for palliative care: Secondary | ICD-10-CM

## 2018-01-06 DIAGNOSIS — N179 Acute kidney failure, unspecified: Secondary | ICD-10-CM

## 2018-01-06 DIAGNOSIS — D508 Other iron deficiency anemias: Secondary | ICD-10-CM

## 2018-01-06 DIAGNOSIS — N170 Acute kidney failure with tubular necrosis: Secondary | ICD-10-CM

## 2018-01-06 DIAGNOSIS — N184 Chronic kidney disease, stage 4 (severe): Secondary | ICD-10-CM

## 2018-01-06 LAB — HEMOGLOBIN AND HEMATOCRIT, BLOOD
HCT: 21.5 % — ABNORMAL LOW (ref 39.0–52.0)
Hemoglobin: 7.4 g/dL — ABNORMAL LOW (ref 13.0–17.0)

## 2018-01-06 LAB — URINE CULTURE
Culture: NO GROWTH
Special Requests: NORMAL

## 2018-01-06 LAB — BASIC METABOLIC PANEL
Anion gap: 16 — ABNORMAL HIGH (ref 5–15)
BUN: 83 mg/dL — ABNORMAL HIGH (ref 8–23)
CALCIUM: 8.7 mg/dL — AB (ref 8.9–10.3)
CO2: 22 mmol/L (ref 22–32)
Chloride: 103 mmol/L (ref 98–111)
Creatinine, Ser: 3.67 mg/dL — ABNORMAL HIGH (ref 0.61–1.24)
GFR calc Af Amer: 16 mL/min — ABNORMAL LOW (ref >60)
GFR calc non Af Amer: 14 mL/min — ABNORMAL LOW (ref >60)
Glucose, Bld: 147 mg/dL — ABNORMAL HIGH (ref 70–99)
Potassium: 4.5 mmol/L (ref 3.5–5.1)
Sodium: 141 mmol/L (ref 135–145)

## 2018-01-06 LAB — ECHOCARDIOGRAM LIMITED
Height: 69 in
Weight: 2515.2 oz

## 2018-01-06 LAB — PROTIME-INR
INR: 1.9
Prothrombin Time: 21.6 seconds — ABNORMAL HIGH (ref 11.4–15.2)

## 2018-01-06 LAB — GLUCOSE, CAPILLARY
Glucose-Capillary: 135 mg/dL — ABNORMAL HIGH (ref 70–99)
Glucose-Capillary: 122 mg/dL — ABNORMAL HIGH (ref 70–99)
Glucose-Capillary: 134 mg/dL — ABNORMAL HIGH (ref 70–99)
Glucose-Capillary: 124 mg/dL — ABNORMAL HIGH (ref 70–99)

## 2018-01-06 MED ORDER — WARFARIN SODIUM 2.5 MG PO TABS
2.5000 mg | ORAL_TABLET | Freq: Once | ORAL | Status: AC
  Administered 2018-01-06: 2.5 mg via ORAL
  Filled 2018-01-06: qty 1

## 2018-01-06 NOTE — Care Management Important Message (Signed)
Important Message  Patient Details  Name: Greg Booker MRN: [id number] Date of Birth: Jul 28, 1929   Medicare Important Message Given:  Yes    Emani Morad P Ryin Ambrosius 01/06/2018, 4:53 PM

## 2018-01-06 NOTE — Evaluation (Signed)
Physical Therapy Evaluation Patient Details Name: Greg Booker MRN: [id number] DOB: 11/17/1929 Today's Date: 01/06/2018   History of Present Illness  Pt is an 84 y.o. male admitted from [facility] on 01/03/18 with fluid overload and dyspnea; worked up for CHF and AKI on CKD IV. Went into a-fib on 12/26 requiring metoprolol. PMH includes LLE amputation (03/2017), CAD (s/p DES to RCA 12/12/17), PAD, HTN, hearing impaired, DM.    Clinical Impression  Pt presents with an overall decrease in functional mobility secondary to above. PTA, pt receiving rehab at [facility]. Today, pt able to perform bed mobility and OOB transfer with modA; caregiver, [name], present and able to provide appropriate assist. Pt plans to return home with 24/7 support from caregivers; owns all necessary DME. Pt would benefit from continued acute PT services to maximize functional mobility and independence prior to d/c with HHPT services.     Follow Up Recommendations Home health PT;Supervision/Assistance - 24 hour    Equipment Recommendations  None recommended by PT    Recommendations for Other Services       Precautions / Restrictions Precautions Precautions: Fall Restrictions Weight Bearing Restrictions: No      Mobility  Bed Mobility Overal bed mobility: Needs Assistance Bed Mobility: Supine to Sit     Supine to sit: Mod assist;HOB elevated     General bed mobility comments: Caregiver provided modA for trnk elevation; encouraged pt to assist as much as possible as he could have likely assisted more, only requiring min guard  Transfers Overall transfer level: Needs assistance Equipment used: 1 person hand held assist Transfers: Squat Pivot Transfers           General transfer comment: Caregiver provided modA for squat pivot to recliner towards R-side; encouraged pt to assist more as able  Ambulation/Gait             General Gait Details: Unable  Stairs             Wheelchair Mobility    Modified Rankin (Stroke Patients Only)       Balance                                             Pertinent Vitals/Pain Pain Assessment: Faces Faces Pain Scale: Hurts a little bit Pain Location: L residual limb Pain Descriptors / Indicators: Sore Pain Intervention(s): Limited activity within patient's tolerance    Home Living Family/patient expects to be discharged to:: Private residence Living Arrangements: Alone Available Help at Discharge: Personal care attendant;Available 24 hours/day Type of Home: House Home Access: Ramped entrance     Home Layout: One level Home Equipment: Walker - 2 wheels;Bedside commode;Shower seat;Wheelchair - manual;Other (comment)(Lift chair; bed that is basically a hospital bed) Additional Comments: Will have 24/7 support from caregivers    Prior Function Level of Independence: Needs assistance   Gait / Transfers Assistance Needed: Assist from caregivers for all pivot transfers on RLE  ADL's / Homemaking Assistance Needed: Assist for all ADLs. Gets to shower chair; intermittent use of BSC pending urgency  Comments: Was ambulatory with RW and LLE prosthetic, working with PT earlier this year; has not been able to with worsening medical status     Hand Dominance        Extremity/Trunk Assessment   Upper Extremity Assessment Upper Extremity Assessment: Generalized weakness    Lower Extremity  Assessment Lower Extremity Assessment: Generalized weakness       Communication   Communication: HOH  Cognition Arousal/Alertness: Awake/alert Behavior During Therapy: WFL for tasks assessed/performed Overall Cognitive Status: Within Functional Limits for tasks assessed                                 General Comments: WFL for simple tasks      General Comments      Exercises     Assessment/Plan    PT Assessment Patient needs continued PT services  PT Problem List  Decreased strength;Decreased activity tolerance;Decreased balance;Decreased mobility       PT Treatment Interventions DME instruction;Functional mobility training;Therapeutic activities;Therapeutic exercise;Balance training;Patient/family education;Wheelchair mobility training    PT Goals (Current goals can be found in the Care Plan section)  Acute Rehab PT Goals Patient Stated Goal: Return home with caregiver support PT Goal Formulation: With patient Time For Goal Achievement: 01/20/18 Potential to Achieve Goals: Good    Frequency Min 3X/week   Barriers to discharge        Co-evaluation               AM-PAC PT "6 Clicks" Mobility  Outcome Measure Help needed turning from your back to your side while in a flat bed without using bedrails?: A Little Help needed moving from lying on your back to sitting on the side of a flat bed without using bedrails?: A Lot Help needed moving to and from a bed to a chair (including a wheelchair)?: A Lot Help needed standing up from a chair using your arms (e.g., wheelchair or bedside chair)?: A Lot Help needed to walk in hospital room?: Total Help needed climbing 3-5 steps with a railing? : Total 6 Click Score: 11    End of Session   Activity Tolerance: Patient tolerated treatment well Patient left: in chair;with call bell/phone within reach;with chair alarm set;with family/visitor present Nurse Communication: Mobility status PT Visit Diagnosis: Other abnormalities of gait and mobility (R26.89);Muscle weakness (generalized) (M62.81)    Time: 2536-6440 PT Time Calculation (min) (ACUTE ONLY): 21 min   Charges:   PT Evaluation $PT Eval Moderate Complexity: 1 Mod        Mabeline Caras, PT, DPT Acute Rehabilitation Services  Pager [phone] Office [phone]  Derry Lory 01/06/2018, 8:43 AM

## 2018-01-06 NOTE — Progress Notes (Signed)
ANTICOAGULATION CONSULT NOTE - Follow Up Consult  Pharmacy Consult for Coumadin Indication: atrial fibrillation  Allergies  Allergen Reactions  . Bicalutamide Other (See Comments)    Casodex caused increase in breast size 01/05/18: confirmed still taking this medication  . Codeine Itching and Rash  . Niacin And Related Other (See Comments)    Burning and stinging from tablets (otc capsules ok)  . Lovastatin Other (See Comments)    Leg weakness  . Morphine Other (See Comments)    "Allergic," per MAR  . Beef Extract Rash  . Pravastatin Other (See Comments)    Myalgia    Patient Measurements: Height: 5\' 9"  (175.3 cm) Weight: 157 lb 3.2 oz (71.3 kg) IBW/kg (Calculated) : 70.7  Vital Signs: Temp: 97.8 F (36.6 C) (12/27 0752) Temp Source: Oral (12/27 0752) BP: 97/49 (12/27 0901) Pulse Rate: 94 (12/27 0901)  Labs: Recent Labs    01/04/18 0402  01/04/18 1451 01/05/18 0326 01/06/18 0445  HGB  --    < > 7.0* 8.7* 7.4*  HCT  --   --  20.8* 24.7* 21.5*  PLT  --   --   --  247  --   LABPROT 22.2*  --   --  21.5* 21.6*  INR 1.97  --   --  1.89 1.90  CREATININE 3.95*  --   --  3.79* 3.67*   < > = values in this interval not displayed.    Estimated Creatinine Clearance: 14.4 mL/min (A) (by C-G formula based on SCr of 3.67 mg/dL (H)).  Assessment:   84 yr old male admitted 12/24 with fluid overload.  To continue Coumadin as prior to admission for atrial fibrillation and hx DVT (2013?).       Caregiver  reported 12/24 most recent Coumadin regimen is 2.5 mg daily except 5 mg on Tuesdays.  She also reports that Coumadin has been held x 2 days d/t INR 4.1 on 12/22 and 3.8 on 12/23 at SNF.  She also reports INR 3.1 this morning.  INR 2.8 at Cardiology office on 12/24.  Hx anemia, Hgb down 8.1 on 12/24 and 1 unit PRBCs given, Hgb 8.7 today. Aranesp 200 mg SQ weekly begun on 12/25.  Also on Plavix daily for recent stent.    INR remains just below target range at 1.9. Had expected  some decrease, after Coumadin held 12/22 and 12/23. Coumadin 2.5 mg given the last 3 days. Hematuria 12/25, possibly from foley trauma.  Foley out 12/26 and urine reported clear. FOB positive on 12/25. PPI daily resumed on 12/26. Noted discussion of risk/benefit of Coumadin, but to continue along with Plavix.  Patient reports BM but not aware of any blood in stool.  Amiodarone added overnight, which can increase sensitivity to Coumadin.  Goal of Therapy:  INR 2-3 Monitor platelets by anticoagulation protocol: Yes   Plan:   Coumadin 2.5 mg again today.  Daily PT/INR.   Watch for sensitivity to Coumadin with addition of amiodarone.  Follow up FOB checks, monitor for any bleeding.  Arty Baumgartner, [place] Pager: [phone] or phone: [phone] 01/06/2018,4:25 PM

## 2018-01-06 NOTE — Progress Notes (Addendum)
Progress Note  Patient Name: Greg Booker Date of Encounter: 01/06/2018  Primary Cardiologist: Pixie Casino, MD  Subjective   Bedside monitor indicates BP was 79/39 this AM, with nurse recheck 97/49. Diltiazem and metoprolol were held. He was started on amiodarone overnight for rapid rate. He is currently asymptomatic fortunately. Edema is much improved and he denies CP and dyspnea. Not yet weighed today. Also had nosebleeds this AM. Denies any obvious GIB symptoms. No further hematuria. Caregiver at bedside.  Caregiver also brings in a sheet of paper from oncology outlining that his prostate cancer is metastatic with lymph nodes in belly, chest and ribs plus clavicles.  Inpatient Medications    Scheduled Meds: . B-complex with vitamin C  1 tablet Oral Daily  . bicalutamide  50 mg Oral Daily  . clopidogrel  75 mg Oral Daily  . darbepoetin (ARANESP) injection - NON-DIALYSIS  200 mcg Subcutaneous Q Wed-1800  . dextromethorphan-guaiFENesin  1 tablet Oral BID  . diltiazem  30 mg Oral Q6H  . furosemide  60 mg Intravenous Q8H  . gabapentin  100 mg Oral QHS  . insulin aspart  0-9 Units Subcutaneous TID WC  . metoprolol tartrate  25 mg Oral BID  . multivitamin with minerals  1 tablet Oral Daily  . pantoprazole  40 mg Oral QAC breakfast  . senna  2 tablet Oral QHS  . sodium chloride flush  3 mL Intravenous Q12H  . tamsulosin  0.4 mg Oral QPC supper  . Warfarin - Pharmacist Dosing Inpatient   Does not apply q1800   Continuous Infusions: . sodium chloride    . amiodarone 30 mg/hr (01/06/18 0520)   PRN Meds: sodium chloride, acetaminophen, albuterol, ALPRAZolam, alum & mag hydroxide-simeth, metoprolol tartrate, ondansetron (ZOFRAN) IV, sodium chloride flush   Vital Signs    Vitals:   01/05/18 1952 01/06/18 0526 01/06/18 0752 01/06/18 0901  BP: 98/60 (!) 103/47 (!) 131/92 (!) 97/49  Pulse: (!) 128 92 90 94  Resp: (!) 29 (!) 21 19   Temp: 99.1 F (37.3 C) 97.7 F (36.5 C)  97.8 F (36.6 C)   TempSrc: Oral Oral Oral   SpO2: 94% 97% 98%   Weight:      Height:        Intake/Output Summary (Last 24 hours) at 01/06/2018 0940 Last data filed at 01/06/2018 0700 Gross per 24 hour  Intake 423.87 ml  Output 1100 ml  Net -676.13 ml   Filed Weights   01/03/18 1259 01/04/18 0514 01/05/18 0603  Weight: 77.6 kg 74.6 kg 71.3 kg    Telemetry    Atrial fib with HR 90s-100s, with ST depressions - Personally Reviewed  Physical Exam   GEN: No acute distress. Pale HEENT: Normocephalic, atraumatic, sclera non-icteric. Neck: No JVD or bruits. Cardiac: Tachycardic, regular. No overt murmurs, rubs, or gallops.  Radials/DP/PT 1+ and equal bilaterally.  Respiratory: Diffusely diminished throughout. Breathing is unlabored. GI: Soft, nontender, non-distended, BS +x 4. MS: no deformity. Extremities: No clubbing or cyanosis. No edema RLE; s/p LLE amputation. Diffuse ecchymosis in arms and venous stasis changes in RLE. Skin tear covered R shin. Neuro:  AAOx3. Follows commands. Psych:  Responds to questions appropriately with a normal affect.  Labs    Chemistry Recent Labs  Lab 01/03/18 1303 01/04/18 0402 01/05/18 0326 01/06/18 0445  NA 137 140 136 141  K 3.9 3.6 3.1* 4.5  CL 99 100 99 103  CO2 25 26 24 22   GLUCOSE 129* 119*  112* 147*  BUN 64* 64* 72* 83*  CREATININE 4.07* 3.95* 3.79* 3.67*  CALCIUM 8.9 8.5* 8.5* 8.7*  PROT 6.7  --   --   --   ALBUMIN 2.9*  --   --   --   AST 19  --   --   --   ALT 14  --   --   --   ALKPHOS 50  --   --   --   BILITOT 0.8  --   --   --   GFRNONAA 12* 13* 14* 14*  GFRAA 14* 15* 16* 16*  ANIONGAP 13 14 13  16*     Hematology Recent Labs  Lab 01/03/18 1303 01/04/18 1451 01/05/18 0326 01/06/18 0445  WBC 12.5*  --  9.1  --   RBC 2.13*  --  2.47*  --   HGB 7.0* 7.0* 8.7* 7.4*  HCT 21.9* 20.8* 24.7* 21.5*  MCV 102.8*  --  100.0  --   MCH 32.9  --  35.2*  --   MCHC 32.0  --  35.2  --   RDW 18.2*  --  19.4*  --     PLT 282  --  247  --     Cardiac EnzymesNo results for input(s): TROPONINI in the last 168 hours. No results for input(s): TROPIPOC in the last 168 hours.   BNP Recent Labs  Lab 01/03/18 1303  BNP 1,394.2*     DDimer No results for input(s): DDIMER in the last 168 hours.   Radiology    Dg Chest 2 View  Result Date: 01/05/2018 CLINICAL DATA:  Cough. EXAM: CHEST - 2 VIEW COMPARISON:  01/04/2018 FINDINGS: Stable mild enlargement of the cardiopericardial silhouette. Increased retrocardiac airspace opacity. Atherosclerotic calcification of the aortic arch. Calcified granulomas at the right lung base. Bilateral interstitial accentuation similar to prior. Blunting of both costophrenic angles posteriorly. Bony demineralization. IMPRESSION: 1. Cardiomegaly with interstitial edema. 2. Small bilateral pleural effusions. Increased airspace opacity in the retrocardiac region potentially from increased atelectasis or pneumonia. 3. Old granulomatous disease. Electronically Signed   By: Van Clines M.D.   On: 01/05/2018 09:15    Cardiac Studies   2D echo yesterday Study Conclusions  - Left ventricle: The cavity size was normal. Systolic function was   moderately to severely reduced. The estimated ejection fraction   was in the range of 30% to 35%. Diffuse hypokinesis. Stroke   volume: 71 ml. Stroke volume/bsa: 38 ml/m^2. - Aortic valve: Valve mobility was moderately restricted. There was   trivial regurgitation. Mean gradient (S): 28 mm Hg. Valve area   (Vmax): 1 cm^2. - Mitral valve: Calcified annulus. There was trivial regurgitation. - Left atrium: The atrium was moderately dilated. - Right ventricle: Systolic function was mildly to moderately   reduced.  Impressions:  - Exam performed in atrial fibrillation with RVR. LVEF is 30-35%.   RV systolic function is mild-moderately reduced.     Aortic stenosis is likely moderate to moderate-severe. The valve   is severely  calcified, with moderately reduced leaflet mobility.   Stroke volume index is 38 mL/m2 despite reduced ejection   fraction. Mean systolic gradient through AV is 28 mmHg. Valve   area is subject to error from LVOT diameter and LVOT TVI,   calculates to approximately 1 cm2.     Gradient was interrogated from multiple windows with apical   window producing highest gradient.  Patient Profile     84 y.o. male w/ CAD (  BMS to RCA 1997, CTO Of RCA since 2006 and recent new severe LAD stenosis with NSTEMI s/p DES 12/12/17 at [facility] with EF 50-55% per cath report), permanent atrial fibrillation, remote DVT, PAD (s/p prior LE bypass and toe amputations), CKD IV-V, DM, HTN, HLD, prostate CA, cardiomyopathy (EF 45-50% in 08/2017, 50-55% by cath 12/12/17), moderate AS by echo 12/04/2017 (mod-severe by cath 12/12/17). He was recently readmitted 12/20/17 to [facility] with PNA, acute heart failure, also with decubitus heel ulcer and hematuria. He was previously on Xarelto but changed to coumadin given renal insufficiency. He was admitted from office with severe edema, weight gain, lethargy, SOB, lower extremity weeping, cough and congestion. Also noted to have progressive anemia and renal failure, possible LE cellulitis, heme positive stools. Repeat limited echo yesterday showed EF 30-35% with diffuse HK, mild-mod reduced RV function, no significant AS - test performed while in AF RVR.  Assessment & Plan    1. AKI on CKD IV-V - question AKI related to recent cath and multitude of medical problems. With hypotension and comorbidities, would be poor candidate for hemodialysis. Creatinine appears to be have fortunately plateaued. Nephrology has signed off.  2. Acute on chronic combined CHF with new drop in EF, also with RV dysfunction - volume status appears improved compared to admission descriptors. Not weighed today but down 14 lb and - 3.8L. I suspect we can draw back on diuresis now - next dose due 2pm. Given his  renal insufficiency and bleeding issues I do not anticipate any more invasive caths to assess drop in ejection fraction. Traditional CHF management (I.e. ACEI/ARB/ARNI/spiro) is unable to be used due to renal insufficiency and hypotension.  3. Aortic stenosis, severity not entirely clear to me - per cath report was moderate-severe in 12/2, and moderate by echo 12/04/17, but echo this admission was limited and did not reveal any stenosis. I will ask MD to review as mean gradient was 16mmHg and peak gradient 58mmHg.  4. Permanent atrial fibrillation with RVR this admission - suspect rapid rates driven by multitude of clinical picture. Limited options at this point. Stop diltiazem due to low EF. Metoprolol was held due to hypotension. Amiodarone does appear to be affording some HR control. Cannot use dig with renal failure.  5. Anemia, suspect multifactorial - with + FOBT, hematuria, nosebleeds.  IM note indicates in 11/2017 small bowel capsule endoscopy was deferred as hemoglobin was stable. At this point may need to consider that risks of ongoing anticoagulation outweigh the benefit. (Cannot stop Plavix though given recent DES). His BUN/Cr ratio to me suggests probable component of GI bleeding.  I am concerned about Mr. Capp's trajectory looking at the bigger picture the last few weeks. He is doing OK today but we are seeing evidence of multiorgan failure with harbingers of poor prognosis. Palliative care has seen patient his admission but remains full code. I will plan to discuss the above plan with Dr. Debara Pickett.   For questions or updates, please contact [place] Please consult [email or web address] for contact info under Cardiology/STEMI.  Signed, Charlie Pitter, PA-C 01/06/2018, 9:40 AM

## 2018-01-06 NOTE — Progress Notes (Signed)
PROGRESS NOTE    Greg Booker  ZOX:[id number] DOB: Jan 17, 1929 DOA: 01/03/2018 PCP: Raina Mina., MD   Brief Narrative:  HPI on 01/03/2018 by Ms. Jory Sims, NP (cardiology) Greg Booker is a 84 y.o. male who presents for ongoing assessment and management of CAD with most recent cath in 2006 revealed an occluded RCA with collaterals. Hx of DVT on coumadin  therapy.She is followed in our coumadin clinic and was last seen by them on 12/01/2017.   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 Had cardiac cath at [facility] on 12/12/2017 requiring DES to the RCA. Now on Plavix.  He was last seem by Dr. Debara Pickett on 10/17/2017 for and had recent echocardiogram revealing a LVEF of 60%-65% and moderate Aortic stenosis. He had been admitted for acute renal failure which did not require dialysis. He was scheduled for a fistula placement.   He comes today clearly fluid overloaded with 2+3+ pitting edema in the right leg, having gained over 10 lbs. He was advised to go to ER yesterday but wanted to wait for this appointment. He is dyspneic, falls asleep easily. His skin is spilling open and weeping on the lower right extremity.   He has been a patient of [facility] for rehab and has not been adhering to low sodium diet. He has been having worsening cough and congestion.    Interim history TRH assumed care of this patient as he had multiple medical problems.  Patient was admitted by cardiology for volume overload/CHF.  Assessment & Plan   Acute kidney injury on chronic kidney disease, stage IV -Patient's baseline creatinine around 2.5, however on admission was 4.07 -Nephrology consulted and appreciated, may have urinary tract infection, interstitial nephritis.  Does not feel patient would be a hemodialysis candidate given his hypotension. -will continue to monitor BMP closely as patient will be getting  IV diuresis -Creatinine currently 3.67 -Renal ultrasound showed no evidence of hydronephrosis.    Acute on chronic combined CHF -Echocardiogram August 2019 showed an EF of 45 to 50%, diffuse hypokinesis, grade 2 diastolic dysfunction -BNP 1394 -Patient currently on IV Lasix 60 mg 3 times daily -Monitor intake and output, daily weights -Cardiology following -Echocardiogram EF 30-35%, diffuse hypokinesis.   Anemia -Likely multifactorial including chronic kidney disease, volume overload with CHF -hemoglobin on admission was 7, patient was given 1 unit PRBC -Upon review of patient's chart, hemoglobin was approximately 9-10 in 2018 (per care everywhere) -Hemoglobin 7.4 today -Anemia panel showed adequate iron -Nephrology starting darbepoetin -FOBT + -Reviewed patient's chart via care everywhere, it seems that he has been following up with gastroenterology [place], note from 11/22/2017 noted that patient and daughter deferred a small bowel capsule endoscopy as patient's hemoglobin was stable and no bleeding was noted. -He also has had a EGD showing mild nonspecific gastropathy.  Colonoscopy showed a 7 mm sessile sigmoid polyp which was removed, grade 2 internal hemorrhoids. -Discussed with gastroenterology, unfortunately capsule endoscopy cannot be done or read this weekend, they recommended outpatient capsule endoscopy.  Aortic stenosis -Echocardiogram showed moderate to moderate severe, valve severely calcified with mildly reduced leaflet mobility.  Mean systolic gradient through valve is 28 mmHg -Cardiology following  Chronic atrial fibrillation -Continue Coumadin, diltiazem, metoprolol  Coronary artery disease -Patient had catheterization earlier this month with drug-eluting stent placed in the RCA. -Currently no complaints of chest pain -Continue  Plavix and management per cardiology  Diabetes mellitus, type II -Continue insulin sliding scale and CBG  monitoring  Right lateral heel decubitus ulcer -Wound care consulted  History of prostate cancer -Status post radioactive seed placement -? Source of anemia in conjunction with coumadin   DVT Prophylaxis  Coumadin  Code Status: Full  Family Communication: None at bedside  Disposition Plan: Admitted. Pending cardiology and nephrology recommendations for CHF and CKD. Dispo pending (patient would like to go home with 24/7 care)  Consultants Nephrology Cardiology Gastroenterology Palliative care  Procedures  Echcoardiogram  Antibiotics   Anti-infectives (From admission, onward)   None      Subjective:   Greg Booker seen and examined today.  Feeling mildly better today.  Denies current chest pain, shortness of breath, abdominal pain, nausea or vomiting, diarrhea or constipation, dizziness or headache.  Continues to have some weakness. Complains of cough. Objective:   Vitals:   01/05/18 1952 01/06/18 0526 01/06/18 0752 01/06/18 0901  BP: 98/60 (!) 103/47 (!) 131/92 (!) 97/49  Pulse: (!) 128 92 90 94  Resp: (!) 29 (!) 21 19   Temp: 99.1 F (37.3 C) 97.7 F (36.5 C) 97.8 F (36.6 C)   TempSrc: Oral Oral Oral   SpO2: 94% 97% 98%   Weight:      Height:        Intake/Output Summary (Last 24 hours) at 01/06/2018 1342 Last data filed at 01/06/2018 0700 Gross per 24 hour  Intake 423.87 ml  Output 950 ml  Net -526.13 ml   Filed Weights   01/03/18 1259 01/04/18 0514 01/05/18 0603  Weight: 77.6 kg 74.6 kg 71.3 kg   Exam  General: Well developed, chronically ill appearing, NAD  HEENT: NCAT, mucous membranes moist.   Neck: Supple  Cardiovascular: S1 S2 auscultated, 3/6 SEM, RRR  Respiratory: Clear to auscultation bilaterally with equal chest rise  Abdomen: Soft, nontender, nondistended, + bowel sounds  Extremities: RLE edema improved with dressing in place- venous stasis skin changes, Left AKA  Neuro: AAOx3, nonfocal  Psych: appropriate mood and affect,  pleasant   Data Reviewed: I have personally reviewed following labs and imaging studies  CBC: Recent Labs  Lab 01/03/18 1303 01/04/18 1451 01/05/18 0326 01/06/18 0445  WBC 12.5*  --  9.1  --   NEUTROABS 10.6*  --   --   --   HGB 7.0* 7.0* 8.7* 7.4*  HCT 21.9* 20.8* 24.7* 21.5*  MCV 102.8*  --  100.0  --   PLT 282  --  247  --    Basic Metabolic Panel: Recent Labs  Lab 01/03/18 1303 01/04/18 0402 01/05/18 0326 01/06/18 0445  NA 137 140 136 141  K 3.9 3.6 3.1* 4.5  CL 99 100 99 103  CO2 25 26 24 22   GLUCOSE 129* 119* 112* 147*  BUN 64* 64* 72* 83*  CREATININE 4.07* 3.95* 3.79* 3.67*  CALCIUM 8.9 8.5* 8.5* 8.7*   GFR: Estimated Creatinine Clearance: 14.4 mL/min (A) (by C-G formula based on SCr of 3.67 mg/dL (H)). Liver Function Tests: Recent Labs  Lab 01/03/18 1303  AST 19  ALT 14  ALKPHOS 50  BILITOT 0.8  PROT 6.7  ALBUMIN 2.9*   No results for input(s): LIPASE, AMYLASE in the last 168 hours. No results for input(s): AMMONIA in the last 168 hours. Coagulation Profile: Recent Labs  Lab 01/03/18 1041 01/04/18 0402 01/05/18 0326 01/06/18 0445  INR 2.8 1.97 1.89 1.90   Cardiac Enzymes: No results for  input(s): CKTOTAL, CKMB, CKMBINDEX, TROPONINI in the last 168 hours. BNP (last 3 results) No results for input(s): PROBNP in the last 8760 hours. HbA1C: No results for input(s): HGBA1C in the last 72 hours. CBG: Recent Labs  Lab 01/05/18 0812 01/05/18 1147 01/05/18 1723 01/06/18 0751 01/06/18 1206  GLUCAP 103* 176* 131* 135* 122*   Lipid Profile: No results for input(s): CHOL, HDL, LDLCALC, TRIG, CHOLHDL, LDLDIRECT in the last 72 hours. Thyroid Function Tests: No results for input(s): TSH, T4TOTAL, FREET4, T3FREE, THYROIDAB in the last 72 hours. Anemia Panel: Recent Labs    01/03/18 1657  TIBC 231*  IRON 80   Urine analysis:    Component Value Date/Time   COLORURINE YELLOW 01/03/2018 1603   APPEARANCEUR CLEAR 01/03/2018 1603   LABSPEC  1.011 01/03/2018 1603   PHURINE 6.0 01/03/2018 1603   GLUCOSEU NEGATIVE 01/03/2018 1603   HGBUR MODERATE (A) 01/03/2018 1603   BILIRUBINUR NEGATIVE 01/03/2018 1603   KETONESUR NEGATIVE 01/03/2018 1603   PROTEINUR NEGATIVE 01/03/2018 1603   UROBILINOGEN 0.2 10/29/2011 0945   NITRITE NEGATIVE 01/03/2018 1603   LEUKOCYTESUR TRACE (A) 01/03/2018 1603   Sepsis Labs: @LABRCNTIP (procalcitonin:4,lacticidven:4)  ) Recent Results (from the past 240 hour(s))  Urine Culture     Status: None   Collection Time: 01/05/18  2:22 AM  Result Value Ref Range Status   Specimen Description URINE, CATHETERIZED  Final   Special Requests Normal  Final   Culture   Final    NO GROWTH Performed at [facility], [place] [address] [place], [place] [address]    Report Status 01/06/2018 FINAL  Final      Radiology Studies: Dg Chest 2 View  Result Date: 01/05/2018 CLINICAL DATA:  Cough. EXAM: CHEST - 2 VIEW COMPARISON:  01/04/2018 FINDINGS: Stable mild enlargement of the cardiopericardial silhouette. Increased retrocardiac airspace opacity. Atherosclerotic calcification of the aortic arch. Calcified granulomas at the right lung base. Bilateral interstitial accentuation similar to prior. Blunting of both costophrenic angles posteriorly. Bony demineralization. IMPRESSION: 1. Cardiomegaly with interstitial edema. 2. Small bilateral pleural effusions. Increased airspace opacity in the retrocardiac region potentially from increased atelectasis or pneumonia. 3. Old granulomatous disease. Electronically Signed   By: Van Clines M.D.   On: 01/05/2018 09:15     Scheduled Meds: . B-complex with vitamin C  1 tablet Oral Daily  . bicalutamide  50 mg Oral Daily  . clopidogrel  75 mg Oral Daily  . darbepoetin (ARANESP) injection - NON-DIALYSIS  200 mcg Subcutaneous Q Wed-1800  . dextromethorphan-guaiFENesin  1 tablet Oral BID  . furosemide  60 mg Intravenous Q8H  . gabapentin  100 mg Oral QHS  . insulin  aspart  0-9 Units Subcutaneous TID WC  . metoprolol tartrate  25 mg Oral BID  . multivitamin with minerals  1 tablet Oral Daily  . pantoprazole  40 mg Oral QAC breakfast  . senna  2 tablet Oral QHS  . sodium chloride flush  3 mL Intravenous Q12H  . tamsulosin  0.4 mg Oral QPC supper  . Warfarin - Pharmacist Dosing Inpatient   Does not apply q1800   Continuous Infusions: . sodium chloride    . amiodarone 30 mg/hr (01/06/18 0520)     LOS: 3 days   Time Spent in minutes   45 minutes (greater than 50% of time spent with patient face to face, as well as reviewing old records, calling consults, and formulating a plan)  Cristal Ford D.O. on 01/06/2018 at 1:42 PM  Between 7am to 7pm - Please see pager noted on [email or web address]  After 7pm go to [email or web address]  And look for the night coverage person covering for me after hours  [facility] Office  [phone]

## 2018-01-06 NOTE — Plan of Care (Signed)
  Problem: Education: Goal: Knowledge of General Education information will improve Description: Including pain rating scale, medication(s)/side effects and non-pharmacologic comfort measures Outcome: Progressing   Problem: Clinical Measurements: Goal: Ability to maintain clinical measurements within normal limits will improve Outcome: Progressing Goal: Will remain free from infection Outcome: Progressing   

## 2018-01-07 LAB — BASIC METABOLIC PANEL
Anion gap: 15 (ref 5–15)
BUN: 80 mg/dL — ABNORMAL HIGH (ref 8–23)
CHLORIDE: 99 mmol/L (ref 98–111)
CO2: 24 mmol/L (ref 22–32)
Calcium: 8.9 mg/dL (ref 8.9–10.3)
Creatinine, Ser: 3.44 mg/dL — ABNORMAL HIGH (ref 0.61–1.24)
GFR calc Af Amer: 18 mL/min — ABNORMAL LOW (ref >60)
GFR calc non Af Amer: 15 mL/min — ABNORMAL LOW (ref >60)
Glucose, Bld: 135 mg/dL — ABNORMAL HIGH (ref 70–99)
POTASSIUM: 3.7 mmol/L (ref 3.5–5.1)
Sodium: 138 mmol/L (ref 135–145)

## 2018-01-07 LAB — PROTIME-INR
INR: 2.02
Prothrombin Time: 22.6 seconds — ABNORMAL HIGH (ref 11.4–15.2)

## 2018-01-07 LAB — HEPATIC FUNCTION PANEL
ALT: 12 U/L (ref 0–44)
AST: 23 U/L (ref 15–41)
Albumin: 2.8 g/dL — ABNORMAL LOW (ref 3.5–5.0)
Alkaline Phosphatase: 49 U/L (ref 38–126)
Bilirubin, Direct: 0.3 mg/dL — ABNORMAL HIGH (ref 0.0–0.2)
Indirect Bilirubin: 0.3 mg/dL (ref 0.3–0.9)
Total Bilirubin: 0.6 mg/dL (ref 0.3–1.2)
Total Protein: 6.3 g/dL — ABNORMAL LOW (ref 6.5–8.1)

## 2018-01-07 LAB — CBC
HCT: 23.1 % — ABNORMAL LOW (ref 39.0–52.0)
Hemoglobin: 7.4 g/dL — ABNORMAL LOW (ref 13.0–17.0)
MCH: 33 pg (ref 26.0–34.0)
MCHC: 32 g/dL (ref 30.0–36.0)
MCV: 103.1 fL — ABNORMAL HIGH (ref 80.0–100.0)
Platelets: 267 10*3/uL (ref 150–400)
RBC: 2.24 MIL/uL — AB (ref 4.22–5.81)
RDW: 19.5 % — ABNORMAL HIGH (ref 11.5–15.5)
WBC: 8.2 10*3/uL (ref 4.0–10.5)
nRBC: 1.3 % — ABNORMAL HIGH (ref 0.0–0.2)

## 2018-01-07 LAB — HEMOGLOBIN AND HEMATOCRIT, BLOOD
HCT: 25.7 % — ABNORMAL LOW (ref 39.0–52.0)
Hemoglobin: 8.5 g/dL — ABNORMAL LOW (ref 13.0–17.0)

## 2018-01-07 LAB — GLUCOSE, CAPILLARY
GLUCOSE-CAPILLARY: 122 mg/dL — AB (ref 70–99)
Glucose-Capillary: 124 mg/dL — ABNORMAL HIGH (ref 70–99)
Glucose-Capillary: 135 mg/dL — ABNORMAL HIGH (ref 70–99)
Glucose-Capillary: 141 mg/dL — ABNORMAL HIGH (ref 70–99)

## 2018-01-07 LAB — PREPARE RBC (CROSSMATCH)

## 2018-01-07 LAB — T4, FREE: Free T4: 1.04 ng/dL (ref 0.82–1.77)

## 2018-01-07 LAB — TSH: TSH: 4.817 u[IU]/mL — ABNORMAL HIGH (ref 0.350–4.500)

## 2018-01-07 MED ORDER — WARFARIN SODIUM 2.5 MG PO TABS
2.5000 mg | ORAL_TABLET | Freq: Once | ORAL | Status: AC
  Administered 2018-01-07: 2.5 mg via ORAL
  Filled 2018-01-07: qty 1

## 2018-01-07 MED ORDER — FUROSEMIDE 10 MG/ML IJ SOLN
80.0000 mg | Freq: Three times a day (TID) | INTRAMUSCULAR | Status: DC
  Administered 2018-01-08 – 2018-01-09 (×3): 80 mg via INTRAVENOUS
  Filled 2018-01-07 (×4): qty 8

## 2018-01-07 MED ORDER — FUROSEMIDE 10 MG/ML IJ SOLN
20.0000 mg | Freq: Once | INTRAMUSCULAR | Status: AC
  Administered 2018-01-07: 20 mg via INTRAVENOUS
  Filled 2018-01-07: qty 2

## 2018-01-07 MED ORDER — SODIUM CHLORIDE 0.9% IV SOLUTION
Freq: Once | INTRAVENOUS | Status: AC
  Administered 2018-01-07: 15:00:00 via INTRAVENOUS

## 2018-01-07 MED ORDER — FUROSEMIDE 8 MG/ML PO SOLN
80.0000 mg | Freq: Once | ORAL | Status: AC
  Administered 2018-01-07: 80 mg via ORAL
  Filled 2018-01-07 (×4): qty 10

## 2018-01-07 NOTE — Progress Notes (Signed)
PROGRESS NOTE    Greg Booker  BOF:[id number] DOB: 01/12/1930 DOA: 01/03/2018 PCP: Raina Mina., MD   Brief Narrative:  HPI on 01/03/2018 by Ms. Jory Sims, NP (cardiology) Greg Booker is a 84 y.o. male who presents for ongoing assessment and management of CAD with most recent cath in 2006 revealed an occluded RCA with collaterals. Hx of DVT on coumadin  therapy.She is followed in our coumadin clinic and was last seen by them on 12/01/2017.   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 Had cardiac cath at [facility] on 12/12/2017 requiring DES to the RCA. Now on Plavix.  He was last seem by Dr. Debara Pickett on 10/17/2017 for and had recent echocardiogram revealing a LVEF of 60%-65% and moderate Aortic stenosis. He had been admitted for acute renal failure which did not require dialysis. He was scheduled for a fistula placement.   He comes today clearly fluid overloaded with 2+3+ pitting edema in the right leg, having gained over 10 lbs. He was advised to go to ER yesterday but wanted to wait for this appointment. He is dyspneic, falls asleep easily. His skin is spilling open and weeping on the lower right extremity.   He has been a patient of [facility] for rehab and has not been adhering to low sodium diet. He has been having worsening cough and congestion.    Interim history TRH assumed care of this patient as he had multiple medical problems.  Patient was admitted by cardiology for volume overload/CHF.  Assessment & Plan   Acute kidney injury on chronic kidney disease, stage IV -Patient's baseline creatinine around 2.5, however on admission was 4.07 -Nephrology consulted and appreciated, may have urinary tract infection, interstitial nephritis.  Does not feel patient would be a hemodialysis candidate given his hypotension. -Continue to monitor BMP closely as patient will be getting IV  diuresis -Creatinine currently 3.44 -Renal ultrasound showed no evidence of hydronephrosis.    Acute on chronic combined CHF -Echocardiogram August 2019 showed an EF of 45 to 50%, diffuse hypokinesis, grade 2 diastolic dysfunction -BNP 1394 -Patient currently on IV Lasix 60 mg 3 times daily -Of note, patient will be giving an extra dose of IV Lasix 20 mg post blood transfusion today. -Monitor intake and output, daily weights-urine output 525 cc over the past 24 hours -Cardiology following -Echocardiogram EF 30-35%, diffuse hypokinesis.   Chronic anemia -Likely multifactorial including chronic kidney disease, volume overload with CHF -hemoglobin on admission was 7, patient was given 1 unit PRBC -Upon review of patient's chart, hemoglobin was approximately 9-10 in 2018 (per care everywhere) -Hemoglobin 7.4 today- stable, however, will transfuse additional unit of PRBC -Anemia panel showed adequate iron -Nephrology starting darbepoetin -FOBT + -Reviewed patient's chart via care everywhere, it seems that he has been following up with gastroenterology [place], note from 11/22/2017 noted that patient and daughter deferred a small bowel capsule endoscopy as patient's hemoglobin was stable and no bleeding was noted. -He also has had a EGD showing mild nonspecific gastropathy.  Colonoscopy showed a 7 mm sessile sigmoid polyp which was removed, grade 2 internal hemorrhoids. -Discussed with gastroenterology, unfortunately capsule endoscopy cannot be done or read this weekend, they recommended outpatient capsule endoscopy. -Had a very long and extensive discussion with patient and caregiver at bedside.  Feel that capsule endoscopy may not be needed-if bleeding was to be  found, patient would likely need to discontinue his Coumadin however he is a very high risk of stroke and blood clots if he discontinues his Coumadin. -Caregiver tells me that patient has been receiving occasional  blood transfusions from his hematology oncology doctor in [place].  Aortic stenosis -Echocardiogram showed moderate to moderate severe, valve severely calcified with mildly reduced leaflet mobility.  Mean systolic gradient through valve is 28 mmHg -Cardiology following  Chronic atrial fibrillation -Continue Coumadin, diltiazem, metoprolol  Coronary artery disease -Patient had catheterization earlier this month with drug-eluting stent placed in the RCA. -Currently no complaints of chest pain -Continue Plavix and management per cardiology  Diabetes mellitus, type II -Continue insulin sliding scale and CBG monitoring  Right lateral heel decubitus ulcer -Wound care consulted  History of prostate cancer -Status post radioactive seed placement -? Source of anemia in conjunction with coumadin  -Continue to follow-up with oncology upon discharge  DVT Prophylaxis  Coumadin  Code Status: Full  Family Communication: None at bedside  Disposition Plan: Admitted. Pending cardiology and nephrology recommendations for CHF and CKD. Dispo pending (patient would like to go home with 24/7 care)  Consultants Nephrology Cardiology Gastroenterology Palliative care  Procedures  Echcoardiogram  Antibiotics   Anti-infectives (From admission, onward)   None      Subjective:   Greg Booker seen and examined today.  Patient feeling mildly better today.  Caregiver at bedside states his color is a little yellow today.  He currently denies chest pain, shortness of breath, abdominal pain, nausea or vomiting, diarrhea or constipation, dizziness or headache.  He does complain of some weakness and productive cough.   Objective:   Vitals:   01/06/18 2251 01/07/18 0600 01/07/18 0602 01/07/18 0825  BP:   (!) 109/59 (!) 113/58  Pulse: (!) 105  73   Resp:   (!) 21   Temp:   97.9 F (36.6 C)   TempSrc:   Oral   SpO2:   99%   Weight:  74.1 kg    Height:        Intake/Output Summary (Last 24  hours) at 01/07/2018 1104 Last data filed at 01/07/2018 0900 Gross per 24 hour  Intake 471.87 ml  Output 525 ml  Net -53.13 ml   Filed Weights   01/04/18 0514 01/05/18 0603 01/07/18 0600  Weight: 74.6 kg 71.3 kg 74.1 kg   Exam  General: Well developed, chronically ill-appearing, NAD  HEENT: NCAT,  mucous membranes moist.   Neck: Supple  Cardiovascular: S1 S2 auscultated, 3/6 SEM, RRR  Respiratory: Clear to auscultation bilaterally with equal chest rise  Abdomen: Soft, nontender, nondistended, + bowel sounds  Extremities: Left AKA.  RLE edema improved, dressing in place, venous stasis skin changes.  Neuro: AAOx3, nonfocal  Psych: Pleasant, appropriate mood and affect.  Data Reviewed: I have personally reviewed following labs and imaging studies  CBC: Recent Labs  Lab 01/03/18 1303 01/04/18 1451 01/05/18 0326 01/06/18 0445 01/07/18 0336  WBC 12.5*  --  9.1  --  8.2  NEUTROABS 10.6*  --   --   --   --   HGB 7.0* 7.0* 8.7* 7.4* 7.4*  HCT 21.9* 20.8* 24.7* 21.5* 23.1*  MCV 102.8*  --  100.0  --  103.1*  PLT 282  --  247  --  341   Basic Metabolic Panel: Recent Labs  Lab 01/03/18 1303 01/04/18 0402 01/05/18 0326 01/06/18 0445 01/07/18 0336  NA 137 140 136 141 138  K 3.9 3.6 3.1*  4.5 3.7  CL 99 100 99 103 99  CO2 25 26 24 22 24   GLUCOSE 129* 119* 112* 147* 135*  BUN 64* 64* 72* 83* 80*  CREATININE 4.07* 3.95* 3.79* 3.67* 3.44*  CALCIUM 8.9 8.5* 8.5* 8.7* 8.9   GFR: Estimated Creatinine Clearance: 15.4 mL/min (A) (by C-G formula based on SCr of 3.44 mg/dL (H)). Liver Function Tests: Recent Labs  Lab 01/03/18 1303 01/07/18 0430  AST 19 23  ALT 14 12  ALKPHOS 50 49  BILITOT 0.8 0.6  PROT 6.7 6.3*  ALBUMIN 2.9* 2.8*   No results for input(s): LIPASE, AMYLASE in the last 168 hours. No results for input(s): AMMONIA in the last 168 hours. Coagulation Profile: Recent Labs  Lab 01/03/18 1041 01/04/18 0402 01/05/18 0326 01/06/18 0445  01/07/18 0336  INR 2.8 1.97 1.89 1.90 2.02   Cardiac Enzymes: No results for input(s): CKTOTAL, CKMB, CKMBINDEX, TROPONINI in the last 168 hours. BNP (last 3 results) No results for input(s): PROBNP in the last 8760 hours. HbA1C: No results for input(s): HGBA1C in the last 72 hours. CBG: Recent Labs  Lab 01/06/18 0751 01/06/18 1206 01/06/18 1635 01/06/18 2108 01/07/18 0738  GLUCAP 135* 122* 134* 124* 122*   Lipid Profile: No results for input(s): CHOL, HDL, LDLCALC, TRIG, CHOLHDL, LDLDIRECT in the last 72 hours. Thyroid Function Tests: Recent Labs    01/07/18 0336  TSH 4.817*  FREET4 1.04   Anemia Panel: No results for input(s): VITAMINB12, FOLATE, FERRITIN, TIBC, IRON, RETICCTPCT in the last 72 hours. Urine analysis:    Component Value Date/Time   COLORURINE YELLOW 01/03/2018 1603   APPEARANCEUR CLEAR 01/03/2018 1603   LABSPEC 1.011 01/03/2018 1603   PHURINE 6.0 01/03/2018 1603   GLUCOSEU NEGATIVE 01/03/2018 1603   HGBUR MODERATE (A) 01/03/2018 1603   BILIRUBINUR NEGATIVE 01/03/2018 1603   KETONESUR NEGATIVE 01/03/2018 1603   PROTEINUR NEGATIVE 01/03/2018 1603   UROBILINOGEN 0.2 10/29/2011 0945   NITRITE NEGATIVE 01/03/2018 1603   LEUKOCYTESUR TRACE (A) 01/03/2018 1603   Sepsis Labs: @LABRCNTIP (procalcitonin:4,lacticidven:4)  ) Recent Results (from the past 240 hour(s))  Urine Culture     Status: None   Collection Time: 01/05/18  2:22 AM  Result Value Ref Range Status   Specimen Description URINE, CATHETERIZED  Final   Special Requests Normal  Final   Culture   Final    NO GROWTH Performed at [facility], [place] [address], [place], [place] [address]    Report Status 01/06/2018 FINAL  Final      Radiology Studies: No results found.   Scheduled Meds: . sodium chloride   Intravenous Once  . B-complex with vitamin C  1 tablet Oral Daily  . bicalutamide  50 mg Oral Daily  . clopidogrel  75 mg Oral Daily  . darbepoetin (ARANESP) injection -  NON-DIALYSIS  200 mcg Subcutaneous Q Wed-1800  . dextromethorphan-guaiFENesin  1 tablet Oral BID  . furosemide  20 mg Intravenous Once  . furosemide  60 mg Intravenous Q8H  . gabapentin  100 mg Oral QHS  . insulin aspart  0-9 Units Subcutaneous TID WC  . metoprolol tartrate  25 mg Oral BID  . multivitamin with minerals  1 tablet Oral Daily  . pantoprazole  40 mg Oral QAC breakfast  . senna  2 tablet Oral QHS  . sodium chloride flush  3 mL Intravenous Q12H  . tamsulosin  0.4 mg Oral QPC supper  . Warfarin - Pharmacist Dosing Inpatient   Does not  apply q1800   Continuous Infusions: . sodium chloride    . amiodarone 30 mg/hr (01/07/18 0620)     LOS: 4 days   Time Spent in minutes   45 minutes (greater than 50% of time spent with patient and caregiver face to face, as well as reviewing old records, discussing with consultants, and formulating a plan)  Cristal Ford D.O. on 01/07/2018 at 11:04 AM  Between 7am to 7pm - Please see pager noted on [email or web address]  After 7pm go to [email or web address]  And look for the night coverage person covering for me after hours  [facility] Office  [phone]

## 2018-01-07 NOTE — Progress Notes (Signed)
   01/07/18 1500  Clinical Encounter Type  Visited With Family;Patient and family together;Health care provider  Visit Type Initial;Other (Comment) (AD, EOL)  Referral From Nurse  Consult/Referral To Chaplain  Spiritual Encounters  Spiritual Needs Emotional  Stress Factors  Patient Stress Factors Exhausted   Responded to two requests in Epic, one stat.  Called RN at 1154 after stat AD request put into Epic.  Pt is not slated for any procedure nor is he imminently expected to lose ability to make or communicate decisions for himself.  Weekend AD requests are limited to urgent/emergent needs, which was discussed w/ RN.  Visited pt and 2 relatives from [place] in pt room at 2:49pm.  Pt repeatedly fell asleep mid-conversation and not very clear when I tried to ask him about HCPOA desires.  Family present noted that "[name], his caregiver" had the paperwork w/ her and she was not currently at the hospital. Also let family know that weekend AD requests are limited to urgent/emergent needs and that if pt d/c prior to notarization, it can be done w/ any notary in the presence of two witnesses who are neither related to the pt nor stand to benefit from the pt's demise.  Also reviewed w/ family, who expressed understanding, that the desire from such paperwork from come from the pt.  Pls enter new AD notarization request when paperwork is present with the pt.  Also, this chaplain will pass along referral for visit to palliative care chaplain resident.    Myra Gianotti resident, [phone]

## 2018-01-07 NOTE — Progress Notes (Signed)
ANTICOAGULATION CONSULT NOTE - Follow Up Consult  Pharmacy Consult for Coumadin Indication: atrial fibrillation  Allergies  Allergen Reactions  . Bicalutamide Other (See Comments)    Casodex caused increase in breast size 01/05/18: confirmed still taking this medication  . Codeine Itching and Rash  . Niacin And Related Other (See Comments)    Burning and stinging from tablets (otc capsules ok)  . Lovastatin Other (See Comments)    Leg weakness  . Morphine Other (See Comments)    "Allergic," per MAR  . Beef Extract Rash  . Pravastatin Other (See Comments)    Myalgia    Patient Measurements: Height: 5\' 9"  (175.3 cm) Weight: 163 lb 5.8 oz (74.1 kg) IBW/kg (Calculated) : 70.7  Vital Signs: Temp: 97.9 F (36.6 C) (12/28 0602) Temp Source: Oral (12/28 0602) BP: 113/58 (12/28 0825) Pulse Rate: 73 (12/28 0602)  Labs: Recent Labs    01/05/18 0326 01/06/18 0445 01/07/18 0336  HGB 8.7* 7.4* 7.4*  HCT 24.7* 21.5* 23.1*  PLT 247  --  267  LABPROT 21.5* 21.6* 22.6*  INR 1.89 1.90 2.02  CREATININE 3.79* 3.67* 3.44*    Estimated Creatinine Clearance: 15.4 mL/min (A) (by C-G formula based on SCr of 3.44 mg/dL (H)).  Assessment:   84 yr old male admitted 12/24 with fluid overload.  To continue Coumadin as prior to admission for atrial fibrillation and hx DVT (2013?).     Caregiver  reported 12/24 most recent Coumadin regimen is 2.5 mg daily except 5 mg on Tuesdays.  She also reports that Coumadin has been held x 2 days d/t INR 4.1 on 12/22 and 3.8 on 12/[facility] at [facility]. INR 2.8 at Cardiology office on 12/24.  Hx anemia. Aranesp 200 mg SQ weekly begun on 12/25.   INR increased into goal range from 1.9 to 2.02. Of note, FOB positive on 12/25. Hgb 7.4, plt 267- stable s/p 2 PRBCs. Noted discussion of risk/benefit of Coumadin, but to continue along with Plavix. Also on amiodarone which was started on 12/26, anticipate delayed impact on INR sensitivity.   Superficial nose bleed noted by  nursing (2 times yesterday, once today) - resolved with packing.   Goal of Therapy:  INR 2-3 Monitor platelets by anticoagulation protocol: Yes   Plan:  Coumadin 2.5 mg today. Daily PT/INR.  Watch for sensitivity to Coumadin with addition of amiodarone. Follow up FOB checks, monitor for any bleeding.  Antonietta Jewel, PharmD, [place] Clinical Pharmacist  Pager: [phone] Phone: [phone] 01/07/2018,11:13 AM

## 2018-01-07 NOTE — Progress Notes (Addendum)
Progress Note  Patient Name: Greg Booker Date of Encounter: 01/07/2018  Primary Cardiologist: Pixie Casino, MD   Subjective   Denies any CP.  Breathing has improved. Hbg down to 7.4 today and getting more blood.  Inpatient Medications    Scheduled Meds: . sodium chloride   Intravenous Once  . B-complex with vitamin C  1 tablet Oral Daily  . bicalutamide  50 mg Oral Daily  . clopidogrel  75 mg Oral Daily  . darbepoetin (ARANESP) injection - NON-DIALYSIS  200 mcg Subcutaneous Q Wed-1800  . dextromethorphan-guaiFENesin  1 tablet Oral BID  . furosemide  60 mg Intravenous Q8H  . gabapentin  100 mg Oral QHS  . insulin aspart  0-9 Units Subcutaneous TID WC  . metoprolol tartrate  25 mg Oral BID  . multivitamin with minerals  1 tablet Oral Daily  . pantoprazole  40 mg Oral QAC breakfast  . senna  2 tablet Oral QHS  . sodium chloride flush  3 mL Intravenous Q12H  . tamsulosin  0.4 mg Oral QPC supper  . warfarin  2.5 mg Oral ONCE-1800  . Warfarin - Pharmacist Dosing Inpatient   Does not apply q1800   Continuous Infusions: . sodium chloride    . amiodarone 30 mg/hr (01/07/18 0620)   PRN Meds: sodium chloride, acetaminophen, albuterol, ALPRAZolam, alum & mag hydroxide-simeth, metoprolol tartrate, ondansetron (ZOFRAN) IV, sodium chloride flush   Vital Signs    Vitals:   01/07/18 1427 01/07/18 1430 01/07/18 1447 01/07/18 1525  BP:    (!) 87/58  Pulse: (!) 105  (!) 105 98  Resp:    15  Temp: 98.4 F (36.9 C) 98.4 F (36.9 C) 98 F (36.7 C) (!) 97.5 F (36.4 C)  TempSrc: Oral  Oral Axillary  SpO2:    100%  Weight:      Height:        Intake/Output Summary (Last 24 hours) at 01/07/2018 1635 Last data filed at 01/07/2018 1200 Gross per 24 hour  Intake 471.87 ml  Output 815 ml  Net -343.13 ml   Filed Weights   01/04/18 0514 01/05/18 0603 01/07/18 0600  Weight: 74.6 kg 71.3 kg 74.1 kg    Telemetry    Atrial fibrillation with borderline RVR  - Personally  Reviewed  ECG    No new EKG to review - Personally Reviewed  Physical Exam   GEN: No acute distress.   Neck: No JVD Cardiac: irregularly irregular, no murmurs, rubs, or gallops.  Respiratory: Clear to auscultation bilaterally. GI: Soft, nontender, non-distended  MS: No edema; No deformity. Neuro:  Nonfocal  Psych: Normal affect   Labs    Chemistry Recent Labs  Lab 01/03/18 1303  01/05/18 0326 01/06/18 0445 01/07/18 0336 01/07/18 0430  NA 137   < > 136 141 138  --   K 3.9   < > 3.1* 4.5 3.7  --   CL 99   < > 99 103 99  --   CO2 25   < > 24 22 24   --   GLUCOSE 129*   < > 112* 147* 135*  --   BUN 64*   < > 72* 83* 80*  --   CREATININE 4.07*   < > 3.79* 3.67* 3.44*  --   CALCIUM 8.9   < > 8.5* 8.7* 8.9  --   PROT 6.7  --   --   --   --  6.3*  ALBUMIN 2.9*  --   --   --   --  2.8*  AST 19  --   --   --   --  23  ALT 14  --   --   --   --  12  ALKPHOS 50  --   --   --   --  49  BILITOT 0.8  --   --   --   --  0.6  GFRNONAA 12*   < > 14* 14* 15*  --   GFRAA 14*   < > 16* 16* 18*  --   ANIONGAP 13   < > 13 16* 15  --    < > = values in this interval not displayed.     Hematology Recent Labs  Lab 01/03/18 1303  01/05/18 0326 01/06/18 0445 01/07/18 0336  WBC 12.5*  --  9.1  --  8.2  RBC 2.13*  --  2.47*  --  2.24*  HGB 7.0*   < > 8.7* 7.4* 7.4*  HCT 21.9*   < > 24.7* 21.5* 23.1*  MCV 102.8*  --  100.0  --  103.1*  MCH 32.9  --  35.2*  --  33.0  MCHC 32.0  --  35.2  --  32.0  RDW 18.2*  --  19.4*  --  19.5*  PLT 282  --  247  --  267   < > = values in this interval not displayed.    Cardiac EnzymesNo results for input(s): TROPONINI in the last 168 hours. No results for input(s): TROPIPOC in the last 168 hours.   BNP Recent Labs  Lab 01/03/18 1303  BNP 1,394.2*     DDimer No results for input(s): DDIMER in the last 168 hours.   Radiology    No results found.  Cardiac Studies   2D echo Study Conclusions  - Left ventricle: The cavity size was  normal. Systolic function was   moderately to severely reduced. The estimated ejection fraction   was in the range of 30% to 35%. Diffuse hypokinesis. Stroke   volume: 71 ml. Stroke volume/bsa: 38 ml/m^2. - Aortic valve: Valve mobility was moderately restricted. There was   trivial regurgitation. Mean gradient (S): 28 mm Hg. Valve area   (Vmax): 1 cm^2. - Mitral valve: Calcified annulus. There was trivial regurgitation. - Left atrium: The atrium was moderately dilated. - Right ventricle: Systolic function was mildly to moderately   reduced.  Impressions:  - Exam performed in atrial fibrillation with RVR. LVEF is 30-35%.   RV systolic function is mild-moderately reduced.     Aortic stenosis is likely moderate to moderate-severe. The valve   is severely calcified, with moderately reduced leaflet mobility.   Stroke volume index is 38 mL/m2 despite reduced ejection   fraction. Mean systolic gradient through AV is 28 mmHg. Valve   area is subject to error from LVOT diameter and LVOT TVI,   calculates to approximately 1 cm2.     Gradient was interrogated from multiple windows with apical   window producing highest gradient.  Patient Profile     84 y.o. male with AKI on CKD, acute decompensated diastolic CHF, PAF, PAD s/p amputation and anemia, admitted 12/24 for CHF.  Assessment & Plan    1. Anemia -FOB + and has received several units of blood -Hbg bumped to 8.7 on 12/26 but now 7.4 today. -plan to give PRBCs again today -per TRH/GI  2.  Afib with RVR - HR improved - Now on Amio at 30mg /hr for better rate control  and Lopressor 25mg  BID -Transfusion may have helped also with better rate control  3.  AS -May be more severe than previously thought.  -repeat echo showed worsening LVF with EF 30-35% with diffuse HK and moderate to severe AS.  ? Tachy induced CM -mean gradient 39mmHg but likely underestimated due to LV dysfunction  -VTI ratio 0.23 c/w severe AS  4.  Acute on  chronic combined systolic and diastolic CHF -I/O net neg 4 L since admit but only put out 225cc yesterday.   wt down 8lbs from admit but up 6lbs from the 26th.  -cxray with interstitial edema on 12/26 -creatinine slightly improved with diuresis -increase lasix to 80mg  IV TID  5.  Chronic anticoag - INR 2.02 today, per Pharmacy  6.  CKD IV/V -Renal following -Cr improved from admission but still very high  7.  Hypokalemia  -2nd diuresis -K+ 3.7 today -continue to follow    I have spent a total of 40 minutes with patient reviewing 2D echo , telemetry, EKGs, labs and examining patient as well as establishing an assessment and plan that was discussed with the patient.  > 50% of time was spent in direct patient care.    For questions or updates, please contact [place] Please consult [email or web address] for contact info under Cardiology/STEMI.      Signed, Fransico Him, MD  01/07/2018, 4:35 PM

## 2018-01-08 DIAGNOSIS — I2510 Atherosclerotic heart disease of native coronary artery without angina pectoris: Secondary | ICD-10-CM

## 2018-01-08 LAB — GLUCOSE, CAPILLARY
GLUCOSE-CAPILLARY: 137 mg/dL — AB (ref 70–99)
Glucose-Capillary: 142 mg/dL — ABNORMAL HIGH (ref 70–99)
Glucose-Capillary: 127 mg/dL — ABNORMAL HIGH (ref 70–99)
Glucose-Capillary: 149 mg/dL — ABNORMAL HIGH (ref 70–99)

## 2018-01-08 LAB — PROTIME-INR
INR: 2.31
Prothrombin Time: 25.1 seconds — ABNORMAL HIGH (ref 11.4–15.2)

## 2018-01-08 LAB — BASIC METABOLIC PANEL
ANION GAP: 14 (ref 5–15)
BUN: 74 mg/dL — ABNORMAL HIGH (ref 8–23)
CO2: 25 mmol/L (ref 22–32)
Calcium: 8.7 mg/dL — ABNORMAL LOW (ref 8.9–10.3)
Chloride: 98 mmol/L (ref 98–111)
Creatinine, Ser: 3.23 mg/dL — ABNORMAL HIGH (ref 0.61–1.24)
GFR calc Af Amer: 19 mL/min — ABNORMAL LOW (ref >60)
GFR calc non Af Amer: 16 mL/min — ABNORMAL LOW (ref >60)
Glucose, Bld: 138 mg/dL — ABNORMAL HIGH (ref 70–99)
POTASSIUM: 3.3 mmol/L — AB (ref 3.5–5.1)
Sodium: 137 mmol/L (ref 135–145)

## 2018-01-08 LAB — HEMOGLOBIN AND HEMATOCRIT, BLOOD
HEMATOCRIT: 24.8 % — AB (ref 39.0–52.0)
Hemoglobin: 8.2 g/dL — ABNORMAL LOW (ref 13.0–17.0)

## 2018-01-08 MED ORDER — POTASSIUM CHLORIDE CRYS ER 20 MEQ PO TBCR: 40.0000 meq | EXTENDED_RELEASE_TABLET | Freq: Every day | ORAL | Status: DC

## 2018-01-08 MED ORDER — AMIODARONE HCL IN DEXTROSE 360-4.14 MG/200ML-% IV SOLN
60.0000 mg/h | INTRAVENOUS | Status: AC
  Administered 2018-01-08: 60 mg/h via INTRAVENOUS

## 2018-01-08 MED ORDER — AMIODARONE LOAD VIA INFUSION
150.0000 mg | Freq: Once | INTRAVENOUS | Status: AC
  Administered 2018-01-08: 150 mg via INTRAVENOUS
  Filled 2018-01-08: qty 83.34

## 2018-01-08 MED ORDER — AMIODARONE HCL 200 MG PO TABS: 200.0000 mg | ORAL_TABLET | Freq: Two times a day (BID) | ORAL | Status: DC

## 2018-01-08 MED ORDER — WARFARIN SODIUM 2 MG PO TABS
2.0000 mg | ORAL_TABLET | Freq: Once | ORAL | Status: AC
  Administered 2018-01-08: 2 mg via ORAL
  Filled 2018-01-08: qty 1

## 2018-01-08 MED ORDER — AMIODARONE HCL IN DEXTROSE 360-4.14 MG/200ML-% IV SOLN
30.0000 mg/h | INTRAVENOUS | Status: DC
  Administered 2018-01-08 – 2018-01-09 (×2): 30 mg/h via INTRAVENOUS
  Filled 2018-01-08 (×3): qty 200

## 2018-01-08 MED ORDER — POTASSIUM CHLORIDE CRYS ER 20 MEQ PO TBCR
40.0000 meq | EXTENDED_RELEASE_TABLET | Freq: Once | ORAL | Status: AC
  Administered 2018-01-08: 40 meq via ORAL
  Filled 2018-01-08: qty 2

## 2018-01-08 NOTE — Progress Notes (Signed)
  Amiodarone Drug - Drug Interaction Consult Note  Recommendations: No changes to current medications. Monitor vital signs and for future medication interactions.   Amiodarone is metabolized by the cytochrome P450 system and therefore has the potential to cause many drug interactions. Amiodarone has an average plasma half-life of 50 days (range 20 to 100 days).   There is potential for drug interactions to occur several weeks or months after stopping treatment and the onset of drug interactions may be slow after initiating amiodarone.   []  Statins: Increased risk of myopathy. Simvastatin- restrict dose to 20mg  daily. Other statins: counsel patients to report any muscle pain or weakness immediately.  []  Anticoagulants: Amiodarone can increase anticoagulant effect. Consider warfarin dose reduction. Patients should be monitored closely and the dose of anticoagulant altered accordingly, remembering that amiodarone levels take several weeks to stabilize.  []  Antiepileptics: Amiodarone can increase plasma concentration of phenytoin, the dose should be reduced. Note that small changes in phenytoin dose can result in large changes in levels. Monitor patient and counsel on signs of toxicity.  [x]  Beta blockers: increased risk of bradycardia, AV block and myocardial depression. Sotalol - avoid concomitant use.  []   Calcium channel blockers (diltiazem and verapamil): increased risk of bradycardia, AV block and myocardial depression.  []   Cyclosporine: Amiodarone increases levels of cyclosporine. Reduced dose of cyclosporine is recommended.  []  Digoxin dose should be halved when amiodarone is started.  [x]  Diuretics: increased risk of cardiotoxicity if hypokalemia occurs.  []  Oral hypoglycemic agents (glyburide, glipizide, glimepiride): increased risk of hypoglycemia. Patient's glucose levels should be monitored closely when initiating amiodarone therapy.   []  Drugs that prolong the QT interval:   Torsades de pointes risk may be increased with concurrent use - avoid if possible.  Monitor QTc, also keep magnesium/potassium WNL if concurrent therapy can't be avoided. Marland Kitchen Antibiotics: e.g. fluoroquinolones, erythromycin. . Antiarrhythmics: e.g. quinidine, procainamide, disopyramide, sotalol. . Antipsychotics: e.g. phenothiazines, haloperidol.  . Lithium, tricyclic antidepressants, and methadone.  Thank You,  Antonietta Jewel, PharmD, [facility] Clinical Pharmacist  Pager: [phone] Phone: [phone] 01/08/2018 10:29 AM

## 2018-01-08 NOTE — Progress Notes (Signed)
Held pm metoprolol per verbal order from cardiology on-call MD d/t low BP

## 2018-01-08 NOTE — Progress Notes (Addendum)
Progress Note  Patient Name: Greg Booker Date of Encounter: 01/08/2018  Primary Cardiologist: Pixie Casino, MD   Subjective   Currently sitting up in the chair and eager to go home.  Denies any chest pain or shortness of breath.  Heart rate currently in the 130s.  Inpatient Medications    Scheduled Meds: . B-complex with vitamin C  1 tablet Oral Daily  . bicalutamide  50 mg Oral Daily  . clopidogrel  75 mg Oral Daily  . darbepoetin (ARANESP) injection - NON-DIALYSIS  200 mcg Subcutaneous Q Wed-1800  . dextromethorphan-guaiFENesin  1 tablet Oral BID  . furosemide  80 mg Intravenous Q8H  . gabapentin  100 mg Oral QHS  . insulin aspart  0-9 Units Subcutaneous TID WC  . metoprolol tartrate  25 mg Oral BID  . multivitamin with minerals  1 tablet Oral Daily  . pantoprazole  40 mg Oral QAC breakfast  . senna  2 tablet Oral QHS  . sodium chloride flush  3 mL Intravenous Q12H  . tamsulosin  0.4 mg Oral QPC supper  . Warfarin - Pharmacist Dosing Inpatient   Does not apply q1800   Continuous Infusions: . sodium chloride    . amiodarone 30 mg/hr (01/08/18 0521)   PRN Meds: sodium chloride, acetaminophen, albuterol, ALPRAZolam, alum & mag hydroxide-simeth, metoprolol tartrate, ondansetron (ZOFRAN) IV, sodium chloride flush   Vital Signs    Vitals:   01/07/18 1525 01/07/18 1901 01/07/18 2128 01/08/18 0548  BP: (!) 87/58 (!) 87/68 100/65 122/85  Pulse: 98 (!) 116 (!) 111 (!) 109  Resp: 15  18 18   Temp: (!) 97.5 F (36.4 C) 98 F (36.7 C) 98.2 F (36.8 C) 98 F (36.7 C)  TempSrc: Axillary Oral Oral Oral  SpO2: 100%  100% 100%  Weight:    72.5 kg  Height:        Intake/Output Summary (Last 24 hours) at 01/08/2018 1007 Last data filed at 01/08/2018 0400 Gross per 24 hour  Intake 1110.75 ml  Output 890 ml  Net 220.75 ml   Filed Weights   01/05/18 0603 01/07/18 0600 01/08/18 0548  Weight: 71.3 kg 74.1 kg 72.5 kg    Telemetry    Atrial fibrillation with RVR  VR to 135 bpm- Personally Reviewed  ECG    No new EKG to review- Personally Reviewed  Physical Exam   GEN: Well nourished, well developed in no acute distress HEENT: Normal NECK: No JVD; No carotid bruits LYMPHATICS: No lymphadenopathy CARDIAC: Irregularly irregular tachycardic, no murmurs, rubs, gallops RESPIRATORY:  Clear to auscultation without rales, wheezing or rhonchi  ABDOMEN: Soft, non-tender, non-distended MUSCULOSKELETAL:  No edema; No deformity  SKIN: Warm and dry NEUROLOGIC:  Alert and oriented x 3 PSYCHIATRIC:  Normal affect    Labs    Chemistry Recent Labs  Lab 01/03/18 1303  01/06/18 0445 01/07/18 0336 01/07/18 0430 01/08/18 0457  NA 137   < > 141 138  --  137  K 3.9   < > 4.5 3.7  --  3.3*  CL 99   < > 103 99  --  98  CO2 25   < > 22 24  --  25  GLUCOSE 129*   < > 147* 135*  --  138*  BUN 64*   < > 83* 80*  --  74*  CREATININE 4.07*   < > 3.67* 3.44*  --  3.23*  CALCIUM 8.9   < > 8.7* 8.9  --  8.7*  PROT 6.7  --   --   --  6.3*  --   ALBUMIN 2.9*  --   --   --  2.8*  --   AST 19  --   --   --  23  --   ALT 14  --   --   --  12  --   ALKPHOS 50  --   --   --  49  --   BILITOT 0.8  --   --   --  0.6  --   GFRNONAA 12*   < > 14* 15*  --  16*  GFRAA 14*   < > 16* 18*  --  19*  ANIONGAP 13   < > 16* 15  --  14   < > = values in this interval not displayed.     Hematology Recent Labs  Lab 01/03/18 1303  01/05/18 0326  01/07/18 0336 01/07/18 2100 01/08/18 0457  WBC 12.5*  --  9.1  --  8.2  --   --   RBC 2.13*  --  2.47*  --  2.24*  --   --   HGB 7.0*   < > 8.7*   < > 7.4* 8.5* 8.2*  HCT 21.9*   < > 24.7*   < > 23.1* 25.7* 24.8*  MCV 102.8*  --  100.0  --  103.1*  --   --   MCH 32.9  --  35.2*  --  33.0  --   --   MCHC 32.0  --  35.2  --  32.0  --   --   RDW 18.2*  --  19.4*  --  19.5*  --   --   PLT 282  --  247  --  267  --   --    < > = values in this interval not displayed.    Cardiac EnzymesNo results for input(s): TROPONINI in the  last 168 hours. No results for input(s): TROPIPOC in the last 168 hours.   BNP Recent Labs  Lab 01/03/18 1303  BNP 1,394.2*     DDimer No results for input(s): DDIMER in the last 168 hours.   Radiology    No results found.  Cardiac Studies   2D echo Study Conclusions  - Left ventricle: The cavity size was normal. Systolic function was   moderately to severely reduced. The estimated ejection fraction   was in the range of 30% to 35%. Diffuse hypokinesis. Stroke   volume: 71 ml. Stroke volume/bsa: 38 ml/m^2. - Aortic valve: Valve mobility was moderately restricted. There was   trivial regurgitation. Mean gradient (S): 28 mm Hg. Valve area   (Vmax): 1 cm^2. - Mitral valve: Calcified annulus. There was trivial regurgitation. - Left atrium: The atrium was moderately dilated. - Right ventricle: Systolic function was mildly to moderately   reduced.  Impressions:  - Exam performed in atrial fibrillation with RVR. LVEF is 30-35%.   RV systolic function is mild-moderately reduced.     Aortic stenosis is likely moderate to moderate-severe. The valve   is severely calcified, with moderately reduced leaflet mobility.   Stroke volume index is 38 mL/m2 despite reduced ejection   fraction. Mean systolic gradient through AV is 28 mmHg. Valve   area is subject to error from LVOT diameter and LVOT TVI,   calculates to approximately 1 cm2.     Gradient was interrogated from multiple windows with apical  window producing highest gradient.  Patient Profile     84 y.o. male with AKI on CKD, acute decompensated diastolic CHF, PAF, PAD s/p amputation and anemia, admitted 12/24 for CHF.  Assessment & Plan    1.  Anemia -FOB + and has received several units of blood -Hbg down to 7.4 yesterday but 8.2 this morning after PRBCs -per TRH/GI  2.  Afib with RVR - HR increased to the 130s while patient sitting up - Now on Amio at 30mg /hr for better rate control and Lopressor 25mg  BID -  Will continue IV amiodarone drip and give a bolus of 150 mg IV now to try to get heart rate under better control  3.  AS -May be more severe than previously thought.  -repeat echo showed worsening LVF with EF 30-35% with diffuse HK and moderate to severe AS.  ? Tachy induced CM -mean gradient 52mmHg but likely underestimated due to LV dysfunction  -VTI ratio 0.23 c/w severe AS  4.  Acute on chronic combined systolic and diastolic CHF - Diuresis improved yesterday after increasing Lasix.  He put out 1.19 L yesterday and is net -3.585 L - wt is down 4 pounds from yesterday and 12 pounds from admission - cxray with interstitial edema on 12/26 - Continue Lasix  80mg  IV TID - Creatinine slowly improving with diuresis (3.44>3.23)  5.  Chronic anticoag - INR 2.31 today, per Pharmacy  6.  CKD IV/V -Renal following -Cr improved from admission but still very high  7.  Hypokalemia  -Potassium 3.3 this morning due to diuresis -Replete to keep potassium greater than 4  I have spent a total of 35 minutes with patient reviewing telemetry, EKGs, labs and examining patient as well as establishing an assessment and plan that was discussed with the patient.  > 50% of time was spent in direct patient care.    For questions or updates, please contact [place] Please consult [email or web address] for contact info under Cardiology/STEMI.      Signed, Fransico Him, MD  01/08/2018, 10:07 AM

## 2018-01-08 NOTE — Progress Notes (Signed)
PROGRESS NOTE    Greg Booker  LPF:[id number] DOB: 11/25/1929 DOA: 01/03/2018 PCP: Greg Mina., MD   Brief Narrative:  HPI on 01/03/2018 by Ms. Greg Sims, NP (cardiology) Greg Booker is a 84 y.o. male who presents for ongoing assessment and management of CAD with most recent cath in 2006 revealed an occluded RCA with collaterals. Hx of DVT on coumadin  therapy.She is followed in our coumadin clinic and was last seen by them on 12/01/2017.   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 Had cardiac cath at [facility] on 12/12/2017 requiring DES to the RCA. Now on Plavix.  He was last seem by Dr. Debara Booker on 10/17/2017 for and had recent echocardiogram revealing a LVEF of 60%-65% and moderate Aortic stenosis. He had been admitted for acute renal failure which did not require dialysis. He was scheduled for a fistula placement.   He comes today clearly fluid overloaded with 2+3+ pitting edema in the right leg, having gained over 10 lbs. He was advised to go to ER yesterday but wanted to wait for this appointment. He is dyspneic, falls asleep easily. His skin is spilling open and weeping on the lower right extremity.   He has been a patient of [facility] for rehab and has not been adhering to low sodium diet. He has been having worsening cough and congestion.    Interim history TRH assumed care of this patient as he had multiple medical problems.  Patient was admitted by cardiology for volume overload/CHF.  Assessment & Plan   Acute kidney injury on chronic kidney disease, stage IV -Patient's baseline creatinine around 2.5, however on admission was 4.07 -Nephrology consulted and appreciated, may have urinary tract infection, interstitial nephritis.  Does not feel patient would be a hemodialysis candidate given his hypotension. -Continue to monitor BMP closely as patient will be getting IV  diuresis -Creatinine currently 3.44 -Renal ultrasound showed no evidence of hydronephrosis.    Acute on chronic combined CHF -Echocardiogram August 2019 showed an EF of 45 to 50%, diffuse hypokinesis, grade 2 diastolic dysfunction -BNP 1394 -Patient currently on IV Lasix 60 mg 3 times daily -Monitor intake and output, daily weights-urine output 1400cc over the past 24 hours -Cardiology following -Echocardiogram EF 30-35%, diffuse hypokinesis.   Chronic anemia -Likely multifactorial including chronic kidney disease, volume overload with CHF -hemoglobin on admission was 7, given 2u PRBCs -Upon review of patient's chart, hemoglobin was approximately 9-10 in 2018 (per care everywhere) -Hemoglobin 8.2 today -Anemia panel showed adequate iron -Nephrology starting darbepoetin -FOBT + -Reviewed patient's chart via care everywhere, it seems that he has been following up with gastroenterology Greg Booker, note from 11/22/2017 noted that patient and daughter deferred a small bowel capsule endoscopy as patient's hemoglobin was stable and no bleeding was noted. -He also has had a EGD showing mild nonspecific gastropathy.  Colonoscopy showed a 7 mm sessile sigmoid polyp which was removed, grade 2 internal hemorrhoids. -Discussed with gastroenterology, unfortunately capsule endoscopy cannot be done or read this weekend, they recommended outpatient capsule endoscopy. -Had a very long and extensive discussion with patient and caregiver at bedside.  Feel that capsule endoscopy may not be needed-if bleeding was to be found, patient would likely need to discontinue his Coumadin however he is a very high risk of stroke and blood clots if he discontinues his Coumadin. -Caregiver tells me that  patient has been receiving occasional blood transfusions from his hematology oncology doctor in [place].  Aortic stenosis -Echocardiogram showed moderate to moderate severe, valve severely calcified with  mildly reduced leaflet mobility.  Mean systolic gradient through valve is 28 mmHg -Cardiology following  Chronic atrial fibrillation -Continue Coumadin, metoprolol -currently on amiodarone drip per cardiology  Coronary artery disease -Patient had catheterization earlier this month with drug-eluting stent placed in the RCA. -Currently no complaints of chest pain -Continue Plavix and management per cardiology  Diabetes mellitus, type II -Continue insulin sliding scale and CBG monitoring  Right lateral heel decubitus ulcer -Wound care consulted  History of prostate cancer -Status post radioactive seed placement -continue casodex -? Source of anemia in conjunction with coumadin  -Continue to follow-up with oncology upon discharge  Goals of care -Had long discussion with caregiver as well as patient, will consult palliative care to discuss further goals of care as well as possible hospice and CODE STATUS.  DVT Prophylaxis  Coumadin  Code Status: Full  Family Communication: None at bedside  Disposition Plan: Admitted. Pending cardiology and nephrology recommendations for CHF and CKD. Dispo pending (patient would like to go home with 24/7 care)  Consultants Nephrology Cardiology Gastroenterology Palliative care  Procedures  Echcoardiogram  Antibiotics   Anti-infectives (From admission, onward)   None      Subjective:   Greg Booker seen and examined today.  Feeling much better today getting blood.  Feels his breathing is improved.  Caregiver also feels his color has also improved.  Denies current chest pain, abdominal pain, nausea or vomiting, diarrhea constipation, dizziness or headache.  Would like to go home. Objective:   Vitals:   01/07/18 1525 01/07/18 1901 01/07/18 2128 01/08/18 0548  BP: (!) 87/58 (!) 87/68 100/65 122/85  Pulse: 98 (!) 116 (!) 111 (!) 109  Resp: 15  18 18   Temp: (!) 97.5 F (36.4 C) 98 F (36.7 C) 98.2 F (36.8 C) 98 F (36.7 C)    TempSrc: Axillary Oral Oral Oral  SpO2: 100%  100% 100%  Weight:    72.5 kg  Height:        Intake/Output Summary (Last 24 hours) at 01/08/2018 1322 Last data filed at 01/08/2018 1101 Gross per 24 hour  Intake 1480.91 ml  Output 2000 ml  Net -519.09 ml   Filed Weights   01/05/18 0603 01/07/18 0600 01/08/18 0548  Weight: 71.3 kg 74.1 kg 72.5 kg   Exam  General: Well developed, chronically ill-appearing, NAD  HEENT: NCAT,  mucous membranes moist.   Neck: Supple  Cardiovascular: S1 S2 auscultated,3/6 SEM, irregular  Respiratory: Clear to auscultation bilaterally   Abdomen: Soft, nontender, nondistended, + bowel sounds  Extremities: Left AKA.  Right lower extremity edema improved, sitting in place, venous stasis skin changes  Neuro: AAOx3, nonfocal  Psych: Pleasant, appropriate mood and affect  Data Reviewed: I have personally reviewed following labs and imaging studies  CBC: Recent Labs  Lab 01/03/18 1303  01/05/18 0326 01/06/18 0445 01/07/18 0336 01/07/18 2100 01/08/18 0457  WBC 12.5*  --  9.1  --  8.2  --   --   NEUTROABS 10.6*  --   --   --   --   --   --   HGB 7.0*   < > 8.7* 7.4* 7.4* 8.5* 8.2*  HCT 21.9*   < > 24.7* 21.5* 23.1* 25.7* 24.8*  MCV 102.8*  --  100.0  --  103.1*  --   --  PLT 282  --  247  --  267  --   --    < > = values in this interval not displayed.   Basic Metabolic Panel: Recent Labs  Lab 01/04/18 0402 01/05/18 0326 01/06/18 0445 01/07/18 0336 01/08/18 0457  NA 140 136 141 138 137  K 3.6 3.1* 4.5 3.7 3.3*  CL 100 99 103 99 98  CO2 26 24 22 24 25   GLUCOSE 119* 112* 147* 135* 138*  BUN 64* 72* 83* 80* 74*  CREATININE 3.95* 3.79* 3.67* 3.44* 3.23*  CALCIUM 8.5* 8.5* 8.7* 8.9 8.7*   GFR: Estimated Creatinine Clearance: 16.4 mL/min (A) (by C-G formula based on SCr of 3.23 mg/dL (H)). Liver Function Tests: Recent Labs  Lab 01/03/18 1303 01/07/18 0430  AST 19 23  ALT 14 12  ALKPHOS 50 49  BILITOT 0.8 0.6  PROT 6.7  6.3*  ALBUMIN 2.9* 2.8*   No results for input(s): LIPASE, AMYLASE in the last 168 hours. No results for input(s): AMMONIA in the last 168 hours. Coagulation Profile: Recent Labs  Lab 01/04/18 0402 01/05/18 0326 01/06/18 0445 01/07/18 0336 01/08/18 0457  INR 1.97 1.89 1.90 2.02 2.31   Cardiac Enzymes: No results for input(s): CKTOTAL, CKMB, CKMBINDEX, TROPONINI in the last 168 hours. BNP (last 3 results) No results for input(s): PROBNP in the last 8760 hours. HbA1C: No results for input(s): HGBA1C in the last 72 hours. CBG: Recent Labs  Lab 01/07/18 1107 01/07/18 1614 01/07/18 2126 01/08/18 0734 01/08/18 1107  GLUCAP 124* 135* 141* 137* 142*   Lipid Profile: No results for input(s): CHOL, HDL, LDLCALC, TRIG, CHOLHDL, LDLDIRECT in the last 72 hours. Thyroid Function Tests: Recent Labs    01/07/18 0336  TSH 4.817*  FREET4 1.04   Anemia Panel: No results for input(s): VITAMINB12, FOLATE, FERRITIN, TIBC, IRON, RETICCTPCT in the last 72 hours. Urine analysis:    Component Value Date/Time   COLORURINE YELLOW 01/03/2018 1603   APPEARANCEUR CLEAR 01/03/2018 1603   LABSPEC 1.011 01/03/2018 1603   PHURINE 6.0 01/03/2018 1603   GLUCOSEU NEGATIVE 01/03/2018 1603   HGBUR MODERATE (A) 01/03/2018 1603   BILIRUBINUR NEGATIVE 01/03/2018 1603   KETONESUR NEGATIVE 01/03/2018 1603   PROTEINUR NEGATIVE 01/03/2018 1603   UROBILINOGEN 0.2 10/29/2011 0945   NITRITE NEGATIVE 01/03/2018 1603   LEUKOCYTESUR TRACE (A) 01/03/2018 1603   Sepsis Labs: @LABRCNTIP (procalcitonin:4,lacticidven:4)  ) Recent Results (from the past 240 hour(s))  Urine Culture     Status: None   Collection Time: 01/05/18  2:22 AM  Result Value Ref Range Status   Specimen Description URINE, CATHETERIZED  Final   Special Requests Normal  Final   Culture   Final    NO GROWTH Performed at [facility], [place] [address], [place], [place] [address]    Report Status 01/06/2018 FINAL  Final       Radiology Studies: No results found.   Scheduled Meds: . B-complex with vitamin C  1 tablet Oral Daily  . bicalutamide  50 mg Oral Daily  . clopidogrel  75 mg Oral Daily  . darbepoetin (ARANESP) injection - NON-DIALYSIS  200 mcg Subcutaneous Q Wed-1800  . dextromethorphan-guaiFENesin  1 tablet Oral BID  . furosemide  80 mg Intravenous Q8H  . gabapentin  100 mg Oral QHS  . insulin aspart  0-9 Units Subcutaneous TID WC  . metoprolol tartrate  25 mg Oral BID  . multivitamin with minerals  1 tablet Oral Daily  . pantoprazole  40  mg Oral QAC breakfast  . senna  2 tablet Oral QHS  . sodium chloride flush  3 mL Intravenous Q12H  . tamsulosin  0.4 mg Oral QPC supper  . warfarin  2 mg Oral ONCE-1800  . Warfarin - Pharmacist Dosing Inpatient   Does not apply q1800   Continuous Infusions: . sodium chloride    . amiodarone 60 mg/hr (01/08/18 1202)   Followed by  . amiodarone       LOS: 5 days   Time Spent in minutes   45 minutes (greater than 50% of time spent with patient and caregiver face to face, as well as reviewing records, discussing code status and goals of care, and formulating a plan)  Cristal Ford D.O. on 01/08/2018 at 1:22 PM  Between 7am to 7pm - Please see pager noted on [email or web address]  After 7pm go to [email or web address]  And look for the night coverage person covering for me after hours  [facility] Office  [phone]

## 2018-01-08 NOTE — Progress Notes (Signed)
ANTICOAGULATION CONSULT NOTE - Follow Up Consult  Pharmacy Consult for Coumadin Indication: atrial fibrillation  Allergies  Allergen Reactions  . Bicalutamide Other (See Comments)    Casodex caused increase in breast size 01/05/18: confirmed still taking this medication  . Codeine Itching and Rash  . Niacin And Related Other (See Comments)    Burning and stinging from tablets (otc capsules ok)  . Lovastatin Other (See Comments)    Leg weakness  . Morphine Other (See Comments)    "Allergic," per MAR  . Beef Extract Rash  . Pravastatin Other (See Comments)    Myalgia    Patient Measurements: Height: 5\' 9"  (175.3 cm) Weight: 159 lb 13.3 oz (72.5 kg) IBW/kg (Calculated) : 70.7  Vital Signs: Temp: 98 F (36.7 C) (12/29 0548) Temp Source: Oral (12/29 0548) BP: 122/85 (12/29 0548) Pulse Rate: 109 (12/29 0548)  Labs: Recent Labs    01/06/18 0445 01/07/18 0336 01/07/18 2100 01/08/18 0457  HGB 7.4* 7.4* 8.5* 8.2*  HCT 21.5* 23.1* 25.7* 24.8*  PLT  --  267  --   --   LABPROT 21.6* 22.6*  --  25.1*  INR 1.90 2.02  --  2.31  CREATININE 3.67* 3.44*  --  3.23*    Estimated Creatinine Clearance: 16.4 mL/min (A) (by C-G formula based on SCr of 3.23 mg/dL (H)).  Assessment:  84 yr old male admitted 12/24 with fluid overload.  To continue Coumadin as prior to admission for atrial fibrillation and hx DVT (2013?).     Caregiver  reported 12/24 most recent Coumadin regimen is 2.5 mg daily except 5 mg on Tuesdays.  She also reports that Coumadin has been held x 2 days d/t INR 4.1 on 12/22 and 3.8 on 12/23 at SNF. INR 2.8 at Cardiology office on 12/24.  Hx anemia. Aranesp 200 mg SQ weekly begun on 12/25.   INR remains therapeutic today at 2.31. Of note, FOB positive on 12/25. Hgb 8.2, plt 267- stable s/p 2 PRBCs on 12/28. Noted discussion of risk/benefit of Coumadin, but to continue along with Plavix. Also on amiodarone which was started on 12/26, anticipate delayed impact on INR  sensitivity.   No further nose bleeds per nursing- stabilized.   Goal of Therapy:  INR 2-3 Monitor platelets by anticoagulation protocol: Yes   Plan:  Coumadin 2 mg today. Daily PT/INR.  Watch for sensitivity to Coumadin with addition of amiodarone. Follow up FOB checks, monitor for any bleeding.  Antonietta Jewel, PharmD, [facility] Clinical Pharmacist  Pager: [phone] Phone: [phone] 01/08/2018,10:31 AM

## 2018-01-09 DIAGNOSIS — Z515 Encounter for palliative care: Secondary | ICD-10-CM

## 2018-01-09 DIAGNOSIS — Z7189 Other specified counseling: Secondary | ICD-10-CM

## 2018-01-09 DIAGNOSIS — N179 Acute kidney failure, unspecified: Principal | ICD-10-CM

## 2018-01-09 LAB — GLUCOSE, CAPILLARY
GLUCOSE-CAPILLARY: 153 mg/dL — AB (ref 70–99)
Glucose-Capillary: 135 mg/dL — ABNORMAL HIGH (ref 70–99)
Glucose-Capillary: 114 mg/dL — ABNORMAL HIGH (ref 70–99)
Glucose-Capillary: 145 mg/dL — ABNORMAL HIGH (ref 70–99)

## 2018-01-09 LAB — HEMOGLOBIN AND HEMATOCRIT, BLOOD
HCT: 25.4 % — ABNORMAL LOW (ref 39.0–52.0)
Hemoglobin: 8.4 g/dL — ABNORMAL LOW (ref 13.0–17.0)

## 2018-01-09 LAB — BASIC METABOLIC PANEL
Anion gap: 13 (ref 5–15)
BUN: 73 mg/dL — ABNORMAL HIGH (ref 8–23)
CO2: 26 mmol/L (ref 22–32)
Calcium: 8.8 mg/dL — ABNORMAL LOW (ref 8.9–10.3)
Chloride: 97 mmol/L — ABNORMAL LOW (ref 98–111)
Creatinine, Ser: 3.38 mg/dL — ABNORMAL HIGH (ref 0.61–1.24)
GFR calc Af Amer: 18 mL/min — ABNORMAL LOW (ref >60)
GFR calc non Af Amer: 16 mL/min — ABNORMAL LOW (ref >60)
Glucose, Bld: 144 mg/dL — ABNORMAL HIGH (ref 70–99)
Potassium: 3.5 mmol/L (ref 3.5–5.1)
Sodium: 136 mmol/L (ref 135–145)

## 2018-01-09 LAB — PROTIME-INR
INR: 2.38
Prothrombin Time: 25.7 seconds — ABNORMAL HIGH (ref 11.4–15.2)

## 2018-01-09 MED ORDER — FUROSEMIDE 80 MG PO TABS
80.0000 mg | ORAL_TABLET | Freq: Two times a day (BID) | ORAL | Status: DC
  Administered 2018-01-09 – 2018-01-10 (×2): 80 mg via ORAL
  Filled 2018-01-09 (×2): qty 1

## 2018-01-09 MED ORDER — METOPROLOL TARTRATE 50 MG PO TABS
50.0000 mg | ORAL_TABLET | Freq: Two times a day (BID) | ORAL | Status: DC
  Administered 2018-01-09 – 2018-01-10 (×2): 50 mg via ORAL
  Filled 2018-01-09 (×2): qty 1

## 2018-01-09 MED ORDER — AMIODARONE HCL 200 MG PO TABS
200.0000 mg | ORAL_TABLET | Freq: Two times a day (BID) | ORAL | Status: DC
  Administered 2018-01-09 – 2018-01-10 (×3): 200 mg via ORAL
  Filled 2018-01-09 (×3): qty 1

## 2018-01-09 MED ORDER — WARFARIN SODIUM 2.5 MG PO TABS
2.5000 mg | ORAL_TABLET | Freq: Once | ORAL | Status: AC
  Administered 2018-01-09: 2.5 mg via ORAL
  Filled 2018-01-09: qty 1

## 2018-01-09 NOTE — Progress Notes (Addendum)
Daily Progress Note   Patient Name: Greg Booker       Date: 01/09/2018 DOB: 06-21-1929  Age: 84 y.o. MRN#: [id number] Attending Physician: Cristal Ford, DO Primary Care Physician: Raina Mina., MD Admit Date: 01/03/2018  Reason for Consultation/Follow-up: Establishing goals of care  Subjective: Patient awake, alert, oriented. Tells me he is ready to go home. Denies pain or discomfort. Feeling better today.   GOC:   Caregiver, Edythe Clarity, at bedside. Introduced role of palliative medicine again and follow-up from initial discussion with Vinie Sill, NP.   Patient is widowed. He has a brother Adult nurse) and sister (visiting from [place]). [name] is his caregiver. She speaks of plan not to return to SNF for rehab but to take [name] home with support from caregivers following hospitalization.   Discussed events leading up to hospitalization and course of hospital diagnoses and interventions. Discussed challenges with managing heart conditions with worsening kidney functions. Discussed poor candidacy for hemodialysis.    I attempted to elicit values and goals of care important to the patient. Advanced directives, concepts specific to code status, artifical feeding and hydration, and rehospitalization were considered and discussed. Patient and caregiver are ready to complete AD packet today with chaplain and notary. Further discussed AD packet. Introduced MOST form and discussed in detail with patient and caregiver.   After extensive discussion, patient wishes including DNR/DNI, limited interventions including CPAP/BiPAP if indicated, IVF/ABX if indicated, and NO feeding tube. Durable DNR completed. Copies made for patient and family.   Patient requests his brother, Greg Booker, to be  HCPOA as well as [name] as Passenger transport manager. Chaplain at bedside during my visit and completing AD packet with caregiver and family.   After completion of both these documents, patient overwhelmed and anxious. PMT NP will further discuss outpatient palliative versus hospice with patient/caregiver at a different time. He needs time to process conversation today.   Also, friends arrive to visit with patient at bedside.   PMT contact information given to Edythe Clarity and encouraged her to call with questions and concerns.    Length of Stay: 6  Current Medications: Scheduled Meds:  . B-complex with vitamin C  1 tablet Oral Daily  . bicalutamide  50 mg Oral Daily  . clopidogrel  75 mg Oral Daily  . darbepoetin (ARANESP) injection -  NON-DIALYSIS  200 mcg Subcutaneous Q Wed-1800  . dextromethorphan-guaiFENesin  1 tablet Oral BID  . furosemide  80 mg Intravenous Q8H  . gabapentin  100 mg Oral QHS  . insulin aspart  0-9 Units Subcutaneous TID WC  . metoprolol tartrate  25 mg Oral BID  . multivitamin with minerals  1 tablet Oral Daily  . pantoprazole  40 mg Oral QAC breakfast  . senna  2 tablet Oral QHS  . sodium chloride flush  3 mL Intravenous Q12H  . tamsulosin  0.4 mg Oral QPC supper  . warfarin  2.5 mg Oral ONCE-1800  . Warfarin - Pharmacist Dosing Inpatient   Does not apply q1800    Continuous Infusions: . sodium chloride    . amiodarone 30 mg/hr (01/09/18 0628)    PRN Meds: sodium chloride, acetaminophen, albuterol, ALPRAZolam, alum & mag hydroxide-simeth, metoprolol tartrate, ondansetron (ZOFRAN) IV, sodium chloride flush  Physical Exam Vitals signs and nursing note reviewed.  Constitutional:      General: He is awake.  HENT:     Head: Normocephalic and atraumatic.  Cardiovascular:     Rate and Rhythm: Rhythm irregularly irregular.  Pulmonary:     Effort: No tachypnea, accessory muscle usage or respiratory distress.  Abdominal:     Tenderness: There is no abdominal  tenderness.  Neurological:     Mental Status: He is alert and oriented to person, place, and time.  Psychiatric:        Attention and Perception: Attention normal.        Mood and Affect: Mood normal.        Speech: Speech normal.        Behavior: Behavior is cooperative.        Cognition and Memory: Cognition normal.            Vital Signs: BP (!) 141/89 (BP Location: Right Wrist)   Pulse (!) 119   Temp 98 F (36.7 C) (Oral)   Resp 18   Ht 5\' 9"  (1.753 m)   Wt 69.1 kg   SpO2 98%   BMI 22.50 kg/m  SpO2: SpO2: 98 % O2 Device: O2 Device: Room Air O2 Flow Rate:    Intake/output summary:   Intake/Output Summary (Last 24 hours) at 01/09/2018 1143 Last data filed at 01/09/2018 0500 Gross per 24 hour  Intake 981.16 ml  Output 850 ml  Net 131.16 ml   LBM: Last BM Date: 01/07/18 Baseline Weight: Weight: 77.6 kg Most recent weight: Weight: 69.1 kg       Palliative Assessment/Data: PPS 40%   Flowsheet Rows     Most Recent Value  Intake Tab  Referral Department  Hospitalist  Unit at Time of Referral  Cardiac/Telemetry Unit  Palliative Care Primary Diagnosis  Cardiac  Date Notified  01/03/18  Palliative Care Type  New Palliative care  Reason for referral  Clarify Goals of Care  Date of Admission  01/03/18  Date first seen by Palliative Care  01/05/18  # of days Palliative referral response time  2 Day(s)  # of days IP prior to Palliative referral  0  Clinical Assessment  Psychosocial & Spiritual Assessment  Palliative Care Outcomes      Patient Active Problem List   Diagnosis Date Noted  . Acute renal failure superimposed on stage 4 chronic kidney disease ([place])   . Goals of care, counseling/discussion   . Palliative care encounter   . Anemia 01/04/2018  . Pressure injury of skin 01/04/2018  .  Acute on chronic systolic heart failure ([place]) 01/03/2018  . Status post below knee amputation of left lower extremity 09/02/2017  . Diastolic CHF, chronic ([place])  02/22/2017  . Permanent atrial fibrillation 02/22/2017  . On anticoagulant therapy 08/09/2016  . Diabetic neuropathy ([place]) 08/09/2016  . Hypertensive heart and chronic kidney disease 08/09/2016  . Osteoarthritis 08/09/2016  . Overweight 08/09/2016  . Vitamin D deficiency 08/09/2016  . Coronary atherosclerosis 07/19/2016  . Bilateral carotid bruits 07/13/2016  . Acute on chronic combined systolic and diastolic CHF (congestive heart failure) ([place]) 06/29/2016  . Acute congestive heart failure ([place]) 04/02/2016  . Aortic valve stenosis 04/02/2016  . Shortness of breath 04/02/2016  . Atheroscler of native artery of both legs with intermit claudication ([place]) 04/01/2016  . Status post partial amputation of foot, left ([place]) 04/01/2016  . Dyslipidemia 01/21/2016  . PVD (peripheral vascular disease) with claudication ([place]) 01/21/2016  . CAD (coronary artery disease) 01/21/2016  . Diabetic ulcer of left midfoot associated with diabetes mellitus due to underlying condition, with fat layer exposed ([place]) 01/14/2016  . Gram-negative bacteremia 12/09/2015  . AKI (acute kidney injury) ([place]) 12/08/2015  . Diabetes mellitus ([place]) 12/08/2015  . Pulmonary edema 12/08/2015  . Sepsis ([place]) 12/08/2015  . ST segment depression 12/08/2015  . Toe amputation status, left 12/08/2015  . Troponin level elevated 12/08/2015  . Hyperlipidemia 11/04/2015  . Atherosclerosis of native artery of left lower extremity with rest pain ([place]) 10/28/2015  . Diabetic peripheral vascular disease ([place]) 10/24/2015  . Chest pain with moderate risk of acute coronary syndrome 10/07/2015  . Recurrent falls 06/12/2015  . Imbalance 06/12/2015  . Hypertensive heart disease without heart failure 06/12/2015  . Hypertensive heart disease   . Falls   . Hallux rigidus of both feet 07/04/2014  . Plantar fasciitis, bilateral 07/04/2014  . Avascular necrosis of bone of right hip ([place]) 06/28/2013  . S/P total hip arthroplasty 06/26/2013  .  Lumbosacral spondylosis without myelopathy 04/05/2013  . Spondylosis of lumbar joint 04/05/2013  . bicalutamide-induced gynecomastia 10/17/2012  . Hypokalemia 11/04/2011  . CAD S/P percutaneous coronary angioplasty 11/03/2011  . Hypertension 11/03/2011  . CKD (chronic kidney disease) 11/03/2011  . Avascular necrosis of femoral head, s/p [facility] 10/28/2011  . Malignant tumor of prostate ([place]) 03/01/2011    Palliative Care Assessment & Plan   Patient Profile: 84 y.o. male  with past medical history of diastolic CHF, HTN, CAD, HLD, mitral regurgitation, atrial fibrillation (on Coumadin), diabetes 2, CKD stage 3-4, PAD, h/o prostate cancer, h/o left foot 3 toes amputated admitted on 01/03/2018 from SNF (rehab s/p toe amputation) with weight gain, cough, congestion r/t CHF exacerbation and acute on chronic kidney disease. Also with anemia of chronic disease and new right heel stage 3 decubitus ulcer.   Assessment: Acute on chronic CHF AKI on CKD stage IV Aortic stenosis Chronic atrial fibrillation CAD Chronic anemia DM type 2  Recommendations/Plan:  MOST form completed with patient and caregiver Edythe Clarity). Patient wishes including: DNR/DNI, limited interventions including CPAP/BiPAP if indicated, ABX/IVF if indicated, NO feeding tube. Durable DNR completed. Copies made for family and chart.   Chaplain at bedside completing AD packet with patient and caregiver. Notary will come to bedside this afternoon.   Patient overwhelmed after documents completed. Will further discuss outpatient palliative/hospice options with patient/caregiver tomorrow.    Code Status: DNR/DNI   Code Status Orders  (From admission, onward)         Start     Ordered  01/03/18 1209  Full code  Continuous     01/03/18 1220        Code Status History    Date Active Date Inactive Code Status Order ID Comments User Context   06/26/2013 1142 06/28/2013 1439 Full Code [id number]  Garald Balding, MD  Inpatient   04/05/2013 1137 04/05/2013 2352 Full Code [id number]  Charlie Pitter, MD Inpatient   11/02/2011 1353 11/04/2011 1737 Full Code [id number]  Myrtie Hawk, RN Inpatient       Prognosis:   Unable to determine: Poor long-term prognosis with acute on chronic heart failure, AKI with underlying chronic kidney disease poor dialysis candidate, aortic stenosis, chronic afib, declining functional status. High risk for recurrent hospitalization.   Discharge Planning:  Home with [place] was discussed with patient, caregiver, RN, chaplain, Dr. Ree Kida  Thank you for allowing the Palliative Medicine Team to assist in the care of this patient.   Time In: 1045 Time Out: 1200 Total Time 75 Prolonged Time Billed  yes      Greater than 50%  of this time was spent counseling and coordinating care related to the above assessment and plan.  Ihor Dow, FNP-C Palliative Medicine Team  Phone: [phone] Fax: [phone]  Please contact Palliative Medicine Team phone at [phone] for questions and concerns.

## 2018-01-09 NOTE — Progress Notes (Signed)
   01/09/18 1205  Clinical Encounter Type  Visited With Patient and family together  Visit Type Initial;Other (Comment) (AD)  Referral From Palliative care team  Consult/Referral To Chaplain  The chaplain responded to PMT NP-MM to complete Pt. AD.  The Pt. caregiver [name] and Pt. sister were present for education and completing the document.  The Pt. had no questions at this time. The chaplain left the AD with the Pt. for notarizing in the Pt. room after 1pm on Monday.  The chaplain will F/U with spiritual care as needed.

## 2018-01-09 NOTE — Progress Notes (Signed)
Progress Note  Patient Name: Greg Booker Date of Encounter: 01/09/2018  Primary Cardiologist: Pixie Casino, MD   Subjective   Pt feeling better today. Wants to go home however does not appear to be ready from a cardiology perspective  Inpatient Medications    Scheduled Meds: . B-complex with vitamin C  1 tablet Oral Daily  . bicalutamide  50 mg Oral Daily  . clopidogrel  75 mg Oral Daily  . darbepoetin (ARANESP) injection - NON-DIALYSIS  200 mcg Subcutaneous Q Wed-1800  . dextromethorphan-guaiFENesin  1 tablet Oral BID  . furosemide  80 mg Intravenous Q8H  . gabapentin  100 mg Oral QHS  . insulin aspart  0-9 Units Subcutaneous TID WC  . metoprolol tartrate  25 mg Oral BID  . multivitamin with minerals  1 tablet Oral Daily  . pantoprazole  40 mg Oral QAC breakfast  . senna  2 tablet Oral QHS  . sodium chloride flush  3 mL Intravenous Q12H  . tamsulosin  0.4 mg Oral QPC supper  . warfarin  2.5 mg Oral ONCE-1800  . Warfarin - Pharmacist Dosing Inpatient   Does not apply q1800   Continuous Infusions: . sodium chloride    . amiodarone 30 mg/hr (01/09/18 0628)   PRN Meds: sodium chloride, acetaminophen, albuterol, ALPRAZolam, alum & mag hydroxide-simeth, metoprolol tartrate, ondansetron (ZOFRAN) IV, sodium chloride flush   Vital Signs    Vitals:   01/08/18 1419 01/08/18 1924 01/09/18 0409 01/09/18 0729  BP: 112/66 97/72 96/62  (!) 141/89  Pulse: 80 (!) 119 78 (!) 119  Resp: (!) 21 (!) 21 18   Temp: 98.2 F (36.8 C) 98.2 F (36.8 C) 97.6 F (36.4 C) 98 F (36.7 C)  TempSrc: Oral Oral Oral Oral  SpO2: 100% 97% 100% 98%  Weight:   69.1 kg   Height:        Intake/Output Summary (Last 24 hours) at 01/09/2018 1308 Last data filed at 01/09/2018 0500 Gross per 24 hour  Intake 605.89 ml  Output 850 ml  Net -244.11 ml   Filed Weights   01/07/18 0600 01/08/18 0548 01/09/18 0409  Weight: 74.1 kg 72.5 kg 69.1 kg    Physical Exam   General: Elderly,  NAD Skin: Warm, dry, intact  Head: Normocephalic, atraumatic, clear, moist mucus membranes. Neck: Negative for carotid bruits. No JVD Lungs: Diminished bilaterally. No wheezes, rales, or rhonchi. Breathing is unlabored. Cardiovascular: Irregularly irregular with S1 S2. No murmurs, rubs, gallops, or LV heave appreciated. Abdomen: Soft, non-tender, non-distended with normoactive bowel sounds. No obvious abdominal masses. MSK: Strength and tone appear normal for age. 5/5 in all extremities.  Neuro: Alert and oriented. No focal deficits. No facial asymmetry. MAE spontaneously. Psych: Responds to questions appropriately with normal affect.    Labs    Chemistry Recent Labs  Lab 01/03/18 1303  01/07/18 0336 01/07/18 0430 01/08/18 0457 01/09/18 0352  NA 137   < > 138  --  137 136  K 3.9   < > 3.7  --  3.3* 3.5  CL 99   < > 99  --  98 97*  CO2 25   < > 24  --  25 26  GLUCOSE 129*   < > 135*  --  138* 144*  BUN 64*   < > 80*  --  74* 73*  CREATININE 4.07*   < > 3.44*  --  3.23* 3.38*  CALCIUM 8.9   < > 8.9  --  8.7* 8.8*  PROT 6.7  --   --  6.3*  --   --   ALBUMIN 2.9*  --   --  2.8*  --   --   AST 19  --   --  23  --   --   ALT 14  --   --  12  --   --   ALKPHOS 50  --   --  49  --   --   BILITOT 0.8  --   --  0.6  --   --   GFRNONAA 12*   < > 15*  --  16* 16*  GFRAA 14*   < > 18*  --  19* 18*  ANIONGAP 13   < > 15  --  14 13   < > = values in this interval not displayed.     Hematology Recent Labs  Lab 01/03/18 1303  01/05/18 0326  01/07/18 0336 01/07/18 2100 01/08/18 0457 01/09/18 0352  WBC 12.5*  --  9.1  --  8.2  --   --   --   RBC 2.13*  --  2.47*  --  2.24*  --   --   --   HGB 7.0*   < > 8.7*   < > 7.4* 8.5* 8.2* 8.4*  HCT 21.9*   < > 24.7*   < > 23.1* 25.7* 24.8* 25.4*  MCV 102.8*  --  100.0  --  103.1*  --   --   --   MCH 32.9  --  35.2*  --  33.0  --   --   --   MCHC 32.0  --  35.2  --  32.0  --   --   --   RDW 18.2*  --  19.4*  --  19.5*  --   --   --   PLT  282  --  247  --  267  --   --   --    < > = values in this interval not displayed.    Cardiac EnzymesNo results for input(s): TROPONINI in the last 168 hours. No results for input(s): TROPIPOC in the last 168 hours.   BNP Recent Labs  Lab 01/03/18 1303  BNP 1,394.2*     DDimer No results for input(s): DDIMER in the last 168 hours.   Radiology    No results found.  Telemetry    01/09/18 AF with HR 110-120's - Personally Reviewed  ECG    No new tracing as of 01/09/18- Personally Reviewed  Cardiac Studies   Echocardiogram: Study Conclusions  - Left ventricle: The cavity size was normal. Systolic function was moderately to severely reduced. The estimated ejection fraction was in the range of 30% to 35%. Diffuse hypokinesis. Stroke volume: 71 ml. Stroke volume/bsa: 38 ml/m^2. - Aortic valve: Valve mobility was moderately restricted. There was trivial regurgitation. Mean gradient (S): 28 mm Hg. Valve area (Vmax): 1 cm^2. - Mitral valve: Calcified annulus. There was trivial regurgitation. - Left atrium: The atrium was moderately dilated. - Right ventricle: Systolic function was mildly to moderately reduced.  Impressions:  - Exam performed in atrial fibrillation with RVR. LVEF is 30-35%. RV systolic function is mild-moderately reduced.  Aortic stenosis is likely moderate to moderate-severe. The valve is severely calcified, with moderately reduced leaflet mobility. Stroke volume index is 38 mL/m2 despite reduced ejection fraction. Mean systolic gradient through AV is 28 mmHg. Valve area is subject to error from  LVOT diameter and LVOT TVI, calculates to approximately 1 cm2.  Gradient was interrogated from multiple windows with apical window producing highest gradient.  Patient Profile     84 y.o. male with AKI on CKD, acute decompensated diastolic CHF, PAF, PAD s/p amputation and anemia, admitted12/4for CHF.  Assessment &  Plan    1. AFib with RVR: -HR improved today with rates in the 110-120's however continues to be elevated -Currently on IV Amio 30mg /hr>>>consider transitioning to PO Amiodarone 200mg  twice daily  -Will increase metoprolol to 50mg  twice daily  -Chronic anticoagulation with coumadin per pharmacy  -INR, 2.38 today   2. Acute on chronic systolic and diastolic CHF: -Lasix increased to 80mg  TID with improvement  -Creatinine 3.38 today, slightly up from 3.23 yesterday>>consider decreasing Lasix dose to 80mg  twice daily  -Weight, 152lb today, down from 159 yesterday   -I&O, net negative 4.4L since hospital admission   3.  Aortic stenosis: -Last echocardiogram with worsening LV function to 30-35% with diffuse hypokinesis and moderate to severe AS thought to possiblly be tachy induced cardiomyopathy -Mean gradient 12mmHg but likely underestimated due to LV dysfunction  -VTI ratio 0.23 c/w severe AS  4.  CKD Stage IV/V: -Creatinine worsening today, now up to 3.38, was 3.23 yesterday  -May be nearing his baseline, dry weight -Nephrology following  5. DNR: -Palliative care consulted for goals of care discussion -Pt made DNR   Signed, Kathyrn Drown NP-C [facility] Pager: [phone] 01/09/2018, 1:08 PM     For questions or updates, please contact   Please consult [email or web address] for contact info under Cardiology/STEMI.

## 2018-01-09 NOTE — Progress Notes (Signed)
PROGRESS NOTE    Greg Booker  EQA:[id number] DOB: 12/13/1929 DOA: 01/03/2018 PCP: Raina Mina., MD   Brief Narrative:  HPI on 01/03/2018 by Ms. Jory Sims, NP (cardiology) Greg Booker is a 84 y.o. male who presents for ongoing assessment and management of CAD with most recent cath in 2006 revealed an occluded RCA with collaterals. Hx of DVT on coumadin  therapy.She is followed in our coumadin clinic and was last seen by them on 12/01/2017.   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 Had cardiac cath at [facility] on 12/12/2017 requiring DES to the RCA. Now on Plavix.  He was last seem by Dr. Debara Pickett on 10/17/2017 for and had recent echocardiogram revealing a LVEF of 60%-65% and moderate Aortic stenosis. He had been admitted for acute renal failure which did not require dialysis. He was scheduled for a fistula placement.   He comes today clearly fluid overloaded with 2+3+ pitting edema in the right leg, having gained over 10 lbs. He was advised to go to ER yesterday but wanted to wait for this appointment. He is dyspneic, falls asleep easily. His skin is spilling open and weeping on the lower right extremity.   He has been a patient of [facility] for rehab and has not been adhering to low sodium diet. He has been having worsening cough and congestion.    Interim history [facility] assumed care of this patient as he had multiple medical problems.  Patient was admitted by cardiology for volume overload/CHF.  Assessment & Plan   Acute kidney injury on chronic kidney disease, stage IV -Patient's baseline creatinine around 2.5, however on admission was 4.07 -Nephrology consulted and appreciated, may have urinary tract infection, interstitial nephritis.  Does not feel patient would be a hemodialysis candidate given his hypotension. -Continue to monitor BMP closely as patient will be getting IV  diuresis -Creatinine currently 3.38 -Renal ultrasound showed no evidence of hydronephrosis.    Acute on chronic combined CHF -Echocardiogram August 2019 showed an EF of 45 to 50%, diffuse hypokinesis, grade 2 diastolic dysfunction -BNP 1394 -Patient currently on IV Lasix 60 mg 3 times daily -Monitor intake and output, daily weights-urine output 2250cc over the past 24 hours -Cardiology following -Echocardiogram EF 30-35%, diffuse hypokinesis.   Chronic anemia -Likely multifactorial including chronic kidney disease, volume overload with CHF -hemoglobin on admission was 7, given 2u PRBCs -Upon review of patient's chart, hemoglobin was approximately 9-10 in 2018 (per care everywhere) -Hemoglobin 8.4 today- stable -Anemia panel showed adequate iron -Nephrology starting darbepoetin -FOBT + -Reviewed patient's chart via care everywhere, it seems that he has been following up with gastroenterology [place], note from 11/22/2017 noted that patient and daughter deferred a small bowel capsule endoscopy as patient's hemoglobin was stable and no bleeding was noted. -He also has had a EGD showing mild nonspecific gastropathy.  Colonoscopy showed a 7 mm sessile sigmoid polyp which was removed, grade 2 internal hemorrhoids. -Discussed with gastroenterology, unfortunately capsule endoscopy cannot be done or read this weekend, they recommended outpatient capsule endoscopy. -Had a very long and extensive discussion with patient and caregiver at bedside.  Feel that capsule endoscopy may not be needed-if bleeding was to be found, patient would likely need to discontinue his Coumadin however he is a very high risk of stroke and blood clots if he discontinues his Coumadin. -Caregiver tells me  that patient has been receiving occasional blood transfusions from his hematology oncology doctor in [place], Dr. Bobby Rumpf.  Aortic stenosis -Echocardiogram showed moderate to moderate severe, valve  severely calcified with mildly reduced leaflet mobility.  Mean systolic gradient through valve is 28 mmHg -Cardiology following  Chronic atrial fibrillation -Continue Coumadin, metoprolol -currently on amiodarone drip per cardiology  Coronary artery disease -Patient had catheterization earlier this month with drug-eluting stent placed in the RCA. -Currently no complaints of chest pain -Continue Plavix and management per cardiology  Diabetes mellitus, type II -Continue insulin sliding scale and CBG monitoring  Right lateral heel decubitus ulcer -Wound care consulted  History of prostate cancer -Status post radioactive seed placement -continue casodex -? Source of anemia in conjunction with coumadin  -Continue to follow-up with oncology upon discharge  Goals of care -Had long discussion with caregiver as well as patient -Palliative care consulted and appreciated to discuss further goals of care as well as possible hospice and CODE STATUS.  DVT Prophylaxis  Coumadin  Code Status: Full  Family Communication: Caregiver and family at bedside  Disposition Plan: Admitted. Pending cardiology recommendations for CHF and AF. Dispo pending (patient would like to go home with 24/7 care)  Consultants Nephrology Cardiology Gastroenterology Palliative care  Procedures  Echcoardiogram  Antibiotics   Anti-infectives (From admission, onward)   None      Subjective:   Greg Booker seen and examined today.  Patient feeling better today.  Wondering when he will be able to go home.  Denies current chest pain, shortness breath, abdominal pain, nausea or vomiting, diarrhea or constipation, dizziness or headache.  Objective:   Vitals:   01/08/18 1419 01/08/18 1924 01/09/18 0409 01/09/18 0729  BP: 112/66 97/72 96/62  (!) 141/89  Pulse: 80 (!) 119 78 (!) 119  Resp: (!) 21 (!) 21 18   Temp: 98.2 F (36.8 C) 98.2 F (36.8 C) 97.6 F (36.4 C) 98 F (36.7 C)  TempSrc: Oral Oral Oral  Oral  SpO2: 100% 97% 100% 98%  Weight:   69.1 kg   Height:        Intake/Output Summary (Last 24 hours) at 01/09/2018 1051 Last data filed at 01/09/2018 0500 Gross per 24 hour  Intake 1111.32 ml  Output 2250 ml  Net -1138.68 ml   Filed Weights   01/07/18 0600 01/08/18 0548 01/09/18 0409  Weight: 74.1 kg 72.5 kg 69.1 kg   Exam  General: Well developed, chronically ill-appearing, NAD  HEENT: NCAT, mucous membranes moist.   Neck: Supple  Cardiovascular: S1 S2 auscultated, 3/6 SEM, irregularly irregular  Respiratory: Clear to auscultation bilaterally with equal chest rise  Abdomen: Soft, nontender, nondistended, + bowel sounds  Extremities: Left AKA.  Right lower extremity edema improving, dressing in place, venous stasis skin changes.  Neuro: AAOx3, nonfocal  Psych: Pleasant, appropriate mood and affect  Data Reviewed: I have personally reviewed following labs and imaging studies  CBC: Recent Labs  Lab 01/03/18 1303  01/05/18 0326 01/06/18 0445 01/07/18 0336 01/07/18 2100 01/08/18 0457 01/09/18 0352  WBC 12.5*  --  9.1  --  8.2  --   --   --   NEUTROABS 10.6*  --   --   --   --   --   --   --   HGB 7.0*   < > 8.7* 7.4* 7.4* 8.5* 8.2* 8.4*  HCT 21.9*   < > 24.7* 21.5* 23.1* 25.7* 24.8* 25.4*  MCV 102.8*  --  100.0  --  103.1*  --   --   --   PLT 282  --  247  --  267  --   --   --    < > = values in this interval not displayed.   Basic Metabolic Panel: Recent Labs  Lab 01/05/18 0326 01/06/18 0445 01/07/18 0336 01/08/18 0457 01/09/18 0352  NA 136 141 138 137 136  K 3.1* 4.5 3.7 3.3* 3.5  CL 99 103 99 98 97*  CO2 24 22 24 25 26   GLUCOSE 112* 147* 135* 138* 144*  BUN 72* 83* 80* 74* 73*  CREATININE 3.79* 3.67* 3.44* 3.23* 3.38*  CALCIUM 8.5* 8.7* 8.9 8.7* 8.8*   GFR: Estimated Creatinine Clearance: 15.3 mL/min (A) (by C-G formula based on SCr of 3.38 mg/dL (H)). Liver Function Tests: Recent Labs  Lab 01/03/18 1303 01/07/18 0430  AST 19 23    ALT 14 12  ALKPHOS 50 49  BILITOT 0.8 0.6  PROT 6.7 6.3*  ALBUMIN 2.9* 2.8*   No results for input(s): LIPASE, AMYLASE in the last 168 hours. No results for input(s): AMMONIA in the last 168 hours. Coagulation Profile: Recent Labs  Lab 01/05/18 0326 01/06/18 0445 01/07/18 0336 01/08/18 0457 01/09/18 0352  INR 1.89 1.90 2.02 2.31 2.38   Cardiac Enzymes: No results for input(s): CKTOTAL, CKMB, CKMBINDEX, TROPONINI in the last 168 hours. BNP (last 3 results) No results for input(s): PROBNP in the last 8760 hours. HbA1C: No results for input(s): HGBA1C in the last 72 hours. CBG: Recent Labs  Lab 01/08/18 0734 01/08/18 1107 01/08/18 1623 01/08/18 2144 01/09/18 0727  GLUCAP 137* 142* 127* 149* 135*   Lipid Profile: No results for input(s): CHOL, HDL, LDLCALC, TRIG, CHOLHDL, LDLDIRECT in the last 72 hours. Thyroid Function Tests: Recent Labs    01/07/18 0336  TSH 4.817*  FREET4 1.04   Anemia Panel: No results for input(s): VITAMINB12, FOLATE, FERRITIN, TIBC, IRON, RETICCTPCT in the last 72 hours. Urine analysis:    Component Value Date/Time   COLORURINE YELLOW 01/03/2018 1603   APPEARANCEUR CLEAR 01/03/2018 1603   LABSPEC 1.011 01/03/2018 1603   PHURINE 6.0 01/03/2018 1603   GLUCOSEU NEGATIVE 01/03/2018 1603   HGBUR MODERATE (A) 01/03/2018 1603   BILIRUBINUR NEGATIVE 01/03/2018 1603   KETONESUR NEGATIVE 01/03/2018 1603   PROTEINUR NEGATIVE 01/03/2018 1603   UROBILINOGEN 0.2 10/29/2011 0945   NITRITE NEGATIVE 01/03/2018 1603   LEUKOCYTESUR TRACE (A) 01/03/2018 1603   Sepsis Labs: @LABRCNTIP (procalcitonin:4,lacticidven:4)  ) Recent Results (from the past 240 hour(s))  Urine Culture     Status: None   Collection Time: 01/05/18  2:22 AM  Result Value Ref Range Status   Specimen Description URINE, CATHETERIZED  Final   Special Requests Normal  Final   Culture   Final    NO GROWTH Performed at [facility], [place] [address], [name], [place]  [address]    Report Status 01/06/2018 FINAL  Final      Radiology Studies: No results found.   Scheduled Meds: . B-complex with vitamin C  1 tablet Oral Daily  . bicalutamide  50 mg Oral Daily  . clopidogrel  75 mg Oral Daily  . darbepoetin (ARANESP) injection - NON-DIALYSIS  200 mcg Subcutaneous Q Wed-1800  . dextromethorphan-guaiFENesin  1 tablet Oral BID  . furosemide  80 mg Intravenous Q8H  . gabapentin  100 mg Oral QHS  . insulin aspart  0-9 Units Subcutaneous TID WC  . metoprolol tartrate  25 mg Oral BID  .  multivitamin with minerals  1 tablet Oral Daily  . pantoprazole  40 mg Oral QAC breakfast  . senna  2 tablet Oral QHS  . sodium chloride flush  3 mL Intravenous Q12H  . tamsulosin  0.4 mg Oral QPC supper  . warfarin  2.5 mg Oral ONCE-1800  . Warfarin - Pharmacist Dosing Inpatient   Does not apply q1800   Continuous Infusions: . sodium chloride    . amiodarone 30 mg/hr (01/09/18 0628)     LOS: 6 days   Time Spent in minutes   45 minutes (greater than 50% of time spent with patient and caregiver and sister face to face, as well as reviewing records, discussing code status and goals of care, discussing with consultants, and formulating a plan)  Cristal Ford D.O. on 01/09/2018 at 10:51 AM  Between 7am to 7pm - Please see pager noted on [email or web address]  After 7pm go to [email or web address]  And look for the night coverage person covering for me after hours  [facility] Office  [phone]

## 2018-01-09 NOTE — Progress Notes (Signed)
ANTICOAGULATION CONSULT NOTE - Follow Up Consult  Pharmacy Consult for Coumadin Indication: atrial fibrillation  Allergies  Allergen Reactions  . Bicalutamide Other (See Comments)    Casodex caused increase in breast size 01/05/18: confirmed still taking this medication  . Codeine Itching and Rash  . Niacin And Related Other (See Comments)    Burning and stinging from tablets (otc capsules ok)  . Lovastatin Other (See Comments)    Leg weakness  . Morphine Other (See Comments)    "Allergic," per MAR  . Beef Extract Rash  . Pravastatin Other (See Comments)    Myalgia    Patient Measurements: Height: 5\' 9"  (175.3 cm) Weight: 152 lb 5.4 oz (69.1 kg) IBW/kg (Calculated) : 70.7  Vital Signs: Temp: 98 F (36.7 C) (12/30 0729) Temp Source: Oral (12/30 0729) BP: 141/89 (12/30 0729) Pulse Rate: 119 (12/30 0729)  Labs: Recent Labs    01/07/18 0336 01/07/18 2100 01/08/18 0457 01/09/18 0352  HGB 7.4* 8.5* 8.2* 8.4*  HCT 23.1* 25.7* 24.8* 25.4*  PLT 267  --   --   --   LABPROT 22.6*  --  25.1* 25.7*  INR 2.02  --  2.31 2.38  CREATININE 3.44*  --  3.23* 3.38*    Estimated Creatinine Clearance: 15.3 mL/min (A) (by C-G formula based on SCr of 3.38 mg/dL (H)).  Assessment:  84 yr old male admitted 12/24 with fluid overload.  To continue Coumadin as prior to admission for atrial fibrillation and hx DVT (2013?).     Caregiver  reported 12/24 most recent Coumadin regimen is 2.5 mg daily except 5 mg on Tuesdays.  She also reports that Coumadin has been held x 2 days d/t INR 4.1 on 12/22 and 3.8 on 12/23 at SNF. INR 2.8 at Cardiology office on 12/24.  Hx anemia. Aranesp 200 mg SQ weekly begun on 12/25.   INR therapeutic today at 2.38, H/H low but stable, no bleeding reported.  Also on amiodarone since 12/27.    Goal of Therapy:  INR 2-3 Monitor platelets by anticoagulation protocol: Yes   Plan:  Warfarin 2.5mg  PO x 1 tonight Daily INR, s/s bleeding  Bertis Ruddy,  PharmD Clinical Pharmacist Please check AMION for all [place] numbers 01/09/2018 8:57 AM

## 2018-01-09 NOTE — Progress Notes (Signed)
Physical Therapy Treatment Patient Details Name: Greg Booker MRN: [id number] DOB: 11-27-1929 Today's Date: 01/09/2018    History of Present Illness Pt is an 84 y.o. male admitted from [facility] on 01/03/18 with fluid overload and dyspnea; worked up for CHF and AKI on CKD IV. Went into a-fib on 12/26 requiring metoprolol. PMH includes LLE amputation (03/2017), CAD (s/p DES to RCA 12/12/17), PAD, HTN, hearing impaired, DM.    PT Comments    Noted fluctuating high HR and spoke to RN, who reports patient has been medicated and should be OK to participate in PT. Upon entry to room he was very somnolent but easily woken and willing to participate in therapy, HR 90-110BPM. He required ModA for supine to sit, however at EOB HR elevated and fluctuated between 115-135BPM; deferred further mobility due to HR and performed toe/heel raises, LAQs, and hamstring stretches seated at EOB, then returned to supine with MinA. TotalA to scoot up in bed, then S for rolling for repositioning of chuck and HR to a high of 145BPM, recovered back to 110-120 with supine rest. He was left in bed with nurse tech present and attending, all needs otherwise met this afternoon.     Follow Up Recommendations  Home health PT;Supervision/Assistance - 24 hour     Equipment Recommendations  None recommended by PT    Recommendations for Other Services       Precautions / Restrictions Precautions Precautions: Fall;Other (comment) Precaution Comments: watch HR  Restrictions Weight Bearing Restrictions: No    Mobility  Bed Mobility Overal bed mobility: Needs Assistance Bed Mobility: Supine to Sit;Sit to Supine     Supine to sit: Mod assist Sit to supine: Min assist   General bed mobility comments: ModA to come to EOB and elevate trunk, MinA to return to bed   Transfers                 General transfer comment: deferred due to high HR   Ambulation/Gait             General Gait Details:  deferred due to high HR    Stairs             Wheelchair Mobility    Modified Rankin (Stroke Patients Only)       Balance Overall balance assessment: Needs assistance Sitting-balance support: Bilateral upper extremity supported;Feet supported Sitting balance-Leahy Scale: Fair                                      Cognition Arousal/Alertness: Awake/alert Behavior During Therapy: WFL for tasks assessed/performed Overall Cognitive Status: Within Functional Limits for tasks assessed                                 General Comments: WFL for simple tasks      Exercises      General Comments        Pertinent Vitals/Pain Pain Assessment: No/denies pain Pain Score: 0-No pain    Home Living                      Prior Function            PT Goals (current goals can now be found in the care plan section) Acute Rehab PT Goals Patient Stated Goal: Return home with caregiver  support PT Goal Formulation: With patient Time For Goal Achievement: 01/20/18 Potential to Achieve Goals: Good Progress towards PT goals: Not progressing toward goals - comment(limited by elevated HR this session )    Frequency    Min 3X/week      PT Plan Current plan remains appropriate    Co-evaluation              AM-PAC PT "6 Clicks" Mobility   Outcome Measure  Help needed turning from your back to your side while in a flat bed without using bedrails?: A Little Help needed moving from lying on your back to sitting on the side of a flat bed without using bedrails?: A Lot Help needed moving to and from a bed to a chair (including a wheelchair)?: A Lot Help needed standing up from a chair using your arms (e.g., wheelchair or bedside chair)?: A Lot Help needed to walk in hospital room?: Total Help needed climbing 3-5 steps with a railing? : Total 6 Click Score: 11    End of Session   Activity Tolerance: Patient tolerated treatment  well Patient left: in bed;with nursing/sitter in room;with bed alarm set   PT Visit Diagnosis: Other abnormalities of gait and mobility (R26.89);Muscle weakness (generalized) (M62.81)     Time: 1530-1600 PT Time Calculation (min) (ACUTE ONLY): 30 min  Charges:  $Therapeutic Exercise: 8-22 mins $Therapeutic Activity: 8-22 mins                     Deniece Ree PT, DPT, CBIS  Supplemental Physical Therapist [place]    Pager [phone] Acute Rehab Office [phone]

## 2018-01-10 LAB — GLUCOSE, CAPILLARY
Glucose-Capillary: 121 mg/dL — ABNORMAL HIGH (ref 70–99)
Glucose-Capillary: 155 mg/dL — ABNORMAL HIGH (ref 70–99)

## 2018-01-10 LAB — PREPARE RBC (CROSSMATCH)

## 2018-01-10 LAB — BASIC METABOLIC PANEL
Anion gap: 14 (ref 5–15)
BUN: 72 mg/dL — ABNORMAL HIGH (ref 8–23)
CO2: 27 mmol/L (ref 22–32)
Calcium: 8.7 mg/dL — ABNORMAL LOW (ref 8.9–10.3)
Chloride: 97 mmol/L — ABNORMAL LOW (ref 98–111)
Creatinine, Ser: 3.35 mg/dL — ABNORMAL HIGH (ref 0.61–1.24)
GFR calc non Af Amer: 16 mL/min — ABNORMAL LOW (ref >60)
GFR, EST AFRICAN AMERICAN: 18 mL/min — AB (ref >60)
GLUCOSE: 118 mg/dL — AB (ref 70–99)
Potassium: 3.4 mmol/L — ABNORMAL LOW (ref 3.5–5.1)
Sodium: 138 mmol/L (ref 135–145)

## 2018-01-10 LAB — HEMOGLOBIN AND HEMATOCRIT, BLOOD
HCT: 25.7 % — ABNORMAL LOW (ref 39.0–52.0)
HEMATOCRIT: 22.9 % — AB (ref 39.0–52.0)
Hemoglobin: 7.3 g/dL — ABNORMAL LOW (ref 13.0–17.0)
Hemoglobin: 8.7 g/dL — ABNORMAL LOW (ref 13.0–17.0)

## 2018-01-10 LAB — PROTIME-INR
INR: 2.35
Prothrombin Time: 25.4 seconds — ABNORMAL HIGH (ref 11.4–15.2)

## 2018-01-10 LAB — MAGNESIUM: Magnesium: 2.5 mg/dL — ABNORMAL HIGH (ref 1.7–2.4)

## 2018-01-10 MED ORDER — FUROSEMIDE 40 MG PO TABS: 40.0000 mg | ORAL_TABLET | Freq: Every evening | ORAL | Status: DC

## 2018-01-10 MED ORDER — FUROSEMIDE 80 MG PO TABS: 80.0000 mg | ORAL_TABLET | Freq: Every morning | ORAL | Status: DC

## 2018-01-10 MED ORDER — FUROSEMIDE 10 MG/ML IJ SOLN
20.0000 mg | Freq: Once | INTRAMUSCULAR | Status: AC
  Administered 2018-01-10: 20 mg via INTRAVENOUS
  Filled 2018-01-10: qty 2

## 2018-01-10 MED ORDER — METOPROLOL TARTRATE 50 MG PO TABS: 50.0000 mg | ORAL_TABLET | Freq: Two times a day (BID) | ORAL | 0 refills | Status: DC

## 2018-01-10 MED ORDER — FUROSEMIDE 80 MG PO TABS: 80.0000 mg | ORAL_TABLET | Freq: Every morning | ORAL | 0 refills | Status: DC

## 2018-01-10 MED ORDER — METOPROLOL TARTRATE 50 MG PO TABS: 50.0000 mg | ORAL_TABLET | Freq: Two times a day (BID) | ORAL | 0 refills | Status: AC

## 2018-01-10 MED ORDER — GUAIFENESIN 100 MG/5ML PO LIQD: 200.0000 mg | Freq: Four times a day (QID) | ORAL | 0 refills | Status: AC | PRN

## 2018-01-10 MED ORDER — POTASSIUM CHLORIDE CRYS ER 20 MEQ PO TBCR
40.0000 meq | EXTENDED_RELEASE_TABLET | Freq: Once | ORAL | Status: AC
  Administered 2018-01-10: 40 meq via ORAL
  Filled 2018-01-10: qty 2

## 2018-01-10 MED ORDER — FUROSEMIDE 40 MG PO TABS: 40.0000 mg | ORAL_TABLET | Freq: Every evening | ORAL | 0 refills | Status: DC

## 2018-01-10 MED ORDER — AMIODARONE HCL 200 MG PO TABS: 200.0000 mg | ORAL_TABLET | Freq: Two times a day (BID) | ORAL | 0 refills | Status: AC

## 2018-01-10 MED ORDER — WARFARIN SODIUM 2.5 MG PO TABS: 2.5000 mg | ORAL_TABLET | Freq: Once | ORAL | Status: DC

## 2018-01-10 MED ORDER — GUAIFENESIN 100 MG/5ML PO LIQD: 200.0000 mg | Freq: Four times a day (QID) | ORAL | 0 refills | Status: DC | PRN

## 2018-01-10 MED ORDER — AMIODARONE HCL 200 MG PO TABS: 200.0000 mg | ORAL_TABLET | Freq: Two times a day (BID) | ORAL | 0 refills | Status: DC

## 2018-01-10 NOTE — Progress Notes (Signed)
Physical Therapy Treatment Patient Details Name: Greg Booker MRN: [id number] DOB: 06/26/1929 Today's Date: 01/10/2018    History of Present Illness Pt is an 84 y.o. male admitted from [facility] on 01/03/18 with fluid overload and dyspnea; worked up for CHF and AKI on CKD IV. Went into a-fib on 12/26 requiring metoprolol. PMH includes LLE amputation (03/2017), CAD (s/p DES to RCA 12/12/17), PAD, HTN, hearing impaired, DM.    PT Comments    Pt admitted with above diagnosis. Pt currently with functional limitations due to balance and endurance deficits. Pt is at baseline and most likely going home today.  Has all equipment.  Pt will benefit from skilled PT to increase their independence and safety with mobility to allow discharge to the venue listed below.     Follow Up Recommendations  Home health PT;Supervision/Assistance - 24 hour     Equipment Recommendations  None recommended by PT    Recommendations for Other Services       Precautions / Restrictions Precautions Precautions: Fall;Other (comment) Precaution Comments: watch HR  Restrictions Weight Bearing Restrictions: No    Mobility  Bed Mobility Overal bed mobility: Needs Assistance Bed Mobility: Supine to Sit;Sit to Supine     Supine to sit: Min guard     General bed mobility comments: Bed wet as pts condom cath had leaked.  Min guard A to come to EOB and elevate trunk, much better movmeent than previous treatment.    Transfers Overall transfer level: Needs assistance   Transfers: Squat Pivot Transfers     Squat pivot transfers: Min guard     General transfer comment: Pt squat pivot to wheelchair with armrest moved with guard assist only. Pt and caregiver [name] states pt is at baseline.     Ambulation/Gait                 Stairs             Wheelchair Mobility    Modified Rankin (Stroke Patients Only)       Balance Overall balance assessment: Needs  assistance Sitting-balance support: Feet supported;No upper extremity supported Sitting balance-Leahy Scale: Fair                                      Cognition Arousal/Alertness: Awake/alert Behavior During Therapy: WFL for tasks assessed/performed Overall Cognitive Status: Within Functional Limits for tasks assessed                                 General Comments: WFL for simple tasks      Exercises      General Comments        Pertinent Vitals/Pain Pain Assessment: No/denies pain    Home Living                      Prior Function            PT Goals (current goals can now be found in the care plan section) Acute Rehab PT Goals Patient Stated Goal: Return home with caregiver support Progress towards PT goals: Progressing toward goals    Frequency    Min 3X/week      PT Plan Current plan remains appropriate    Co-evaluation              AM-PAC PT "  6 Clicks" Mobility   Outcome Measure  Help needed turning from your back to your side while in a flat bed without using bedrails?: A Little Help needed moving from lying on your back to sitting on the side of a flat bed without using bedrails?: A Little Help needed moving to and from a bed to a chair (including a wheelchair)?: A Little Help needed standing up from a chair using your arms (e.g., wheelchair or bedside chair)?: A Little Help needed to walk in hospital room?: Total Help needed climbing 3-5 steps with a railing? : Total 6 Click Score: 14    End of Session Equipment Utilized During Treatment: Gait belt Activity Tolerance: Patient tolerated treatment well Patient left: in chair;with call bell/phone within reach;with chair alarm set;with family/visitor present(caregiver [name]) Nurse Communication: Mobility status PT Visit Diagnosis: Other abnormalities of gait and mobility (R26.89);Muscle weakness (generalized) (M62.81)     Time: 0100-7121 PT Time  Calculation (min) (ACUTE ONLY): 24 min  Charges:  $Therapeutic Activity: 23-37 mins                     [place] Pager:  [phone]  Office:  [place] 01/10/2018, 12:23 PM

## 2018-01-10 NOTE — Care Management Note (Signed)
Case Management Note  Patient Details  Name: Greg Booker MRN: [id number] Date of Birth: 1929/04/14  Subjective/Objective:  Pt presented for Acute on Chronic Heart Failure. PTA from home with support of daughter.                  Action/Plan: CM did offer choice to patient-patient chose [facility]- referral made and accepted patient. SOC to begin within 24-48 hours of transition home. Clinicals sent to [facility].   Expected Discharge Date:  01/10/18               Expected Discharge Plan:  [place]  In-House Referral:  NA  Discharge planning Services  CM Consult  Post Acute Care Choice:  Home Health Choice offered to:  Patient, Adult Children  DME Arranged:  N/A DME Agency:  NA  HH Arranged:  PT [place] Agency:  [facility]  Status of Service:  Completed, signed off  If discussed at [place] of Stay Meetings, dates discussed:    Additional Comments:  Bethena Roys, RN 01/10/2018, 4:16 PM

## 2018-01-10 NOTE — Discharge Summary (Signed)
Physician Discharge Summary  Greg Booker AOZ:[id number] DOB: January 03, 1930 DOA: 01/03/2018  PCP: Greg Booker., MD  Admit date: 01/03/2018 Discharge date: 01/10/2018  Time spent: 45 minutes  Recommendations for Outpatient Follow-up:  Patient will be discharged to home with home health physical and occupational therapy.  Patient will need to follow up with primary care provider within one week of discharge. Follow up with cardiology at specified time. Follow up with hematology and oncology- repeat CBC, BMP. Patient should continue medications as prescribed.  Patient should follow a heart healthy diet.   Discharge Diagnoses:  Acute kidney injury on chronic kidney disease, stage IV Acute on chronic combined CHF Chronic anemia Aortic stenosis Chronic atrial fibrillation Coronary artery disease Diabetes mellitus, type II Right lateral heel decubitus ulcer History of prostate cancer Goals of care  Discharge Condition: Stable  Diet recommendation: heart healthy  Filed Weights   01/08/18 0548 01/09/18 0409 01/10/18 0624  Weight: 72.5 kg 69.1 kg 70.4 kg    History of present illness:  on 01/03/2018 by Ms. Greg Sims, NP (cardiology) Greg Booker is a 84 y.o. male who presents for ongoing assessment and management of CAD with most recent cath in 2006 revealed an occluded RCA with collaterals. Hx of DVT on coumadin therapy.She is followed in our coumadin clinic and was last seen by them on 12/01/2017.   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 Had cardiac cath at [facility] on 12/12/2017 requiring DES to the RCA. Now on Plavix.  He was last seem by Dr. Debara Booker on 10/17/2017 for and had recent echocardiogram revealing a LVEF of 60%-65% and moderate Aortic stenosis. He had been admitted for acute renal failure which did not require dialysis. He was scheduled for a fistula placement.    He comes today clearly fluid overloaded with 2+3+ pitting edema in the right leg, having gained over 10 lbs. He was advised to go to ER yesterday but wanted to wait for this appointment. He is dyspneic, falls asleep easily. His skin is spilling open and weeping on the lower right extremity.   He has been a patient of Greg Booker for rehab and has not been adhering to low sodium diet. He has been having worsening cough and congestion.   Hospital Course:  Acute kidney injury on chronic kidney disease, stage IV -Patient's baseline creatinine around 2.5, however on admission was 4.07 -Nephrology consulted and appreciated, may have urinary tract infection, interstitial nephritis.  Does not feel patient would be a hemodialysis candidate given his hypotension. -Continue to monitor BMP closely as patient will be getting IV diuresis -Creatinine currently 3.38 -Renal ultrasound showed no evidence of hydronephrosis.    Acute on chronic combined CHF -Echocardiogram August 2019 showed an EF of 45 to 50%, diffuse hypokinesis, grade 2 diastolic dysfunction -BNP 1394 -Echocardiogram EF 30-35%, diffuse hypokinesis.  -Cardiology consulted and appreciated -Patient initially placed on Lasix 60 mg IV 3 times daily and transition to oral Lasix-will discharge with 80 mg every morning, 40 mg every afternoon  Chronic anemia -Likely multifactorial including chronic kidney disease, volume overload with CHF -hemoglobin on admission was 7 -Upon review of patient's chart, hemoglobin was approximately 9-10 in 2018 (per care everywhere) -Anemia panel showed adequate iron -Nephrology starting darbepoetin -FOBT + -Reviewed patient's chart via care everywhere, it seems that he has been following up with gastroenterology Greg Booker, note from  11/22/2017 noted that patient and daughter deferred a small bowel capsule endoscopy as patient's hemoglobin was stable and no bleeding was noted. -He also has  had a EGD showing mild nonspecific gastropathy.  Colonoscopy showed a 7 mm sessile sigmoid polyp which was removed, grade 2 internal hemorrhoids. -Discussed with gastroenterology, unfortunately capsule endoscopy cannot be done or read this weekend, they recommended outpatient capsule endoscopy. -Had a very long and extensive discussion with patient and caregiver at bedside.  Feel that capsule endoscopy may not be needed-if bleeding was to be found, patient would likely need to discontinue his Coumadin however he is a very high risk of stroke and blood clots if he discontinues his Coumadin. -Caregiver tells me that patient has been receiving occasional blood transfusions from his hematology oncology doctor in [place], Dr. Bobby Booker. -Patient has received 2 units PRBC.  Hemoglobin unfortunately's morning dropped to 7.3.  Ttransfused 1 additional unit-  Repeat hemoglobin 8.7 -Follow-up with hematology oncology and have repeat CBC, discuss further need for transfusions in the future  Aortic stenosis -Echocardiogram showed moderate to moderate severe, valve severely calcified with mildly reduced leaflet mobility.  Mean systolic gradient through valve is 28 mmHg -Cardiology following  Chronic atrial fibrillation -Continue Coumadin, metoprolol -Was placed on amiodarone drip per cardiology and now transitioned to amiodarone orally 200 mg twice daily -INR 2.35  Coronary artery disease -Patient had catheterization earlier this month with drug-eluting stent placed in the RCA. -Currently no complaints of chest pain -Continue Plavix and management per cardiology  Diabetes mellitus, type II -Continue insulin sliding scale and CBG monitoring  Right lateral heel decubitus ulcer -Wound care consulted  History of prostate cancer -Status post radioactive seed placement -continue casodex -? Source of anemia in conjunction with coumadin  -Continue to follow-up with oncology upon  discharge  Hypokalemia -replaced, repeat BMP in one week  Goals of care -Had long discussion with caregiver as well as patient -Palliative care consulted and appreciated to discuss further goals of care as well as possible hospice and CODE STATUS.  Consultants Nephrology Cardiology Gastroenterology Palliative care  Procedures  Echcoardiogram  Discharge Exam: Vitals:   01/10/18 0917 01/10/18 1201  BP: (!) 93/50 94/69  Pulse: (!) 45 99  Resp: 16 19  Temp: 98 F (36.7 C) 97.8 F (36.6 C)  SpO2: 100% 100%   Feeling better today, wanting to go home.  Denies current chest pain, shortness breath, abdominal pain, nausea or vomiting, diarrhea constipation, dizziness or headache.   General: Well developed, chronically ill-appearing, NAD  HEENT: NCAT, mucous membranes moist.  Neck: Supple  Cardiovascular: S1 S2 auscultated, 3/6 SEM, irregular  Respiratory: Clear to auscultation bilaterally with equal chest rise  Abdomen: Soft, nontender, nondistended, + bowel sounds  Extremities: Left AKA.  Right lower extremity edema improved, dressing in place, chronic venous stasis skin changes  Neuro: AAOx3, nonfocal  Psych: Normal affect and demeanor with intact judgement and insight, pleasant  Discharge Instructions Discharge Instructions    Discharge instructions   Complete by:  As directed    Patient will be discharged to home with home health physical and occupational therapy.  Patient will need to follow up with primary care provider within one week of discharge. Follow up with cardiology at specified time. Follow up with hematology and oncology- repeat CBC, BMP. Patient should continue medications as prescribed.  Patient should follow a heart healthy diet.     Allergies as of 01/10/2018      Reactions   Bicalutamide Other (See  Comments)   Casodex caused increase in breast size 01/05/18: confirmed still taking this medication   Codeine Itching, Rash   Niacin And  Related Other (See Comments)   Burning and stinging from tablets (otc capsules ok)   Lovastatin Other (See Comments)   Leg weakness   Morphine Other (See Comments)   "Allergic," per MAR   Beef Extract Rash   Pravastatin Other (See Comments)   Myalgia      Medication List    STOP taking these medications   aspirin EC 81 MG tablet   bumetanide 1 MG tablet Commonly known as:  BUMEX   diltiazem 240 MG 24 hr capsule Commonly known as:  CARDIZEM CD   docusate sodium 100 MG capsule Commonly known as:  COLACE   isosorbide mononitrate 60 MG 24 hr tablet Commonly known as:  IMDUR   ranolazine 500 MG 12 hr tablet Commonly known as:  RANEXA     TAKE these medications   acetaminophen 500 MG tablet Commonly known as:  TYLENOL Take 500 mg by mouth every 6 (six) hours as needed (for pain).   ALPRAZolam 0.25 MG tablet Commonly known as:  XANAX Take 0.25 mg by mouth every 6 (six) hours as needed for anxiety (for 14 days).   amiodarone 200 MG tablet Commonly known as:  PACERONE Take 1 tablet (200 mg total) by mouth 2 (two) times daily.   BENEFIBER Powd Take by mouth See admin instructions. Mix 2 teaspoonsful into a liquid of choice and drink at bedtime   bicalutamide 50 MG tablet Commonly known as:  CASODEX Take 50 mg by mouth daily.   cholecalciferol 25 MCG (1000 UT) tablet Commonly known as:  VITAMIN D3 Take 2,000 Units by mouth daily. What changed:  Another medication with the same name was removed. Continue taking this medication, and follow the directions you see here.   clopidogrel 75 MG tablet Commonly known as:  PLAVIX Take 75 mg by mouth daily.   furosemide 40 MG tablet Commonly known as:  LASIX Take 1 tablet (40 mg total) by mouth every evening. What changed:  You were already taking a medication with the same name, and this prescription was added. Make sure you understand how and when to take each.   furosemide 80 MG tablet Commonly known as:  LASIX Take 1  tablet (80 mg total) by mouth every morning. Start taking on:  January 11, 2018 What changed:    medication strength  how much to take  when to take this   gabapentin 100 MG capsule Commonly known as:  NEURONTIN Take 100 mg by mouth at bedtime.   guaiFENesin 100 MG/5ML liquid Commonly known as:  GERI-TUSSIN Take 10 mLs (200 mg total) by mouth 4 (four) times daily as needed for up to 9 days (for expectorant).   ipratropium-albuterol 0.5-2.5 (3) MG/3ML Soln Commonly known as:  DUONEB Take 3 mLs by nebulization See admin instructions. Nebulize 3 ml's four times a day and 3 ml's every two hours as needed for shortness of breath   Melatonin 3 MG Tabs Take 6 mg by mouth at bedtime.   metoprolol tartrate 50 MG tablet Commonly known as:  LOPRESSOR Take 1 tablet (50 mg total) by mouth 2 (two) times daily. What changed:    medication strength  how much to take   multivitamin with minerals tablet Take 1 tablet by mouth daily.   PRESERVISION AREDS Caps Take 1 capsule by mouth daily.   MYLANTA 200-200-20 MG/5ML suspension  Generic drug:  alum & mag hydroxide-simeth Take 30 mLs by mouth every 4 (four) hours as needed for indigestion.   nitroGLYCERIN 0.4 MG SL tablet Commonly known as:  NITROSTAT Place 1 tablet (0.4 mg total) under the tongue every 5 (five) minutes as needed for chest pain. What changed:  when to take this   omeprazole 20 MG tablet Commonly known as:  PRILOSEC OTC Take 20 mg by mouth daily before breakfast.   polyethylene glycol powder powder Commonly known as:  GLYCOLAX/MIRALAX Take 17 g by mouth daily.   ranitidine 150 MG tablet Commonly known as:  ZANTAC Take 150 mg by mouth daily.   [place] 420 MG/3.5ML Soct Generic drug:  Evolocumab with Infusor Inject 420 mg into the skin every 30 (thirty) days.   senna 8.6 MG Tabs tablet Commonly known as:  SENOKOT Take 2 tablets by mouth at bedtime.   sodium bicarbonate 650 MG tablet Take  650 mg by mouth 2 (two) times daily.   tamsulosin 0.4 MG Caps capsule Commonly known as:  FLOMAX Take 0.4 mg by mouth daily after supper.   VENTOLIN HFA 108 (90 Base) MCG/ACT inhaler Generic drug:  albuterol Inhale 2 puffs into the lungs every 6 (six) hours as needed for wheezing.   VITAMIN B COMPLEX PO Take 1 tablet by mouth daily.   vitamin C 500 MG tablet Commonly known as:  ASCORBIC ACID Take 500 mg by mouth daily.   warfarin 2 MG tablet Commonly known as:  COUMADIN Take as directed. If you are unsure how to take this medication, talk to your nurse or doctor. Original instructions:  Take 2 mg by mouth daily.   warfarin 5 MG tablet Commonly known as:  COUMADIN Take as directed. If you are unsure how to take this medication, talk to your nurse or doctor. Original instructions:  Take 1/2 to 1 tablet by mouth daily as directed      Allergies  Allergen Reactions  . Bicalutamide Other (See Comments)    Casodex caused increase in breast size 01/05/18: confirmed still taking this medication  . Codeine Itching and Rash  . Niacin And Related Other (See Comments)    Burning and stinging from tablets (otc capsules ok)  . Lovastatin Other (See Comments)    Leg weakness  . Morphine Other (See Comments)    "Allergic," per MAR  . Beef Extract Rash  . Pravastatin Other (See Comments)    Myalgia   Follow-up Information    Greg Booker., MD. Schedule an appointment as soon as possible for a visit in 1 week(s).   Specialty:  Internal Medicine Why:  Hospital follow up Contact information: [place] [address] [phone]        Pixie Casino, MD .   Specialty:  Cardiology Contact information: [address] [place] [address] [phone]            The results of significant diagnostics from this hospitalization (including imaging, microbiology, ancillary and laboratory) are listed below for reference.    Significant  Diagnostic Studies: Dg Chest 2 View  Result Date: 01/05/2018 CLINICAL DATA:  Cough. EXAM: CHEST - 2 VIEW COMPARISON:  01/04/2018 FINDINGS: Stable mild enlargement of the cardiopericardial silhouette. Increased retrocardiac airspace opacity. Atherosclerotic calcification of the aortic arch. Calcified granulomas at the right lung base. Bilateral interstitial accentuation similar to prior. Blunting of both costophrenic angles posteriorly. Bony demineralization. IMPRESSION: 1. Cardiomegaly with interstitial edema. 2. Small bilateral  pleural effusions. Increased airspace opacity in the retrocardiac region potentially from increased atelectasis or pneumonia. 3. Old granulomatous disease. Electronically Signed   By: Van Clines M.D.   On: 01/05/2018 09:15   Dg Chest 2 View  Result Date: 01/04/2018 CLINICAL DATA:  Chest congestion cough and sob x5 weeks EXAM: CHEST - 2 VIEW COMPARISON:  12/24/2017 FINDINGS: Mild hyperinflation on the lateral view. A mid to lower thoracic vertebral compression deformity is moderate. Midline trachea. Cardiomegaly accentuated by AP portable technique. Atherosclerosis in the transverse aorta. Small left pleural effusion, decreased. Resolved interstitial edema. Improved left greater than right base airspace disease. IMPRESSION: 1. Cardiomegaly with resolution of interstitial edema. 2. Small left pleural effusion, decreased. 3. Improved bibasilar airspace disease, likely atelectasis. At the left lung base, residual infection/aspiration cannot be excluded. Electronically Signed   By: Abigail Miyamoto M.D.   On: 01/04/2018 07:19   US Renal  Result Date: 01/03/2018 CLINICAL DATA:  Chronic renal insufficiency. Congestive heart failure and shortness of breath. EXAM: RENAL / URINARY TRACT ULTRASOUND COMPLETE COMPARISON:  CT of the chest, abdomen and pelvis performed 11/29/2017 FINDINGS: Right Kidney: Renal measurements: 9.5 x 5.9 x 5.3 cm = volume: 160.4 mL. Increased parenchymal  echogenicity is noted. No mass or hydronephrosis visualized. Left Kidney: Renal measurements: 11.2 x 5.6 x 5.6 cm = volume: 182.6 mL. Increased parenchymal echogenicity is noted. No mass or hydronephrosis visualized. Bladder: Decompressed, with a Foley catheter in place. Small bilateral pleural effusions are noted. IMPRESSION: 1. No evidence of hydronephrosis. 2. Increased renal parenchymal echogenicity likely reflects medical renal disease. 3. Small bilateral pleural effusions noted. Electronically Signed   By: Garald Balding M.D.   On: 01/03/2018 21:38    Microbiology: Recent Results (from the past 240 hour(s))  Urine Culture     Status: None   Collection Time: 01/05/18  2:22 AM  Result Value Ref Range Status   Specimen Description URINE, CATHETERIZED  Final   Special Requests Normal  Final   Culture   Final    NO GROWTH Performed at [facility], [address], [place], [place] [address]    Report Status 01/06/2018 FINAL  Final     Labs: Basic Metabolic Panel: Recent Labs  Lab 01/06/18 0445 01/07/18 0336 01/08/18 0457 01/09/18 0352 01/10/18 0357  NA 141 138 137 136 138  K 4.5 3.7 3.3* 3.5 3.4*  CL 103 99 98 97* 97*  CO2 22 24 25 26 27   GLUCOSE 147* 135* 138* 144* 118*  BUN 83* 80* 74* 73* 72*  CREATININE 3.67* 3.44* 3.23* 3.38* 3.35*  CALCIUM 8.7* 8.9 8.7* 8.8* 8.7*  MG  --   --   --   --  2.5*   Liver Function Tests: Recent Labs  Lab 01/07/18 0430  AST 23  ALT 12  ALKPHOS 49  BILITOT 0.6  PROT 6.3*  ALBUMIN 2.8*   No results for input(s): LIPASE, AMYLASE in the last 168 hours. No results for input(s): AMMONIA in the last 168 hours. CBC: Recent Labs  Lab 01/05/18 0326  01/07/18 0336 01/07/18 2100 01/08/18 0457 01/09/18 0352 01/10/18 0357 01/10/18 1445  WBC 9.1  --  8.2  --   --   --   --   --   HGB 8.7*   < > 7.4* 8.5* 8.2* 8.4* 7.3* 8.7*  HCT 24.7*   < > 23.1* 25.7* 24.8* 25.4* 22.9* 25.7*  MCV 100.0  --  103.1*  --   --   --   --   --  PLT  247  --  267  --   --   --   --   --    < > = values in this interval not displayed.   Cardiac Enzymes: No results for input(s): CKTOTAL, CKMB, CKMBINDEX, TROPONINI in the last 168 hours. BNP: BNP (last 3 results) Recent Labs    01/03/18 1303  BNP 1,394.2*    ProBNP (last 3 results) No results for input(s): PROBNP in the last 8760 hours.  CBG: Recent Labs  Lab 01/09/18 1206 01/09/18 1639 01/09/18 2127 01/10/18 0741 01/10/18 1126  GLUCAP 114* 145* 153* 121* 155*       Signed:  Joseph Bias  [facility] 01/10/2018, 3:22 PM

## 2018-01-10 NOTE — Progress Notes (Signed)
   01/10/18 1017  Clinical Encounter Type  Visited With Patient and family together  Visit Type Follow-up;Other (Comment) (AD)  Referral From Chaplain  Consult/Referral To Chaplain  The chaplain responded to a spiritual care consult to complete AD.  The Pt. caregiver-Angela was present with the Pt. The notary and witnesses were called to Pt. room.  The Pt. signed his AD. A copy of the Pt.'s AD was placed in the Pt. chart and the original was given to the Pt.  The chaplain will F/U with spiritual care as needed.

## 2018-01-10 NOTE — Progress Notes (Addendum)
Progress Note  Patient Name: Greg Booker Date of Encounter: 01/10/2018  Primary Cardiologist: Pixie Casino, MD   Subjective   Denies any chest pain or shortness of breath.  Heart rate much improved and now in the 90s.  Amiodarone p.o. started yesterday  Inpatient Medications    Scheduled Meds: . amiodarone  200 mg Oral BID  . B-complex with vitamin C  1 tablet Oral Daily  . bicalutamide  50 mg Oral Daily  . clopidogrel  75 mg Oral Daily  . darbepoetin (ARANESP) injection - NON-DIALYSIS  200 mcg Subcutaneous Q Wed-1800  . dextromethorphan-guaiFENesin  1 tablet Oral BID  . furosemide  20 mg Intravenous Once  . furosemide  80 mg Oral BID  . gabapentin  100 mg Oral QHS  . insulin aspart  0-9 Units Subcutaneous TID WC  . metoprolol tartrate  50 mg Oral BID  . multivitamin with minerals  1 tablet Oral Daily  . pantoprazole  40 mg Oral QAC breakfast  . senna  2 tablet Oral QHS  . sodium chloride flush  3 mL Intravenous Q12H  . tamsulosin  0.4 mg Oral QPC supper  . Warfarin - Pharmacist Dosing Inpatient   Does not apply q1800   Continuous Infusions: . sodium chloride     PRN Meds: sodium chloride, acetaminophen, albuterol, ALPRAZolam, alum & mag hydroxide-simeth, metoprolol tartrate, ondansetron (ZOFRAN) IV, sodium chloride flush   Vital Signs    Vitals:   01/09/18 2318 01/10/18 0624 01/10/18 0835 01/10/18 0852  BP: 107/77 112/90 103/73 91/64  Pulse: (!) 125 82 96 (!) 103  Resp:  15  (!) 24  Temp:  97.8 F (36.6 C)  98 F (36.7 C)  TempSrc:  Oral  Oral  SpO2:  96%  99%  Weight:  70.4 kg    Height:        Intake/Output Summary (Last 24 hours) at 01/10/2018 0902 Last data filed at 01/10/2018 4008 Gross per 24 hour  Intake 340.08 ml  Output 1275 ml  Net -934.92 ml   Filed Weights   01/08/18 0548 01/09/18 0409 01/10/18 0624  Weight: 72.5 kg 69.1 kg 70.4 kg    Physical Exam   GEN: Well nourished, well developed in no acute distress HEENT:  Normal NECK: No JVD; No carotid bruits LYMPHATICS: No lymphadenopathy CARDIAC: Irregularly irregular, no murmurs, rubs, gallops RESPIRATORY:  Clear to auscultation without rales, wheezing or rhonchi  ABDOMEN: Soft, non-tender, non-distended MUSCULOSKELETAL:  No edema; No deformity  SKIN: Warm and dry NEUROLOGIC:  Alert and oriented x 3 PSYCHIATRIC:  Normal affect    Labs    Chemistry Recent Labs  Lab 01/03/18 1303  01/07/18 0430 01/08/18 0457 01/09/18 0352 01/10/18 0357  NA 137   < >  --  137 136 138  K 3.9   < >  --  3.3* 3.5 3.4*  CL 99   < >  --  98 97* 97*  CO2 25   < >  --  _0 GLUCOSE 129*   < >  --  138* 144* 118*  BUN 64*   < >  --  74* 73* 72*  CREATININE 4.07*   < >  --  3.23* 3.38* 3.35*  CALCIUM 8.9   < >  --  8.7* 8.8* 8.7*  PROT 6.7  --  6.3*  --   --   --   ALBUMIN 2.9*  --  2.8*  --   --   --  AST 19  --  23  --   --   --   ALT 14  --  12  --   --   --   ALKPHOS 50  --  49  --   --   --   BILITOT 0.8  --  0.6  --   --   --   GFRNONAA 12*   < >  --  16* 16* 16*  GFRAA 14*   < >  --  19* 18* 18*  ANIONGAP 13   < >  --  _0 < > = values in this interval not displayed.     Hematology Recent Labs  Lab 01/03/18 1303  01/05/18 0326  01/07/18 0336  01/08/18 0457 01/09/18 0352 01/10/18 0357  WBC 12.5*  --  9.1  --  8.2  --   --   --   --   RBC 2.13*  --  2.47*  --  2.24*  --   --   --   --   HGB 7.0*   < > 8.7*   < > 7.4*   < > 8.2* 8.4* 7.3*  HCT 21.9*   < > 24.7*   < > 23.1*   < > 24.8* 25.4* 22.9*  MCV 102.8*  --  100.0  --  103.1*  --   --   --   --   MCH 32.9  --  35.2*  --  33.0  --   --   --   --   MCHC 32.0  --  35.2  --  32.0  --   --   --   --   RDW 18.2*  --  19.4*  --  19.5*  --   --   --   --   PLT 282  --  247  --  267  --   --   --   --    < > = values in this interval not displayed.    Cardiac EnzymesNo results for input(s): TROPONINI in the last 168 hours. No results for input(s): TROPIPOC in the last 168 hours.    BNP Recent Labs  Lab 01/03/18 1303  BNP 1,394.2*     DDimer No results for input(s): DDIMER in the last 168 hours.   Radiology    No results found.  Telemetry    01/09/18 AF with HR 110-120's - Personally Reviewed  ECG    No new tracing as of 01/09/18- Personally Reviewed  Cardiac Studies   Echocardiogram: Study Conclusions  - Left ventricle: The cavity size was normal. Systolic function was moderately to severely reduced. The estimated ejection fraction was in the range of 30% to 35%. Diffuse hypokinesis. Stroke volume: 71 ml. Stroke volume/bsa: 38 ml/m^2. - Aortic valve: Valve mobility was moderately restricted. There was trivial regurgitation. Mean gradient (S): 28 mm Hg. Valve area (Vmax): 1 cm^2. - Mitral valve: Calcified annulus. There was trivial regurgitation. - Left atrium: The atrium was moderately dilated. - Right ventricle: Systolic function was mildly to moderately reduced.  Impressions:  - Exam performed in atrial fibrillation with RVR. LVEF is 30-35%. RV systolic function is mild-moderately reduced.  Aortic stenosis is likely moderate to moderate-severe. The valve is severely calcified, with moderately reduced leaflet mobility. Stroke volume index is 38 mL/m2 despite reduced ejection fraction. Mean systolic gradient through AV is 28 mmHg. Valve area is subject to error from LVOT diameter and LVOT TVI,  calculates to approximately 1 cm2.  Gradient was interrogated from multiple windows with apical window producing highest gradient.  Patient Profile     84 y.o. male with AKI on CKD, acute decompensated diastolic CHF, PAF, PAD s/p amputation and anemia, admitted12/67fr CHF.  Assessment & Plan    1. AFib with RVR: -HR improved today and now in the 90s on p.o. amiodarone -Continue metoprolol to 532mtwice daily and amiodarone 200 mg p.o. twice daily -Cardizem stopped due to LV dysfunction -Chronic  anticoagulation with coumadin per pharmacy  -INR, 2.35 today   2. Acute on chronic systolic and diastolic CHF: -He put out 1.275 L yesterday and is net -5.636 L. -Weight is is up 3 pounds from yesterday despite good diuresis.  Questionable accuracy -Creatinine stable at 3.35 today.  It was 3.38 yesterday. -He appears euvolemic on exam.  Weight down 16 pounds from admission -Replete potassium per TRH to keep K > 4 -Decrease Lasix to 80 mg every morning and 40 mg every afternoon -Check a be met in 1 week in the office -Continue beta-blocker.   -BP too soft for addition of hydralazine/Imdur.   -No ARB/ACE due to worsening renal function  3.  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  He will follow-up with Dr. HilDebara Pickett determine whether he is a candidate for TAVR.  4.  CKD Stage IV/V: -Creatinine slightly improved today to 3.35 -Felt not to be a hemodialysis candidate per nephrology due to hypotension -Nephrology following  5. DNR: -Palliative care consulted for goals of care discussion -Pt made DNR   6.  ASCAD -He has not had any anginal symptoms. -Continue Plavix 75 mg daily.  No aspirin due to warfarin.  Continue Repatha. -Ranexa stopped due to addition of amiodarone.    -Ranexa known to increase amiodarone levels and possibly prolonged QTC  Patient stable from a cardiac standpoint for discharge home with 24-hour care.  His daughter has already made him an appointment next week with Dr. HilDebara PickettI have spent a total of 35 minutes with patient reviewing  telemetry, EKGs, labs and examining patient as well as establishing an assessment and plan that was discussed with the patient.  > 50% of time was spent  in direct patient care.    [facility] will sign off.   Medication Recommendations: Amiodarone 200 mg twice daily, Plavix 75 mg daily, Lasix 80 mg every morning and 40 mg every afternoon, Lopressor 50 mg twice daily, warfarin per pharmacy other recommendations (labs, testing, etc):  EKG and BMET in 1 week Follow up as an outpatient:  HaoAlmyra DeforestA 01/19/2018 - daughter has made appt  Signed, [facility] Pager: [phone] 8454/31/2019, 9:02 AM     For questions or updates, please contact   Please consult [email or web address] for contact info under Cardiology/STEMI.

## 2018-01-10 NOTE — Progress Notes (Signed)
Patient and daughter received patient's discharge information and acknowledged understanding of it. Patient received DNR form. Patient IV was removed.

## 2018-01-10 NOTE — Care Management Important Message (Signed)
Important Message  Patient Details  Name: Greg Booker MRN: [id number] Date of Birth: [birth year]   Medicare Important Message Given:  Yes    [name] P Charese Abundis 01/10/2018, 4:17 PM

## 2018-01-10 NOTE — Progress Notes (Signed)
PMT follow-up with caregiver, [name]. Plan is for discharge today. She has already arranged outpatient follow-up appointments with cardiology and hematology. Encouraged [name] to contact PCP for outpatient palliative referral for ongoing support. Explained difference between outpatient palliative versus hospice services. Answered questions and concerns.   NO CHARGE  Ihor Dow, FNP-C Palliative Medicine Team  Phone: [phone] Fax: [phone]

## 2018-01-10 NOTE — Progress Notes (Signed)
ANTICOAGULATION CONSULT NOTE - Follow Up Consult  Pharmacy Consult for Coumadin Indication: atrial fibrillation  Allergies  Allergen Reactions  . Bicalutamide Other (See Comments)    Casodex caused increase in breast size 01/05/18: confirmed still taking this medication  . Codeine Itching and Rash  . Niacin And Related Other (See Comments)    Burning and stinging from tablets (otc capsules ok)  . Lovastatin Other (See Comments)    Leg weakness  . Morphine Other (See Comments)    "Allergic," per MAR  . Beef Extract Rash  . Pravastatin Other (See Comments)    Myalgia    Patient Measurements: Height: 5\' 9"  (175.3 cm) Weight: 155 lb 3.3 oz (70.4 kg) IBW/kg (Calculated) : 70.7  Vital Signs: Temp: 98 F (36.7 C) (12/31 0917) Temp Source: Oral (12/31 0917) BP: 93/50 (12/31 0917) Pulse Rate: 45 (12/31 0917)  Labs: Recent Labs    01/08/18 0457 01/09/18 0352 01/10/18 0357  HGB 8.2* 8.4* 7.3*  HCT 24.8* 25.4* 22.9*  LABPROT 25.1* 25.7* 25.4*  INR 2.31 2.38 2.35  CREATININE 3.23* 3.38* 3.35*    Estimated Creatinine Clearance: 15.8 mL/min (A) (by C-G formula based on SCr of 3.35 mg/dL (H)).  Assessment:  84 yr old male admitted 12/24 with fluid overload.  To continue Coumadin as prior to admission for atrial fibrillation and hx DVT (2013?).     Caregiver  reported 12/24 most recent Coumadin regimen is 2.5 mg daily except 5 mg on Tuesdays.  She also reports that Coumadin has been held x 2 days d/t INR 4.1 on 12/22 and 3.8 on 12/23 at [facility]. INR 2.8 at Cardiology office on 12/24.  Hx anemia. Aranesp 200 mg SQ weekly begun on 12/25.   INR remains therapeutic at 2.35, CBC low but stable, no bleeding reported at this time.  Amiodarone added 12/27.  Goal of Therapy:  INR 2-3 Monitor platelets by anticoagulation protocol: Yes   Plan:  Warfarin 2.5mg  x 1 tonight Daily INR, s/s bleeding  Bertis Ruddy, PharmD Clinical Pharmacist Please check AMION for all [place]  numbers 01/10/2018 9:22 AM

## 2018-01-10 NOTE — Discharge Instructions (Signed)
Heart Failure °Heart failure is a condition in which the heart has trouble pumping blood because it has become weak or stiff. This means that the heart does not pump blood efficiently for the body to work well. For some people with heart failure, fluid may back up into the lungs and there may be swelling (edema) in the lower legs. Heart failure is usually a long-term (chronic) condition. It is important for you to take good care of yourself and follow the treatment plan from your health care provider. °What are the causes? °This condition is caused by some health problems, including: °· High blood pressure (hypertension). Hypertension causes the heart muscle to work harder than normal. High blood pressure eventually causes the heart to become stiff and weak. °· Coronary artery disease (CAD). CAD is the buildup of cholesterol and fat (plaques) in the arteries of the heart. °· Heart attack (myocardial infarction). Injured tissue, which is caused by the heart attack, does not contract as well and the heart's ability to pump blood is weakened. °· Abnormal heart valves. When the heart valves do not open and close properly, the heart muscle must pump harder to keep the blood flowing. °· Heart muscle disease (cardiomyopathy or myocarditis). Heart muscle disease is damage to the heart muscle from a variety of causes, such as drug or alcohol abuse, infections, or unknown causes. These can increase the risk of heart failure. °· Lung disease. When the lungs do not work properly, the heart must work harder. °What increases the risk? °Risk of heart failure increases as a person ages. This condition is also more likely to develop in people who: °· Are overweight. °· Are male. °· Smoke or chew tobacco. °· Abuse alcohol or illegal drugs. °· Have taken medicines that can damage the heart, such as chemotherapy drugs. °· Have diabetes. °? High blood sugar (glucose) is associated with high fat (lipid) levels in the blood. °? Diabetes  can also damage tiny blood vessels that carry nutrients to the heart muscle. °· Have abnormal heart rhythms. °· Have thyroid problems. °· Have low blood counts (anemia). °What are the signs or symptoms? °Symptoms of this condition include: °· Shortness of breath with activity, such as when climbing stairs. °· Persistent cough. °· Swelling of the feet, ankles, legs, or abdomen. °· Unexplained weight gain. °· Difficulty breathing when lying flat (orthopnea). °· Waking from sleep because of the need to sit up and get more air. °· Rapid heartbeat. °· Fatigue and loss of energy. °· Feeling light-headed, dizzy, or close to fainting. °· Loss of appetite. °· Nausea. °· Increased urination during the night (nocturia). °· Confusion. °How is this diagnosed? °This condition is diagnosed based on: °· Medical history, symptoms, and a physical exam. °· Diagnostic tests, which may include: °? Echocardiogram. °? Electrocardiogram (ECG). °? Chest X-ray. °? Blood tests. °? Exercise stress test. °? Radionuclide scans. °? Cardiac catheterization and angiogram. °How is this treated? °Treatment for this condition is aimed at managing the symptoms of heart failure. Medicines, behavioral changes, or other treatments may be necessary to treat heart failure. °Medicines °These may include: °· Angiotensin-converting enzyme (ACE) inhibitors. This type of medicine blocks the effects of a blood protein called angiotensin-converting enzyme. ACE inhibitors relax (dilate) the blood vessels and help to lower blood pressure. °· Angiotensin receptor blockers (ARBs). This type of medicine blocks the actions of a blood protein called angiotensin. ARBs dilate the blood vessels and help to lower blood pressure. °· Water pills (diuretics). Diuretics cause   the kidneys to remove salt and water from the blood. The extra fluid is removed through urination, leaving a lower volume of blood that the heart has to pump. °· Beta blockers. These improve heart muscle  strength and they prevent the heart from beating too quickly. °· Digoxin. This increases the force of the heartbeat. °Healthy behavior changes °These may include: °· Reaching and maintaining a healthy weight. °· Stopping smoking or chewing tobacco. °· Eating heart-healthy foods. °· Limiting or avoiding alcohol. °· Stopping use of street drugs (illegal drugs). °· Physical activity. °Other treatments °These may include: °· Surgery to open blocked coronary arteries or repair damaged heart valves. °· Placement of a biventricular pacemaker to improve heart muscle function (cardiac resynchronization therapy). This device paces both the right ventricle and left ventricle. °· Placement of a device to treat serious abnormal heart rhythms (implantable cardioverter defibrillator, or ICD). °· Placement of a device to improve the pumping ability of the heart (left ventricular assist device, or LVAD). °· Heart transplant. This can cure heart failure, and it is considered for certain patients who do not improve with other therapies. °Follow these instructions at home: °Medicines °· Take over-the-counter and prescription medicines only as told by your health care provider. Medicines are important in reducing the workload of your heart, slowing the progression of heart failure, and improving your symptoms. °? Do not stop taking your medicine unless your health care provider told you to do that. °? Do not skip any dose of medicine. °? Refill your prescriptions before you run out of medicine. You need your medicines every day. °Eating and drinking ° °· Eat heart-healthy foods. Talk with a dietitian to make an eating plan that is right for you. °? Choose foods that contain no trans fat and are low in saturated fat and cholesterol. Healthy choices include fresh or frozen fruits and vegetables, fish, lean meats, legumes, fat-free or low-fat dairy products, and whole-grain or high-fiber foods. °? Limit salt (sodium) if directed by your  health care provider. Sodium restriction may reduce symptoms of heart failure. Ask a dietitian to recommend heart-healthy seasonings. °? Use healthy cooking methods instead of frying. Healthy methods include roasting, grilling, broiling, baking, poaching, steaming, and stir-frying. °· Limit your fluid intake if directed by your health care provider. Fluid restriction may reduce symptoms of heart failure. °Lifestyle ° °· Stop smoking or using chewing tobacco. Nicotine and tobacco can damage your heart and your blood vessels. Do not use nicotine gum or patches before talking to your health care provider. °· Limit alcohol intake to no more than 1 drink per day for non-pregnant women and 2 drinks per day for men. One drink equals 12 oz of beer, 5 oz of wine, or 1½ oz of hard liquor. °? Drinking more than that is harmful to your heart. Tell your health care provider if you drink alcohol several times a week. °? Talk with your health care provider about whether any level of alcohol use is safe for you. °? If your heart has already been damaged by alcohol or you have severe heart failure, drinking alcohol should be stopped completely. °· Stop use of illegal drugs. °· Lose weight if directed by your health care provider. Weight loss may reduce symptoms of heart failure. °· Do moderate physical activity if directed by your health care provider. People who are elderly and people with severe heart failure should consult with a health care provider for physical activity recommendations. °Monitor important information ° °· Weigh   yourself every day. Keeping track of your weight daily helps you to notice excess fluid sooner. °? Weigh yourself every morning after you urinate and before you eat breakfast. °? Wear the same amount of clothing each time you weigh yourself. °? Record your daily weight. Provide your health care provider with your weight record. °· Monitor and record your blood pressure as told by your health care  provider. °· Check your pulse as told by your health care provider. °Dealing with extreme temperatures °· If the weather is extremely hot: °? Avoid vigorous physical activity. °? Use air conditioning or fans or seek a cooler location. °? Avoid caffeine and alcohol. °? Wear loose-fitting, lightweight, and light-colored clothing. °· If the weather is extremely cold: °? Avoid vigorous physical activity. °? Layer your clothes. °? Wear mittens or gloves, a hat, and a scarf when you go outside. °? Avoid alcohol. °General instructions °· Manage other health conditions such as hypertension, diabetes, thyroid disease, or abnormal heart rhythms as told by your health care provider. °· Learn to manage stress. If you need help to do this, ask your health care provider. °· Plan rest periods when fatigued. °· Get ongoing education and support as needed. °· Participate in or seek rehabilitation as needed to maintain or improve independence and quality of life. °· Stay up to date with immunizations. Keeping current on pneumococcal and influenza immunizations is especially important to prevent respiratory infections. °· Keep all follow-up visits as told by your health care provider. This is important. °Contact a health care provider if: °· You have a rapid weight gain. °· You have increasing shortness of breath that is unusual for you. °· You are unable to participate in your usual physical activities. °· You tire easily. °· You cough more than normal, especially with physical activity. °· You have any swelling or more swelling in areas such as your hands, feet, ankles, or abdomen. °· You are unable to sleep because it is hard to breathe. °· You feel like your heart is beating quickly (palpitations). °· You become dizzy or light-headed when you stand up. °Get help right away if: °· You have difficulty breathing. °· You notice or your family notices a change in your awareness, such as having trouble staying awake or having difficulty  with concentration. °· You have pain or discomfort in your chest. °· You have an episode of fainting (syncope). °This information is not intended to replace advice given to you by your health care provider. Make sure you discuss any questions you have with your health care provider. °Document Released: 12/28/2004 Document Revised: 11/26/2016 Document Reviewed: 07/23/2015 °Elsevier Interactive Patient Education © 2019 [employer] ° °

## 2018-01-11 LAB — BPAM RBC
BLOOD PRODUCT EXPIRATION DATE: 202001202359
Blood Product Expiration Date: 202001082359
ISSUE DATE / TIME: 201912281419
ISSUE DATE / TIME: 201912310854
UNIT TYPE AND RH: 600
Unit Type and Rh: 600

## 2018-01-11 LAB — TYPE AND SCREEN
ABO/RH(D): A NEG
Antibody Screen: NEGATIVE
Unit division: 0
Unit division: 0

## 2018-01-13 ENCOUNTER — Telehealth: Payer: Self-pay | Admitting: Internal Medicine

## 2018-01-13 NOTE — Telephone Encounter (Signed)
Orders for INR on Jan/6 given to [facility].   Medication DDI also clarify

## 2018-01-13 NOTE — Telephone Encounter (Signed)
New message    Thurmond Butts from  [facility] physical therapy calling to see if dr hilty can order home health nurse to check patients  Pt/inr. He is also concerned about interactions of theses medications and if its ok for him to take.   Warfarin, amiodarone , plavix, omeprazole , metoprolol

## 2018-01-16 ENCOUNTER — Ambulatory Visit (INDEPENDENT_AMBULATORY_CARE_PROVIDER_SITE_OTHER): Payer: Medicare Other | Admitting: Cardiovascular Disease

## 2018-01-16 ENCOUNTER — Telehealth: Payer: Self-pay | Admitting: Internal Medicine

## 2018-01-16 DIAGNOSIS — Z7901 Long term (current) use of anticoagulants: Secondary | ICD-10-CM | POA: Diagnosis not present

## 2018-01-16 DIAGNOSIS — I482 Chronic atrial fibrillation, unspecified: Secondary | ICD-10-CM | POA: Diagnosis not present

## 2018-01-16 LAB — POCT INR: INR: 1.8 — AB (ref 2.0–3.0)

## 2018-01-16 NOTE — Telephone Encounter (Signed)
Aware of DDI's - will adjust warfarin accordingly. Plavix/omeprazole interaction is not clinically significant as outlined extensively in the literature.  Appreciate anticoagulation clinic assistance.  Dr. Lemmie Evens

## 2018-01-16 NOTE — Progress Notes (Signed)
Cardiology Office Note   Date:  03/13/9922   ID:  Truett, Mcfarlan [birth year], MRN [id number]  PCP:  Raina Mina., MD  Cardiologist: Dr.  Debara Pickett Chief Complaint  Patient presents with  . Congestive Heart Failure  . Follow-up  . Atrial Fibrillation     History of Present Illness: Greg Booker is a 85 y.o. male who presents for post hospitalization follow up after admission from our office for acute on chronic diastolic CHF, acute renal failure, with known history of CAD with DES to the RCA in 12/2017. He was a patient of [facility].   He was seen by nephrology due to rising creatinine level of 4.07. Renal ultrasound did not show evidence of hydronephrosis. After diuresis with IV lasix 5.6 liters during admission  he was discharged on 80 mg in the am and 40 mg in afternoon. He was found to be anemic with Hgb of 7.0. he was started on darbepoetin. He is on amiodarone 200 mg BID and coumadin for atrial fib.  He is doing much better than seen last when I had to send him to the hospital. He has lost 20 lbs and is breathing better. He has seen oncology and blood was drawn today to check for anemia.  He is also seeing wound care center for cellulitis wounds and also with nephrology. He is tired from all of the appointments. He is falling asleep.  Past Medical History:  Diagnosis Date  . Anticoagulated on Coumadin   . Anxiety    "just in the last week" (01/03/2018)  . Arthritis   . Calcified granuloma of lung ([place]) RIGHT LUNG BASE - STABLE PER CXR 03-03-2010  . Chronic mid back pain    "fell and broke my back in ~ 2018" (01/03/2018)  . CKD (chronic kidney disease), stage III ([place])   . COPD (chronic obstructive pulmonary disease) ([place])   . Coronary artery disease    a. 1997 PCI/BMS to the RCA; b. 2006 Cath: Occluded RCA with collat flow, EF 60%, otw nonobs dzs; b. 2010 MV: No ischemia; c. 06/2013 MV: low risk w/o ischemia.  . Falls   . GERD (gastroesophageal reflux disease)   . Heart  murmur   . History of blood transfusion 12/2017   "related to anemia" (01/03/2018)  . History of DVT (deep vein thrombosis)    a. Early 2000's.; "that's why I lost my LLE" (01/03/2018)  . Hyperlipidemia   . Impaired hearing BILATERAL AIDS  . Low back pain radiating to left leg   . Mitral regurgitation   . Nocturia   . Numbness and tingling of left leg   . PAF (paroxysmal atrial fibrillation) (H[facility])    a. CHA2DS2VASc = 5-->chronic coumadin.  . Peripheral vascular disease of lower extremity with ulceration ([facility])    bil LE disease.  bypass on left, left AKA 02/2017.  disease with foot ulcers on right LE  . Pneumonia 01/2013; 01/03/2018  . Prostate cancer ([place]) RECUR   a. 2006 S/P radioactive seed implant.; S/P "froze it"  . Sciatic nerve pain LEFT LEG PAIN--  EPI INJECTIONS  . Type 2 diabetes, diet controlled ([place])   . Urgency of urination     Past Surgical History:  Procedure Laterality Date  . CARDIAC CATHETERIZATION  1998   IN-STENT RESTENOSIS-- LARGE COLLATERALS  . CARDIAC CATHETERIZATION  2003  &  2006   2006 ---  MITRAL REGURG/ 100% OCCULSION RCA WITH LARGE COLLATERALS/ MILD DISEASE LAD & CIRCUMFLEX  .  CARDIOVASCULAR STRESS TEST  06-03-2008   NORMAL STUDY/ LV NORMAL / COMPARED TO 12-26-2000 INFERIOR WALL ISCHEMIA IS NO LONGER PRESENT.  Marland Kitchen CARDIOVERSION  09-15-2005   UNSUCCESSFUL  . CATARACT EXTRACTION W/ INTRAOCULAR LENS  IMPLANT, BILATERAL Bilateral   . COLONOSCOPY W/ POLYPECTOMY  09/2017   Dr Mitchell Heir at [facility].  7 mm sessile polyp (serrated adenoma) grade 2 hemorrhoids. Normal distal TI.    Marland Kitchen CORONARY ANGIOPLASTY WITH STENT PLACEMENT  1997- DR AL LITTLE   X1 STENT RCA  . CORONARY ANGIOPLASTY WITH STENT PLACEMENT  12/2017   "[place]"  . CRYOABLATION  03/01/2011   Procedure: CRYO ABLATION PROSTATE;  Surgeon: Ailene Rud, MD;  Location: [facility];  Service: Urology;  Laterality: N/A;  . ESOPHAGOGASTRODUODENOSCOPY  09/2017   Dr Mirian Capuchin LE  in [facility].  mild, non-specific gastritis.    Marland Kitchen EXCISION BENIGN BREAST MASS  08-06-2008   LEFT  . INSERTION PROSTATE RADIATION SEED  08/03/2004  . LAMINECTOMY  10-28-2008   L3 - 4  . LEG AMPUTATION ABOVE KNEE Left 02/2017   unsalvagable limb despite thrombectomy, previous left fem bypass.      . LUMBAR LAMINECTOMY/DECOMPRESSION MICRODISCECTOMY Right 04/05/2013   Procedure: Right Lumbar three-four Laminectomy ;  Surgeon: Charlie Pitter, MD;  Location: [facility];  Service: Neurosurgery;  Laterality: Right;  . PROSTATE CRYOABLATION    . TOTAL HIP ARTHROPLASTY  11/02/2011   Procedure: TOTAL HIP ARTHROPLASTY;  Surgeon: Garald Balding, MD;  Location: [facility];  Service: Orthopedics;  Laterality: Left;  Left Total Hip Replacement  . TOTAL HIP ARTHROPLASTY Right 06/26/2013   Procedure: TOTAL HIP ARTHROPLASTY;  Surgeon: Garald Balding, MD;  Location: [facility];  Service: Orthopedics;  Laterality: Right;  . TRANSTHORACIC ECHOCARDIOGRAM  07-26-2007   LVSF NORMAL/ EF 55%/ LEFT ATRIUM 4.5CM MILD TO MOD. DILATED/ MILD  MITRAL & TRICUSPID REGURG     Current Outpatient Medications  Medication Sig Dispense Refill  . acetaminophen (TYLENOL) 500 MG tablet Take 500 mg by mouth every 6 (six) hours as needed (for pain).    Marland Kitchen albuterol (VENTOLIN HFA) 108 (90 Base) MCG/ACT inhaler Inhale 2 puffs into the lungs every 6 (six) hours as needed for wheezing.    Marland Kitchen alum & mag hydroxide-simeth (MYLANTA) 200-200-20 MG/5ML suspension Take 30 mLs by mouth every 4 (four) hours as needed for indigestion.    Marland Kitchen amiodarone (PACERONE) 200 MG tablet Take 1 tablet (200 mg total) by mouth 2 (two) times daily. 60 tablet 0  . B Complex Vitamins (VITAMIN B COMPLEX PO) Take 1 tablet by mouth daily.     . bicalutamide (CASODEX) 50 MG tablet Take 50 mg by mouth daily.    . cholecalciferol (VITAMIN D3) 25 MCG (1000 UT) tablet Take 2,000 Units by mouth daily.    . clopidogrel (PLAVIX) 75 MG tablet Take 75 mg by mouth daily.    .  Evolocumab with Infusor ([place]) 420 MG/3.5ML SOCT Inject 420 mg into the skin every 30 (thirty) days.    . furosemide (LASIX) 80 MG tablet Take 1 tablet (80 mg total) by mouth 2 (two) times daily. 60 tablet 12  . gabapentin (NEURONTIN) 100 MG capsule Take 100 mg by mouth at bedtime.     Marland Kitchen guaiFENesin (GERI-TUSSIN) 100 MG/5ML liquid Take 10 mLs (200 mg total) by mouth 4 (four) times daily as needed for up to 9 days (for expectorant). 120 mL 0  . ipratropium-albuterol (DUONEB)  0.5-2.5 (3) MG/3ML SOLN Take 3 mLs by nebulization See admin instructions. Nebulize 3 ml's four times a day and 3 ml's every two hours as needed for shortness of breath    . Melatonin 3 MG TABS Take 6 mg by mouth at bedtime.    . metoprolol tartrate (LOPRESSOR) 50 MG tablet Take 1 tablet (50 mg total) by mouth 2 (two) times daily. 60 tablet 0  . Multiple Vitamins-Minerals (MULTIVITAMIN WITH MINERALS) tablet Take 1 tablet by mouth daily.    . Multiple Vitamins-Minerals (PRESERVISION AREDS) CAPS Take 1 capsule by mouth daily.    . nitroGLYCERIN (NITROSTAT) 0.4 MG SL tablet Place 1 tablet (0.4 mg total) under the tongue every 5 (five) minutes as needed for chest pain. (Patient taking differently: Place 0.4 mg under the tongue every 5 (five) minutes x 3 doses as needed for chest pain. ) 25 tablet PRN  . omeprazole (PRILOSEC OTC) 20 MG tablet Take 20 mg by mouth daily before breakfast.    . polyethylene glycol powder (GLYCOLAX/MIRALAX) powder Take 17 g by mouth daily.    . ranitidine (ZANTAC) 150 MG tablet Take 150 mg by mouth daily.    Marland Kitchen senna (SENOKOT) 8.6 MG TABS tablet Take 2 tablets by mouth at bedtime.    . sodium bicarbonate 650 MG tablet Take 650 mg by mouth 2 (two) times daily.    . Tamsulosin HCl (FLOMAX) 0.4 MG CAPS Take 0.4 mg by mouth daily after supper.    . vitamin C (ASCORBIC ACID) 500 MG tablet Take 500 mg by mouth daily.    Marland Kitchen warfarin (COUMADIN) 2 MG tablet Take 2 mg by mouth daily.    Marland Kitchen  warfarin (COUMADIN) 5 MG tablet Take 1/2 to 1 tablet by mouth daily as directed 90 tablet 0  . Wheat Dextrin (BENEFIBER) POWD Take by mouth See admin instructions. Mix 2 teaspoonsful into a liquid of choice and drink at bedtime     No current facility-administered medications for this visit.     Allergies:   Bicalutamide; Codeine; Niacin and related; Lovastatin; Morphine; Beef extract; and Pravastatin    Social History:  The patient  reports that he quit smoking about 45 years ago. His smoking use included cigarettes and pipe. He has a 25.00 pack-year smoking history. He has never used smokeless tobacco. He reports previous alcohol use. He reports that he does not use drugs.   Family History:  The patient's family history includes Arthritis in his mother; Breast cancer in his sister; Cancer in his sister; Heart Problems in his brother and father; Heart disease in his brother; Other (age of onset: 13) in his mother.    ROS: All other systems are reviewed and negative. Unless otherwise mentioned in H&P    PHYSICAL EXAM: VS:  BP 92/60   Pulse (!) 51   Ht 5\' 9"  (1.753 m)   SpO2 (!) 84%   BMI 22.92 kg/m  , BMI Body mass index is 22.92 kg/m. GEN: Well nourished, well developed, in no acute distress HEENT: normal Neck: no JVD, carotid bruits, or masses Cardiac: IRRR tachycardic; 1/6 systolic murmurs, rubs, or gallops,no edema  Respiratory:  Clear to auscultation bilaterally, normal work of breathing GI: soft, nontender, nondistended, + BS MS: no deformity or atrophy.Left BKA. TED hose to the right leg with some weeping from pretibial site where skin has spilt open. No edema. Skin: warm and dry, no rash Neuro:  Strength and sensation are intact, falls asleep easily.  Psych: euthymic mood,  full affect,   EKG:  Not completed.  Recent Labs: 01/03/2018: B Natriuretic Peptide 1,394.2 01/07/2018: ALT 12; Platelets 267; TSH 4.817 01/10/2018: BUN 72; Creatinine, Ser 3.35; Hemoglobin 8.7;  Magnesium 2.5; Potassium 3.4; Sodium 138    Lipid Panel    Component Value Date/Time   CHOL 69 (L) 12/01/2016 1025   TRIG 64 12/01/2016 1025   HDL 32 (L) 12/01/2016 1025   CHOLHDL 2.2 12/01/2016 1025   LDLCALC 24 12/01/2016 1025      Wt Readings from Last 3 Encounters:  01/10/18 155 lb 3.3 oz (70.4 kg)  01/03/18 172 lb 4.8 oz (78.2 kg)  10/17/17 170 lb (77.1 kg)      Other studies Reviewed: Echocardiogram 15-Jan-2018 Left ventricle: The cavity size was normal. Systolic function was   moderately to severely reduced. The estimated ejection fraction   was in the range of 30% to 35%. Diffuse hypokinesis. Stroke   volume: 71 ml. Stroke volume/bsa: 38 ml/m^2. - Aortic valve: Valve mobility was moderately restricted. There was   trivial regurgitation. Mean gradient (S): 28 mm Hg. Valve area   (Vmax): 1 cm^2. - Mitral valve: Calcified annulus. There was trivial regurgitation. - Left atrium: The atrium was moderately dilated. - Right ventricle: Systolic function was mildly to moderately   reduced.   ASSESSMENT AND PLAN:  1.Atrial fib: Heart rate is elevated today at 102 apical auscultation. He has been going from appointment to appointment. He is on amiodarone and metoprolol. I will await labs for anemia evaluation. If anemic this may be causing the elevated HR. He has had to have blood transfusions in the past.   If he is not anemic, may have to reload amiodarone dose and continue 200 mg BID for a while longer. BP is soft but may not be accurate with atrial fib.  Daughter reports INR was 1.7. with coumadin being adjusted by PCP.  2. Acute on Chronic Systolic CHF:  Diuresed 20 lbs in hospital and is now on lasix 80 mg BID. Nephrology increased dose on office visit today.   3. CAD: DES to the RCA in 2019. He continues on Plavix but not on ASA due to coumadin.          4.CKD Stage III; Followed by nephrology. Last creatinine 3.35.   5. Anemia: Followed by hematology. May need  blood transfusion.   Current medicines are reviewed at length with the patient today.    Labs/ tests ordered today include:None   Phill Myron. [place] Pugh, ANP, AACC   01/17/2018 12:47 PM    [address] Office [phone] Fax [phone]

## 2018-01-16 NOTE — Telephone Encounter (Signed)
Returned call to care giver Greg Booker.She stated patient has increased swelling in right leg.Stated PCP increased Lasix to 80 mg twice a day this past Friday.Stated she cannot tell it has helped.No sob.No chest pain.No weight.Appointment scheduled with Jory Sims DNP 01/17/18 at 11:30 am.

## 2018-01-16 NOTE — Telephone Encounter (Signed)
New Message    Pt c/o swelling: STAT is pt has developed SOB within 24 hours  1) How much weight have you gained and in what time span? Hasn't been weighed  2) If swelling, where is the swelling located? Right Leg  3) Are you currently taking a fluid pill? Furosemide 80mg    4) Are you currently SOB? No   5) Do you have a log of your daily weights (if so, list)? No   6) Have you gained 3 pounds in a day or 5 pounds in a week? N/A   7) Have you traveled recently? No

## 2018-01-17 ENCOUNTER — Encounter: Payer: Self-pay | Admitting: Adult Health

## 2018-01-17 ENCOUNTER — Ambulatory Visit: Payer: Medicare Other | Admitting: Adult Health

## 2018-01-17 VITALS — BP 92/60 | HR 51 | Ht 69.0 in

## 2018-01-17 DIAGNOSIS — I5042 Chronic combined systolic (congestive) and diastolic (congestive) heart failure: Secondary | ICD-10-CM | POA: Diagnosis not present

## 2018-01-17 DIAGNOSIS — N183 Chronic kidney disease, stage 3 unspecified: Secondary | ICD-10-CM

## 2018-01-17 DIAGNOSIS — D631 Anemia in chronic kidney disease: Secondary | ICD-10-CM

## 2018-01-17 DIAGNOSIS — I2510 Atherosclerotic heart disease of native coronary artery without angina pectoris: Secondary | ICD-10-CM

## 2018-01-17 DIAGNOSIS — I482 Chronic atrial fibrillation, unspecified: Secondary | ICD-10-CM | POA: Diagnosis not present

## 2018-01-17 MED ORDER — FUROSEMIDE 80 MG PO TABS: 80.0000 mg | ORAL_TABLET | Freq: Two times a day (BID) | ORAL | 12 refills | Status: AC

## 2018-01-17 NOTE — Patient Instructions (Signed)
Follow-Up: You will need a follow up appointment in 1 months.  You may see Pixie Casino, MD Jory Sims, DNP, AACC or one of the following Advanced Practice Providers on your designated Care Team:   Almyra Deforest, PA-C  Fabian Sharp, [place]    Medication Instructions:  NO CHANGES- Your physician recommends that you continue on your current medications as directed. Please refer to the Current Medication list given to you today. If you need a refill on your cardiac medications before your next appointment, please call your pharmacy.  Labwork: When you have labs (blood work) and your tests are completely normal, you will receive your results ONLY by [place] (if you have MyChart) -OR- A paper copy in the mail.  At [facility], you and your health needs are our priority.  As part of our continuing mission to provide you with exceptional heart care, we have created designated Provider Care Teams.  These Care Teams include your primary Cardiologist (physician) and Advanced Practice Providers (APPs -  Physician Assistants and Nurse Practitioners) who all work together to provide you with the care you need, when you need it.  Thank you for choosing [facility] at [facility]!!

## 2018-01-19 ENCOUNTER — Ambulatory Visit: Payer: Medicare Other | Admitting: Physician Assistant

## 2018-01-20 ENCOUNTER — Telehealth: Payer: Self-pay | Admitting: Internal Medicine

## 2018-01-20 NOTE — Telephone Encounter (Signed)
New Message        Patient's daughter called to let Greg Booker know that the patient will be having a blood transfusion today.

## 2018-01-20 NOTE — Telephone Encounter (Signed)
Routed to NP and MD as Juluis Rainier

## 2018-01-22 NOTE — Telephone Encounter (Signed)
Thank you :)

## 2018-01-23 ENCOUNTER — Ambulatory Visit (INDEPENDENT_AMBULATORY_CARE_PROVIDER_SITE_OTHER): Payer: Medicare Other | Admitting: Cardiology

## 2018-01-23 DIAGNOSIS — Z7901 Long term (current) use of anticoagulants: Secondary | ICD-10-CM

## 2018-01-23 DIAGNOSIS — I482 Chronic atrial fibrillation, unspecified: Secondary | ICD-10-CM

## 2018-01-23 LAB — POCT INR: INR: 3.5 — AB (ref 2.0–3.0)

## 2018-01-23 NOTE — Patient Instructions (Signed)
Description   HOLD warfarin dose today ONLY, then resume at lowe dose of 2.5mg  daily. Repeat INR in 1 week at [facility].

## 2018-01-25 ENCOUNTER — Telehealth: Payer: Self-pay

## 2018-01-25 NOTE — Telephone Encounter (Signed)
Called patient, LVM advising Caregiver [name] to call back to discuss mychart message of swollen foot. Gave back number or mychart option to notify when to return call.

## 2018-01-25 NOTE — Telephone Encounter (Signed)
Called caregiver, with patient along side her. They both states his right foot is red and swollen, does note some swelling still even on the 80 mg lasix BID. Caregiver states they believe it to be vein and vascular related, patient does deny any chest pains, SOB, and no other swelling other than that one foot, BP has been controlled 124/67 most recent from today and HR 102. They have had issues in the past and wound care has been handling issues with his legs. I advised patient to contact [facility] to be evaluated and possible [place] testing. If patient was unable to get in quickly to call back, caregiver and patient verbalized understanding.

## 2018-01-31 ENCOUNTER — Ambulatory Visit (INDEPENDENT_AMBULATORY_CARE_PROVIDER_SITE_OTHER): Payer: Medicare Other | Admitting: *Deleted

## 2018-01-31 DIAGNOSIS — Z7901 Long term (current) use of anticoagulants: Secondary | ICD-10-CM | POA: Diagnosis not present

## 2018-01-31 DIAGNOSIS — I482 Chronic atrial fibrillation, unspecified: Secondary | ICD-10-CM

## 2018-01-31 LAB — POCT INR: INR: 8 — AB (ref 2.0–3.0)

## 2018-02-01 ENCOUNTER — Telehealth: Payer: Self-pay | Admitting: Pharmacist

## 2018-02-01 NOTE — Telephone Encounter (Signed)
Palliative care assessment completed. Hospice will complete assessment today.  Orders for INR on Friday already given to Hospice.

## 2018-02-03 ENCOUNTER — Telehealth: Payer: Self-pay | Admitting: Internal Medicine

## 2018-02-03 NOTE — Telephone Encounter (Signed)
Dr. Rocky Link has called to make [place] aware that the patient has elected Hospice benefits as of Tuesday 01/31/2018. They are now handling all of his care.   [facility] number is [phone] Dr. Rocky Link personal cell is [phone].

## 2018-02-03 NOTE — Telephone Encounter (Signed)
FYI. Thanks.

## 2018-03-03 ENCOUNTER — Ambulatory Visit: Payer: Medicare Other | Admitting: Internal Medicine

## 2018-03-12 DEATH — deceased
